# Patient Record
Sex: Female | Born: 1945 | Race: Black or African American | Hispanic: No | Smoking: Never smoker
Health system: Southern US, Community
[De-identification: ages and names within clinical notes are randomized; demographics above are authoritative.]

## PROBLEM LIST (undated history)

## (undated) DIAGNOSIS — B0089 Other herpesviral infection: Secondary | ICD-10-CM

## (undated) DIAGNOSIS — M2550 Pain in unspecified joint: Secondary | ICD-10-CM

## (undated) DIAGNOSIS — A044 Other intestinal Escherichia coli infections: Secondary | ICD-10-CM

## (undated) DIAGNOSIS — C50919 Malignant neoplasm of unspecified site of unspecified female breast: Secondary | ICD-10-CM

## (undated) DIAGNOSIS — E785 Hyperlipidemia, unspecified: Secondary | ICD-10-CM

## (undated) DIAGNOSIS — Z9289 Personal history of other medical treatment: Secondary | ICD-10-CM

## (undated) DIAGNOSIS — K219 Gastro-esophageal reflux disease without esophagitis: Secondary | ICD-10-CM

## (undated) DIAGNOSIS — I639 Cerebral infarction, unspecified: Secondary | ICD-10-CM

## (undated) DIAGNOSIS — IMO0002 Reserved for concepts with insufficient information to code with codable children: Secondary | ICD-10-CM

## (undated) DIAGNOSIS — G8929 Other chronic pain: Secondary | ICD-10-CM

## (undated) DIAGNOSIS — M889 Osteitis deformans of unspecified bone: Secondary | ICD-10-CM

## (undated) DIAGNOSIS — I10 Essential (primary) hypertension: Secondary | ICD-10-CM

## (undated) DIAGNOSIS — Z9889 Other specified postprocedural states: Secondary | ICD-10-CM

## (undated) DIAGNOSIS — M159 Polyosteoarthritis, unspecified: Secondary | ICD-10-CM

## (undated) DIAGNOSIS — IMO0001 Reserved for inherently not codable concepts without codable children: Secondary | ICD-10-CM

## (undated) DIAGNOSIS — Z923 Personal history of irradiation: Secondary | ICD-10-CM

## (undated) DIAGNOSIS — B009 Herpesviral infection, unspecified: Secondary | ICD-10-CM

## (undated) DIAGNOSIS — Z86010 Personal history of colonic polyps: Secondary | ICD-10-CM

## (undated) DIAGNOSIS — R112 Nausea with vomiting, unspecified: Secondary | ICD-10-CM

## (undated) DIAGNOSIS — N289 Disorder of kidney and ureter, unspecified: Secondary | ICD-10-CM

## (undated) DIAGNOSIS — E119 Type 2 diabetes mellitus without complications: Secondary | ICD-10-CM

## (undated) DIAGNOSIS — C9000 Multiple myeloma not having achieved remission: Secondary | ICD-10-CM

## (undated) DIAGNOSIS — E559 Vitamin D deficiency, unspecified: Secondary | ICD-10-CM

## (undated) DIAGNOSIS — M1990 Unspecified osteoarthritis, unspecified site: Secondary | ICD-10-CM

## (undated) DIAGNOSIS — D649 Anemia, unspecified: Secondary | ICD-10-CM

## (undated) DIAGNOSIS — Z8579 Personal history of other malignant neoplasms of lymphoid, hematopoietic and related tissues: Secondary | ICD-10-CM

## (undated) DIAGNOSIS — M25562 Pain in left knee: Secondary | ICD-10-CM

## (undated) HISTORY — DX: Herpesviral infection, unspecified: B00.9

## (undated) HISTORY — DX: Malignant neoplasm of unspecified site of unspecified female breast: C50.919

## (undated) HISTORY — DX: Cerebral infarction, unspecified: I63.9

## (undated) HISTORY — PX: COLONOSCOPY: SHX174

## (undated) HISTORY — PX: JOINT REPLACEMENT: SHX530

## (undated) HISTORY — DX: Multiple myeloma not having achieved remission: C90.00

## (undated) HISTORY — DX: Pain in left knee: M25.562

## (undated) HISTORY — DX: Disorder of kidney and ureter, unspecified: N28.9

## (undated) HISTORY — DX: Personal history of irradiation: Z92.3

## (undated) HISTORY — DX: Essential (primary) hypertension: I10

## (undated) HISTORY — DX: Polyosteoarthritis, unspecified: M15.9

## (undated) HISTORY — DX: Osteitis deformans of unspecified bone: M88.9

## (undated) HISTORY — DX: Gastro-esophageal reflux disease without esophagitis: K21.9

## (undated) HISTORY — DX: Other herpesviral infection: B00.89

## (undated) HISTORY — DX: Vitamin D deficiency, unspecified: E55.9

## (undated) HISTORY — DX: Personal history of other medical treatment: Z92.89

## (undated) HISTORY — DX: Pain in unspecified joint: M25.50

## (undated) HISTORY — DX: Personal history of other malignant neoplasms of lymphoid, hematopoietic and related tissues: Z85.79

## (undated) HISTORY — DX: Unspecified osteoarthritis, unspecified site: M19.90

## (undated) HISTORY — DX: Type 2 diabetes mellitus without complications: E11.9

## (undated) HISTORY — DX: Anemia, unspecified: D64.9

## (undated) HISTORY — DX: Other chronic pain: G89.29

## (undated) HISTORY — DX: Personal history of colonic polyps: Z86.010

## (undated) HISTORY — DX: Other intestinal Escherichia coli infections: A04.4

## (undated) HISTORY — DX: Hyperlipidemia, unspecified: E78.5

---

## 1997-11-02 ENCOUNTER — Emergency Department (HOSPITAL_COMMUNITY): Admission: EM | Admit: 1997-11-02 | Discharge: 1997-11-02 | Payer: Self-pay | Admitting: Emergency Medicine

## 1998-10-13 ENCOUNTER — Emergency Department (HOSPITAL_COMMUNITY): Admission: EM | Admit: 1998-10-13 | Discharge: 1998-10-13 | Payer: Self-pay | Admitting: Emergency Medicine

## 2000-02-13 ENCOUNTER — Other Ambulatory Visit: Admission: RE | Admit: 2000-02-13 | Discharge: 2000-02-13 | Payer: Self-pay | Admitting: Obstetrics and Gynecology

## 2003-05-23 DIAGNOSIS — I10 Essential (primary) hypertension: Secondary | ICD-10-CM

## 2003-05-23 HISTORY — DX: Essential (primary) hypertension: I10

## 2003-11-28 ENCOUNTER — Emergency Department (HOSPITAL_COMMUNITY): Admission: EM | Admit: 2003-11-28 | Discharge: 2003-11-28 | Payer: Self-pay

## 2004-01-23 ENCOUNTER — Emergency Department (HOSPITAL_COMMUNITY): Admission: EM | Admit: 2004-01-23 | Discharge: 2004-01-23 | Payer: Self-pay | Admitting: Emergency Medicine

## 2004-05-22 DIAGNOSIS — K208 Other esophagitis without bleeding: Secondary | ICD-10-CM

## 2004-05-22 DIAGNOSIS — B0089 Other herpesviral infection: Secondary | ICD-10-CM

## 2004-05-22 HISTORY — DX: Other herpesviral infection: B00.89

## 2004-05-22 HISTORY — DX: Other esophagitis without bleeding: K20.80

## 2004-09-01 ENCOUNTER — Inpatient Hospital Stay (HOSPITAL_COMMUNITY): Admission: EM | Admit: 2004-09-01 | Discharge: 2004-09-13 | Payer: Self-pay | Admitting: Emergency Medicine

## 2004-09-02 ENCOUNTER — Encounter (INDEPENDENT_AMBULATORY_CARE_PROVIDER_SITE_OTHER): Payer: Self-pay | Admitting: Specialist

## 2004-09-02 ENCOUNTER — Ambulatory Visit: Payer: Self-pay | Admitting: Internal Medicine

## 2004-09-09 ENCOUNTER — Ambulatory Visit: Payer: Self-pay | Admitting: Hematology & Oncology

## 2004-09-09 ENCOUNTER — Encounter (INDEPENDENT_AMBULATORY_CARE_PROVIDER_SITE_OTHER): Payer: Self-pay | Admitting: *Deleted

## 2004-09-14 ENCOUNTER — Ambulatory Visit: Payer: Self-pay | Admitting: Hematology & Oncology

## 2004-09-24 ENCOUNTER — Encounter (INDEPENDENT_AMBULATORY_CARE_PROVIDER_SITE_OTHER): Payer: Self-pay | Admitting: Specialist

## 2004-09-24 ENCOUNTER — Inpatient Hospital Stay (HOSPITAL_COMMUNITY): Admission: EM | Admit: 2004-09-24 | Discharge: 2004-09-30 | Payer: Self-pay | Admitting: Emergency Medicine

## 2004-11-01 ENCOUNTER — Ambulatory Visit: Payer: Self-pay | Admitting: Hematology & Oncology

## 2004-12-05 ENCOUNTER — Emergency Department (HOSPITAL_COMMUNITY): Admission: EM | Admit: 2004-12-05 | Discharge: 2004-12-05 | Payer: Self-pay | Admitting: Emergency Medicine

## 2004-12-12 ENCOUNTER — Ambulatory Visit (HOSPITAL_COMMUNITY): Admission: RE | Admit: 2004-12-12 | Discharge: 2004-12-12 | Payer: Self-pay | Admitting: Hematology & Oncology

## 2004-12-19 ENCOUNTER — Ambulatory Visit: Payer: Self-pay | Admitting: Hematology & Oncology

## 2004-12-23 ENCOUNTER — Ambulatory Visit: Admission: RE | Admit: 2004-12-23 | Discharge: 2004-12-23 | Payer: Self-pay | Admitting: Hematology & Oncology

## 2004-12-24 ENCOUNTER — Emergency Department (HOSPITAL_COMMUNITY): Admission: EM | Admit: 2004-12-24 | Discharge: 2004-12-24 | Payer: Self-pay | Admitting: Emergency Medicine

## 2004-12-27 ENCOUNTER — Encounter (INDEPENDENT_AMBULATORY_CARE_PROVIDER_SITE_OTHER): Payer: Self-pay | Admitting: *Deleted

## 2004-12-27 ENCOUNTER — Ambulatory Visit (HOSPITAL_COMMUNITY): Admission: RE | Admit: 2004-12-27 | Discharge: 2004-12-27 | Payer: Self-pay | Admitting: Hematology & Oncology

## 2005-01-01 ENCOUNTER — Ambulatory Visit: Payer: Self-pay | Admitting: Hematology & Oncology

## 2005-01-20 HISTORY — PX: BONE MARROW TRANSPLANT: SHX200

## 2005-03-13 ENCOUNTER — Ambulatory Visit: Payer: Self-pay | Admitting: Hematology & Oncology

## 2005-03-14 ENCOUNTER — Inpatient Hospital Stay (HOSPITAL_COMMUNITY): Admission: EM | Admit: 2005-03-14 | Discharge: 2005-03-22 | Payer: Self-pay | Admitting: Oncology

## 2005-03-18 ENCOUNTER — Ambulatory Visit: Payer: Self-pay | Admitting: Oncology

## 2005-04-28 ENCOUNTER — Ambulatory Visit: Payer: Self-pay | Admitting: Hematology & Oncology

## 2005-05-02 ENCOUNTER — Ambulatory Visit: Payer: Self-pay | Admitting: Hematology & Oncology

## 2005-05-02 ENCOUNTER — Encounter (INDEPENDENT_AMBULATORY_CARE_PROVIDER_SITE_OTHER): Payer: Self-pay | Admitting: *Deleted

## 2005-05-02 ENCOUNTER — Ambulatory Visit (HOSPITAL_COMMUNITY): Admission: RE | Admit: 2005-05-02 | Discharge: 2005-05-02 | Payer: Self-pay | Admitting: Hematology & Oncology

## 2005-07-05 ENCOUNTER — Ambulatory Visit: Payer: Self-pay | Admitting: Hematology & Oncology

## 2005-08-30 ENCOUNTER — Ambulatory Visit: Payer: Self-pay | Admitting: Hematology & Oncology

## 2005-08-31 ENCOUNTER — Ambulatory Visit (HOSPITAL_COMMUNITY): Admission: RE | Admit: 2005-08-31 | Discharge: 2005-08-31 | Payer: Self-pay | Admitting: Hematology & Oncology

## 2005-08-31 LAB — CBC WITH DIFFERENTIAL/PLATELET
BASO%: 0.1 % (ref 0.0–2.0)
EOS%: 2.9 % (ref 0.0–7.0)
LYMPH%: 21.1 % (ref 14.0–48.0)
MCHC: 34.2 g/dL (ref 32.0–36.0)
MCV: 93.5 fL (ref 81.0–101.0)
MONO%: 9.2 % (ref 0.0–13.0)
Platelets: 205 10*3/uL (ref 145–400)
RBC: 3.86 10*6/uL (ref 3.70–5.32)
RDW: 13.5 % (ref 11.3–14.5)
WBC: 3.9 10*3/uL (ref 3.9–10.0)

## 2005-09-01 LAB — COMPREHENSIVE METABOLIC PANEL
ALT: 46 U/L — ABNORMAL HIGH (ref 0–40)
AST: 37 U/L (ref 0–37)
Alkaline Phosphatase: 98 U/L (ref 39–117)
Sodium: 141 mEq/L (ref 135–145)
Total Bilirubin: 0.3 mg/dL (ref 0.3–1.2)
Total Protein: 7.8 g/dL (ref 6.0–8.3)

## 2005-09-01 LAB — PROTEIN ELECTROPHORESIS, SERUM
Albumin ELP: 57.9 % (ref 55.8–66.1)
Alpha-1-Globulin: 4.9 % (ref 2.9–4.9)
Alpha-2-Globulin: 13.9 % — ABNORMAL HIGH (ref 7.1–11.8)
Beta 2: 4.4 % (ref 3.2–6.5)
Beta Globulin: 4.4 % — ABNORMAL LOW (ref 4.7–7.2)

## 2005-09-01 LAB — IGG, IGA, IGM: IgA: 124 mg/dL (ref 68–378)

## 2005-09-05 ENCOUNTER — Encounter (INDEPENDENT_AMBULATORY_CARE_PROVIDER_SITE_OTHER): Payer: Self-pay | Admitting: Cardiology

## 2005-09-05 ENCOUNTER — Ambulatory Visit: Admission: RE | Admit: 2005-09-05 | Discharge: 2005-09-05 | Payer: Self-pay | Admitting: Hematology & Oncology

## 2005-09-14 LAB — CBC WITH DIFFERENTIAL/PLATELET
Eosinophils Absolute: 0.2 10*3/uL (ref 0.0–0.5)
MONO#: 0.5 10*3/uL (ref 0.1–0.9)
NEUT#: 1.9 10*3/uL (ref 1.5–6.5)
RBC: 3.49 10*6/uL — ABNORMAL LOW (ref 3.70–5.32)
RDW: 14.1 % (ref 11.3–14.5)
WBC: 3.6 10*3/uL — ABNORMAL LOW (ref 3.9–10.0)

## 2005-09-22 LAB — CBC WITH DIFFERENTIAL/PLATELET
Basophils Absolute: 0.1 10*3/uL (ref 0.0–0.1)
EOS%: 4.5 % (ref 0.0–7.0)
Eosinophils Absolute: 0.2 10*3/uL (ref 0.0–0.5)
LYMPH%: 32.1 % (ref 14.0–48.0)
MCH: 32.4 pg (ref 26.0–34.0)
MCV: 95.6 fL (ref 81.0–101.0)
MONO%: 15 % — ABNORMAL HIGH (ref 0.0–13.0)
NEUT#: 2 10*3/uL (ref 1.5–6.5)
Platelets: 255 10*3/uL (ref 145–400)
RBC: 3.95 10*6/uL (ref 3.70–5.32)

## 2005-10-12 LAB — CBC WITH DIFFERENTIAL/PLATELET
BASO%: 0.8 % (ref 0.0–2.0)
EOS%: 5.4 % (ref 0.0–7.0)
LYMPH%: 29.2 % (ref 14.0–48.0)
MCHC: 33.6 g/dL (ref 32.0–36.0)
MCV: 94.7 fL (ref 81.0–101.0)
MONO%: 11.9 % (ref 0.0–13.0)
Platelets: 277 10*3/uL (ref 145–400)
RBC: 4.2 10*6/uL (ref 3.70–5.32)
WBC: 4.3 10*3/uL (ref 3.9–10.0)

## 2005-10-14 ENCOUNTER — Ambulatory Visit: Payer: Self-pay | Admitting: Hematology & Oncology

## 2005-10-17 LAB — COMPREHENSIVE METABOLIC PANEL
ALT: 20 U/L (ref 0–40)
AST: 23 U/L (ref 0–37)
Alkaline Phosphatase: 109 U/L (ref 39–117)
Sodium: 138 mEq/L (ref 135–145)
Total Bilirubin: 0.3 mg/dL (ref 0.3–1.2)
Total Protein: 8 g/dL (ref 6.0–8.3)

## 2005-10-17 LAB — PROTEIN ELECTROPHORESIS, SERUM
Albumin ELP: 58.9 % (ref 55.8–66.1)
M-Spike, %: 0.33 g/dL
Total Protein, Serum Electrophoresis: 8 g/dL (ref 6.0–8.3)

## 2005-10-17 LAB — KAPPA/LAMBDA LIGHT CHAINS
Kappa free light chain: 1.87 mg/dL (ref 0.33–1.94)
Kappa:Lambda Ratio: 1.89 — ABNORMAL HIGH (ref 0.26–1.65)
Lambda Free Lght Chn: 0.99 mg/dL (ref 0.57–2.63)

## 2005-10-17 LAB — FERRITIN: Ferritin: 430 ng/mL — ABNORMAL HIGH (ref 10–291)

## 2005-10-17 LAB — BETA 2 MICROGLOBULIN, SERUM: Beta-2 Microglobulin: 3.55 mg/L — ABNORMAL HIGH (ref 1.01–1.73)

## 2005-10-19 LAB — BASIC METABOLIC PANEL
BUN: 26 mg/dL — ABNORMAL HIGH (ref 6–23)
Calcium: 9.1 mg/dL (ref 8.4–10.5)
Creatinine, Ser: 1.5 mg/dL — ABNORMAL HIGH (ref 0.4–1.2)
Glucose, Bld: 106 mg/dL — ABNORMAL HIGH (ref 70–99)
Potassium: 3.6 mEq/L (ref 3.5–5.3)

## 2005-11-17 LAB — CBC WITH DIFFERENTIAL/PLATELET
Basophils Absolute: 0 10*3/uL (ref 0.0–0.1)
Eosinophils Absolute: 0.3 10*3/uL (ref 0.0–0.5)
HCT: 35.9 % (ref 34.8–46.6)
HGB: 12.3 g/dL (ref 11.6–15.9)
LYMPH%: 36.1 % (ref 14.0–48.0)
MCHC: 34.1 g/dL (ref 32.0–36.0)
MONO#: 0.8 10*3/uL (ref 0.1–0.9)
NEUT%: 39 % — ABNORMAL LOW (ref 39.6–76.8)
Platelets: 286 10*3/uL (ref 145–400)
WBC: 4.4 10*3/uL (ref 3.9–10.0)
lymph#: 1.6 10*3/uL (ref 0.9–3.3)

## 2005-11-20 LAB — PROTEIN ELECTROPHORESIS, SERUM
Albumin ELP: 57.8 % (ref 55.8–66.1)
Beta Globulin: 5.3 % (ref 4.7–7.2)
M-Spike, %: 0.26 g/dL
Total Protein, Serum Electrophoresis: 7.5 g/dL (ref 6.0–8.3)

## 2005-11-20 LAB — COMPREHENSIVE METABOLIC PANEL
AST: 17 U/L (ref 0–37)
Albumin: 4.5 g/dL (ref 3.5–5.2)
BUN: 23 mg/dL (ref 6–23)
CO2: 26 mEq/L (ref 19–32)
Calcium: 9.3 mg/dL (ref 8.4–10.5)
Chloride: 105 mEq/L (ref 96–112)
Creatinine, Ser: 1.45 mg/dL — ABNORMAL HIGH (ref 0.40–1.20)
Glucose, Bld: 106 mg/dL — ABNORMAL HIGH (ref 70–99)
Potassium: 4.4 mEq/L (ref 3.5–5.3)

## 2005-11-20 LAB — BETA 2 MICROGLOBULIN, SERUM: Beta-2 Microglobulin: 3.02 mg/L — ABNORMAL HIGH (ref 1.01–1.73)

## 2005-11-20 LAB — FERRITIN: Ferritin: 401 ng/mL — ABNORMAL HIGH (ref 10–291)

## 2005-11-20 LAB — KAPPA/LAMBDA LIGHT CHAINS: Lambda Free Lght Chn: 1.05 mg/dL (ref 0.57–2.63)

## 2005-12-11 ENCOUNTER — Ambulatory Visit: Payer: Self-pay | Admitting: Hematology & Oncology

## 2005-12-14 LAB — CBC WITH DIFFERENTIAL/PLATELET
BASO%: 1.2 % (ref 0.0–2.0)
EOS%: 4.5 % (ref 0.0–7.0)
HCT: 34.4 % — ABNORMAL LOW (ref 34.8–46.6)
LYMPH%: 31.9 % (ref 14.0–48.0)
MCH: 31.6 pg (ref 26.0–34.0)
MCHC: 34.2 g/dL (ref 32.0–36.0)
MCV: 92.4 fL (ref 81.0–101.0)
MONO#: 0.8 10*3/uL (ref 0.1–0.9)
MONO%: 16.7 % — ABNORMAL HIGH (ref 0.0–13.0)
NEUT%: 45.7 % (ref 39.6–76.8)
Platelets: 268 10*3/uL (ref 145–400)

## 2006-01-11 ENCOUNTER — Ambulatory Visit: Payer: Self-pay | Admitting: Hematology & Oncology

## 2006-01-11 LAB — BASIC METABOLIC PANEL
BUN: 26 mg/dL — ABNORMAL HIGH (ref 6–23)
Calcium: 9.7 mg/dL (ref 8.4–10.5)
Potassium: 3.5 mEq/L (ref 3.5–5.3)
Sodium: 137 mEq/L (ref 135–145)

## 2006-02-08 LAB — BASIC METABOLIC PANEL
CO2: 27 mEq/L (ref 19–32)
Glucose, Bld: 111 mg/dL — ABNORMAL HIGH (ref 70–99)
Potassium: 3.9 mEq/L (ref 3.5–5.3)
Sodium: 140 mEq/L (ref 135–145)

## 2006-02-08 LAB — CBC WITH DIFFERENTIAL/PLATELET
BASO%: 1.3 % (ref 0.0–2.0)
Basophils Absolute: 0.1 10*3/uL (ref 0.0–0.1)
HCT: 33 % — ABNORMAL LOW (ref 34.8–46.6)
HGB: 11.2 g/dL — ABNORMAL LOW (ref 11.6–15.9)
LYMPH%: 32.6 % (ref 14.0–48.0)
MCHC: 34.1 g/dL (ref 32.0–36.0)
MONO#: 0.6 10*3/uL (ref 0.1–0.9)
NEUT%: 47.2 % (ref 39.6–76.8)
Platelets: 288 10*3/uL (ref 145–400)
WBC: 4.7 10*3/uL (ref 3.9–10.0)

## 2006-02-16 LAB — CBC WITH DIFFERENTIAL/PLATELET
BASO%: 0.1 % (ref 0.0–2.0)
EOS%: 4.4 % (ref 0.0–7.0)
HCT: 31.9 % — ABNORMAL LOW (ref 34.8–46.6)
LYMPH%: 28.3 % (ref 14.0–48.0)
MCH: 31.9 pg (ref 26.0–34.0)
MCHC: 34.3 g/dL (ref 32.0–36.0)
NEUT%: 57.1 % (ref 39.6–76.8)
Platelets: ADEQUATE 10*3/uL (ref 145–400)
RBC: 3.42 10*6/uL — ABNORMAL LOW (ref 3.70–5.32)
WBC: 4.3 10*3/uL (ref 3.9–10.0)
lymph#: 1.2 10*3/uL (ref 0.9–3.3)

## 2006-02-19 LAB — BETA 2 MICROGLOBULIN, SERUM: Beta-2 Microglobulin: 2.93 mg/L — ABNORMAL HIGH (ref 1.01–1.73)

## 2006-02-19 LAB — COMPREHENSIVE METABOLIC PANEL
ALT: 15 U/L (ref 0–40)
AST: 16 U/L (ref 0–37)
Alkaline Phosphatase: 64 U/L (ref 39–117)
BUN: 31 mg/dL — ABNORMAL HIGH (ref 6–23)
Creatinine, Ser: 1.86 mg/dL — ABNORMAL HIGH (ref 0.40–1.20)

## 2006-02-19 LAB — PROTEIN ELECTROPHORESIS, SERUM
Alpha-1-Globulin: 4.1 % (ref 2.9–4.9)
Alpha-2-Globulin: 11.7 % (ref 7.1–11.8)
Beta 2: 4.7 % (ref 3.2–6.5)
Beta Globulin: 5.3 % (ref 4.7–7.2)
Gamma Globulin: 14.7 % (ref 11.1–18.8)

## 2006-04-17 ENCOUNTER — Ambulatory Visit: Payer: Self-pay | Admitting: Hematology & Oncology

## 2006-04-20 LAB — CBC WITH DIFFERENTIAL/PLATELET
BASO%: 0.9 % (ref 0.0–2.0)
Eosinophils Absolute: 0.3 10*3/uL (ref 0.0–0.5)
LYMPH%: 28.5 % (ref 14.0–48.0)
MCHC: 34.4 g/dL (ref 32.0–36.0)
MCV: 92.6 fL (ref 81.0–101.0)
MONO%: 9.6 % (ref 0.0–13.0)
NEUT#: 2.7 10*3/uL (ref 1.5–6.5)
RBC: 3.62 10*6/uL — ABNORMAL LOW (ref 3.70–5.32)
RDW: 13.6 % (ref 11.3–14.5)
WBC: 4.9 10*3/uL (ref 3.9–10.0)

## 2006-04-25 LAB — COMPREHENSIVE METABOLIC PANEL
ALT: 27 U/L (ref 0–35)
AST: 22 U/L (ref 0–37)
Albumin: 4.6 g/dL (ref 3.5–5.2)
Alkaline Phosphatase: 93 U/L (ref 39–117)
Glucose, Bld: 109 mg/dL — ABNORMAL HIGH (ref 70–99)
Potassium: 3.8 mEq/L (ref 3.5–5.3)
Sodium: 141 mEq/L (ref 135–145)
Total Bilirubin: 0.3 mg/dL (ref 0.3–1.2)
Total Protein: 8.1 g/dL (ref 6.0–8.3)

## 2006-04-25 LAB — IGG, IGA, IGM: IgM, Serum: 64 mg/dL (ref 60–263)

## 2006-04-25 LAB — PROTEIN ELECTROPHORESIS, SERUM
Alpha-1-Globulin: 6 % — ABNORMAL HIGH (ref 2.9–4.9)
Alpha-2-Globulin: 14.7 % — ABNORMAL HIGH (ref 7.1–11.8)
Total Protein, Serum Electrophoresis: 8.1 g/dL (ref 6.0–8.3)

## 2006-04-25 LAB — KAPPA/LAMBDA LIGHT CHAINS
Kappa free light chain: 1.8 mg/dL (ref 0.33–1.94)
Lambda Free Lght Chn: 0.93 mg/dL (ref 0.57–2.63)

## 2006-07-17 ENCOUNTER — Ambulatory Visit: Payer: Self-pay | Admitting: Hematology & Oncology

## 2006-07-20 ENCOUNTER — Ambulatory Visit (HOSPITAL_COMMUNITY): Admission: RE | Admit: 2006-07-20 | Discharge: 2006-07-20 | Payer: Self-pay | Admitting: Hematology & Oncology

## 2006-07-20 LAB — CBC WITH DIFFERENTIAL/PLATELET
Eosinophils Absolute: 0.1 10*3/uL (ref 0.0–0.5)
HCT: 33.3 % — ABNORMAL LOW (ref 34.8–46.6)
HGB: 11.4 g/dL — ABNORMAL LOW (ref 11.6–15.9)
LYMPH%: 26.4 % (ref 14.0–48.0)
MONO#: 0.5 10*3/uL (ref 0.1–0.9)
NEUT#: 2.5 10*3/uL (ref 1.5–6.5)
NEUT%: 58.1 % (ref 39.6–76.8)
Platelets: 360 10*3/uL (ref 145–400)
WBC: 4.3 10*3/uL (ref 3.9–10.0)

## 2006-07-24 LAB — PROTEIN ELECTROPHORESIS, SERUM
Albumin ELP: 56.8 % (ref 55.8–66.1)
Beta 2: 5.1 % (ref 3.2–6.5)
M-Spike, %: 0.34 g/dL

## 2006-07-24 LAB — IGG, IGA, IGM
IgA: 174 mg/dL (ref 68–378)
IgG (Immunoglobin G), Serum: 1410 mg/dL (ref 694–1618)

## 2006-07-24 LAB — COMPREHENSIVE METABOLIC PANEL
CO2: 24 mEq/L (ref 19–32)
Creatinine, Ser: 1.36 mg/dL — ABNORMAL HIGH (ref 0.40–1.20)
Glucose, Bld: 98 mg/dL (ref 70–99)
Sodium: 141 mEq/L (ref 135–145)
Total Bilirubin: 0.3 mg/dL (ref 0.3–1.2)
Total Protein: 7.7 g/dL (ref 6.0–8.3)

## 2006-08-30 LAB — CBC WITH DIFFERENTIAL/PLATELET
Basophils Absolute: 0.1 10*3/uL (ref 0.0–0.1)
EOS%: 3.3 % (ref 0.0–7.0)
Eosinophils Absolute: 0.2 10*3/uL (ref 0.0–0.5)
LYMPH%: 25.1 % (ref 14.0–48.0)
MCH: 31.8 pg (ref 26.0–34.0)
MCV: 92.3 fL (ref 81.0–101.0)
MONO%: 10.1 % (ref 0.0–13.0)
NEUT#: 3.1 10*3/uL (ref 1.5–6.5)
Platelets: 311 10*3/uL (ref 145–400)
RBC: 3.73 10*6/uL (ref 3.70–5.32)

## 2006-08-30 LAB — BASIC METABOLIC PANEL
BUN: 24 mg/dL — ABNORMAL HIGH (ref 6–23)
Calcium: 9.1 mg/dL (ref 8.4–10.5)
Glucose, Bld: 92 mg/dL (ref 70–99)
Sodium: 139 mEq/L (ref 135–145)

## 2006-09-20 ENCOUNTER — Emergency Department (HOSPITAL_COMMUNITY): Admission: EM | Admit: 2006-09-20 | Discharge: 2006-09-20 | Payer: Self-pay | Admitting: Emergency Medicine

## 2006-09-21 ENCOUNTER — Ambulatory Visit: Payer: Self-pay | Admitting: Hematology & Oncology

## 2006-09-21 ENCOUNTER — Ambulatory Visit (HOSPITAL_COMMUNITY): Admission: RE | Admit: 2006-09-21 | Discharge: 2006-09-21 | Payer: Self-pay | Admitting: Hematology & Oncology

## 2006-09-21 LAB — CBC WITH DIFFERENTIAL/PLATELET
BASO%: 0.3 % (ref 0.0–2.0)
MCHC: 34.8 g/dL (ref 32.0–36.0)
MONO#: 0.5 10*3/uL (ref 0.1–0.9)
RBC: 4.06 10*6/uL (ref 3.70–5.32)
WBC: 6.2 10*3/uL (ref 3.9–10.0)
lymph#: 0.6 10*3/uL — ABNORMAL LOW (ref 0.9–3.3)

## 2006-09-24 ENCOUNTER — Ambulatory Visit: Admission: RE | Admit: 2006-09-24 | Discharge: 2006-10-19 | Payer: Self-pay | Admitting: *Deleted

## 2006-09-25 LAB — PROTEIN ELECTROPHORESIS, SERUM
Alpha-1-Globulin: 5.4 % — ABNORMAL HIGH (ref 2.9–4.9)
Alpha-2-Globulin: 13.5 % — ABNORMAL HIGH (ref 7.1–11.8)
Beta 2: 5.5 % (ref 3.2–6.5)
Gamma Globulin: 13.7 % (ref 11.1–18.8)

## 2006-09-25 LAB — KAPPA/LAMBDA LIGHT CHAINS
Kappa:Lambda Ratio: 1.65 (ref 0.26–1.65)
Lambda Free Lght Chn: 1.01 mg/dL (ref 0.57–2.63)

## 2006-09-25 LAB — COMPREHENSIVE METABOLIC PANEL
ALT: 18 U/L (ref 0–35)
CO2: 26 mEq/L (ref 19–32)
Calcium: 9.8 mg/dL (ref 8.4–10.5)
Chloride: 105 mEq/L (ref 96–112)
Potassium: 4.2 mEq/L (ref 3.5–5.3)
Sodium: 142 mEq/L (ref 135–145)
Total Protein: 8 g/dL (ref 6.0–8.3)

## 2006-09-25 LAB — BETA 2 MICROGLOBULIN, SERUM: Beta-2 Microglobulin: 2.41 mg/L — ABNORMAL HIGH (ref 1.01–1.73)

## 2006-09-25 LAB — LACTATE DEHYDROGENASE: LDH: 186 U/L (ref 94–250)

## 2006-09-26 LAB — BASIC METABOLIC PANEL
CO2: 23 mEq/L (ref 19–32)
Calcium: 9.4 mg/dL (ref 8.4–10.5)
Sodium: 142 mEq/L (ref 135–145)

## 2006-10-02 ENCOUNTER — Ambulatory Visit: Payer: Self-pay | Admitting: Hematology & Oncology

## 2006-10-02 ENCOUNTER — Encounter: Payer: Self-pay | Admitting: Hematology & Oncology

## 2006-10-02 ENCOUNTER — Ambulatory Visit (HOSPITAL_COMMUNITY): Admission: RE | Admit: 2006-10-02 | Discharge: 2006-10-02 | Payer: Self-pay | Admitting: Hematology & Oncology

## 2006-10-12 LAB — CBC WITH DIFFERENTIAL/PLATELET
Basophils Absolute: 0 10*3/uL (ref 0.0–0.1)
EOS%: 4.4 % (ref 0.0–7.0)
Eosinophils Absolute: 0.2 10*3/uL (ref 0.0–0.5)
HCT: 35.1 % (ref 34.8–46.6)
HGB: 12.2 g/dL (ref 11.6–15.9)
MCH: 31.7 pg (ref 26.0–34.0)
MONO#: 0.2 10*3/uL (ref 0.1–0.9)
NEUT#: 3.3 10*3/uL (ref 1.5–6.5)
NEUT%: 73.8 % (ref 39.6–76.8)
lymph#: 0.8 10*3/uL — ABNORMAL LOW (ref 0.9–3.3)

## 2006-10-17 LAB — COMPREHENSIVE METABOLIC PANEL
ALT: 13 U/L (ref 0–35)
CO2: 23 mEq/L (ref 19–32)
Calcium: 9.7 mg/dL (ref 8.4–10.5)
Chloride: 106 mEq/L (ref 96–112)
Creatinine, Ser: 1.55 mg/dL — ABNORMAL HIGH (ref 0.40–1.20)
Total Protein: 7.8 g/dL (ref 6.0–8.3)

## 2006-10-17 LAB — PROTEIN ELECTROPHORESIS, SERUM
Albumin ELP: 57.8 % (ref 55.8–66.1)
Alpha-1-Globulin: 4.9 % (ref 2.9–4.9)
Alpha-2-Globulin: 12.9 % — ABNORMAL HIGH (ref 7.1–11.8)
Total Protein, Serum Electrophoresis: 7.8 g/dL (ref 6.0–8.3)

## 2006-10-17 LAB — KAPPA/LAMBDA LIGHT CHAINS: Lambda Free Lght Chn: 1.67 mg/dL (ref 0.57–2.63)

## 2006-10-17 LAB — LACTATE DEHYDROGENASE: LDH: 148 U/L (ref 94–250)

## 2006-10-17 LAB — IGG, IGA, IGM: IgM, Serum: 98 mg/dL (ref 60–263)

## 2006-10-24 LAB — BASIC METABOLIC PANEL
CO2: 23 mEq/L (ref 19–32)
Chloride: 105 mEq/L (ref 96–112)
Glucose, Bld: 92 mg/dL (ref 70–99)
Potassium: 4.3 mEq/L (ref 3.5–5.3)
Sodium: 140 mEq/L (ref 135–145)

## 2006-11-15 ENCOUNTER — Ambulatory Visit: Payer: Self-pay | Admitting: Hematology & Oncology

## 2006-11-21 LAB — BASIC METABOLIC PANEL
CO2: 27 mEq/L (ref 19–32)
Calcium: 9.4 mg/dL (ref 8.4–10.5)
Creatinine, Ser: 1.2 mg/dL (ref 0.40–1.20)
Glucose, Bld: 101 mg/dL — ABNORMAL HIGH (ref 70–99)

## 2006-12-19 LAB — BASIC METABOLIC PANEL
BUN: 23 mg/dL (ref 6–23)
Calcium: 8.7 mg/dL (ref 8.4–10.5)
Creatinine, Ser: 1.37 mg/dL — ABNORMAL HIGH (ref 0.40–1.20)

## 2007-01-13 ENCOUNTER — Ambulatory Visit: Payer: Self-pay | Admitting: Hematology & Oncology

## 2007-02-13 LAB — BASIC METABOLIC PANEL
Chloride: 107 mEq/L (ref 96–112)
Creatinine, Ser: 1.31 mg/dL — ABNORMAL HIGH (ref 0.40–1.20)
Potassium: 3.8 mEq/L (ref 3.5–5.3)

## 2007-03-11 ENCOUNTER — Ambulatory Visit: Payer: Self-pay | Admitting: Hematology & Oncology

## 2007-03-13 LAB — BASIC METABOLIC PANEL
BUN: 22 mg/dL (ref 6–23)
Chloride: 105 mEq/L (ref 96–112)
Glucose, Bld: 80 mg/dL (ref 70–99)
Potassium: 4.3 mEq/L (ref 3.5–5.3)
Sodium: 140 mEq/L (ref 135–145)

## 2007-04-10 LAB — COMPREHENSIVE METABOLIC PANEL
BUN: 16 mg/dL (ref 6–23)
CO2: 26 mEq/L (ref 19–32)
Creatinine, Ser: 1.88 mg/dL — ABNORMAL HIGH (ref 0.40–1.20)
Glucose, Bld: 109 mg/dL — ABNORMAL HIGH (ref 70–99)
Total Bilirubin: 0.5 mg/dL (ref 0.3–1.2)

## 2007-04-10 LAB — CBC WITH DIFFERENTIAL/PLATELET
BASO%: 0.1 % (ref 0.0–2.0)
Basophils Absolute: 0 10*3/uL (ref 0.0–0.1)
EOS%: 3.4 % (ref 0.0–7.0)
HGB: 12.1 g/dL (ref 11.6–15.9)
MCH: 31.8 pg (ref 26.0–34.0)
RDW: 14.5 % (ref 11.3–14.5)
WBC: 4.6 10*3/uL (ref 3.9–10.0)
lymph#: 0.8 10*3/uL — ABNORMAL LOW (ref 0.9–3.3)

## 2007-04-12 LAB — PROTEIN ELECTROPHORESIS, SERUM
Alpha-1-Globulin: 4.4 % (ref 2.9–4.9)
Gamma Globulin: 14.5 % (ref 11.1–18.8)

## 2007-04-12 LAB — IGG, IGA, IGM
IgA: 201 mg/dL (ref 68–378)
IgM, Serum: 70 mg/dL (ref 60–263)

## 2007-04-12 LAB — BETA 2 MICROGLOBULIN, SERUM: Beta-2 Microglobulin: 2.31 mg/L — ABNORMAL HIGH (ref 1.01–1.73)

## 2007-04-15 ENCOUNTER — Emergency Department (HOSPITAL_COMMUNITY): Admission: EM | Admit: 2007-04-15 | Discharge: 2007-04-16 | Payer: Self-pay | Admitting: Emergency Medicine

## 2007-05-06 ENCOUNTER — Ambulatory Visit: Payer: Self-pay | Admitting: Hematology & Oncology

## 2007-05-08 ENCOUNTER — Encounter: Admission: RE | Admit: 2007-05-08 | Discharge: 2007-05-08 | Payer: Self-pay | Admitting: Internal Medicine

## 2007-06-07 ENCOUNTER — Other Ambulatory Visit: Admission: RE | Admit: 2007-06-07 | Discharge: 2007-06-07 | Payer: Self-pay | Admitting: Obstetrics and Gynecology

## 2007-07-08 ENCOUNTER — Ambulatory Visit: Payer: Self-pay | Admitting: Hematology & Oncology

## 2007-07-10 LAB — COMPREHENSIVE METABOLIC PANEL
ALT: 15 U/L (ref 0–35)
Albumin: 3.8 g/dL (ref 3.5–5.2)
Alkaline Phosphatase: 78 U/L (ref 39–117)
CO2: 26 mEq/L (ref 19–32)
Glucose, Bld: 105 mg/dL — ABNORMAL HIGH (ref 70–99)
Potassium: 3.9 mEq/L (ref 3.5–5.3)
Sodium: 140 mEq/L (ref 135–145)
Total Protein: 7 g/dL (ref 6.0–8.3)

## 2007-07-10 LAB — CBC WITH DIFFERENTIAL/PLATELET
BASO%: 0.4 % (ref 0.0–2.0)
Eosinophils Absolute: 0.2 10*3/uL (ref 0.0–0.5)
HCT: 36.2 % (ref 34.8–46.6)
HGB: 11.3 g/dL — ABNORMAL LOW (ref 11.6–15.9)
MCV: 90.7 fL (ref 81.0–101.0)
MONO#: 0.5 10*3/uL (ref 0.1–0.9)
RBC: 3.98 10*6/uL (ref 3.70–5.32)
RDW: 14.7 % — ABNORMAL HIGH (ref 11.3–14.5)
lymph#: 1.1 10*3/uL (ref 0.9–3.3)

## 2007-07-12 LAB — PROTEIN ELECTROPHORESIS, SERUM
Albumin ELP: 57.1 % (ref 55.8–66.1)
Total Protein, Serum Electrophoresis: 7.7 g/dL (ref 6.0–8.3)

## 2007-07-12 LAB — IGG, IGA, IGM
IgA: 217 mg/dL (ref 68–378)
IgG (Immunoglobin G), Serum: 1100 mg/dL (ref 694–1618)
IgM, Serum: 66 mg/dL (ref 60–263)

## 2007-07-12 LAB — BETA 2 MICROGLOBULIN, SERUM: Beta-2 Microglobulin: 2.46 mg/L — ABNORMAL HIGH (ref 1.01–1.73)

## 2007-10-03 ENCOUNTER — Ambulatory Visit: Payer: Self-pay | Admitting: Hematology & Oncology

## 2007-10-07 LAB — CBC WITH DIFFERENTIAL/PLATELET
BASO%: 0.7 % (ref 0.0–2.0)
EOS%: 3.3 % (ref 0.0–7.0)
Eosinophils Absolute: 0.2 10*3/uL (ref 0.0–0.5)
LYMPH%: 22.4 % (ref 14.0–48.0)
MCH: 30.9 pg (ref 26.0–34.0)
MCHC: 33.3 g/dL (ref 32.0–36.0)
MCV: 92.7 fL (ref 81.0–101.0)
MONO%: 10.5 % (ref 0.0–13.0)
NEUT#: 3.6 10*3/uL (ref 1.5–6.5)
RBC: 3.88 10*6/uL (ref 3.70–5.32)
RDW: 13.5 % (ref 11.3–14.5)

## 2007-10-09 LAB — IGG, IGA, IGM
IgA: 238 mg/dL (ref 68–378)
IgG (Immunoglobin G), Serum: 1260 mg/dL (ref 694–1618)

## 2007-10-09 LAB — COMPREHENSIVE METABOLIC PANEL
ALT: 16 U/L (ref 0–35)
AST: 14 U/L (ref 0–37)
Albumin: 4.5 g/dL (ref 3.5–5.2)
Alkaline Phosphatase: 87 U/L (ref 39–117)
BUN: 18 mg/dL (ref 6–23)
Creatinine, Ser: 1.38 mg/dL — ABNORMAL HIGH (ref 0.40–1.20)
Potassium: 3.9 mEq/L (ref 3.5–5.3)

## 2007-10-09 LAB — PROTEIN ELECTROPHORESIS, SERUM
Albumin ELP: 56.7 % (ref 55.8–66.1)
Alpha-1-Globulin: 4.6 % (ref 2.9–4.9)
Beta 2: 5.9 % (ref 3.2–6.5)

## 2007-10-09 LAB — BETA 2 MICROGLOBULIN, SERUM: Beta-2 Microglobulin: 2.35 mg/L — ABNORMAL HIGH (ref 1.01–1.73)

## 2008-01-02 ENCOUNTER — Ambulatory Visit: Payer: Self-pay | Admitting: Hematology & Oncology

## 2008-01-06 LAB — CBC WITH DIFFERENTIAL (CANCER CENTER ONLY)
BASO%: 0.5 % (ref 0.0–2.0)
LYMPH#: 1.2 10*3/uL (ref 0.9–3.3)
MONO#: 0.6 10*3/uL (ref 0.1–0.9)
Platelets: 381 10*3/uL (ref 145–400)
RDW: 12.4 % (ref 10.5–14.6)
WBC: 4.7 10*3/uL (ref 3.9–10.0)

## 2008-01-08 LAB — COMPREHENSIVE METABOLIC PANEL
ALT: 17 U/L (ref 0–35)
Albumin: 4.4 g/dL (ref 3.5–5.2)
Alkaline Phosphatase: 91 U/L (ref 39–117)
Glucose, Bld: 108 mg/dL — ABNORMAL HIGH (ref 70–99)
Potassium: 4.7 mEq/L (ref 3.5–5.3)
Sodium: 143 mEq/L (ref 135–145)
Total Protein: 7.7 g/dL (ref 6.0–8.3)

## 2008-01-08 LAB — KAPPA/LAMBDA LIGHT CHAINS
Kappa free light chain: 0.87 mg/dL (ref 0.33–1.94)
Kappa:Lambda Ratio: 0.66 (ref 0.26–1.65)

## 2008-01-08 LAB — IGG, IGA, IGM
IgA: 208 mg/dL (ref 68–378)
IgG (Immunoglobin G), Serum: 1280 mg/dL (ref 694–1618)

## 2008-01-08 LAB — PROTEIN ELECTROPHORESIS, SERUM
Alpha-1-Globulin: 4.7 % (ref 2.9–4.9)
Beta 2: 6.1 % (ref 3.2–6.5)
Gamma Globulin: 14.5 % (ref 11.1–18.8)

## 2008-03-27 ENCOUNTER — Ambulatory Visit: Payer: Self-pay | Admitting: Hematology & Oncology

## 2008-03-30 LAB — CBC WITH DIFFERENTIAL (CANCER CENTER ONLY)
BASO#: 0 10*3/uL (ref 0.0–0.2)
EOS%: 3.5 % (ref 0.0–7.0)
Eosinophils Absolute: 0.2 10*3/uL (ref 0.0–0.5)
HGB: 11.9 g/dL (ref 11.6–15.9)
LYMPH#: 1.1 10*3/uL (ref 0.9–3.3)
MCH: 30.3 pg (ref 26.0–34.0)
MONO%: 8.2 % (ref 0.0–13.0)
NEUT#: 3 10*3/uL (ref 1.5–6.5)
Platelets: 403 10*3/uL — ABNORMAL HIGH (ref 145–400)
RBC: 3.94 10*6/uL (ref 3.70–5.32)

## 2008-04-01 LAB — COMPREHENSIVE METABOLIC PANEL
ALT: 21 U/L (ref 0–35)
BUN: 14 mg/dL (ref 6–23)
CO2: 25 mEq/L (ref 19–32)
Calcium: 9.3 mg/dL (ref 8.4–10.5)
Chloride: 103 mEq/L (ref 96–112)
Creatinine, Ser: 1.33 mg/dL — ABNORMAL HIGH (ref 0.40–1.20)

## 2008-04-01 LAB — PROTEIN ELECTROPHORESIS, SERUM
Alpha-1-Globulin: 4.7 % (ref 2.9–4.9)
Alpha-2-Globulin: 12.2 % — ABNORMAL HIGH (ref 7.1–11.8)
Beta 2: 6.3 % (ref 3.2–6.5)
Beta Globulin: 6.6 % (ref 4.7–7.2)
Gamma Globulin: 15.9 % (ref 11.1–18.8)

## 2008-04-01 LAB — IGG, IGA, IGM: IgG (Immunoglobin G), Serum: 1420 mg/dL (ref 694–1618)

## 2008-04-01 LAB — KAPPA/LAMBDA LIGHT CHAINS
Kappa free light chain: 2.09 mg/dL — ABNORMAL HIGH (ref 0.33–1.94)
Lambda Free Lght Chn: 1.89 mg/dL (ref 0.57–2.63)

## 2008-04-01 LAB — LACTATE DEHYDROGENASE: LDH: 153 U/L (ref 94–250)

## 2008-05-22 HISTORY — PX: KNEE ARTHROSCOPY: SHX127

## 2008-06-02 ENCOUNTER — Ambulatory Visit (HOSPITAL_BASED_OUTPATIENT_CLINIC_OR_DEPARTMENT_OTHER): Admission: RE | Admit: 2008-06-02 | Discharge: 2008-06-02 | Payer: Self-pay | Admitting: Orthopaedic Surgery

## 2008-06-26 ENCOUNTER — Ambulatory Visit: Payer: Self-pay | Admitting: Hematology & Oncology

## 2008-06-29 LAB — CBC WITH DIFFERENTIAL (CANCER CENTER ONLY)
BASO%: 0.5 % (ref 0.0–2.0)
EOS%: 3.5 % (ref 0.0–7.0)
LYMPH#: 1.3 10*3/uL (ref 0.9–3.3)
MCH: 30.6 pg (ref 26.0–34.0)
MCHC: 33.4 g/dL (ref 32.0–36.0)
MONO%: 9.7 % (ref 0.0–13.0)
NEUT#: 2.7 10*3/uL (ref 1.5–6.5)
Platelets: 335 10*3/uL (ref 145–400)
RDW: 13 % (ref 10.5–14.6)

## 2008-10-09 ENCOUNTER — Ambulatory Visit: Payer: Self-pay | Admitting: Hematology & Oncology

## 2008-10-12 LAB — CBC WITH DIFFERENTIAL (CANCER CENTER ONLY)
BASO#: 0 10*3/uL (ref 0.0–0.2)
BASO%: 0.9 % (ref 0.0–2.0)
EOS%: 4.1 % (ref 0.0–7.0)
HCT: 36.2 % (ref 34.8–46.6)
LYMPH#: 1.3 10*3/uL (ref 0.9–3.3)
LYMPH%: 27.9 % (ref 14.0–48.0)
MCH: 30.7 pg (ref 26.0–34.0)
MCHC: 33.7 g/dL (ref 32.0–36.0)
MCV: 91 fL (ref 81–101)
MONO%: 11.2 % (ref 0.0–13.0)
NEUT%: 55.9 % (ref 39.6–80.0)
RDW: 12.4 % (ref 10.5–14.6)

## 2008-10-16 LAB — PROTEIN ELECTROPHORESIS, SERUM
Beta 2: 4.3 % (ref 3.2–6.5)
Gamma Globulin: 16.2 % (ref 11.1–18.8)

## 2008-10-16 LAB — COMPREHENSIVE METABOLIC PANEL
AST: 17 U/L (ref 0–37)
Albumin: 4.3 g/dL (ref 3.5–5.2)
BUN: 20 mg/dL (ref 6–23)
CO2: 24 mEq/L (ref 19–32)
Calcium: 9.8 mg/dL (ref 8.4–10.5)
Chloride: 106 mEq/L (ref 96–112)
Creatinine, Ser: 1.39 mg/dL — ABNORMAL HIGH (ref 0.40–1.20)
Glucose, Bld: 96 mg/dL (ref 70–99)
Potassium: 4.5 mEq/L (ref 3.5–5.3)

## 2008-10-16 LAB — IGG, IGA, IGM
IgA: 224 mg/dL (ref 68–378)
IgG (Immunoglobin G), Serum: 1310 mg/dL (ref 694–1618)
IgM, Serum: 76 mg/dL (ref 60–263)

## 2009-01-08 ENCOUNTER — Ambulatory Visit: Payer: Self-pay | Admitting: Hematology & Oncology

## 2009-01-11 LAB — CBC WITH DIFFERENTIAL (CANCER CENTER ONLY)
BASO#: 0.1 10e3/uL (ref 0.0–0.2)
BASO%: 1 % (ref 0.0–2.0)
EOS%: 3.8 % (ref 0.0–7.0)
Eosinophils Absolute: 0.2 10e3/uL (ref 0.0–0.5)
HCT: 35.3 % (ref 34.8–46.6)
HGB: 12.1 g/dL (ref 11.6–15.9)
LYMPH#: 1.6 10e3/uL (ref 0.9–3.3)
LYMPH%: 32.9 % (ref 14.0–48.0)
MCH: 30.8 pg (ref 26.0–34.0)
MCHC: 34.1 g/dL (ref 32.0–36.0)
MCV: 90 fL (ref 81–101)
MONO#: 0.5 10e3/uL (ref 0.1–0.9)
MONO%: 10.4 % (ref 0.0–13.0)
NEUT#: 2.5 10e3/uL (ref 1.5–6.5)
NEUT%: 51.9 % (ref 39.6–80.0)
Platelets: 351 10e3/uL (ref 145–400)
RBC: 3.91 10e6/uL (ref 3.70–5.32)
RDW: 11.8 % (ref 10.5–14.6)
WBC: 4.8 10e3/uL (ref 3.9–10.0)

## 2009-01-13 LAB — SPEP & IFE WITH QIG
Albumin ELP: 52.7 % — ABNORMAL LOW (ref 55.8–66.1)
Alpha-1-Globulin: 8.6 % — ABNORMAL HIGH (ref 2.9–4.9)
Alpha-2-Globulin: 11.5 % (ref 7.1–11.8)
Beta 2: 5.3 % (ref 3.2–6.5)
Beta Globulin: 6.1 % (ref 4.7–7.2)
Total Protein, Serum Electrophoresis: 7.1 g/dL (ref 6.0–8.3)

## 2009-01-13 LAB — COMPREHENSIVE METABOLIC PANEL
Albumin: 4.2 g/dL (ref 3.5–5.2)
Alkaline Phosphatase: 92 U/L (ref 39–117)
BUN: 18 mg/dL (ref 6–23)
CO2: 23 mEq/L (ref 19–32)
Glucose, Bld: 100 mg/dL — ABNORMAL HIGH (ref 70–99)
Potassium: 4.2 mEq/L (ref 3.5–5.3)
Sodium: 142 mEq/L (ref 135–145)
Total Bilirubin: 0.3 mg/dL (ref 0.3–1.2)
Total Protein: 7.1 g/dL (ref 6.0–8.3)

## 2009-01-13 LAB — KAPPA/LAMBDA LIGHT CHAINS
Kappa free light chain: 0.94 mg/dL (ref 0.33–1.94)
Kappa:Lambda Ratio: 0.66 (ref 0.26–1.65)
Lambda Free Lght Chn: 1.42 mg/dL (ref 0.57–2.63)

## 2009-04-23 ENCOUNTER — Ambulatory Visit: Payer: Self-pay | Admitting: Hematology & Oncology

## 2009-04-30 LAB — CBC WITH DIFFERENTIAL (CANCER CENTER ONLY)
Eosinophils Absolute: 0.1 10*3/uL (ref 0.0–0.5)
HCT: 38.4 % (ref 34.8–46.6)
LYMPH%: 27.6 % (ref 14.0–48.0)
MCV: 90 fL (ref 81–101)
MONO#: 0.5 10*3/uL (ref 0.1–0.9)
NEUT%: 60.6 % (ref 39.6–80.0)
RBC: 4.29 10*6/uL (ref 3.70–5.32)
WBC: 5.4 10*3/uL (ref 3.9–10.0)

## 2009-05-04 LAB — COMPREHENSIVE METABOLIC PANEL
AST: 13 U/L (ref 0–37)
BUN: 16 mg/dL (ref 6–23)
CO2: 23 mEq/L (ref 19–32)
Calcium: 9.1 mg/dL (ref 8.4–10.5)
Chloride: 104 mEq/L (ref 96–112)
Creatinine, Ser: 1.38 mg/dL — ABNORMAL HIGH (ref 0.40–1.20)
Glucose, Bld: 126 mg/dL — ABNORMAL HIGH (ref 70–99)

## 2009-05-04 LAB — KAPPA/LAMBDA LIGHT CHAINS
Kappa free light chain: 0.84 mg/dL (ref 0.33–1.94)
Lambda Free Lght Chn: 1.17 mg/dL (ref 0.57–2.63)

## 2009-05-04 LAB — PROTEIN ELECTROPHORESIS, SERUM
Gamma Globulin: 14.4 % (ref 11.1–18.8)
Total Protein, Serum Electrophoresis: 7.6 g/dL (ref 6.0–8.3)

## 2009-05-04 LAB — IGG, IGA, IGM
IgA: 230 mg/dL (ref 68–378)
IgG (Immunoglobin G), Serum: 1260 mg/dL (ref 694–1618)
IgM, Serum: 82 mg/dL (ref 60–263)

## 2009-08-25 ENCOUNTER — Ambulatory Visit: Payer: Self-pay | Admitting: Hematology & Oncology

## 2009-08-27 LAB — CBC WITH DIFFERENTIAL (CANCER CENTER ONLY)
BASO#: 0 10*3/uL (ref 0.0–0.2)
Eosinophils Absolute: 0.2 10*3/uL (ref 0.0–0.5)
HCT: 38.6 % (ref 34.8–46.6)
LYMPH%: 28.4 % (ref 14.0–48.0)
MCH: 30.1 pg (ref 26.0–34.0)
MCV: 92 fL (ref 81–101)
MONO%: 9.1 % (ref 0.0–13.0)
NEUT%: 57.9 % (ref 39.6–80.0)
Platelets: 350 10*3/uL (ref 145–400)
RBC: 4.2 10*6/uL (ref 3.70–5.32)

## 2009-08-31 LAB — COMPREHENSIVE METABOLIC PANEL
ALT: 28 U/L (ref 0–35)
AST: 21 U/L (ref 0–37)
Albumin: 4 g/dL (ref 3.5–5.2)
CO2: 21 mEq/L (ref 19–32)
Calcium: 8.9 mg/dL (ref 8.4–10.5)
Chloride: 107 mEq/L (ref 96–112)
Potassium: 4.2 mEq/L (ref 3.5–5.3)
Total Protein: 6.6 g/dL (ref 6.0–8.3)

## 2009-08-31 LAB — PROTEIN ELECTROPHORESIS, SERUM
Alpha-1-Globulin: 11.8 % — ABNORMAL HIGH (ref 2.9–4.9)
Alpha-2-Globulin: 12.7 % — ABNORMAL HIGH (ref 7.1–11.8)
Beta 2: 4.3 % (ref 3.2–6.5)
Beta Globulin: 7.5 % — ABNORMAL HIGH (ref 4.7–7.2)
Gamma Globulin: 13.4 % (ref 11.1–18.8)

## 2009-08-31 LAB — BETA 2 MICROGLOBULIN, SERUM: Beta-2 Microglobulin: 2.05 mg/L — ABNORMAL HIGH (ref 1.01–1.73)

## 2009-08-31 LAB — IGG, IGA, IGM: IgG (Immunoglobin G), Serum: 1000 mg/dL (ref 694–1618)

## 2009-08-31 LAB — VITAMIN D 25 HYDROXY (VIT D DEFICIENCY, FRACTURES): Vit D, 25-Hydroxy: 37 ng/mL (ref 30–89)

## 2009-10-20 ENCOUNTER — Emergency Department (HOSPITAL_COMMUNITY): Admission: EM | Admit: 2009-10-20 | Discharge: 2009-10-20 | Payer: Self-pay | Admitting: Emergency Medicine

## 2009-12-30 ENCOUNTER — Ambulatory Visit: Payer: Self-pay | Admitting: Hematology & Oncology

## 2009-12-31 ENCOUNTER — Ambulatory Visit: Payer: Self-pay | Admitting: Radiology

## 2009-12-31 ENCOUNTER — Ambulatory Visit (HOSPITAL_BASED_OUTPATIENT_CLINIC_OR_DEPARTMENT_OTHER): Admission: RE | Admit: 2009-12-31 | Discharge: 2009-12-31 | Payer: Self-pay | Admitting: Hematology & Oncology

## 2009-12-31 LAB — CMP (CANCER CENTER ONLY)
Alkaline Phosphatase: 90 U/L — ABNORMAL HIGH (ref 26–84)
Glucose, Bld: 121 mg/dL — ABNORMAL HIGH (ref 73–118)
Sodium: 137 mEq/L (ref 128–145)
Total Bilirubin: 0.6 mg/dl (ref 0.20–1.60)
Total Protein: 8 g/dL (ref 6.4–8.1)

## 2009-12-31 LAB — CBC WITH DIFFERENTIAL (CANCER CENTER ONLY)
BASO#: 0 10*3/uL (ref 0.0–0.2)
EOS%: 1.9 % (ref 0.0–7.0)
HCT: 40.7 % (ref 34.8–46.6)
HGB: 13.6 g/dL (ref 11.6–15.9)
LYMPH%: 17.8 % (ref 14.0–48.0)
MCH: 30.9 pg (ref 26.0–34.0)
MCHC: 33.3 g/dL (ref 32.0–36.0)
MONO%: 8 % (ref 0.0–13.0)
NEUT%: 71.7 % (ref 39.6–80.0)

## 2010-01-04 LAB — SPEP & IFE WITH QIG
Alpha-2-Globulin: 14 % — ABNORMAL HIGH (ref 7.1–11.8)
Gamma Globulin: 12.9 % (ref 11.1–18.8)
IgG (Immunoglobin G), Serum: 1080 mg/dL (ref 694–1618)
IgM, Serum: 88 mg/dL (ref 60–263)

## 2010-01-04 LAB — VITAMIN D 25 HYDROXY (VIT D DEFICIENCY, FRACTURES): Vit D, 25-Hydroxy: 43 ng/mL (ref 30–89)

## 2010-04-28 ENCOUNTER — Ambulatory Visit: Payer: Self-pay | Admitting: Hematology & Oncology

## 2010-04-29 LAB — CBC WITH DIFFERENTIAL (CANCER CENTER ONLY)
BASO%: 0.9 % (ref 0.0–2.0)
HCT: 39.7 % (ref 34.8–46.6)
LYMPH#: 1.1 10*3/uL (ref 0.9–3.3)
MONO#: 0.6 10*3/uL (ref 0.1–0.9)
NEUT%: 69.9 % (ref 39.6–80.0)
RBC: 4.31 10*6/uL (ref 3.70–5.32)
RDW: 11.9 % (ref 10.5–14.6)
WBC: 6.8 10*3/uL (ref 3.9–10.0)

## 2010-06-12 ENCOUNTER — Encounter: Payer: Self-pay | Admitting: Hematology & Oncology

## 2010-08-08 LAB — POCT I-STAT, CHEM 8
Creatinine, Ser: 1.8 mg/dL — ABNORMAL HIGH (ref 0.4–1.2)
Glucose, Bld: 109 mg/dL — ABNORMAL HIGH (ref 70–99)
Hemoglobin: 12.9 g/dL (ref 12.0–15.0)
TCO2: 24 mmol/L (ref 0–100)

## 2010-08-08 LAB — URINALYSIS, ROUTINE W REFLEX MICROSCOPIC
Bilirubin Urine: NEGATIVE
Nitrite: NEGATIVE
Specific Gravity, Urine: 1.025 (ref 1.005–1.030)
pH: 5.5 (ref 5.0–8.0)

## 2010-08-08 LAB — URINE MICROSCOPIC-ADD ON

## 2010-09-02 ENCOUNTER — Other Ambulatory Visit: Payer: Self-pay | Admitting: Hematology & Oncology

## 2010-09-02 ENCOUNTER — Encounter (HOSPITAL_BASED_OUTPATIENT_CLINIC_OR_DEPARTMENT_OTHER): Payer: 59 | Admitting: Hematology & Oncology

## 2010-09-02 DIAGNOSIS — C9000 Multiple myeloma not having achieved remission: Secondary | ICD-10-CM

## 2010-09-02 DIAGNOSIS — Z9484 Stem cells transplant status: Secondary | ICD-10-CM

## 2010-09-02 LAB — CMP (CANCER CENTER ONLY)
AST: 24 U/L (ref 11–38)
BUN, Bld: 11 mg/dL (ref 7–22)
Calcium: 8.9 mg/dL (ref 8.0–10.3)
Chloride: 103 mEq/L (ref 98–108)
Creat: 1.1 mg/dl (ref 0.6–1.2)
Glucose, Bld: 121 mg/dL — ABNORMAL HIGH (ref 73–118)

## 2010-09-02 LAB — CBC WITH DIFFERENTIAL (CANCER CENTER ONLY)
BASO#: 0 10*3/uL (ref 0.0–0.2)
EOS%: 2.7 % (ref 0.0–7.0)
HCT: 39.9 % (ref 34.8–46.6)
HGB: 13.3 g/dL (ref 11.6–15.9)
LYMPH#: 1.6 10*3/uL (ref 0.9–3.3)
MCH: 30 pg (ref 26.0–34.0)
MCHC: 33.3 g/dL (ref 32.0–36.0)
NEUT%: 52.8 % (ref 39.6–80.0)

## 2010-09-05 LAB — BASIC METABOLIC PANEL
BUN: 14 mg/dL (ref 6–23)
Calcium: 9.7 mg/dL (ref 8.4–10.5)
GFR calc non Af Amer: 37 mL/min — ABNORMAL LOW (ref >60)
Glucose, Bld: 94 mg/dL (ref 70–99)

## 2010-09-06 LAB — SPEP & IFE WITH QIG
Alpha-1-Globulin: 4.6 % (ref 2.9–4.9)
Gamma Globulin: 15.1 % (ref 11.1–18.8)
IgA: 286 mg/dL (ref 68–378)
IgG (Immunoglobin G), Serum: 1190 mg/dL (ref 694–1618)
IgM, Serum: 92 mg/dL (ref 60–263)

## 2010-09-06 LAB — KAPPA/LAMBDA LIGHT CHAINS: Kappa:Lambda Ratio: 1.08 (ref 0.26–1.65)

## 2010-10-04 ENCOUNTER — Encounter (HOSPITAL_COMMUNITY)
Admission: RE | Admit: 2010-10-04 | Discharge: 2010-10-04 | Disposition: A | Discharge status: Home / Self Care | Payer: 59 | Source: Ambulatory Visit | Attending: Orthopaedic Surgery | Admitting: Orthopaedic Surgery

## 2010-10-04 ENCOUNTER — Ambulatory Visit (HOSPITAL_COMMUNITY)
Admission: RE | Admit: 2010-10-04 | Discharge: 2010-10-04 | Disposition: A | Discharge status: Home / Self Care | Payer: 59 | Source: Ambulatory Visit | Attending: Orthopaedic Surgery | Admitting: Orthopaedic Surgery

## 2010-10-04 ENCOUNTER — Other Ambulatory Visit (HOSPITAL_COMMUNITY): Payer: Self-pay | Admitting: Orthopaedic Surgery

## 2010-10-04 DIAGNOSIS — Z01811 Encounter for preprocedural respiratory examination: Secondary | ICD-10-CM | POA: Insufficient documentation

## 2010-10-04 DIAGNOSIS — M1712 Unilateral primary osteoarthritis, left knee: Secondary | ICD-10-CM

## 2010-10-04 DIAGNOSIS — I10 Essential (primary) hypertension: Secondary | ICD-10-CM | POA: Insufficient documentation

## 2010-10-04 DIAGNOSIS — Z01812 Encounter for preprocedural laboratory examination: Secondary | ICD-10-CM | POA: Insufficient documentation

## 2010-10-04 DIAGNOSIS — Z01818 Encounter for other preprocedural examination: Secondary | ICD-10-CM | POA: Insufficient documentation

## 2010-10-04 DIAGNOSIS — Z01810 Encounter for preprocedural cardiovascular examination: Secondary | ICD-10-CM | POA: Insufficient documentation

## 2010-10-04 DIAGNOSIS — C9000 Multiple myeloma not having achieved remission: Secondary | ICD-10-CM | POA: Insufficient documentation

## 2010-10-04 LAB — BASIC METABOLIC PANEL
CO2: 28 mEq/L (ref 19–32)
Calcium: 10.1 mg/dL (ref 8.4–10.5)
Creatinine, Ser: 1.27 mg/dL — ABNORMAL HIGH (ref 0.4–1.2)
GFR calc Af Amer: 52 mL/min — ABNORMAL LOW (ref >60)
GFR calc non Af Amer: 43 mL/min — ABNORMAL LOW (ref >60)
Sodium: 139 mEq/L (ref 135–145)

## 2010-10-04 LAB — CBC
MCHC: 33.6 g/dL (ref 30.0–36.0)
Platelets: 320 10*3/uL (ref 150–400)
RDW: 13.9 % (ref 11.5–15.5)
WBC: 6.8 10*3/uL (ref 4.0–10.5)

## 2010-10-04 LAB — APTT: aPTT: 35 seconds (ref 24–37)

## 2010-10-04 LAB — SURGICAL PCR SCREEN
MRSA, PCR: NEGATIVE
Staphylococcus aureus: POSITIVE — AB

## 2010-10-04 LAB — PROTIME-INR
INR: 0.91 (ref 0.00–1.49)
Prothrombin Time: 12.5 seconds (ref 11.6–15.2)

## 2010-10-04 LAB — ABO/RH: ABO/RH(D): AB NEG

## 2010-10-04 NOTE — Op Note (Signed)
NAMEAJANAY, [name]              ACCOUNT NO.:  [id number]   MEDICAL RECORD NO.:  [id number]          PATIENT TYPE:  OUT   LOCATION:  OMED                         FACILITY:  [facility]   PHYSICIAN:  Rose Phi. Myna Hidalgo, M.D. DATE OF BIRTH:  02-15-1946   DATE OF PROCEDURE:  10/02/2006  DATE OF DISCHARGE:                               OPERATIVE REPORT   PROCEDURE:  Left posterior iliac crest bone marrow biopsy and aspirate.   PHYSICIAN:  Rose Phi. Myna Hidalgo, M.D.   INDICATIONS FOR PROCEDURE:  [name] is brought to the short-stay  unit.  She had an IV placed into her right hand.  The patient was then  placed onto her right side.  She received a total of 5 mg of Versed for  IV sedation.  The left posterior iliac crest region was prepped and  draped in a sterile fashion and 10 mL of 2% lidocaine was infiltrated  under the skin, down to the periosteum with a #22 gauge spinal needle.  A #11 scalpel was used to make an incision through the skin.  Two bone  marrow aspirates were obtained without difficulty.   A second incision was made into the skin with a #11 scalpel.  A good  bone marrow biopsy core was obtained without difficulty.   Apparently the patient tolerated the procedure well.  There were no  complications.      Rose Phi. Myna Hidalgo, M.D.  Electronically Signed     PRE/MEDQ  D:  10/02/2006  T:  10/02/2006  Job:  [id number]

## 2010-10-04 NOTE — Op Note (Signed)
[name], [name]              ACCOUNT NO.:  [id number]   MEDICAL RECORD NO.:  [id number]          PATIENT TYPE:  AMB   LOCATION:  DSC                          FACILITY:  [facility]   PHYSICIAN:  Lubertha Basque. Dalldorf, M.D.DATE OF BIRTH:  11/27/1945   DATE OF PROCEDURE:  06/02/2008  DATE OF DISCHARGE:                               OPERATIVE REPORT   PREOPERATIVE DIAGNOSES:  1. Left knee degenerative joint disease.  2. Left knee torn medial meniscus.   POSTOPERATIVE DIAGNOSES:  1. Left knee degenerative joint disease.  2. Left knee torn medial meniscus.   PROCEDURES:  1. Left knee abrasion chondroplasty, patellofemoral.  2. Left knee partial medial meniscectomy.   ANESTHESIA:  Knee block and MAC.   ATTENDING SURGEON:  Lubertha Basque. Jerl Santos, MD   ASSISTANT:  Lindwood Qua, PA   INDICATIONS FOR PROCEDURE:  The patient is a 65 year old woman with a  long history of a painful left knee.  This has persisted despite oral  anti-inflammatories and injections.  She actually had an arthroscopy of  this knee about 10 or 12 years ago.  By MRI scan, she has a medial  meniscus tear.  There are also some degenerative changes, but on plain  film she seems to have fairly good maintenance of the joint space.  It  seemed reasonable to try an arthroscopy at this point as she has  continued pain at rest and with activities though I do not think she has  advanced degenerative change.  Informed operative consent was obtained  after discussion of possible complications of reaction to anesthesia and  infection.   SUMMARY/FINDINGS/PROCEDURE:  Under knee block and MAC, a left knee  arthroscopy was performed.  Suprapatellar pouch was benign while the  patellofemoral joint exhibited grade 4 change across the broad area of  the intertrochlear groove.  A thorough chondroplasty was done along with  abrasion to bleeding bone in some small areas.  The medial compartment  exhibited broad areas of grade 3 change  across the femur with small  areas of grade 4 change.  She did have a degenerative tear of the medial  meniscus addressed with about 10 or 15% partial medial meniscectomy.  The ACL was intact and the lateral compartment was relatively benign.   DESCRIPTION OF PROCEDURE:  The patient was taken to the operating suite  where knee block and MAC were applied.  She was positioned supine and  prepped and draped in normal sterile fashion.  After administration of  IV Kefzol, an arthroscopy of the left knee was performed through a total  of 2 portals.  Findings were as noted above and procedure consisted of  the partial medial meniscectomy done with basket and shaver followed by  abrasion chondroplasty of patellofemoral and standard chondroplasty  medial.  The knee was thoroughly irrigated followed by placement of  Marcaine with epinephrine and morphine plus some Depo-Medrol.  Adaptic  was applied followed by dry gauze and loose Ace wrap.  Estimated blood  loss and intraoperative fluids can be obtained from anesthesia records.   DISPOSITION:  The patient was taken to the  recovery room in stable  addition.  She was to go home same-day and follow up in the office  closely.  I will contact her by phone tonight.      [name], M.D.  Electronically Signed     PGD/MEDQ  D:  06/02/2008  T:  06/02/2008  Job:  [id number]

## 2010-10-07 NOTE — Consult Note (Signed)
[name], [name]              ACCOUNT NO.:  [id number]   MEDICAL RECORD NO.:  [id number]          PATIENT TYPE:  INP   LOCATION:  1316                         FACILITY:  [facility]   PHYSICIAN:  John C. Madilyn Fireman, M.D.    DATE OF BIRTH:  01/03/1946   DATE OF CONSULTATION:  03/16/2005  DATE OF DISCHARGE:                                   CONSULTATION   HISTORY OF PRESENT ILLNESS:  The patient is a 65 year old white female with  a history of multiple myeloma, who presents with shaking chills,  hypotension, weakness, and fever with temperature 100.3.  She has also had  dysphagia.  She was seen in May at the time of her original diagnosis with  odynophagia and had an EGD which showed severe exudative esophagitis with  cultures positive for both herpes simplex and candidiasis, and she was  treated with Diflucan and Acyclovir, respectively.  On admission here, she  was started on Diflucan and Acyclovir again 2 days ago, but her dysphagia  has not improved, and she has still been febrile despite treatment with the  2 medications above as well as cefepime.  Consultation is requested to  consider EGD to better assess the etiology of her odynophagia and status of  presumed esophagitis.  The patient states she does not have any true  odynophagia but feels that there is a presence or flap in her upper throat  that makes eating difficult.  She has able to take clear liquids and eat  some peaches today with some difficulty.  She has not had any nausea or  vomiting.   PAST MEDICAL HISTORY:  1.  Hypertension.  2.  Postherpetic neuralgia.  3.  Multiple myeloma.  4.  Osteoarthritis.  5.  Gastroesophageal reflux.   SURGERIES:  None.   FAMILY HISTORY:  Father died of lung cancer.  Sister had pancreatic cancer.  Brother had widespread cancer involving his liver.   SOCIAL HISTORY:  Negative for alcohol or tobacco use.   PHYSICAL EXAMINATION:  GENERAL:  Well-developed, well-nourished black female  in  mild distress when trying to swallowed.  A detailed exam was not done.   IMPRESSION:  Dysphagia/odynophagia in an immunosuppressed patient with  history of herpetic and candidal esophagitis in the past.   PLAN:  We will proceed with esophagoscopy tomorrow morning.  Further  recommendations to follow.           ______________________________  Everardo All Madilyn Fireman, M.D.     JCH/MEDQ  D:  03/16/2005  T:  03/16/2005  Job:  [id number]   cc:   Rose Phi. Myna Hidalgo, M.D.  Fax: [phone]   Bernette Redbird, M.D.  Fax: [phone]

## 2010-10-07 NOTE — Discharge Summary (Signed)
[name], [name]              ACCOUNT NO.:  [id number]   MEDICAL RECORD NO.:  [id number]          PATIENT TYPE:  INP   LOCATION:  1316                         FACILITY:  [facility]   PHYSICIAN:  [name], M.D. DATE OF BIRTH:  [birth year]   DATE OF ADMISSION:  03/14/2005  DATE OF DISCHARGE:  03/22/2005                                 DISCHARGE SUMMARY   DISCHARGE DIAGNOSES:  1.  Fever, status post autologous transplant for myeloma.  2.  Odynophagia.  3.  Weakness.  4.  Hypertension.  5.  Anemia.   CONDITION ON DISCHARGE:  Stable.   ACTIVITY:  As tolerated.   DIET:  No restrictions.   FOLLOW UP:  The patient will followup with Dr. Myna [name] in 2 weeks.   DISCHARGE MEDICATIONS:  1.  Prevacid 30 mg p.o. b.i.d.  2.  Neurontin 600 mg p.o. t.i.d.  3.  Clonidine 0.1 mg p.o. t.i.d.  4.  Lortab elixir 5/10 mL q.6 h p.r.n.  5.  Rozerem 8 mg p.o. q.h.s.   HOSPITAL COURSE:  [name] was admitted on the 24th. She had been  complaining of weakness. She was dehydrated. She underwent a stem cell  transplant at [facility] on February 10, 2005. She seemed to do fairly well.   She was at home. She has a lot of stress at home. Her husband and her kids  do not get along and this causes her a lot of stress.   She got to the point where she is not eating. She is having problems  swallowing.   She was admitted. GI saw her. She underwent endoscopy. The endoscopy showed  no evidence of esophagitis. She did undergo a modified barium swallow. She  had some mild dysmotility but this was not felt to be a problem. She did  have some gagging. It was felt that this was a conditioned response. It  only happed with food, not liquids.   She had a chest x-ray on admission which had a questionable infiltrate. She  was not neutropenic on admission. She was a little anemic. She did receive 2  units of packed red blood cells on the 28th. This seemed to perk her up  quite a bit. She seemed to improve  daily. Once we found that she did not  have any infection, we were able to get her off antibiotics. She apparently  did have enterococcus in her urine. This was treated adequately with IV  Unasyn.   She had C difficile, this was negative.   She really wanted to go home on the first. She felt she was able to go home.   She had blood work done on the day of discharge. This showed a white cell  count 5.1, hemoglobin 11.7, hematocrit 33.7, platelet count 201. Sodium 139,  potassium 3.8, BUN 4, creatinine 1.0 and glucose was 104. Albumin was 2.2.   She was placed on clonidine during the hospital stay because of some  problems with hypertension.      [name], M.D.  Electronically Signed     PRE/MEDQ  D:  03/22/2005  T:  03/23/2005  Job:  [id number]

## 2010-10-07 NOTE — H&P (Signed)
NAMELAWREN, [name]              ACCOUNT NO.:  [id number]   MEDICAL RECORD NO.:  [id number]          PATIENT TYPE:  INP   LOCATION:  1330                         FACILITY:  [facility]   PHYSICIAN:  Blenda Nicely. Shadad        DATE OF BIRTH:  [birth year]   DATE OF ADMISSION:  03/14/2005  DATE OF DISCHARGE:                                HISTORY & PHYSICAL   PRIMARY ONCOLOGIST:  Dr. Arlan Organ   REASON FOR ADMISSION:  Weakness, dehydration, and fever.   HISTORY OF PRESENT ILLNESS:  This is a 65 year old female native of  [place] who was recently diagnosed with multiple myeloma. Her illness  dates back to April 2004 where she was admitted with abdominal pain, chest  pain, and epistaxis. The patient found to be anemic, hemoglobin of 8.8, and  a creatinine of 2.9, a calcium of 12.8. Her total protein was 14, albumin  2.1. Her IgG was 8760, IgA was 44, and IgM 34. Her serum protein  electrophoresis revealed a monoclonal spike of 3.56, 3, and 0.7 IgG kappa  light. A 24-hour urine collection of urine showed a total of 256 mg protein,  IgG kappa light chain. Bone marrow performed on April 21 showed a 33% plasma  cell with normal cytogenetics. Bone survey showed multiple lytic lesions in  the skull suggestive of myeloma. The patient was treated initially with IV  fluids, IV Zometa, and packed red cell transfusion, and subsequently  received four cycles of Decadron, Velcade, and Doxil. Course complicated by  candida esophagitis, Herpes zoster infection, and peripheral neuropathy. The  patient had a repeat marrow on August 8 which showed a 2-3% plasma cell  concentration. The patient was referred to [facility] for autologous stem cell  transplantation. She underwent stem cell collection, a total of 4.6 x  1,000,000 CD34 count per kilogram. She tolerated that procedure well and was  admitted for stem cell transplantation. Her day #0 was September 22 and her  conditioning regimen included melphalan. The  patient tolerated transplant  fairly well and had normal recovery of her counts. However, the patient  called in today to our office reporting weakness, fever, and inability to  take p.o. Upon evaluation in the clinic, the patient had some shaking  chills, had hypotension and overall weakness.   REVIEW OF SYSTEMS:  She did report chills but she did not have any  subjective fever. She did not have any cough, hemoptysis, or hematemesis.  Did not report any diarrhea. Some occasional constipation. Did have a  problem with dysphagia as well as ulcer oral pain. She did not have any  bleeding disorder. She did not have any difficulty breathing, did not have  any abdominal pain. The rest of review of systems is unremarkable.   PAST MEDICAL HISTORY:  1.  Hypertension.  2.  Postherpetic neuralgia with gabapentin being used.  3.  History of osteoarthritis.  4.  GERD.   FAMILY HISTORY:  Unremarkable.   SOCIAL HISTORY:  Negative for tobacco, alcohol, or any exposure to any  toxins.   FAMILY HISTORY:  Father died of lung  cancer, sister had pancreatic cancer,  and brother some form of other cancer that metastasized to his liver.   PHYSICAL EXAMINATION:  GENERAL:  Alert, awake female, appeared acutely ill.  Appeared in mild distress.  VITAL SIGNS:  Her blood pressure is 125/85, pulse was 119, respirations 24,  temperature 100.3.  HEENT:  Head is normocephalic, atraumatic. Pupils equal and round, reactive  to light. Mucous membranes moist and pink. Neck supple, no lymphadenopathy.  Examination of oral mucosa showed a lesion on her hard palate that looks  herpetic.  ABDOMEN:  Soft, nontender. No hepatosplenomegaly.  EXTREMITIES:  No clubbing, cyanosis, or edema.  NEUROLOGIC:  Intact.   Her blood work showed white cells of 7.6, hemoglobin 11.3, and absolute  neutrophil count of 5200.   ASSESSMENT AND PLAN:  This is a 65 year old female with IgA kappa myeloma  status post recent autologous  stem cell transplant utilizing melphalan as a  conditioning regimen, with recent recovery of her counts, and was recently  discharged. Now presenting with fevers, chills, dysphagia, and overall  weakness. The differential diagnosis is she certainly could have an acute  bacterial infection, but other infectious process to be considered such as  herpes esophagitis or CMV, although it is less likely given the fact she has  autologous transplant, but that is certainly a possibility. A fungal  infection is also a possibility.   So, my plan will be at this point, I will initiate IV fluids, do panculture,  do a chest x-ray. I will initiate antibacterial/antifungal/antiviral  treatments with cefepime, acyclovir, as well as Diflucan. Initiate IV fluids  and pain control and nausea control, and follow her very closely. I will  hold off on her blood pressure medication, given the fact that she is on the  tachycardic and the dehydrated side. However, will keep as close eye on  that. If we need to initiate it, we will do that.           ______________________________  Blenda Nicely. [facility]  Electronically Signed     FNS/MEDQ  D:  03/14/2005  T:  03/14/2005  Job:  [id number]

## 2010-10-11 ENCOUNTER — Inpatient Hospital Stay (HOSPITAL_COMMUNITY)
Admission: RE | Admit: 2010-10-11 | Discharge: 2010-10-14 | DRG: 470 | Disposition: A | Discharge status: Home Health | Payer: 59 | Source: Ambulatory Visit | Attending: Orthopaedic Surgery | Admitting: Orthopaedic Surgery

## 2010-10-11 DIAGNOSIS — M1710 Unilateral primary osteoarthritis, unspecified knee: Principal | ICD-10-CM | POA: Diagnosis present

## 2010-10-11 DIAGNOSIS — G4700 Insomnia, unspecified: Secondary | ICD-10-CM | POA: Diagnosis present

## 2010-10-11 DIAGNOSIS — I10 Essential (primary) hypertension: Secondary | ICD-10-CM | POA: Diagnosis present

## 2010-10-11 DIAGNOSIS — E119 Type 2 diabetes mellitus without complications: Secondary | ICD-10-CM | POA: Diagnosis present

## 2010-10-11 DIAGNOSIS — K219 Gastro-esophageal reflux disease without esophagitis: Secondary | ICD-10-CM | POA: Diagnosis present

## 2010-10-11 DIAGNOSIS — E785 Hyperlipidemia, unspecified: Secondary | ICD-10-CM | POA: Diagnosis present

## 2010-10-11 HISTORY — PX: TOTAL KNEE ARTHROPLASTY: SHX125

## 2010-10-12 LAB — BASIC METABOLIC PANEL
BUN: 11 mg/dL (ref 6–23)
CO2: 28 mEq/L (ref 19–32)
Chloride: 103 mEq/L (ref 96–112)
Potassium: 4 mEq/L (ref 3.5–5.1)

## 2010-10-12 LAB — CBC
HCT: 29.2 % — ABNORMAL LOW (ref 36.0–46.0)
Hemoglobin: 9.7 g/dL — ABNORMAL LOW (ref 12.0–15.0)
MCHC: 33.2 g/dL (ref 30.0–36.0)
MCV: 91.8 fL (ref 78.0–100.0)

## 2010-10-12 LAB — PROTIME-INR: INR: 1.11 (ref 0.00–1.49)

## 2010-10-13 LAB — CBC
HCT: 33.3 % — ABNORMAL LOW (ref 36.0–46.0)
MCV: 90.7 fL (ref 78.0–100.0)
RBC: 3.67 MIL/uL — ABNORMAL LOW (ref 3.87–5.11)
RDW: 13.6 % (ref 11.5–15.5)
WBC: 9 10*3/uL (ref 4.0–10.5)

## 2010-10-13 LAB — BASIC METABOLIC PANEL
CO2: 25 mEq/L (ref 19–32)
Chloride: 99 mEq/L (ref 96–112)
GFR calc Af Amer: 49 mL/min — ABNORMAL LOW (ref >60)
Sodium: 137 mEq/L (ref 135–145)

## 2010-10-14 LAB — BASIC METABOLIC PANEL
CO2: 27 mEq/L (ref 19–32)
Chloride: 102 mEq/L (ref 96–112)
GFR calc non Af Amer: 51 mL/min — ABNORMAL LOW (ref >60)
Glucose, Bld: 148 mg/dL — ABNORMAL HIGH (ref 70–99)
Potassium: 3.5 mEq/L (ref 3.5–5.1)
Sodium: 138 mEq/L (ref 135–145)

## 2010-10-14 LAB — CBC
HCT: 27.3 % — ABNORMAL LOW (ref 36.0–46.0)
Hemoglobin: 9.2 g/dL — ABNORMAL LOW (ref 12.0–15.0)
MCH: 30.4 pg (ref 26.0–34.0)
MCV: 90.1 fL (ref 78.0–100.0)
RBC: 3.03 MIL/uL — ABNORMAL LOW (ref 3.87–5.11)

## 2010-10-24 NOTE — Op Note (Signed)
NAMECORIANN, BROUHARD              ACCOUNT NO.:  [id number]  MEDICAL RECORD NO.:  [id number]           PATIENT TYPE:  I  LOCATION:  5009                         FACILITY:  [facility]  PHYSICIAN:  Lubertha Basque. Normagene Harvie, M.D.DATE OF BIRTH:  04-18-1946  DATE OF PROCEDURE:  10/11/2010 DATE OF DISCHARGE:                              OPERATIVE REPORT   PREOPERATIVE DIAGNOSIS:  Left knee degenerative joint disease.  POSTOPERATIVE DIAGNOSIS:  Left knee degenerative joint disease.  PROCEDURE:  Left total knee replacement.  ANESTHESIA:  General and block.  ATTENDING SURGEON:  Lubertha Basque. Jerl Santos, MD  ASSISTANT:  Lindwood Qua, PA   INDICATIONS FOR PROCEDURE:  The patient is a 65 year old woman with long history of a painful left knee.  This has persisted despite oral anti- inflammatories and injections as well as an arthroscopy several years back.  She has advanced degeneration on x-ray and has pain, which limits her ability to rest and walk.  She is offered a knee replacement. Informed operative consent was obtained after discussion of possible complications including reaction to anesthesia, infection, DVT, PE, and death.  The importance of the postoperative rehabilitation protocol to optimize result was stressed extensively with the patient.  SUMMARY, FINDINGS, AND PROCEDURE:  Under general anesthesia and a block through a standard anterior incision, a left knee replacement was performed.  She had advanced degenerative change, but excellent bone quality.  We addressed her problem with a cemented DePuy system using standard plus femur, 10 deep dish spacer, four MBT tibial tray, and 38 all polyethylene patella.  I did include Zinacef antibiotic in the cement.  Lindwood Qua and Patrick Jupiter assisted throughout.  DESCRIPTION OF PROCEDURE:  The patient was taken to the operating suite where general anesthetic was applied without difficulty.  She was also given a block in the  preanesthesia area.  She was positioned supine and prepped and draped in normal sterile fashion.  After the administration of preop IV Kefzol, the left leg was elevated, exsanguinated, tourniquet inflated about the thigh.  A longitudinal anterior incision was made with dissection down the extensor mechanism.  All appropriate anti- infective measures were used including closed-hooded exhaust systems for the surgical team, Betadine-impregnated drape, the preoperative IV antibiotic.  A medial parapatellar incision was made.  The kneecap was flipped and the knee flexed.  Some residual meniscal tissues were removed along with the ACL and PCL and some large osteophytes.  We placed an intramedullary guide in the tibia to make a roughly flat cut. We then placed another intramedullary guide in the femur to make anterior-posterior cuts creating a flexion gap of 10 mm.  Another intramedullary guide was placed in the femur to make a distal cut creating an equal extension gap of 10 mm balancing the knee.  The femur sized to a standard plus and the tibia to a 4 with appropriate guides placed and utilized including a reaming for the stem on the tibia.  The patella was cut down to thickness by about 12 mm to size 14 thickness. This sized to a 38.  An appropriate guide was placed and utilized.  A trial reduction was  done with all these components.  Her knee came out to slight hyperextension, and the patella tracked in a slightly lateral position on flexion.  We elected to perform a moderate lateral release, and this did correct the tracking of the patella completely.  The trial components were removed followed by pulsatile lavage, irrigation of all three cut bony surfaces.  Cement was mixed including Zinacef and was pressurized onto the bones followed by placement of the aforementioned DePuy components.  Excess cement was trimmed and pressure was held on the components until the cement hardened.  The  tourniquet was deflated and a small amount of bleeding was easily controlled with pressure and Bovie cautery.  The extensor mechanism reapproximated with #1 Vicryl in interrupted fashion over a drain, which was placed superolaterally. Subcutaneous tissues reapproximated with 0 and 2-0 undyed Vicryl followed by skin closure with staples.  Adaptic was applied followed by dry gauze and a loose Ace wrap.  Estimated blood loss and fluids obtained from the anesthesia records.  DISPOSITION:  The patient was extubated in the operating room and taken to recovery room in stable condition.  She is to be admitted to the Orthopedic Surgery Service for appropriate postop care to include perioperative antibiotics and Coumadin plus Lovenox for DVT prophylaxis.     [name], M.D.     PGD/MEDQ  D:  10/11/2010  T:  10/12/2010  Job:  [id number]  Electronically Signed by Marcene Corning M.D. on 10/24/2010 01:37:39 PM

## 2010-11-22 NOTE — H&P (Signed)
  [name], [name]              ACCOUNT NO.:  [id number]  MEDICAL RECORD NO.:  [id number]           PATIENT TYPE:  LOCATION:                                 FACILITY:  PHYSICIAN:  [name]. [name], M.D.DATE OF BIRTH:  [birth year]  DATE OF ADMISSION:  10/11/2010 DATE OF DISCHARGE:  10/14/2010                             HISTORY & PHYSICAL   CHIEF COMPLAINT:  Left knee pain.  [name] is a patient well-known to our practice.  She is complaining of increasing left knee pain, trouble when she ambulates, trouble sleeping at nighttime.  She has had a knee arthroscopy back in 2010, which revealed end-stage DJD at that point.  She has had viscosupplementation injections, corticosteroid injections, and failed anti-inflammatory medicines.  We discussed with her the risk of anesthesia, infection, DVT, and possible death associated with knee replacement surgery.  PAST MEDICAL HISTORY:  [name] is her family physician.  She has no drug allergies.  She has no diabetes and no cholesterol issues.  Additional 14-point review of systems positive for loss of appetite. She has had a history of cancer.  She does wear glasses and has hypertension.  FAMILY HISTORY:  Positive for diabetes, positive for hypertension, positive for heart disease.  SOCIAL HISTORY:  She does not smoke, does not drink alcohol.  CURRENT MEDICATIONS:  Meloxicam, Ambien for sleep, WelChol for lipids, Cartia, vitamin D supplementation, and Zometa for Paget disease.  PHYSICAL EXAMINATION:  VITAL SIGNS:  Stable.  Blood pressure within normal limits.  She is afebrile. HEENT:  Eyes PERRLA. GENERAL:  She is well-developed, well-nourished, oriented.  Appropriate speech and behavior. NECK:  Cervical spine full motion.  Negative JVD.  Negative bruits. LUNGS:  Clear to A and P. CARDIAC:  Regular rate and rhythm.  S1 and S2. ABDOMEN:  Positive bowel sounds.  Negative guarding.  No spleen or liver enlargement.  No  masses of tenderness noted. EXTREMITIES:  Upper extremity motion, shoulders, elbows, and wrists are within normal limits.  She has normal neurovascular status in both upper and lower extremities.  Lower extremity examination, left knee is 5-110 degrees.  Calf soft and nontender.  She has some patellofemoral crepitation.  Trace effusion.  Stable ligamentous structures including ACL, MCL, LCL, and PCL.  Hip motion, ankle motion are full and pain free with normal sensation to both lower extremities.  ASSESSMENT: 1. Left knee end-stage degenerative joint disease status post     arthroscopy in 2010. 2. Hyperlipidemia. 3. Insomnia. 4. History of multiple myeloma, treated by Dr. Myna [name]. 5. Gastroesophageal reflux disease. 6. Hypertension.  PLAN:  Do a total knee replacement on her left knee, admit to the hospital postoperatively for pain control and physical therapy and then discharge when appropriate.     [name], P.A.   ______________________________ [name]. [name], M.D.    MC/MEDQ  D:  10/10/2010  T:  10/11/2010  Job:  [id number]  Electronically Signed by [name] P.A. on 11/01/2010 11:44:55 AM Electronically Signed by [name] M.D. on 11/22/2010 05:03:30 PM

## 2010-11-22 NOTE — Discharge Summary (Signed)
[name], [name]              ACCOUNT NO.:  [id number]  MEDICAL RECORD NO.:  [id number]           PATIENT TYPE:  I  LOCATION:  5009                         FACILITY:  [facility]  PHYSICIAN:  Lubertha Basque. Elliett Guarisco, M.D.DATE OF BIRTH:  11-Jun-1945  DATE OF ADMISSION:  10/11/2010 DATE OF DISCHARGE:  10/14/2010                              DISCHARGE SUMMARY   ADMITTING DIAGNOSES: 1. Left knee end-stage degenerative joint disease. 2. Hyperlipidemia. 3. Insomnia. 4. History of multiple myeloma treated by Dr. Myna Hidalgo. 5. Gastroesophageal reflux. 6. Hypertension.  DISCHARGE DIAGNOSES: 1. Left knee end-stage degenerative joint disease. 2. Hyperlipidemia. 3. Insomnia. 4. History of multiple myeloma treated by Dr. Myna Hidalgo. 5. Gastroesophageal reflux. 6. Hypertension.  OPERATIONS:  Left total knee replacement.  [name] is a patient well known to our practice, who has had increasing left knee pain to the point now that she is having trouble sleeping and ambulating without pain.  She has failed an arthroscopy back in 2010.  X- rays reveal end-stage DJD and we have discussed with her total knee replacement treatment with the risk of anesthesia, infection, DVT, and possible death.  PERTINENT LABORATORY AND X-RAY FINDINGS:  Hemoglobin 9.7, hematocrit 29.2, WBCs 6.5.  Chem-7; sodium 137, potassium 4.0, BUN 11, creatinine 1.14, glucose 141 and then serial INRs were drawn as she was on low-dose Coumadin protocol.  COURSE IN THE HOSPITAL:  She was admitted postoperatively, was given a variety of IV and p.o. analgesics for pain.  Pharmacy was contacted for DVT prophylaxis with Lovenox and Coumadin.  She was also placed on various antispasmodics, antiemetics, and then kept on her home medications p.r.n.  Orthopedic orders were implemented.  She was in knee- high TEDs, had an incentive spirometer, and then physical therapy was ordered for weightbearing as tolerated with the use of a CPM machine  0- 50 degrees initially 6-8 hours a day and advanced as tolerated both in time and in motion.  The first day postop, blood pressure was 115/56, heart rate 91, respirations 16, temperature 98.3 and her vital signs remained stable throughout her hospital stay.  Her abdomen was soft. Positive bowel sounds.  Lungs were clear.  Left knee within normal limits.  Foley catheter was discontinued and Physical Therapy got her up and out of bed.  Second day postop, she progressed well.  Dressing was changed and drain was removed.  No sign of infection or irritation. Calf soft and nontender.  She continued to progress well with Physical Therapy, was able to void on her own, was eating and drinking with no problem and working well with Physical Therapy and was discharged home.  CONDITION ON DISCHARGE:  Improved.  FOLLOWUP:  She will remain on Coumadin for a total of 2 weeks with the INR between 2.0 and 3.0.  Other medications can be found on her med rec sheet on discharge.  DIET:  As tolerated and advance to regular diet.  Weightbearing as tolerated.  May change her left knee dressing daily. Any sign of infection which would be redness, drainage, increasing pain, to call our office at [phone] for immediate appointment and then also to be seen  in 2 weeks postoperatively if the things go well for staple removal.  She had arrangements for home health care for home PT and blood draws.     Lindwood Qua, P.A.   ______________________________ Lubertha Basque. Jerl Santos, M.D.    MC/MEDQ  D:  10/16/2010  T:  10/16/2010  Job:  [id number]  Electronically Signed by Lindwood Qua P.A. on 11/01/2010 11:45:14 AM Electronically Signed by Marcene Corning M.D. on 11/22/2010 05:03:33 PM

## 2010-11-30 ENCOUNTER — Other Ambulatory Visit: Payer: Self-pay | Admitting: Hematology & Oncology

## 2010-11-30 ENCOUNTER — Encounter (HOSPITAL_BASED_OUTPATIENT_CLINIC_OR_DEPARTMENT_OTHER): Payer: Medicare Other | Admitting: Hematology & Oncology

## 2010-11-30 DIAGNOSIS — Z9484 Stem cells transplant status: Secondary | ICD-10-CM

## 2010-11-30 DIAGNOSIS — C9000 Multiple myeloma not having achieved remission: Secondary | ICD-10-CM

## 2010-11-30 LAB — CBC WITH DIFFERENTIAL (CANCER CENTER ONLY)
BASO#: 0 10*3/uL (ref 0.0–0.2)
EOS%: 3.1 % (ref 0.0–7.0)
HCT: 34.1 % — ABNORMAL LOW (ref 34.8–46.6)
HGB: 11.5 g/dL — ABNORMAL LOW (ref 11.6–15.9)
LYMPH#: 1.2 10*3/uL (ref 0.9–3.3)
MCH: 31.1 pg (ref 26.0–34.0)
MCHC: 33.7 g/dL (ref 32.0–36.0)
NEUT%: 59.4 % (ref 39.6–80.0)

## 2010-12-02 LAB — COMPREHENSIVE METABOLIC PANEL
Albumin: 4.5 g/dL (ref 3.5–5.2)
BUN: 13 mg/dL (ref 6–23)
Calcium: 9.8 mg/dL (ref 8.4–10.5)
Chloride: 104 mEq/L (ref 96–112)
Creatinine, Ser: 1.22 mg/dL — ABNORMAL HIGH (ref 0.50–1.10)
Glucose, Bld: 106 mg/dL — ABNORMAL HIGH (ref 70–99)
Potassium: 3.9 mEq/L (ref 3.5–5.3)

## 2010-12-02 LAB — IRON AND TIBC: Iron: 49 ug/dL (ref 42–145)

## 2010-12-02 LAB — KAPPA/LAMBDA LIGHT CHAINS: Lambda Free Lght Chn: 1.75 mg/dL (ref 0.57–2.63)

## 2010-12-02 LAB — SPEP & IFE WITH QIG
Alpha-2-Globulin: 12.3 % — ABNORMAL HIGH (ref 7.1–11.8)
IgA: 313 mg/dL (ref 68–380)
IgG (Immunoglobin G), Serum: 1400 mg/dL (ref 690–1700)
Total Protein, Serum Electrophoresis: 8 g/dL (ref 6.0–8.3)

## 2010-12-02 LAB — FERRITIN: Ferritin: 188 ng/mL (ref 10–291)

## 2011-02-28 ENCOUNTER — Encounter: Payer: Self-pay | Admitting: *Deleted

## 2011-02-28 LAB — URINALYSIS, ROUTINE W REFLEX MICROSCOPIC
Ketones, ur: NEGATIVE
Nitrite: NEGATIVE
Specific Gravity, Urine: 1.025
pH: 6

## 2011-02-28 LAB — CBC
MCHC: 34.2
Platelets: 365
RBC: 3.98
RDW: 14.5

## 2011-02-28 LAB — DIFFERENTIAL
Basophils Absolute: 0
Basophils Relative: 0
Lymphocytes Relative: 15
Neutro Abs: 4.5
Neutrophils Relative %: 75

## 2011-02-28 LAB — BASIC METABOLIC PANEL
BUN: 13
CO2: 26
Calcium: 9.8
Creatinine, Ser: 1.67 — ABNORMAL HIGH
GFR calc Af Amer: 38 — ABNORMAL LOW

## 2011-02-28 LAB — URINE MICROSCOPIC-ADD ON

## 2011-02-28 LAB — POCT CARDIAC MARKERS
CKMB, poc: 2
Operator id: 4661
Troponin i, poc: <0.05
Troponin i, poc: <0.05

## 2011-03-13 ENCOUNTER — Encounter: Payer: Self-pay | Admitting: *Deleted

## 2011-03-15 ENCOUNTER — Encounter: Payer: Self-pay | Admitting: *Deleted

## 2011-03-28 DIAGNOSIS — C9000 Multiple myeloma not having achieved remission: Secondary | ICD-10-CM

## 2011-03-28 HISTORY — DX: Multiple myeloma not having achieved remission: C90.00

## 2011-03-29 ENCOUNTER — Other Ambulatory Visit: Payer: Self-pay | Admitting: Hematology & Oncology

## 2011-03-29 ENCOUNTER — Ambulatory Visit (HOSPITAL_BASED_OUTPATIENT_CLINIC_OR_DEPARTMENT_OTHER): Payer: 59 | Admitting: Hematology & Oncology

## 2011-03-29 ENCOUNTER — Ambulatory Visit: Payer: 59

## 2011-03-29 ENCOUNTER — Other Ambulatory Visit (HOSPITAL_BASED_OUTPATIENT_CLINIC_OR_DEPARTMENT_OTHER): Payer: 59 | Admitting: Lab

## 2011-03-29 ENCOUNTER — Encounter: Payer: Self-pay | Admitting: Hematology & Oncology

## 2011-03-29 VITALS — BP 127/82 | HR 95 | Temp 97.3°F | Ht 62.0 in | Wt 172.0 lb

## 2011-03-29 DIAGNOSIS — C9000 Multiple myeloma not having achieved remission: Secondary | ICD-10-CM

## 2011-03-29 DIAGNOSIS — M159 Polyosteoarthritis, unspecified: Secondary | ICD-10-CM

## 2011-03-29 DIAGNOSIS — Z9484 Stem cells transplant status: Secondary | ICD-10-CM

## 2011-03-29 HISTORY — DX: Polyosteoarthritis, unspecified: M15.9

## 2011-03-29 LAB — CBC WITH DIFFERENTIAL (CANCER CENTER ONLY)
BASO#: 0 10*3/uL (ref 0.0–0.2)
EOS%: 1 % (ref 0.0–7.0)
Eosinophils Absolute: 0.1 10*3/uL (ref 0.0–0.5)
HCT: 39.8 % (ref 34.8–46.6)
HGB: 13.5 g/dL (ref 11.6–15.9)
LYMPH#: 1.8 10*3/uL (ref 0.9–3.3)
MCH: 31.3 pg (ref 26.0–34.0)
MCHC: 33.9 g/dL (ref 32.0–36.0)
NEUT#: 3.5 10*3/uL (ref 1.5–6.5)
NEUT%: 59.2 % (ref 39.6–80.0)
RBC: 4.32 10*6/uL (ref 3.70–5.32)

## 2011-03-29 LAB — CMP (CANCER CENTER ONLY)
AST: 17 U/L (ref 11–38)
Albumin: 3.6 g/dL (ref 3.3–5.5)
BUN, Bld: 14 mg/dL (ref 7–22)
Calcium: 9.3 mg/dL (ref 8.0–10.3)
Chloride: 100 mEq/L (ref 98–108)
Creat: 1.1 mg/dl (ref 0.6–1.2)
Glucose, Bld: 99 mg/dL (ref 73–118)
Potassium: 3.8 mEq/L (ref 3.3–4.7)

## 2011-03-29 LAB — IRON AND TIBC
%SAT: 17 % — ABNORMAL LOW (ref 20–55)
%SAT: 17 % — ABNORMAL LOW (ref 20–55)
Iron: 52 ug/dL (ref 42–145)
Iron: 52 ug/dL (ref 42–145)
TIBC: 301 ug/dL (ref 250–470)
UIBC: 249 ug/dL (ref 125–400)

## 2011-03-29 MED ORDER — ZOLEDRONIC ACID 4 MG/5ML IV CONC
4.0000 mg | Freq: Once | INTRAVENOUS | Status: AC
  Administered 2011-03-29: 4 mg via INTRAVENOUS
  Filled 2011-03-29: qty 5

## 2011-03-29 MED ORDER — SODIUM CHLORIDE 0.9 % IV SOLN
Freq: Once | INTRAVENOUS | Status: AC
  Administered 2011-03-29: 13:00:00 via INTRAVENOUS

## 2011-03-29 NOTE — Progress Notes (Signed)
CSN: [id number] This office note has been dictated. Josph Macho 03/29/2011 Dictation # is [id number]

## 2011-03-29 NOTE — Progress Notes (Signed)
CC:   Erin Pounds, MD [name], M.D.  DIAGNOSES: 1. IgG kappa myeloma. 2. Post bone marrow transplantation arthropathy.  CURRENT THERAPY:  Zometa 4 mg IV q.4 months.  INTERIM HISTORY:  [name] is coming for followup.  Unfortunately she is under lot of stress now.  Her husband has been having a lot of affairs.  He has an illicit child up in [place].  He apparently lives with one of [name]'s cousins now.  She has also apparently lost disability.  I find this is very disturbing to me.  She is having a hard time with ambulating.  She has a lot of arthropathy from her bone marrow transplant.  She did have knee surgery on her left knee back in May.  She is still having problems with recovering from this.  Her myeloma, thankfully, has not been a problem.  When I last saw her in July, her monoclonal spike was negative.  Her serum IgG level was 1400. Serum kappa light chain was 2.16 mg/dL.  Of note, she has had no fever.  There have been no infections.  She has had no change in bowel or bladder habits.  PHYSICAL EXAMINATION:  This is a well-developed, well-nourished black female in no obvious distress.  Vital signs:  Temperature 97.3, pulse 95, respiratory rate 18, blood pressure 126/82, weight is 172.  Head and neck:  Normocephalic, atraumatic skull.  There are no ocular or oral lesions.  There are no palpable cervical or supraclavicular lymph nodes. Lungs:  Clear to percussion and auscultation bilaterally.  Cardiac: Regular rate and rhythm with normal S1, S2.  There are no murmurs, rubs, or bruits.  Abdomen:  Soft with good bowel sounds.  There is no palpable abdominal mass.  There is no fluid wave.  There is no palpable hepatosplenomegaly.  Extremities:  No clubbing, cyanosis or edema.  She does have stiffness in her left knee.  There is some slight swelling of the left knee.  There is no warmth around the knee joint.  She has good pulses in her distal  extremities.  Skin:  No rashes.  LABORATORY STUDIES:  White count is 5.8, hemoglobin 13.5, hematocrit 39.8, platelet count 339.  IMPRESSION:  [name] is a 65 year old African female with history of IgG kappa myeloma.  She did undergo stem cell transplantation.  This was done at [facility].  She is now out from this by about 6 years.  She is still having a tough time.  She seems to have a lot of arthralgias and arthropathies from the transplant.  I do not think that this is uncommon of a side effect from high-dose chemotherapy and stem cell transplant.  I just do not see how she really is able to work.  Will go ahead and give her Zometa today.  Will then plan to get her back in another 4 months for another dose.  I just feel bad that she is undergoing a lot of stress with respect to her marriage and her husband who clearly is "clueless."    ______________________________ Josph Macho, M.D. PRE/MEDQ  D:  03/29/2011  T:  03/29/2011  Job:  [id number]  ADDENDUM: (-) M-Spike.  IgG is 1380 mg/dL

## 2011-03-30 ENCOUNTER — Telehealth: Payer: Self-pay | Admitting: Hematology & Oncology

## 2011-03-30 LAB — IRON AND TIBC
%SAT: 17 % — ABNORMAL LOW (ref 20–55)
Iron: 52 ug/dL (ref 42–145)
TIBC: 301 ug/dL (ref 250–470)
TIBC: 301 ug/dL (ref 250–470)
UIBC: 249 ug/dL (ref 125–400)
UIBC: 249 ug/dL (ref 125–400)

## 2011-03-30 LAB — FERRITIN: Ferritin: 138 ng/mL (ref 10–291)

## 2011-03-30 LAB — BETA 2 MICROGLOBULIN, SERUM: Beta-2 Microglobulin: 1.98 mg/L — ABNORMAL HIGH (ref 1.01–1.73)

## 2011-03-30 LAB — LACTATE DEHYDROGENASE: LDH: 139 U/L (ref 94–250)

## 2011-03-30 NOTE — Telephone Encounter (Signed)
Pt aware of 07-26-11 appointment

## 2011-03-31 LAB — SPEP & IFE WITH QIG
Albumin ELP: 56.3 % (ref 55.8–66.1)
Alpha-1-Globulin: 4.5 % (ref 2.9–4.9)
Alpha-1-Globulin: 4.5 % (ref 2.9–4.9)
Alpha-2-Globulin: 11.6 % (ref 7.1–11.8)
Gamma Globulin: 16 % (ref 11.1–18.8)
IgM, Serum: 118 mg/dL (ref 52–322)
Total Protein, Serum Electrophoresis: 7.4 g/dL (ref 6.0–8.3)
Total Protein, Serum Electrophoresis: 7.4 g/dL (ref 6.0–8.3)

## 2011-03-31 LAB — FERRITIN
Ferritin: 138 ng/mL (ref 10–291)
Ferritin: 138 ng/mL (ref 10–291)

## 2011-03-31 LAB — BETA 2 MICROGLOBULIN, SERUM
Beta-2 Microglobulin: 1.98 mg/L — ABNORMAL HIGH (ref 1.01–1.73)
Beta-2 Microglobulin: 1.98 mg/L — ABNORMAL HIGH (ref 1.01–1.73)
Beta-2 Microglobulin: 1.98 mg/L — ABNORMAL HIGH (ref 1.01–1.73)

## 2011-03-31 LAB — IRON AND TIBC
%SAT: 17 % — ABNORMAL LOW (ref 20–55)
%SAT: 17 % — ABNORMAL LOW (ref 20–55)
Iron: 52 ug/dL (ref 42–145)
TIBC: 301 ug/dL (ref 250–470)

## 2011-03-31 LAB — LACTATE DEHYDROGENASE: LDH: 139 U/L (ref 94–250)

## 2011-04-03 ENCOUNTER — Telehealth: Payer: Self-pay | Admitting: *Deleted

## 2011-04-03 LAB — KAPPA/LAMBDA LIGHT CHAINS
Kappa free light chain: 2.57 mg/dL — ABNORMAL HIGH (ref 0.33–1.94)
Kappa:Lambda Ratio: 2.99 — ABNORMAL HIGH (ref 0.26–1.65)
Lambda Free Lght Chn: 0.86 mg/dL (ref 0.57–2.63)

## 2011-04-03 NOTE — Telephone Encounter (Signed)
Faxed office note to the above #. Pt made aware

## 2011-04-04 NOTE — Letter (Signed)
April 03, 2011     NAME:  [name], [name] MRN:  [id number] DOB:  June 23, 1945  Dear Milford Cage or Madam:  [name] is a patient at the [facility] in [facility].  She has a history of multiple myeloma.  She underwent bone marrow transplant.  Unfortunately, I think she is probably suffering from delayed complications from the stem cell transplant.  She has a lot of muscle and bone pain.  This is something that can be seen after transplant.  I suspect that it is probably going to be hard for her to work.  She just really has little energy.  She does hurt quite a bit.  She does get fatigued secondary to the pain issues.  I certainly do not see [name] being able to drive.  She just is not physically capable for doing this.  I would like to request disability be continued for [name].  I think that she is disabled.  I just do not see how she is going to be able to come around from her complications in the near future.  Respectfully yours,    Josph Macho, M.D.  PRE/MEDQ  D:  04/03/2011  T:  04/03/2011  Job:  [id number]

## 2011-05-18 ENCOUNTER — Other Ambulatory Visit: Payer: Self-pay | Admitting: *Deleted

## 2011-05-18 DIAGNOSIS — K219 Gastro-esophageal reflux disease without esophagitis: Secondary | ICD-10-CM

## 2011-05-18 MED ORDER — PANTOPRAZOLE SODIUM 40 MG PO TBEC: 40.0000 mg | DELAYED_RELEASE_TABLET | Freq: Every day | ORAL | Status: DC

## 2011-07-26 ENCOUNTER — Ambulatory Visit (HOSPITAL_BASED_OUTPATIENT_CLINIC_OR_DEPARTMENT_OTHER): Payer: 59 | Admitting: Hematology & Oncology

## 2011-07-26 ENCOUNTER — Other Ambulatory Visit (HOSPITAL_BASED_OUTPATIENT_CLINIC_OR_DEPARTMENT_OTHER): Payer: 59 | Admitting: Lab

## 2011-07-26 ENCOUNTER — Ambulatory Visit (HOSPITAL_BASED_OUTPATIENT_CLINIC_OR_DEPARTMENT_OTHER): Payer: 59

## 2011-07-26 DIAGNOSIS — C9000 Multiple myeloma not having achieved remission: Secondary | ICD-10-CM

## 2011-07-26 DIAGNOSIS — M2550 Pain in unspecified joint: Secondary | ICD-10-CM

## 2011-07-26 DIAGNOSIS — Z9484 Stem cells transplant status: Secondary | ICD-10-CM

## 2011-07-26 LAB — CBC WITH DIFFERENTIAL (CANCER CENTER ONLY)
BASO#: 0 10*3/uL (ref 0.0–0.2)
EOS%: 2.8 % (ref 0.0–7.0)
HCT: 39.7 % (ref 34.8–46.6)
HGB: 13.2 g/dL (ref 11.6–15.9)
LYMPH#: 1.7 10*3/uL (ref 0.9–3.3)
MCHC: 33.2 g/dL (ref 32.0–36.0)
MONO#: 0.7 10*3/uL (ref 0.1–0.9)
NEUT#: 3.2 10*3/uL (ref 1.5–6.5)
RBC: 4.27 10*6/uL (ref 3.70–5.32)
WBC: 5.8 10*3/uL (ref 3.9–10.0)

## 2011-07-26 MED ORDER — ZOLEDRONIC ACID 4 MG/100ML IV SOLN
4.0000 mg | Freq: Once | INTRAVENOUS | Status: AC
  Administered 2011-07-26: 4 mg via INTRAVENOUS
  Filled 2011-07-26: qty 100

## 2011-07-26 NOTE — Progress Notes (Signed)
Diagnosis: 1. IgG kappa myeloma                    2. 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 comes in for her 4 month followup.  she is still trying to deal with her ex-husband. He is a Naval architect but apparently has been doing more on the road and is driving a truck. He has a few illegitimate children that she found out about. This is all about legal issues now.   When we last saw her in November, there was no monoclonal spike in her serum. Her IgG level was 1380 mg/dL.  He is trying to keep active. Her arthralgias to prevent her from walking. He is hard for her to and exercise on a daily basis. She does have some mild fatigue. Her appetite is okay.  Her physical exam this is a well-developed well-nourished black female in no obvious distress. Vital signs were temperature of 97 pulse 79 spot rate 16 blood pressure 118/77. Weight is 172 pounds.  Head and neck exams to the normocephalic atraumatic skull. There are no ocular or oral lesions. There are no palpable cervical or supraclavicular lymph nodes. Lungs are clear bilaterally. Cardiac exam regular rate and rhythm no murmurs or bruits. The bowels are soft good bowel sounds. There is a palpable abdominal mass. There is no fluid wave. There is no palpable hepatospleno megaly back exam no tenderness of the spine or hips. Extremities shows tenderness over the long bones. She has some decreased range of motion of her joints. Skin exam no rashes ecchymosis or petechia. Neurological exam shows no focal neurological deficits.  Laboratory studies his [place] count 5.8 hemoglobin 13.2 hematocrit 39.7 plate count is 161.  Impression: Ms. Heyward is a 66 year old African American female who I. IgG kappa myeloma. She did undergo a stem cell transplant at [facility] back in 2006. She's been in remission since. She has suffered some further complications of transplant.  She is managing okay.  We will give her Zometa today. We will bring her back in 4 months.

## 2011-07-28 LAB — IRON AND TIBC
%SAT: 28 % (ref 20–55)
Iron: 78 ug/dL (ref 42–145)
UIBC: 204 ug/dL (ref 125–400)

## 2011-07-28 LAB — COMPREHENSIVE METABOLIC PANEL
BUN: 11 mg/dL (ref 6–23)
CO2: 24 mEq/L (ref 19–32)
Creatinine, Ser: 1.18 mg/dL — ABNORMAL HIGH (ref 0.50–1.10)
Glucose, Bld: 69 mg/dL — ABNORMAL LOW (ref 70–99)
Total Bilirubin: 0.4 mg/dL (ref 0.3–1.2)

## 2011-07-28 LAB — PROTEIN ELECTROPHORESIS, SERUM, WITH REFLEX
Albumin ELP: 52.9 % — ABNORMAL LOW (ref 55.8–66.1)
Alpha-1-Globulin: 4.8 % (ref 2.9–4.9)
Alpha-2-Globulin: 12.1 % — ABNORMAL HIGH (ref 7.1–11.8)
Beta 2: 7 % — ABNORMAL HIGH (ref 3.2–6.5)
Beta Globulin: 5.6 % (ref 4.7–7.2)
Total Protein, Serum Electrophoresis: 7.6 g/dL (ref 6.0–8.3)

## 2011-07-28 LAB — KAPPA/LAMBDA LIGHT CHAINS: Kappa free light chain: 2.33 mg/dL — ABNORMAL HIGH (ref 0.33–1.94)

## 2011-07-28 LAB — IGG, IGA, IGM: IgG (Immunoglobin G), Serum: 1410 mg/dL (ref 690–1700)

## 2011-08-01 ENCOUNTER — Telehealth: Payer: Self-pay | Admitting: *Deleted

## 2011-08-01 NOTE — Telephone Encounter (Signed)
Called patient to let her know that there was no obvious myeloma noted in bloodwork per dr. Myna Hidalgo

## 2011-08-01 NOTE — Telephone Encounter (Signed)
Message copied by Anselm Jungling on Tue Aug 01, 2011 12:52 PM ------      Message from: Arlan Organ R      Created: Mon Jul 31, 2011  6:56 PM       Call- no obvious myeloma.  pete

## 2011-09-22 ENCOUNTER — Other Ambulatory Visit: Payer: Self-pay | Admitting: *Deleted

## 2011-09-22 DIAGNOSIS — K219 Gastro-esophageal reflux disease without esophagitis: Secondary | ICD-10-CM

## 2011-09-22 MED ORDER — PANTOPRAZOLE SODIUM 40 MG PO TBEC: 40.0000 mg | DELAYED_RELEASE_TABLET | Freq: Every day | ORAL | Status: DC

## 2011-10-20 ENCOUNTER — Ambulatory Visit (INDEPENDENT_AMBULATORY_CARE_PROVIDER_SITE_OTHER): Payer: 59 | Admitting: Internal Medicine

## 2011-10-20 VITALS — BP 120/70 | HR 88 | Ht 62.0 in | Wt 178.0 lb

## 2011-10-20 DIAGNOSIS — R195 Other fecal abnormalities: Secondary | ICD-10-CM

## 2011-10-20 MED ORDER — PEG-KCL-NACL-NASULF-NA ASC-C 100 G PO SOLR: 1.0000 | Freq: Once | ORAL | Status: DC

## 2011-10-20 NOTE — Patient Instructions (Signed)
You have been scheduled for a colonoscopy with propofol. Please follow written instructions given to you at your visit today.  Please pick up your prep kit at the pharmacy within the next 1-3 days.  

## 2011-10-20 NOTE — Progress Notes (Signed)
  Subjective:    Patient ID: [name], female    DOB: 1946-02-11, 66 y.o.   MRN: [id number] Referred by: [name], MD   HPI This is a very pleasant middle-aged African American woman who was found to have a heme positive stool on immune based Hemoccult testing. Last colonoscopy was in 2006 and was unremarkable. She also had an upper endoscopy than that showed esophagitis and gastritis. She has had some constipation problems related to [facility] use though she uses a stool softener it's not significantly symptomatic. She has had some bright red blood on the toilet paper at times with wiping after hard stools. That has not been occurring lately since the use of a stool softener. She has had some intermittent loss of appetite and she thinks her weight has declined somewhat, she has a situational stress her and that she is going through a divorce and she believes that is responsible for these symptoms. Pantoprazole controls heartburn and GERD symptoms. All other GI review of systems is negative at this time.  Medications, allergies, past medical history, past surgical history, family history and social history are reviewed and updated in the EMR.   Review of Systems This is positive for those things mentioned in the history of present illness, she is having some anxiety and stress issues related to the divorce. All other review of systems negative.    Objective:   Physical Exam General:  Well-developed, well-nourished and in no acute distress Eyes:  anicteric. ENT:   Mouth and posterior pharynx free of lesions.  Neck:   supple w/o thyromegaly or mass.  Lungs: Clear to auscultation bilaterally. Heart:  S1S2, no rubs, murmurs, gallops. Abdomen:  soft, non-tender, no hepatosplenomegaly, hernia, or mass and BS+.  Rectal: deferred Lymph:  no cervical or supraclavicular adenopathy. Extremities:   no edema Psych:  appropriate mood and  Affect.   Data Reviewed: Office notes from primary care  provider, 10/13/2011. HEENT no sure positive stool, immune based. Hemoglobin 12.9 with MCV 96 and white count 4.4. Comprehensive metabolic panel is normal except for glucose 118. TSH normal at 3.48. Vitamin D 31.7.        Assessment & Plan:   1. Heme + stool - immune-based    Cause not clear, she certainly could have hemorrhoids but colorectal neoplasia should be excluded. Her constipation issues to see medication related and respond to stool softener.  I recommended a colonoscopy. The risks and benefits as well as alternatives of endoscopic procedure(s) have been discussed and reviewed. All questions answered. The patient agrees to proceed.  I appreciate the opportunity to care for this patient.   CC: [name], MD

## 2011-10-23 ENCOUNTER — Encounter: Payer: 59 | Admitting: Internal Medicine

## 2011-10-26 ENCOUNTER — Other Ambulatory Visit: Payer: Self-pay | Admitting: Internal Medicine

## 2011-10-26 ENCOUNTER — Telehealth: Payer: Self-pay | Admitting: Internal Medicine

## 2011-10-26 ENCOUNTER — Ambulatory Visit (AMBULATORY_SURGERY_CENTER): Payer: 59 | Admitting: Internal Medicine

## 2011-10-26 ENCOUNTER — Encounter: Payer: Self-pay | Admitting: Internal Medicine

## 2011-10-26 VITALS — BP 130/71 | HR 76 | Temp 97.2°F | Resp 18 | Ht 62.0 in | Wt 178.0 lb

## 2011-10-26 DIAGNOSIS — D126 Benign neoplasm of colon, unspecified: Secondary | ICD-10-CM

## 2011-10-26 DIAGNOSIS — R195 Other fecal abnormalities: Secondary | ICD-10-CM

## 2011-10-26 DIAGNOSIS — K635 Polyp of colon: Secondary | ICD-10-CM

## 2011-10-26 MED ORDER — SODIUM CHLORIDE 0.9 % IV SOLN: 500.0000 mL | INTRAVENOUS | Status: DC

## 2011-10-26 NOTE — Patient Instructions (Signed)
Discharge instructions given with verbal understanding. Handout on polyps given. Resume previous medications. YOU HAD AN ENDOSCOPIC PROCEDURE TODAY AT THE [facility]: Refer to the procedure report that was given to you for any specific questions about what was found during the examination.  If the procedure report does not answer your questions, please call your gastroenterologist to clarify.  If you requested that your care partner not be given the details of your procedure findings, then the procedure report has been included in a sealed envelope for you to review at your convenience later.  YOU SHOULD EXPECT: Some feelings of bloating in the abdomen. Passage of more gas than usual.  Walking can help get rid of the air that was put into your GI tract during the procedure and reduce the bloating. If you had a lower endoscopy (such as a colonoscopy or flexible sigmoidoscopy) you may notice spotting of blood in your stool or on the toilet paper. If you underwent a bowel prep for your procedure, then you may not have a normal bowel movement for a few days.  DIET: Your first meal following the procedure should be a light meal and then it is ok to progress to your normal diet.  A half-sandwich or bowl of soup is an example of a good first meal.  Heavy or fried foods are harder to digest and may make you feel nauseous or bloated.  Likewise meals heavy in dairy and vegetables can cause extra gas to form and this can also increase the bloating.  Drink plenty of fluids but you should avoid alcoholic beverages for 24 hours.  ACTIVITY: Your care partner should take you home directly after the procedure.  You should plan to take it easy, moving slowly for the rest of the day.  You can resume normal activity the day after the procedure however you should NOT DRIVE or use heavy machinery for 24 hours (because of the sedation medicines used during the test).    SYMPTOMS TO REPORT IMMEDIATELY: A  gastroenterologist can be reached at any hour.  During normal business hours, 8:30 AM to 5:00 PM Monday through Friday, call [phone].  After hours and on weekends, please call the GI answering service at [phone] who will take a message and have the physician on call contact you.   Following lower endoscopy (colonoscopy or flexible sigmoidoscopy):  Excessive amounts of blood in the stool  Significant tenderness or worsening of abdominal pains  Swelling of the abdomen that is new, acute  Fever of 100F or higher  FOLLOW UP: If any biopsies were taken you will be contacted by phone or by letter within the next 1-3 weeks.  Call your gastroenterologist if you have not heard about the biopsies in 3 weeks.  Our staff will call the home number listed on your records the next business day following your procedure to check on you and address any questions or concerns that you may have at that time regarding the information given to you following your procedure. This is a courtesy call and so if there is no answer at the home number and we have not heard from you through the emergency physician on call, we will assume that you have returned to your regular daily activities without incident.  SIGNATURES/CONFIDENTIALITY: You and/or your care partner have signed paperwork which will be entered into your electronic medical record.  These signatures attest to the fact that that the information above on your After Visit Summary has   been reviewed and is understood.  Full responsibility of the confidentiality of this discharge information lies with you and/or your care-partner. 

## 2011-10-26 NOTE — Op Note (Signed)
[facility] [address] [place], [place]  [address]  COLONOSCOPY PROCEDURE REPORT  PATIENT:  [name], [name]  MR#:  [id number] BIRTHDATE:  August 26, 1945, 66 yrs. old  GENDER:  female ENDOSCOPIST:  Iva Boop, MD, [facility] REF. BY:  Creola Corn, M.D. PROCEDURE DATE:  10/26/2011 PROCEDURE:  Colonoscopy with biopsy and snare polypectomy ASA CLASS:  Class II INDICATIONS:  heme positive stool (immune-based) MEDICATIONS:   These medications were titrated to patient response per physician's verbal order, MAC sedation, administered by CRNA, propofol (Diprivan) 300 mg IV  DESCRIPTION OF PROCEDURE:   After the risks benefits and alternatives of the procedure were thoroughly explained, informed consent was obtained.  Digital rectal exam was performed and revealed no abnormalities.   The LB [id number]-[id number] endoscope was introduced through the anus and advanced to the cecum, which was identified by both the appendix and ileocecal valve, without limitations.  The quality of the prep was good, using MoviPrep. The instrument was then slowly withdrawn as the colon was fully examined. <<PROCEDUREIMAGES>>  FINDINGS:  Four polyps were found. Ascending (2mm), descending (3mm) both removed cold biopsy. Sigmoid (2 - 4 mm) removed cold snare. All sent to pathology.  This was otherwise a normal examination of the colon.   Retroflexed views in the rectum revealed no abnormalities.    The time to cecum = 1:38 minutes. The scope was then withdrawn in 18:00 minutes from the cecum and the procedure completed. COMPLICATIONS:  None ENDOSCOPIC IMPRESSION: 1) Four polyps removed, largest 4 mm 2) Otherwise normal examination  REPEAT EXAM:  In for Colonoscopy, pending biopsy results. Will be on colonoscopy follow-up and routine hemoccults not necessary.  Iva Boop, MD, [facility]  CC:  Creola Corn, MD and The Patient  n. eSIGNED:   Iva Boop at 10/26/2011 10:19 AM  [place], [place],  [phone]

## 2011-10-26 NOTE — Progress Notes (Signed)
Patient did not have preoperative order for IV antibiotic SSI prophylaxis. (G8918)  Patient did not experience any of the following events: a burn prior to discharge; a fall within the facility; wrong site/side/patient/procedure/implant event; or a hospital transfer or hospital admission upon discharge from the facility. (G8907)  

## 2011-10-26 NOTE — Telephone Encounter (Signed)
Received 27 pages from [facility], sent to Dr. Leone Payor. sd 10/26/11

## 2011-10-27 ENCOUNTER — Telehealth: Payer: Self-pay

## 2011-10-27 NOTE — Telephone Encounter (Signed)
  Follow up Call-  Call back number 10/26/2011  Post procedure Call Back phone  # [phone]  Permission to leave phone message Yes     Patient questions:  Do you have a fever, pain , or abdominal swelling? no Pain Score  0 *  Have you tolerated food without any problems? yes  Have you been able to return to your normal activities? yes  Do you have any questions about your discharge instructions: Diet   no Medications  no Follow up visit  no  Do you have questions or concerns about your Care? no  Actions: * If pain score is 4 or above: No action needed, pain <4.

## 2011-11-02 ENCOUNTER — Encounter: Payer: Self-pay | Admitting: Internal Medicine

## 2011-11-02 DIAGNOSIS — Z860100 Personal history of colon polyps, unspecified: Secondary | ICD-10-CM

## 2011-11-02 HISTORY — DX: Personal history of colonic polyps: Z86.010

## 2011-11-02 HISTORY — DX: Personal history of colon polyps, unspecified: Z86.0100

## 2011-11-02 NOTE — Progress Notes (Signed)
Quick Note:  2 diminutive sessile serrated adenomas Repeat colon 3 years about 10/2014 ______

## 2011-11-30 ENCOUNTER — Other Ambulatory Visit (HOSPITAL_BASED_OUTPATIENT_CLINIC_OR_DEPARTMENT_OTHER): Payer: 59 | Admitting: Lab

## 2011-11-30 ENCOUNTER — Ambulatory Visit (HOSPITAL_BASED_OUTPATIENT_CLINIC_OR_DEPARTMENT_OTHER): Payer: 59

## 2011-11-30 ENCOUNTER — Ambulatory Visit (HOSPITAL_BASED_OUTPATIENT_CLINIC_OR_DEPARTMENT_OTHER): Payer: 59 | Admitting: Hematology & Oncology

## 2011-11-30 VITALS — BP 132/77 | HR 69 | Temp 98.4°F | Ht 62.0 in | Wt 181.0 lb

## 2011-11-30 DIAGNOSIS — C9000 Multiple myeloma not having achieved remission: Secondary | ICD-10-CM

## 2011-11-30 LAB — CBC WITH DIFFERENTIAL (CANCER CENTER ONLY)
BASO%: 0.5 % (ref 0.0–2.0)
EOS%: 3.2 % (ref 0.0–7.0)
MCH: 31.2 pg (ref 26.0–34.0)
MCHC: 33.5 g/dL (ref 32.0–36.0)
MONO%: 13.2 % — ABNORMAL HIGH (ref 0.0–13.0)
NEUT#: 2 10*3/uL (ref 1.5–6.5)
Platelets: 299 10*3/uL (ref 145–400)

## 2011-11-30 MED ORDER — SODIUM CHLORIDE 0.9 % IV SOLN
Freq: Once | INTRAVENOUS | Status: AC
  Administered 2011-11-30: 12:00:00 via INTRAVENOUS

## 2011-11-30 MED ORDER — ZOLEDRONIC ACID 4 MG/100ML IV SOLN
4.0000 mg | Freq: Once | INTRAVENOUS | Status: AC
  Administered 2011-11-30: 4 mg via INTRAVENOUS
  Filled 2011-11-30: qty 100

## 2011-11-30 MED ORDER — SODIUM CHLORIDE 0.9 % IJ SOLN
3.0000 mL | Freq: Once | INTRAMUSCULAR | Status: DC | PRN
  Filled 2011-11-30: qty 10

## 2011-11-30 NOTE — Progress Notes (Signed)
This office note has been dictated.

## 2011-11-30 NOTE — Patient Instructions (Signed)
Zoledronic Acid injection (Hypercalcemia, Oncology) What is this medicine? ZOLEDRONIC ACID (ZOE le dron ik AS id) lowers the amount of calcium loss from bone. It is used to treat too much calcium in your blood from cancer. It is also used to prevent complications of cancer that has spread to the bone. This medicine may be used for other purposes; ask your health care provider or pharmacist if you have questions. What should I tell my health care provider before I take this medicine? They need to know if you have any of these conditions: -aspirin-sensitive asthma -dental disease -kidney disease -an unusual or allergic reaction to zoledronic acid, other medicines, foods, dyes, or preservatives -pregnant or trying to get pregnant -breast-feeding How should I use this medicine? This medicine is for infusion into a vein. It is given by a health care professional in a hospital or clinic setting. Talk to your pediatrician regarding the use of this medicine in children. Special care may be needed. Overdosage: If you think you have taken too much of this medicine contact a poison control center or emergency room at once. NOTE: This medicine is only for you. Do not share this medicine with others. What if I miss a dose? It is important not to miss your dose. Call your doctor or health care professional if you are unable to keep an appointment. What may interact with this medicine? -certain antibiotics given by injection -NSAIDs, medicines for pain and inflammation, like ibuprofen or naproxen -some diuretics like bumetanide, furosemide -teriparatide -thalidomide This list may not describe all possible interactions. Give your health care provider a list of all the medicines, herbs, non-prescription drugs, or dietary supplements you use. Also tell them if you smoke, drink alcohol, or use illegal drugs. Some items may interact with your medicine. What should I watch for while using this medicine? Visit  your doctor or health care professional for regular checkups. It may be some time before you see the benefit from this medicine. Do not stop taking your medicine unless your doctor tells you to. Your doctor may order blood tests or other tests to see how you are doing. Women should inform their doctor if they wish to become pregnant or think they might be pregnant. There is a potential for serious side effects to an unborn child. Talk to your health care professional or pharmacist for more information. You should make sure that you get enough calcium and vitamin D while you are taking this medicine. Discuss the foods you eat and the vitamins you take with your health care professional. Some people who take this medicine have severe bone, joint, and/or muscle pain. This medicine may also increase your risk for a broken thigh bone. Tell your doctor right away if you have pain in your upper leg or groin. Tell your doctor if you have any pain that does not go away or that gets worse. What side effects may I notice from receiving this medicine? Side effects that you should report to your doctor or health care professional as soon as possible: -allergic reactions like skin rash, itching or hives, swelling of the face, lips, or tongue -anxiety, confusion, or depression -breathing problems -changes in vision -feeling faint or lightheaded, falls -jaw burning, cramping, pain -muscle cramps, stiffness, or weakness -trouble passing urine or change in the amount of urine Side effects that usually do not require medical attention (report to your doctor or health care professional if they continue or are bothersome): -bone, joint, or muscle pain -  fever -hair loss -irritation at site where injected -loss of appetite -nausea, vomiting -stomach upset -tired This list may not describe all possible side effects. Call your doctor for medical advice about side effects. You may report side effects to FDA at  1-800-FDA-1088. Where should I keep my medicine? This drug is given in a hospital or clinic and will not be stored at home. NOTE: This sheet is a summary. It may not cover all possible information. If you have questions about this medicine, talk to your doctor, pharmacist, or health care provider.  2012, Elsevier/Gold Standard. (11/04/2010 9:06:58 AM) 

## 2011-12-01 NOTE — Progress Notes (Signed)
CC:   Erin Pounds, MD  DIAGNOSIS: 1. IgG kappa myeloma-clinical remission. 2. Post transplant polyarthropathy.  CURRENT THERAPY:  Zometa 4 mg IV every 4 months.  INTERIM HISTORY:  [name] comes in for followup.  She is doing quite well.  We last saw her actually back in November.  She is doing better than when we last saw her.  She now has a new man in her life. She is much happier now. She is still bothered by the polyarthropathy.  This has been persistent ever since her bone marrow transplant.  She is trying to stay active. The activity and exercise does seem to help a little bit.  When we last saw her, there was no problems with respect to myeloma recurrence.  Her monoclonal spike was negative in her serum.  Her IgG level was 1410 mg/dL.  Her serum kappa light chain was 2.33 mg/dL. She has had no problems with bowels or bladder.  There has been no cough.  There has been no leg swelling.  She has had no rashes.  PHYSICAL EXAMINATION:  General:  This is a well-developed, well- nourished African American female in no obvious distress.  Vital signs: Temperature of 98.4, pulse 69, respiratory rate 20, blood pressure 132/77, weight is 181.  Head and neck:  Normocephalic, atraumatic skull. There are no ocular or oral lesions.  There are no palpable cervical or supraclavicular lymph nodes.  Lungs:  Clear to percussion and auscultation bilaterally.  Cardiac:  Regular rate and rhythm with a normal S1 and S2.  There are no murmurs, rubs or bruits.  Abdomen:  Soft with good bowel sounds.  There is no palpable abdominal mass.  There is no fluid wave.  There is no palpable hepatosplenomegaly.  No tenderness over the spine, ribs, or hips.  Neurologic:  Shows no focal neurological deficits.  Skin:  No rashes, ecchymosis or petechia.  LABORATORY STUDIES:  White cell count 4.1, hemoglobin 12.8, hematocrit 38.2, platelet count 299.  IMPRESSION:  [name] is a 66 year old, African  American female with history of IgG kappa myeloma.  She ultimately underwent stem cell transplant at [facility].  She had a stem cell transplant at [facility], I think, back in October of 2006.  Again, I see no evidence of recurrence by her lab work.  She is on maintenance Zometa.  I do believe that this is providing some benefit for [place].  We will continue her Zometa every 4 months.  We will see her back when she has her Zometa.    ______________________________ Josph Macho, M.D. PRE/MEDQ  D:  11/30/2011  T:  12/01/2011  Job:  [id number]

## 2011-12-04 LAB — FERRITIN: Ferritin: 137 ng/mL (ref 10–291)

## 2011-12-04 LAB — PROTEIN ELECTROPHORESIS, SERUM, WITH REFLEX
Alpha-1-Globulin: 4.2 % (ref 2.9–4.9)
Beta 2: 6 % (ref 3.2–6.5)
Gamma Globulin: 16.9 % (ref 11.1–18.8)

## 2011-12-04 LAB — COMPREHENSIVE METABOLIC PANEL
ALT: 15 U/L (ref 0–35)
AST: 16 U/L (ref 0–37)
Albumin: 4.3 g/dL (ref 3.5–5.2)
Alkaline Phosphatase: 91 U/L (ref 39–117)
Potassium: 4.3 mEq/L (ref 3.5–5.3)
Sodium: 140 mEq/L (ref 135–145)
Total Protein: 7.7 g/dL (ref 6.0–8.3)

## 2011-12-04 LAB — IRON AND TIBC
TIBC: 318 ug/dL (ref 250–470)
UIBC: 233 ug/dL (ref 125–400)

## 2011-12-04 LAB — KAPPA/LAMBDA LIGHT CHAINS: Kappa:Lambda Ratio: 1.34 (ref 0.26–1.65)

## 2011-12-04 LAB — IGG, IGA, IGM: IgM, Serum: 135 mg/dL (ref 52–322)

## 2011-12-06 ENCOUNTER — Telehealth: Payer: Self-pay | Admitting: *Deleted

## 2011-12-06 NOTE — Telephone Encounter (Signed)
Called patient to let her know that there is still no active myeloma and her iron studies look good per dr. Myna Hidalgo

## 2011-12-06 NOTE — Telephone Encounter (Signed)
Message copied by Anselm Jungling on Wed Dec 06, 2011  2:30 PM ------      Message from: Arlan Organ R      Created: Wed Dec 06, 2011  1:19 PM       Please call and tell her that there is still no active myeloma. Her iron studies also look good. Thanks. Cindee Lame

## 2012-01-30 ENCOUNTER — Other Ambulatory Visit: Payer: Self-pay | Admitting: *Deleted

## 2012-01-30 DIAGNOSIS — K219 Gastro-esophageal reflux disease without esophagitis: Secondary | ICD-10-CM

## 2012-01-30 MED ORDER — PANTOPRAZOLE SODIUM 40 MG PO TBEC: 40.0000 mg | DELAYED_RELEASE_TABLET | Freq: Every day | ORAL | Status: DC

## 2012-01-30 NOTE — Telephone Encounter (Signed)
Received refill request for pantoprazole 40 mg daily from [facility]. Refilled via e-prescribe as this is a chronic med for the pt.

## 2012-02-14 DIAGNOSIS — C9000 Multiple myeloma not having achieved remission: Secondary | ICD-10-CM | POA: Diagnosis not present

## 2012-02-29 NOTE — Letter (Signed)
February 28, 2012     NAME:  [name], [name] MRN:  [id number] DOB:  10-Feb-1946  Dear Milford Cage or Madam:  This letter is in reference to Ms. Colony Washington.  She is a patient at the [facility].  She has a history of myeloma. She underwent a stem cell transplantation.  Unfortunately, she has suffered from polyarthropathy from the transplant.  This mostly is from high-dose chemotherapy that she received from her transplant.  This arthropathy has caused significant problems for her.  She has a lot of stiffness and pain in her joints.  She has had to deal with this chronically since her transplant.  She has had worsening symptoms.  We have her on Zometa, which is a bone hardener to try to help with her arthropathies, but so far this has not been too successful.  Because of her polyarthropathy, she is really not able to work.  She is only able to do activities at a short period of time.  She may be able to stay active for maybe 10-15 minutes and then she needs to rest.  I feel that [name] is disabled because of this polyarthropathy. She has a tough time dealing with the pain.  She tries to stay active but, again, this has been very difficult for her.  Again, it has recognized as a complication from bone marrow transplants with joint issues.  I sincerely hope that [name] does qualify for a full disability. She is entitled to this from my point of view.  Thankfully, her myeloma, for which she had the stem cell transplant, has not come back and she is in remission from this.  If there is any further medical information that is needed for [name], please feel free to let us know at [phone].  Respectfully yours,    Josph Macho, M.D.  PRE/MEDQ  D:  02/28/2012  T:  02/29/2012  Job:  [id number]

## 2012-04-03 ENCOUNTER — Ambulatory Visit: Payer: Medicare Other

## 2012-04-03 ENCOUNTER — Ambulatory Visit (HOSPITAL_BASED_OUTPATIENT_CLINIC_OR_DEPARTMENT_OTHER): Payer: Self-pay | Admitting: Hematology & Oncology

## 2012-04-03 ENCOUNTER — Other Ambulatory Visit: Payer: Self-pay | Admitting: Lab

## 2012-04-03 VITALS — BP 134/67 | HR 73 | Temp 97.7°F | Resp 18 | Ht 62.0 in | Wt 188.0 lb

## 2012-04-03 DIAGNOSIS — C9000 Multiple myeloma not having achieved remission: Secondary | ICD-10-CM

## 2012-04-03 DIAGNOSIS — M130 Polyarthritis, unspecified: Secondary | ICD-10-CM | POA: Diagnosis not present

## 2012-04-03 DIAGNOSIS — C9001 Multiple myeloma in remission: Secondary | ICD-10-CM

## 2012-04-03 LAB — CBC WITH DIFFERENTIAL (CANCER CENTER ONLY)
BASO#: 0 10*3/uL (ref 0.0–0.2)
BASO%: 0.6 % (ref 0.0–2.0)
HCT: 39.7 % (ref 34.8–46.6)
HGB: 13 g/dL (ref 11.6–15.9)
LYMPH#: 1.5 10*3/uL (ref 0.9–3.3)
LYMPH%: 28.2 % (ref 14.0–48.0)
MCHC: 32.7 g/dL (ref 32.0–36.0)
MCV: 95 fL (ref 81–101)
MONO#: 0.6 10*3/uL (ref 0.1–0.9)
NEUT%: 58.7 % (ref 39.6–80.0)
RDW: 13.3 % (ref 11.1–15.7)

## 2012-04-03 MED ORDER — SODIUM CHLORIDE 0.9 % IJ SOLN
3.0000 mL | Freq: Once | INTRAMUSCULAR | Status: DC | PRN
  Filled 2012-04-03: qty 10

## 2012-04-03 MED ORDER — ZOLEDRONIC ACID 4 MG/5ML IV CONC
4.0000 mg | Freq: Once | INTRAVENOUS | Status: AC
  Administered 2012-04-03: 4 mg via INTRAVENOUS
  Filled 2012-04-03: qty 5

## 2012-04-03 MED ORDER — SODIUM CHLORIDE 0.9 % IV SOLN
Freq: Once | INTRAVENOUS | Status: AC
  Administered 2012-04-03: 13:00:00 via INTRAVENOUS

## 2012-04-03 NOTE — Patient Instructions (Addendum)
Zoledronic Acid injection (Hypercalcemia, Oncology) What is this medicine? ZOLEDRONIC ACID (ZOE le dron ik AS id) lowers the amount of calcium loss from bone. It is used to treat too much calcium in your blood from cancer. It is also used to prevent complications of cancer that has spread to the bone. This medicine may be used for other purposes; ask your health care provider or pharmacist if you have questions. What should I tell my health care provider before I take this medicine? They need to know if you have any of these conditions: -aspirin-sensitive asthma -dental disease -kidney disease -an unusual or allergic reaction to zoledronic acid, other medicines, foods, dyes, or preservatives -pregnant or trying to get pregnant -breast-feeding How should I use this medicine? This medicine is for infusion into a vein. It is given by a health care professional in a hospital or clinic setting. Talk to your pediatrician regarding the use of this medicine in children. Special care may be needed. Overdosage: If you think you have taken too much of this medicine contact a poison control center or emergency room at once. NOTE: This medicine is only for you. Do not share this medicine with others. What if I miss a dose? It is important not to miss your dose. Call your doctor or health care professional if you are unable to keep an appointment. What may interact with this medicine? -certain antibiotics given by injection -NSAIDs, medicines for pain and inflammation, like ibuprofen or naproxen -some diuretics like bumetanide, furosemide -teriparatide -thalidomide This list may not describe all possible interactions. Give your health care provider a list of all the medicines, herbs, non-prescription drugs, or dietary supplements you use. Also tell them if you smoke, drink alcohol, or use illegal drugs. Some items may interact with your medicine. What should I watch for while using this medicine? Visit  your doctor or health care professional for regular checkups. It may be some time before you see the benefit from this medicine. Do not stop taking your medicine unless your doctor tells you to. Your doctor may order blood tests or other tests to see how you are doing. Women should inform their doctor if they wish to become pregnant or think they might be pregnant. There is a potential for serious side effects to an unborn child. Talk to your health care professional or pharmacist for more information. You should make sure that you get enough calcium and vitamin D while you are taking this medicine. Discuss the foods you eat and the vitamins you take with your health care professional. Some people who take this medicine have severe bone, joint, and/or muscle pain. This medicine may also increase your risk for a broken thigh bone. Tell your doctor right away if you have pain in your upper leg or groin. Tell your doctor if you have any pain that does not go away or that gets worse. What side effects may I notice from receiving this medicine? Side effects that you should report to your doctor or health care professional as soon as possible: -allergic reactions like skin rash, itching or hives, swelling of the face, lips, or tongue -anxiety, confusion, or depression -breathing problems -changes in vision -feeling faint or lightheaded, falls -jaw burning, cramping, pain -muscle cramps, stiffness, or weakness -trouble passing urine or change in the amount of urine Side effects that usually do not require medical attention (report to your doctor or health care professional if they continue or are bothersome): -bone, joint, or muscle pain -  fever -hair loss -irritation at site where injected -loss of appetite -nausea, vomiting -stomach upset -tired This list may not describe all possible side effects. Call your doctor for medical advice about side effects. You may report side effects to FDA at  1-800-FDA-1088. Where should I keep my medicine? This drug is given in a hospital or clinic and will not be stored at home. NOTE: This sheet is a summary. It may not cover all possible information. If you have questions about this medicine, talk to your doctor, pharmacist, or health care provider.  2012, Elsevier/Gold Standard. (11/04/2010 9:06:58 AM) 

## 2012-04-03 NOTE — Progress Notes (Signed)
This office note has been dictated.

## 2012-04-04 ENCOUNTER — Telehealth: Payer: Self-pay | Admitting: Hematology & Oncology

## 2012-04-04 ENCOUNTER — Other Ambulatory Visit: Payer: Self-pay | Admitting: *Deleted

## 2012-04-04 DIAGNOSIS — K219 Gastro-esophageal reflux disease without esophagitis: Secondary | ICD-10-CM

## 2012-04-04 MED ORDER — PANTOPRAZOLE SODIUM 40 MG PO TBEC
40.0000 mg | DELAYED_RELEASE_TABLET | Freq: Every day | ORAL | Status: DC
Start: 2012-04-04 — End: 2012-08-22

## 2012-04-04 NOTE — Telephone Encounter (Signed)
Mailed march schedule to pt °

## 2012-04-04 NOTE — Progress Notes (Signed)
CC:   [name], MD  DIAGNOSES: 1. IgG kappa myeloma, clinical remission post bone marrow transplant. 2. Post marrow transplant polyarthropathy.  CURRENT THERAPY:  Zometa 4 mg IV q.4 months.  INTERIM HISTORY:  [name] comes in for her followup.  We see her every 4 months.  Unfortunately every 4 months, I hear about her issues with her ex-husband and the idiotic things that he has done.  He apparently has not shown up for court dates.  He is not paying alimony. He has illegitimate children that he is not supporting.  He did have his passport taken so that he cannot leave the country.  She is not able to work.  She has polyarthropathy which is quite significant.  This occurred because of her high-dose chemotherapy and stem cell transplant.  I did write her a letter for disability a few weeks ago.  Her myeloma has not been a problem.  She has had no evidence of myeloma in her blood.  Her last monoclonal spike done back in July was negative.  She has had no rashes.  She has had no problem with bowels or bladder. She does get fatigued on occasion.  She is limited with her activities because of the polyarthropathy.  PHYSICAL EXAMINATION:  General:  This is a well-developed, well- nourished African American female in no obvious distress.  Vital signs: 97.7, pulse 73, respiratory rate 18, blood pressure 134/67.  Weight is 188.  Head and neck:  Normocephalic, atraumatic skull.  There are no ocular or oral lesions.  There are no palpable cervical or supraclavicular lymph nodes.  Lungs:  Clear bilaterally.  Cardiac: Regular rate and rhythm with a normal S1, S2.  There are no murmurs, rubs or bruits.  Abdomen:  Soft with good bowel sounds.  There is no fluid wave.  There is no palpable hepatosplenomegaly.  Back:  No tenderness over the spine, ribs, or hips.  Extremities:  Shows no clubbing, cyanosis or edema.  She has some decreased range motion of her joints.  Neurological:  Shows  no focal neurological deficits.  LABORATORY STUDIES:  White cell count 5.2, hemoglobin 13, hematocrit 39.7, platelet count 285.  IMPRESSION:  Ms. Burry is a 66 year old African American female with IgG kappa myeloma.  She has been in remission since transplant.  She had her transplant October 2006.  Again, she is on maintenance Zometa.  Again, I do feel that the Zometa is helping her.  I think the Zometa also might be helping with her joints a little bit.  We will plan to get her back in 4 more months.  I do not see that we need to do any interim lab work or x-rays until we see her again.    ______________________________ Josph Macho, M.D. PRE/MEDQ  D:  04/03/2012  T:  04/04/2012  Job:  [id number]

## 2012-04-05 LAB — IGG, IGA, IGM: IgG (Immunoglobin G), Serum: 1650 mg/dL (ref 690–1700)

## 2012-04-05 LAB — COMPREHENSIVE METABOLIC PANEL
BUN: 15 mg/dL (ref 6–23)
CO2: 26 mEq/L (ref 19–32)
Calcium: 9.6 mg/dL (ref 8.4–10.5)
Chloride: 103 mEq/L (ref 96–112)
Creatinine, Ser: 1.23 mg/dL — ABNORMAL HIGH (ref 0.50–1.10)
Glucose, Bld: 85 mg/dL (ref 70–99)

## 2012-04-05 LAB — PROTEIN ELECTROPHORESIS, SERUM, WITH REFLEX
Albumin ELP: 51.3 % — ABNORMAL LOW (ref 55.8–66.1)
Alpha-1-Globulin: 8.2 % — ABNORMAL HIGH (ref 2.9–4.9)
Beta 2: 6.4 % (ref 3.2–6.5)
Beta Globulin: 6.6 % (ref 4.7–7.2)

## 2012-04-11 ENCOUNTER — Encounter: Payer: Self-pay | Admitting: Family Medicine

## 2012-04-11 ENCOUNTER — Ambulatory Visit (INDEPENDENT_AMBULATORY_CARE_PROVIDER_SITE_OTHER): Payer: Self-pay | Admitting: Family Medicine

## 2012-04-11 ENCOUNTER — Telehealth: Payer: Self-pay | Admitting: Family Medicine

## 2012-04-11 VITALS — BP 122/72 | HR 76 | Temp 98.1°F | Ht 62.0 in | Wt 187.0 lb

## 2012-04-11 DIAGNOSIS — F329 Major depressive disorder, single episode, unspecified: Secondary | ICD-10-CM | POA: Diagnosis not present

## 2012-04-11 MED ORDER — BUPROPION HCL ER (SR) 100 MG PO TB12: 100.0000 mg | ORAL_TABLET | Freq: Two times a day (BID) | ORAL | Status: DC

## 2012-04-11 NOTE — Patient Instructions (Addendum)
[name],   Thank you very much for coming in to see me today. It was pleasure meeting you.   I searched but could not find the cost of the medication. It should be reasonable, let me know if it is not. Bupropion once in the AM and another 8 hrs later.   F/u in 2-3 weeks.   Dr. Armen Pickup

## 2012-04-11 NOTE — Telephone Encounter (Signed)
Opened in error

## 2012-04-21 ENCOUNTER — Encounter: Payer: Self-pay | Admitting: Family Medicine

## 2012-04-21 NOTE — Assessment & Plan Note (Signed)
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 Associated with decreased sexual arousal and insomnia. P: wellbutrin q AM and then 8 hrs later (stimulating if taken too later) F/u 2-3 weeks

## 2012-04-21 NOTE — Progress Notes (Signed)
Subjective:     Patient ID: [name], female   DOB: 01-22-1946, 66 y.o.   MRN: [id number]  HPI 66 yo F new patient presents to establish primary care. She previously received care at [facility] but she lost insurance coverage due to separation and divorce.   1. Low sex drive: x 3 months. Associated with insomnia. She is currently with a partner who treats her well. She desires sex but does not seem to enjoy it as much as usual. Admits to stress related to finances.   Past Medical History  Diagnosis Date  . Degenerative joint disease involving multiple joints 03/29/2011  . 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 Anemia   . GERD (gastroesophageal reflux disease)   . Vitamin D deficiency   . Hyperlipidemia   . Paget's bone disease   . History of bone cancer     chemo XRT, Dr. Myna Hidalgo  . DJD (degenerative joint disease)   . OA (osteoarthritis)   . Renal insufficiency     H/O resolved  . Multiple myeloma     IgG Kappa  . Herpes simplex esophagitis 2006  . HTN (hypertension) 2005  . Chronic pain of left knee      Review of Systems Patient Information Form: Screening and ROS  AUDIT-C Score: 0 Do you feel safe in relationships? yes PHQ-2:positive  Review of Symptoms  General:  Negative for nexplained weight loss, fever Skin: Negative for new or changing mole, sore that won't heal HEENT: Negative for trouble hearing, trouble seeing, ringing in ears, mouth sores, hoarseness, change in voice, dysphagia. CV:  Negative for chest pain, dyspnea, edema, palpitations Resp: Negative for cough, dyspnea, hemoptysis GI: Positive for constipation. Negative for nausea, vomiting, diarrhea, abdominal pain, melena, hematochezia. GU: Negative for dysuria, incontinence, urinary hesitance, hematuria, vaginal or penile discharge, polyuria, sexual difficulty, lumps in testicle or breasts MSK: Positive for muscle cramps or aches, joint pain or  swelling Neuro: Negative for headaches, weakness, numbness, dizziness, passing out/fainting Psych: Negative for depression, anxiety, memory problems    Objective:   Physical Exam BP 122/72  Pulse 76  Temp 98.1 F (36.7 C) (Oral)  Ht 5\' 2"  (1.575 m)  Wt 187 lb (84.823 kg)  BMI 34.20 kg/m2 General appearance: alert, cooperative and no distress Neck: no adenopathy, no carotid bruit, no JVD, supple, symmetrical, trachea midline and thyroid not enlarged, symmetric, no tenderness/mass/nodules Lungs: clear to auscultation bilaterally Heart: regular rate and rhythm, S1, S2 normal, no murmur, click, rub or gallop Abdomen: NABs, soft, NT/ND Ext: no edema      Assessment and Plan:

## 2012-05-01 ENCOUNTER — Encounter: Payer: Self-pay | Admitting: Family Medicine

## 2012-05-01 ENCOUNTER — Ambulatory Visit (INDEPENDENT_AMBULATORY_CARE_PROVIDER_SITE_OTHER): Payer: Medicare Other | Admitting: Family Medicine

## 2012-05-01 VITALS — BP 123/83 | HR 74 | Temp 97.9°F | Ht 62.0 in | Wt 191.4 lb

## 2012-05-01 DIAGNOSIS — Z024 Encounter for examination for driving license: Secondary | ICD-10-CM | POA: Insufficient documentation

## 2012-05-01 DIAGNOSIS — Z0289 Encounter for other administrative examinations: Secondary | ICD-10-CM

## 2012-05-01 LAB — POCT URINALYSIS DIPSTICK
Bilirubin, UA: NEGATIVE
Glucose, UA: NEGATIVE
Ketones, UA: NEGATIVE
Spec Grav, UA: 1.02

## 2012-05-01 LAB — POCT UA - MICROSCOPIC ONLY

## 2012-05-01 NOTE — Progress Notes (Signed)
Patient ID: [name], female   DOB: 1946-04-26, 66 y.o.   MRN: [id number] Commercial Driver Medical Examination   [name] is a 66 y.o. female who presents today for a commercial driver fitness determination physical exam. The patient reports no problems.  Review of Systems: General: Negative for nexplained weight loss, fever  Skin: Negative for new or changing mole, sore that won't heal  HEENT: Negative for trouble hearing, trouble seeing, ringing in ears, mouth sores, hoarseness, change in voice, dysphagia.  CV: Negative for chest pain, dyspnea, edema, palpitations  Resp: Negative for cough, dyspnea, hemoptysis  GI: Positive for constipation. Negative for nausea, vomiting, diarrhea, abdominal pain, melena, hematochezia.  GU: Negative for dysuria, incontinence, urinary hesitance, hematuria, vaginal or penile discharge, polyuria, sexual difficulty, lumps in testicle or breasts  MSK: Positive for muscle cramps or aches, joint pain or swelling  Neuro: Negative for headaches, weakness, numbness, dizziness, passing out/fainting  Psych: Negative for depression, anxiety, memory problems  Objective:    Vision:  Uncorrected Corrected Horizontal Field of Vision  Right Eye 20/40 unable to measure 80 degrees  Left Eye  20/25 unable to measure 80 degrees  Both Eyes  20/25 unable to measure    Applicant can recognize and distinguish among traffic control signals and devices showing standard red, green, and amber colors.   Monocular Vision?: No  Hearing:   500 Hz 1000 Hz 2000 Hz 4000 Hz  Right Ear  hears 40 dB hears 40 dB hears 40 dB hears 40 dB  Left Ear  hears 40 dB hears 40 dB hears 40 dB hears 40 dB  ISO to ASI= 26, 30, 41.5 Average = 32.5 in both ears.   BP 123/83  Pulse 74  Temp 97.9 F (36.6 C) (Oral)  Ht 5\' 2"  (1.575 m)  Wt 191 lb 6.4 oz (86.818 kg)  BMI 35.01 kg/m2  General Appearance:    Alert, cooperative, no distress, appears stated age  Head:    Normocephalic,  without obvious abnormality, atraumatic  Eyes:    PERRL, conjunctiva/corneas clear, EOM's intact, fundi    benign, both eyes  Ears:    Normal TM's and external ear canals, both ears  Nose:   Nares normal, septum midline, mucosa normal, no drainage    or sinus tenderness  Throat:   Lips, mucosa, and tongue normal; teeth and gums normal  Neck:   Supple, symmetrical, trachea midline, no adenopathy;    thyroid:  no enlargement/tenderness/nodules; no carotid   bruit or JVD  Back:     Symmetric, no curvature, ROM normal, no CVA tenderness  Lungs:     Clear to auscultation bilaterally, respirations unlabored  Chest Wall:    No tenderness or deformity   Heart:    Regular rate and rhythm, S1 and S2 normal, no murmur, rub   or gallop  Breast Exam:    No tenderness, masses, or nipple abnormality  Abdomen:     Soft, non-tender, bowel sounds active all four quadrants,    no masses, no organomegaly  Extremities:   Extremities normal, atraumatic, no cyanosis or edema  Pulses:   2+ and symmetric all extremities  Skin:   Skin color, texture, turgor normal, no rashes or lesions  Lymph nodes:   Cervical, supraclavicular, and axillary nodes normal  Neurologic:   CNII-XII intact, normal strength, sensation and reflexes    throughout    Labs: Lab Results  Component Value Date   SPECGRAV 1.020 05/01/2012   BILIRUBINUR NEG 05/01/2012  Assessment:    Healthy female exam.  Meets standards in 34 CFR 391.41;  qualifies for 2 year certificate.    Plan:    Medical examiners certificate completed and printed. Return as needed.

## 2012-05-01 NOTE — Patient Instructions (Addendum)
[name],  Thank you for coming in today. 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  Please return in one week to recheck Urinalysis.   Dr. Armen Pickup

## 2012-05-01 NOTE — Assessment & Plan Note (Addendum)
A: normal exam and eval except for trace blood on non-clean catch UA. No signs or symptoms of cystitis or UTI.  P: cleared for 2 yr license Form with copy of card placed in to be scanned box.  Repeat UA in one month (clean catch).

## 2012-05-31 ENCOUNTER — Ambulatory Visit (INDEPENDENT_AMBULATORY_CARE_PROVIDER_SITE_OTHER): Payer: Self-pay | Admitting: Family Medicine

## 2012-05-31 ENCOUNTER — Encounter: Payer: Self-pay | Admitting: Family Medicine

## 2012-05-31 VITALS — BP 123/78 | HR 89 | Temp 98.0°F | Ht 62.0 in | Wt 193.0 lb

## 2012-05-31 DIAGNOSIS — Z0289 Encounter for other administrative examinations: Secondary | ICD-10-CM

## 2012-05-31 DIAGNOSIS — Z024 Encounter for examination for driving license: Secondary | ICD-10-CM

## 2012-05-31 LAB — POCT URINALYSIS DIPSTICK
Protein, UA: NEGATIVE
Urobilinogen, UA: 0.2

## 2012-05-31 LAB — POCT UA - MICROSCOPIC ONLY

## 2012-05-31 NOTE — Assessment & Plan Note (Signed)
rechecked UA. Blood resolved.

## 2012-05-31 NOTE — Patient Instructions (Addendum)
[name],  Thank you for coming in today.  There is no blood in your urine today.   Please continue to drink plenty of water as your urine was a bit concentrated and had some bacteria. As long as your are asymptomatic we do not need to treat the bacteria.  Dr. Armen Pickup

## 2012-05-31 NOTE — Progress Notes (Signed)
Subjective:     Patient ID: Catalina Antigua, female   DOB: Jul 09, 1945, 67 y.o.   MRN: [id number]  HPI 67 yo F presents for f/u at my request to eval microscopic hematuria on last UA. She denies change in urine, dysuria, frequency or urgency.  Review of Systems As per HPI    Objective:   Physical Exam BP 123/78  Pulse 89  Temp 98 F (36.7 C) (Oral)  Ht 5\' 2"  (1.575 m)  Wt 193 lb (87.544 kg)  BMI 35.30 kg/m2 General appearance: alert, cooperative and no distress  Urinalysis Negative blood       Assessment and Plan:

## 2012-07-17 ENCOUNTER — Telehealth: Payer: Self-pay | Admitting: Hematology & Oncology

## 2012-07-17 NOTE — Telephone Encounter (Signed)
Left pt message moved 3-14 to 3-7

## 2012-07-25 ENCOUNTER — Other Ambulatory Visit: Payer: Self-pay | Admitting: Medical

## 2012-07-25 ENCOUNTER — Telehealth: Payer: Self-pay | Admitting: Hematology & Oncology

## 2012-07-25 DIAGNOSIS — C9000 Multiple myeloma not having achieved remission: Secondary | ICD-10-CM

## 2012-07-25 NOTE — Telephone Encounter (Signed)
Patient called and cx 07/26/12 due to bad weather.  She resch for 08/29/12

## 2012-07-26 ENCOUNTER — Ambulatory Visit: Payer: Self-pay | Admitting: Medical

## 2012-07-26 ENCOUNTER — Other Ambulatory Visit: Payer: Self-pay | Admitting: Lab

## 2012-08-02 ENCOUNTER — Other Ambulatory Visit: Payer: Self-pay | Admitting: Lab

## 2012-08-02 ENCOUNTER — Ambulatory Visit: Payer: Self-pay | Admitting: Hematology & Oncology

## 2012-08-02 ENCOUNTER — Ambulatory Visit: Payer: Self-pay | Admitting: Internal Medicine

## 2012-08-22 ENCOUNTER — Other Ambulatory Visit: Payer: Self-pay | Admitting: *Deleted

## 2012-08-22 DIAGNOSIS — K219 Gastro-esophageal reflux disease without esophagitis: Secondary | ICD-10-CM

## 2012-08-22 MED ORDER — PANTOPRAZOLE SODIUM 40 MG PO TBEC: 40.0000 mg | DELAYED_RELEASE_TABLET | Freq: Every day | ORAL | Status: DC

## 2012-08-29 ENCOUNTER — Other Ambulatory Visit (HOSPITAL_BASED_OUTPATIENT_CLINIC_OR_DEPARTMENT_OTHER): Payer: Medicare Other | Admitting: Lab

## 2012-08-29 ENCOUNTER — Ambulatory Visit (HOSPITAL_BASED_OUTPATIENT_CLINIC_OR_DEPARTMENT_OTHER): Payer: Medicare Other | Admitting: Medical

## 2012-08-29 ENCOUNTER — Ambulatory Visit (HOSPITAL_BASED_OUTPATIENT_CLINIC_OR_DEPARTMENT_OTHER): Payer: Medicare Other

## 2012-08-29 VITALS — BP 138/64 | HR 89 | Temp 97.9°F | Resp 16 | Ht 62.0 in | Wt 196.0 lb

## 2012-08-29 DIAGNOSIS — C9000 Multiple myeloma not having achieved remission: Secondary | ICD-10-CM

## 2012-08-29 LAB — CBC WITH DIFFERENTIAL (CANCER CENTER ONLY)
BASO%: 0.4 % (ref 0.0–2.0)
Eosinophils Absolute: 0.1 10*3/uL (ref 0.0–0.5)
LYMPH#: 1.5 10*3/uL (ref 0.9–3.3)
MONO#: 0.6 10*3/uL (ref 0.1–0.9)
NEUT#: 3.2 10*3/uL (ref 1.5–6.5)
Platelets: 295 10*3/uL (ref 145–400)
RBC: 4.06 10*6/uL (ref 3.70–5.32)
RDW: 13.4 % (ref 11.1–15.7)
WBC: 5.4 10*3/uL (ref 3.9–10.0)

## 2012-08-29 MED ORDER — SODIUM CHLORIDE 0.9 % IV SOLN
Freq: Once | INTRAVENOUS | Status: AC
  Administered 2012-08-29: 11:00:00 via INTRAVENOUS

## 2012-08-29 MED ORDER — SODIUM CHLORIDE 0.9 % IJ SOLN
3.0000 mL | Freq: Once | INTRAMUSCULAR | Status: AC | PRN
  Filled 2012-08-29: qty 10

## 2012-08-29 MED ORDER — ZOLEDRONIC ACID 4 MG/100ML IV SOLN
4.0000 mg | Freq: Once | INTRAVENOUS | Status: AC
  Administered 2012-08-29: 4 mg via INTRAVENOUS
  Filled 2012-08-29: qty 100

## 2012-08-29 NOTE — Patient Instructions (Addendum)
Zoledronic Acid injection (Hypercalcemia, Oncology) What is this medicine? ZOLEDRONIC ACID (ZOE le dron ik AS id) lowers the amount of calcium loss from bone. It is used to treat too much calcium in your blood from cancer. It is also used to prevent complications of cancer that has spread to the bone. This medicine may be used for other purposes; ask your health care provider or pharmacist if you have questions. What should I tell my health care provider before I take this medicine? They need to know if you have any of these conditions: -aspirin-sensitive asthma -dental disease -kidney disease -an unusual or allergic reaction to zoledronic acid, other medicines, foods, dyes, or preservatives -pregnant or trying to get pregnant -breast-feeding How should I use this medicine? This medicine is for infusion into a vein. It is given by a health care professional in a hospital or clinic setting. Talk to your pediatrician regarding the use of this medicine in children. Special care may be needed. Overdosage: If you think you have taken too much of this medicine contact a poison control center or emergency room at once. NOTE: This medicine is only for you. Do not share this medicine with others. What if I miss a dose? It is important not to miss your dose. Call your doctor or health care professional if you are unable to keep an appointment. What may interact with this medicine? -certain antibiotics given by injection -NSAIDs, medicines for pain and inflammation, like ibuprofen or naproxen -some diuretics like bumetanide, furosemide -teriparatide -thalidomide This list may not describe all possible interactions. Give your health care provider a list of all the medicines, herbs, non-prescription drugs, or dietary supplements you use. Also tell them if you smoke, drink alcohol, or use illegal drugs. Some items may interact with your medicine. What should I watch for while using this medicine? Visit  your doctor or health care professional for regular checkups. It may be some time before you see the benefit from this medicine. Do not stop taking your medicine unless your doctor tells you to. Your doctor may order blood tests or other tests to see how you are doing. Women should inform their doctor if they wish to become pregnant or think they might be pregnant. There is a potential for serious side effects to an unborn child. Talk to your health care professional or pharmacist for more information. You should make sure that you get enough calcium and vitamin D while you are taking this medicine. Discuss the foods you eat and the vitamins you take with your health care professional. Some people who take this medicine have severe bone, joint, and/or muscle pain. This medicine may also increase your risk for a broken thigh bone. Tell your doctor right away if you have pain in your upper leg or groin. Tell your doctor if you have any pain that does not go away or that gets worse. What side effects may I notice from receiving this medicine? Side effects that you should report to your doctor or health care professional as soon as possible: -allergic reactions like skin rash, itching or hives, swelling of the face, lips, or tongue -anxiety, confusion, or depression -breathing problems -changes in vision -feeling faint or lightheaded, falls -jaw burning, cramping, pain -muscle cramps, stiffness, or weakness -trouble passing urine or change in the amount of urine Side effects that usually do not require medical attention (report to your doctor or health care professional if they continue or are bothersome): -bone, joint, or muscle pain -  fever -hair loss -irritation at site where injected -loss of appetite -nausea, vomiting -stomach upset -tired This list may not describe all possible side effects. Call your doctor for medical advice about side effects. You may report side effects to FDA at  1-800-FDA-1088. Where should I keep my medicine? This drug is given in a hospital or clinic and will not be stored at home. NOTE: This sheet is a summary. It may not cover all possible information. If you have questions about this medicine, talk to your doctor, pharmacist, or health care provider.  2012, Elsevier/Gold Standard. (11/04/2010 9:06:58 AM) 

## 2012-08-29 NOTE — Progress Notes (Signed)
DIAGNOSES: 1. IgG kappa myeloma, clinical remission post bone marrow transplant. 2. Post marrow transplant polyarthropathy.  CURRENT THERAPY:  Zometa 4 mg IV q.4 months.  INTERIM HISTORY:  [name], presents today for an office followup visit.  Overall, she, reports, that she's doing quite well.  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  She really enjoys, which she's doing.  She does have some polyarthropathy, secondary to high-dose chemotherapy and stem cell transplant.  She seems to be doing well with this.  She, reports, no pain.  Her myeloma studies back in November revealed an IgG of 1,6500 mg/dL, kappa free light chain of 1.9, 2 mg/dL, and an M spike that was not detected.  She, reports, Appetite.  She denies any nausea, vomiting, diarrhea, constipation, chest pain, shortness of breath, or cough.  She denies any fevers, chills, or night sweats.  She denies any abdominal pain, or back pain.  She denies any bony-type pain.  She denies any obvious, nerve normal, bleeding.  She denies any lower leg swelling.  She denies any headaches, visual changes, or rashes.  Review of Systems: Constitutional:Negative for malaise/fatigue, fever, chills, weight loss, diaphoresis, activity change, appetite change, and unexpected weight change.  HEENT: Negative for double vision, blurred vision, visual loss, ear pain, tinnitus, congestion, rhinorrhea, epistaxis sore throat or sinus disease, oral pain/lesion, tongue soreness Respiratory: Negative for cough, chest tightness, shortness of breath, wheezing and stridor.  Cardiovascular: Negative for chest pain, palpitations, leg swelling, orthopnea, PND, DOE or claudication Gastrointestinal: Negative for nausea, vomiting, abdominal pain, diarrhea, constipation, blood in stool, melena, hematochezia, abdominal distention, anal bleeding, rectal pain, anorexia and hematemesis.  Genitourinary: Negative for dysuria, frequency, hematuria,  Musculoskeletal: Negative  for myalgias, back pain, joint swelling, arthralgias and gait problem.  Skin: Negative for rash, color change, pallor and wound.  Neurological:. Negative for dizziness/light-headedness, tremors, seizures, syncope, facial asymmetry, speech difficulty, weakness, numbness, headaches and paresthesias.  Hematological: Negative for adenopathy. Does not bruise/bleed easily.  Psychiatric/Behavioral:  Negative for depression, no loss of interest in normal activity or change in sleep pattern.   Physical Exam: This is a 67 year old, well-nourished, African American, female, in no obvious distress Vitals: Temperature 97.9 degrees, pulse 89, respirations 16, blood pressure 138/64.  Weight 196 pounds HEENT reveals a normocephalic, atraumatic skull, no scleral icterus, no oral lesions  Neck is supple without any cervical or supraclavicular adenopathy.  Lungs are clear to auscultation bilaterally. There are no wheezes, rales or rhonci Cardiac is regular rate and rhythm with a normal S1 and S2. There are no murmurs, rubs, or bruits.  Abdomen is soft with good bowel sounds, there is no palpable mass. There is no palpable hepatosplenomegaly. There is no palpable fluid wave.  Musculoskeletal no tenderness of the spine, ribs, or hips.  Extremities there are no clubbing, cyanosis, or edema.  Skin no petechia, purpura or ecchymosis Neurologic is nonfocal.  Laboratory Data: White count 5.4, hemoglobin 12.7, hematocrit 30.1, platelets 295,000  Current Outpatient Prescriptions on File Prior to Visit  Medication Sig Dispense Refill  . buPROPion (WELLBUTRIN SR) 100 MG 12 hr tablet Take 1 tablet (100 mg total) by mouth 2 (two) times daily. Once in the morning and again 8 hours later  60 tablet  1  . colesevelam (WELCHOL) 625 MG tablet Take three tablets twice a day      . diltiazem (CARDIZEM CD) 240 MG 24 hr capsule Take 240 mg by mouth daily.      . Iron-Vitamin C (VITRON-C)  65-125 MG TABS Take 1 tablet by mouth  daily.      . pantoprazole (PROTONIX) 40 MG tablet Take 1 tablet (40 mg total) by mouth daily.  90 tablet  3   No current facility-administered medications on file prior to visit.   .Assessment/Plan: This is a pleasant, 67 year old, African American, female, with the following issues:  #1.  IgG kappa myeloma.  She has been in remission since transplant.  She had her transplant in October 2006.  Overall, she is doing remarkably well.  Today, she has had no evidence of myeloma and her blood.  #2.  Supportive therapy.  She remains on Zometa every 4 months.  #3.  Followup.  She will follow back up with [place] in 4 months, but before then should there be questions or concerns.

## 2012-08-29 NOTE — Addendum Note (Signed)
Addended by: Anselm Jungling on: 08/29/2012 11:56 AM   Modules accepted: Orders

## 2012-09-02 ENCOUNTER — Telehealth: Payer: Self-pay

## 2012-09-02 LAB — PROTEIN ELECTROPHORESIS, SERUM, WITH REFLEX
Albumin ELP: 55.1 % — ABNORMAL LOW (ref 55.8–66.1)
Beta 2: 6.7 % — ABNORMAL HIGH (ref 3.2–6.5)
Beta Globulin: 6.5 % (ref 4.7–7.2)
Gamma Globulin: 17.4 % (ref 11.1–18.8)

## 2012-09-02 LAB — KAPPA/LAMBDA LIGHT CHAINS
Kappa free light chain: 2.38 mg/dL — ABNORMAL HIGH (ref 0.33–1.94)
Kappa:Lambda Ratio: 1.87 — ABNORMAL HIGH (ref 0.26–1.65)
Lambda Free Lght Chn: 1.27 mg/dL (ref 0.57–2.63)

## 2012-09-02 LAB — IGG, IGA, IGM: IgG (Immunoglobin G), Serum: 1470 mg/dL (ref 690–1700)

## 2012-09-02 NOTE — Telephone Encounter (Addendum)
Message copied by Leana Gamer on Mon Sep 02, 2012 10:44 AM ------      Message from: Josph Macho      Created: Sun Sep 01, 2012  7:56 PM       Call - myeloma still not a problem!!  Cindee Lame ------patient given result of her myeloma still not a problem. Pt happy with result.

## 2012-09-17 ENCOUNTER — Other Ambulatory Visit: Payer: Self-pay | Admitting: *Deleted

## 2012-09-17 NOTE — Telephone Encounter (Signed)
error 

## 2012-10-23 ENCOUNTER — Other Ambulatory Visit: Payer: Self-pay | Admitting: *Deleted

## 2012-10-23 DIAGNOSIS — K219 Gastro-esophageal reflux disease without esophagitis: Secondary | ICD-10-CM

## 2012-10-23 MED ORDER — PANTOPRAZOLE SODIUM 40 MG PO TBEC: 40.0000 mg | DELAYED_RELEASE_TABLET | Freq: Every day | ORAL | Status: DC

## 2012-10-23 NOTE — Telephone Encounter (Signed)
Received a call from [facility] a division of [facility]. They are requesting Protonix refill. This was refilled on 08/22/12 #90 x 3 RF and faxed but they said they never received it. Will reissue the rx and fax it to [phone].

## 2012-10-27 ENCOUNTER — Encounter (HOSPITAL_COMMUNITY): Payer: Self-pay | Admitting: *Deleted

## 2012-10-27 ENCOUNTER — Emergency Department (HOSPITAL_COMMUNITY): Payer: Medicare Other

## 2012-10-27 ENCOUNTER — Emergency Department (HOSPITAL_COMMUNITY)
Admission: EM | Admit: 2012-10-27 | Discharge: 2012-10-27 | Disposition: A | Discharge status: Home / Self Care | Payer: Medicare Other | Attending: Emergency Medicine | Admitting: Emergency Medicine

## 2012-10-27 DIAGNOSIS — Y929 Unspecified place or not applicable: Secondary | ICD-10-CM | POA: Insufficient documentation

## 2012-10-27 DIAGNOSIS — S43499A Other sprain of unspecified shoulder joint, initial encounter: Secondary | ICD-10-CM | POA: Diagnosis not present

## 2012-10-27 DIAGNOSIS — Y9389 Activity, other specified: Secondary | ICD-10-CM | POA: Insufficient documentation

## 2012-10-27 DIAGNOSIS — Z8619 Personal history of other infectious and parasitic diseases: Secondary | ICD-10-CM | POA: Insufficient documentation

## 2012-10-27 DIAGNOSIS — N289 Disorder of kidney and ureter, unspecified: Secondary | ICD-10-CM | POA: Insufficient documentation

## 2012-10-27 DIAGNOSIS — S46819A Strain of other muscles, fascia and tendons at shoulder and upper arm level, unspecified arm, initial encounter: Secondary | ICD-10-CM | POA: Diagnosis not present

## 2012-10-27 DIAGNOSIS — K219 Gastro-esophageal reflux disease without esophagitis: Secondary | ICD-10-CM | POA: Insufficient documentation

## 2012-10-27 DIAGNOSIS — E559 Vitamin D deficiency, unspecified: Secondary | ICD-10-CM | POA: Diagnosis not present

## 2012-10-27 DIAGNOSIS — Z8589 Personal history of malignant neoplasm of other organs and systems: Secondary | ICD-10-CM | POA: Insufficient documentation

## 2012-10-27 DIAGNOSIS — IMO0002 Reserved for concepts with insufficient information to code with codable children: Secondary | ICD-10-CM | POA: Insufficient documentation

## 2012-10-27 DIAGNOSIS — Z85830 Personal history of malignant neoplasm of bone: Secondary | ICD-10-CM | POA: Diagnosis not present

## 2012-10-27 DIAGNOSIS — M25569 Pain in unspecified knee: Secondary | ICD-10-CM | POA: Insufficient documentation

## 2012-10-27 DIAGNOSIS — Z79899 Other long term (current) drug therapy: Secondary | ICD-10-CM | POA: Diagnosis not present

## 2012-10-27 DIAGNOSIS — Z862 Personal history of diseases of the blood and blood-forming organs and certain disorders involving the immune mechanism: Secondary | ICD-10-CM | POA: Diagnosis not present

## 2012-10-27 DIAGNOSIS — R072 Precordial pain: Secondary | ICD-10-CM | POA: Diagnosis not present

## 2012-10-27 DIAGNOSIS — G8929 Other chronic pain: Secondary | ICD-10-CM | POA: Insufficient documentation

## 2012-10-27 DIAGNOSIS — R079 Chest pain, unspecified: Secondary | ICD-10-CM | POA: Insufficient documentation

## 2012-10-27 DIAGNOSIS — Z8639 Personal history of other endocrine, nutritional and metabolic disease: Secondary | ICD-10-CM | POA: Insufficient documentation

## 2012-10-27 DIAGNOSIS — I10 Essential (primary) hypertension: Secondary | ICD-10-CM | POA: Diagnosis not present

## 2012-10-27 DIAGNOSIS — D649 Anemia, unspecified: Secondary | ICD-10-CM | POA: Diagnosis not present

## 2012-10-27 DIAGNOSIS — Z8739 Personal history of other diseases of the musculoskeletal system and connective tissue: Secondary | ICD-10-CM | POA: Insufficient documentation

## 2012-10-27 DIAGNOSIS — Y990 Civilian activity done for income or pay: Secondary | ICD-10-CM | POA: Insufficient documentation

## 2012-10-27 DIAGNOSIS — M2550 Pain in unspecified joint: Secondary | ICD-10-CM | POA: Insufficient documentation

## 2012-10-27 DIAGNOSIS — S46912A Strain of unspecified muscle, fascia and tendon at shoulder and upper arm level, left arm, initial encounter: Secondary | ICD-10-CM

## 2012-10-27 DIAGNOSIS — X58XXXA Exposure to other specified factors, initial encounter: Secondary | ICD-10-CM | POA: Insufficient documentation

## 2012-10-27 LAB — TROPONIN I: Troponin I: <0.3 ng/mL (ref <0.30)

## 2012-10-27 LAB — CBC WITH DIFFERENTIAL/PLATELET
Basophils Absolute: 0 10*3/uL (ref 0.0–0.1)
Basophils Relative: 0 % (ref 0–1)
Eosinophils Relative: 1 % (ref 0–5)
HCT: 36.4 % (ref 36.0–46.0)
Lymphocytes Relative: 14 % (ref 12–46)
MCHC: 34.1 g/dL (ref 30.0–36.0)
Monocytes Absolute: 0.5 10*3/uL (ref 0.1–1.0)
Neutro Abs: 5.1 10*3/uL (ref 1.7–7.7)
Platelets: 310 10*3/uL (ref 150–400)
RDW: 13.5 % (ref 11.5–15.5)
WBC: 6.7 10*3/uL (ref 4.0–10.5)

## 2012-10-27 LAB — POCT I-STAT, CHEM 8
Calcium, Ion: 1.23 mmol/L (ref 1.13–1.30)
Chloride: 112 mEq/L (ref 96–112)
Glucose, Bld: 166 mg/dL — ABNORMAL HIGH (ref 70–99)
HCT: 39 % (ref 36.0–46.0)
Hemoglobin: 13.3 g/dL (ref 12.0–15.0)
Potassium: 4 mEq/L (ref 3.5–5.1)

## 2012-10-27 MED ORDER — DILTIAZEM HCL ER COATED BEADS 240 MG PO CP24: 240.0000 mg | ORAL_CAPSULE | Freq: Every day | ORAL | Status: DC

## 2012-10-27 MED ORDER — CYCLOBENZAPRINE HCL 10 MG PO TABS
5.0000 mg | ORAL_TABLET | Freq: Once | ORAL | Status: AC
  Administered 2012-10-27: 5 mg via ORAL
  Filled 2012-10-27: qty 1

## 2012-10-27 MED ORDER — IBUPROFEN 200 MG PO TABS
600.0000 mg | ORAL_TABLET | Freq: Once | ORAL | Status: AC
  Administered 2012-10-27: 600 mg via ORAL
  Filled 2012-10-27: qty 3

## 2012-10-27 MED ORDER — DILTIAZEM HCL ER COATED BEADS 240 MG PO CP24
240.0000 mg | ORAL_CAPSULE | Freq: Every day | ORAL | Status: DC
  Administered 2012-10-27: 240 mg via ORAL
  Filled 2012-10-27 (×2): qty 1

## 2012-10-27 MED ORDER — CYCLOBENZAPRINE HCL 5 MG PO TABS: 5.0000 mg | ORAL_TABLET | Freq: Two times a day (BID) | ORAL | Status: DC | PRN

## 2012-10-27 NOTE — ED Provider Notes (Signed)
Medical screening examination/treatment/procedure(s) were performed by non-physician practitioner and as supervising physician I was immediately available for consultation/collaboration.  Tamaria Dunleavy M Saketh Daubert, MD 10/27/12 0501 

## 2012-10-27 NOTE — ED Provider Notes (Signed)
History     CSN: [id number]  Arrival date & time 10/27/12  2130   First MD Initiated Contact with Patient 10/27/12 [phone]      Chief Complaint  Patient presents with  . Chest Pain    (Consider location/radiation/quality/duration/timing/severity/associated sxs/prior treatment) HPI Comments: Patient states, that for the last 2, weeks.  She's had left anterior chest pain.  That is progressively gotten worse.  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  The pain was excruciating, worse when she tries to move her left arm.  She denies any shortness of breath, chest pain, diaphoresis, nausea.  She does have a history of multiple myeloma.  That has been in remission for several years.  She has taken over-the-counter nonsteroidals with little relief of her pain.  She has not seen her primary care physician for this discomfort  Chest Pain Pain location:  L chest Pain quality: aching   Pain radiates to:  Upper back Pain radiates to the back: yes   Pain severity:  Mild Onset quality:  Gradual Duration:  2 weeks Timing:  Constant Progression:  Worsening Chronicity:  New Context: movement and raising an arm   Context: not breathing, no drug use, not eating, no intercourse, not lifting, not at rest, no stress and no trauma   Relieved by:  Rest Worsened by:  Exertion and certain positions Associated symptoms: no back pain, no cough, no dizziness, no dysphagia, no fatigue, no fever, no headache, no nausea, no shortness of breath, not vomiting and no weakness   Risk factors: obesity   Patient is a 67 y.o. female presenting with chest pain. The history is provided by the patient.    Past Medical History  Diagnosis Date  . Degenerative joint disease involving multiple joints 03/29/2011  . 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 Anemia   . GERD (gastroesophageal reflux disease)   . Vitamin D deficiency   . Hyperlipidemia   .  Paget's bone disease   . History of bone cancer     chemo XRT, Dr. Myna Hidalgo  . DJD (degenerative joint disease)   . OA (osteoarthritis)   . Renal insufficiency     H/O resolved  . Multiple myeloma     IgG Kappa  . Herpes simplex esophagitis 2006  . HTN (hypertension) 2005  . Chronic pain of left knee     Past Surgical History  Procedure Laterality Date  . Bone marrow transplant  01/2005    [facility]   . Knee arthroscopy  05/2008    left  . Total knee arthroplasty  10/11/2010    left  . Colonoscopy    . Upper gastrointestinal endoscopy      Family History  Problem Relation Age of Onset  . Colon cancer Neg Hx   . Lung cancer Father     smoker  . Throat cancer Brother   . Heart attack Mother   . Diabetes Mother     History  Substance Use Topics  . Smoking status: Never Smoker   . Smokeless tobacco: Never Used  . Alcohol Use: No    OB History   Grav Para Term Preterm Abortions TAB SAB Ect Mult Living                  Review of Systems  Constitutional: Negative for fever, chills and fatigue.  HENT: Negative for sore throat and trouble swallowing.  Respiratory: Negative for cough and shortness of breath.   Cardiovascular: Positive for chest pain.  Gastrointestinal: Negative for nausea and vomiting.  Musculoskeletal: Negative for back pain and joint swelling.  Skin: Negative for rash.  Neurological: Negative for dizziness, weakness and headaches.  All other systems reviewed and are negative.    Allergies  Review of patient's allergies indicates no known allergies.  Home Medications   Current Outpatient Rx  Name  Route  Sig  Dispense  Refill  . colesevelam (WELCHOL) 625 MG tablet   Oral   Take 1,875 mg by mouth 2 (two) times daily with a meal. Take three tablets twice a day         . diltiazem (CARDIZEM CD) 240 MG 24 hr capsule   Oral   Take 240 mg by mouth daily.         Marland Kitchen ibuprofen (ADVIL,MOTRIN) 200 MG tablet   Oral   Take 400 mg by mouth every 6  (six) hours as needed for pain.         . Iron-Vitamin C (VITRON-C) 65-125 MG TABS   Oral   Take 1 tablet by mouth every morning.          . pantoprazole (PROTONIX) 40 MG tablet   Oral   Take 40 mg by mouth every morning.         . cyclobenzaprine (FLEXERIL) 5 MG tablet   Oral   Take 1 tablet (5 mg total) by mouth 2 (two) times daily as needed for muscle spasms.   30 tablet   0   . diltiazem (CARDIZEM CD) 240 MG 24 hr capsule   Oral   Take 1 capsule (240 mg total) by mouth daily.   30 capsule   0     BP 174/90  Pulse 97  Temp(Src) 98.3 F (36.8 C) (Oral)  Resp 18  Ht 5\' 2"  (1.575 m)  Wt 180 lb (81.647 kg)  BMI 32.91 kg/m2  SpO2 97%  Physical Exam  Nursing note and vitals reviewed. Constitutional: She appears well-developed and well-nourished.  HENT:  Head: Normocephalic.  Neck: Normal range of motion.  Cardiovascular: Normal rate and regular rhythm.   Pulmonary/Chest: Effort normal and breath sounds normal. No respiratory distress. She exhibits no tenderness.  Abdominal: Soft. Bowel sounds are normal.  Musculoskeletal: She exhibits tenderness. She exhibits no edema.       Left shoulder: She exhibits decreased range of motion, tenderness and pain. She exhibits no bony tenderness, no swelling, no effusion, no crepitus, no deformity, no laceration, no spasm, normal pulse and normal strength.       Arms:   ED Course  Procedures (including critical care time)  Labs Reviewed  POCT I-STAT, CHEM 8 - Abnormal; Notable for the following:    Creatinine, Ser 1.60 (*)    Glucose, Bld 166 (*)    All other components within normal limits  CBC WITH DIFFERENTIAL  TROPONIN I   Dg Chest 2 View  10/27/2012   *RADIOLOGY REPORT*  Clinical Data: Mid to left-sided chest pain and left arm pain.  CHEST - 2 VIEW  Comparison: Chest radiograph performed 10/04/2010  Findings: The lungs are well-aerated and clear.  There is no evidence of focal opacification, pleural effusion or  pneumothorax.  The heart is normal in size; the mediastinal contour is within normal limits.  No acute osseous abnormalities are seen.  IMPRESSION: No acute cardiopulmonary process seen.   Original Report Authenticated By: Tonia Ghent, M.D.  1. Shoulder strain, left, initial encounter   2. Hypertension      Date: 10/27/2012  Rate: 100  Rhythm: sinus tachycardia  QRS Axis: left atrail abnormality  Intervals: normal  ST/T Wave abnormalities: normal  Conduction Disutrbances:none  Narrative Interpretation:   Old EKG Reviewed: unchanged    MDM  This patient has no known cardiac disease, but is at significant risk to to her history of renal insufficiency, multiple myeloma, hypertension, hyperlipidemia, history of Paget's disease Labs EKG, has been reviewed.  Still feel this is muscular in nature.  She'll be given, prescription for Flexeril, sure to take ibuprofen.  I suggest, 600 mg 3 times a day.  I will give her prescription for her Cardizem since she is out and she will follow up with her primary care physician on Monday or Tuesday I feel that this is a shoulder strain         Arman Filter, NP 10/27/12 0865  Arman Filter, NP 10/27/12 7846  Arman Filter, NP 10/27/12 9629

## 2012-10-27 NOTE — ED Notes (Signed)
Pt states she has left sided chest pain with nausea and unable to lift left arm,  10/10 chest pain started today and has continuously gotten worse,

## 2012-10-27 NOTE — ED Provider Notes (Deleted)
History     CSN: [id number]  Arrival date & time 10/27/12  0865   First MD Initiated Contact with Patient 10/27/12 [phone]      Chief Complaint  Patient presents with  . Chest Pain    (Consider location/radiation/quality/duration/timing/severity/associated sxs/prior treatment) HPI Comments: Patient states, that for the last 2, weeks.  She's had left anterior chest pain.  That is progressively gotten worse.  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  The pain was excruciating, worse when she tries to move her left arm.  She denies any shortness of breath, chest pain, diaphoresis, nausea.  She does have a history of multiple myeloma.  That has been in remission for several years.  She has taken over-the-counter nonsteroidals with little relief of her pain.  She has not seen her primary care physician for this discomfort  Patient is a 67 y.o. female presenting with chest pain. The history is provided by the patient.  Chest Pain Pain location:  L chest Pain quality: aching   Pain radiates to:  Upper back Pain radiates to the back: yes   Pain severity:  Mild Onset quality:  Gradual Duration:  2 weeks Timing:  Constant Progression:  Worsening Chronicity:  New Context: movement and raising an arm   Context: not breathing, no drug use, not eating, no intercourse, not lifting, not at rest, no stress and no trauma   Relieved by:  Rest Worsened by:  Exertion and certain positions Associated symptoms: no back pain, no cough, no dizziness, no dysphagia, no fatigue, no fever, no headache, no nausea, no shortness of breath, not vomiting and no weakness   Risk factors: obesity     Past Medical History  Diagnosis Date  . Degenerative joint disease involving multiple joints 03/29/2011  . 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 Anemia   . GERD (gastroesophageal reflux disease)   . Vitamin D deficiency   . Hyperlipidemia   .  Paget's bone disease   . History of bone cancer     chemo XRT, Dr. Myna Hidalgo  . DJD (degenerative joint disease)   . OA (osteoarthritis)   . Renal insufficiency     H/O resolved  . Multiple myeloma     IgG Kappa  . Herpes simplex esophagitis 2006  . HTN (hypertension) 2005  . Chronic pain of left knee     Past Surgical History  Procedure Laterality Date  . Bone marrow transplant  01/2005    [facility]   . Knee arthroscopy  05/2008    left  . Total knee arthroplasty  10/11/2010    left  . Colonoscopy    . Upper gastrointestinal endoscopy      Family History  Problem Relation Age of Onset  . Colon cancer Neg Hx   . Lung cancer Father     smoker  . Throat cancer Brother   . Heart attack Mother   . Diabetes Mother     History  Substance Use Topics  . Smoking status: Never Smoker   . Smokeless tobacco: Never Used  . Alcohol Use: No    OB History   Grav Para Term Preterm Abortions TAB SAB Ect Mult Living                  Review of Systems  Constitutional: Negative for fever, chills and fatigue.  HENT: Negative for sore throat and trouble swallowing.  Respiratory: Negative for cough and shortness of breath.   Cardiovascular: Positive for chest pain.  Gastrointestinal: Negative for nausea and vomiting.  Musculoskeletal: Negative for back pain and joint swelling.  Skin: Negative for rash.  Neurological: Negative for dizziness, weakness and headaches.  All other systems reviewed and are negative.    Allergies  Review of patient's allergies indicates no known allergies.  Home Medications   Current Outpatient Rx  Name  Route  Sig  Dispense  Refill  . colesevelam (WELCHOL) 625 MG tablet   Oral   Take 1,875 mg by mouth 2 (two) times daily with a meal. Take three tablets twice a day         . diltiazem (CARDIZEM CD) 240 MG 24 hr capsule   Oral   Take 240 mg by mouth daily.         Marland Kitchen ibuprofen (ADVIL,MOTRIN) 200 MG tablet   Oral   Take 400 mg by mouth every 6  (six) hours as needed for pain.         . Iron-Vitamin C (VITRON-C) 65-125 MG TABS   Oral   Take 1 tablet by mouth every morning.          . pantoprazole (PROTONIX) 40 MG tablet   Oral   Take 40 mg by mouth every morning.         . cyclobenzaprine (FLEXERIL) 5 MG tablet   Oral   Take 1 tablet (5 mg total) by mouth 2 (two) times daily as needed for muscle spasms.   30 tablet   0   . diltiazem (CARDIZEM CD) 240 MG 24 hr capsule   Oral   Take 1 capsule (240 mg total) by mouth daily.   30 capsule   0     BP 174/90  Pulse 97  Temp(Src) 98.3 F (36.8 C) (Oral)  Resp 18  Ht 5\' 2"  (1.575 m)  Wt 180 lb (81.647 kg)  BMI 32.91 kg/m2  SpO2 97%  Physical Exam  Nursing note and vitals reviewed. Constitutional: She appears well-developed and well-nourished.  HENT:  Head: Normocephalic.  Neck: Normal range of motion.  Cardiovascular: Normal rate and regular rhythm.   Pulmonary/Chest: Effort normal and breath sounds normal. No respiratory distress. She exhibits no tenderness.  Abdominal: Soft. Bowel sounds are normal.  Musculoskeletal: She exhibits tenderness. She exhibits no edema.       Left shoulder: She exhibits decreased range of motion, tenderness and pain. She exhibits no bony tenderness, no swelling, no effusion, no crepitus, no deformity, no laceration, no spasm, normal pulse and normal strength.       Arms:   ED Course  Procedures (including critical care time)  Labs Reviewed  POCT I-STAT, CHEM 8 - Abnormal; Notable for the following:    Creatinine, Ser 1.60 (*)    Glucose, Bld 166 (*)    All other components within normal limits  CBC WITH DIFFERENTIAL  TROPONIN I   Dg Chest 2 View  10/27/2012   *RADIOLOGY REPORT*  Clinical Data: Mid to left-sided chest pain and left arm pain.  CHEST - 2 VIEW  Comparison: Chest radiograph performed 10/04/2010  Findings: The lungs are well-aerated and clear.  There is no evidence of focal opacification, pleural effusion or  pneumothorax.  The heart is normal in size; the mediastinal contour is within normal limits.  No acute osseous abnormalities are seen.  IMPRESSION: No acute cardiopulmonary process seen.   Original Report Authenticated By: Tonia Ghent, M.D.  1. Shoulder strain, left, initial encounter   2. Hypertension      Date: 10/27/2012  Rate: 100  Rhythm: sinus tachycardia  QRS Axis: left atrail abnormality  Intervals: normal  ST/T Wave abnormalities: normal  Conduction Disutrbances:none  Narrative Interpretation:   Old EKG Reviewed: unchanged    MDM  This patient has no known cardiac disease, but is at significant risk to to her history of renal insufficiency, multiple myeloma, hypertension, hyperlipidemia, history of Paget's disease Labs EKG, has been reviewed.  Still feel this is muscular in nature.  She'll be given, prescription for Flexeril, sure to take ibuprofen.  I suggest, 600 mg 3 times a day.  I will give her prescription for her Cardizem since she is out and she will follow up with her primary care physician on Monday or Tuesday I feel that this is a shoulder strain         Arman Filter, NP 10/27/12 1610  Arman Filter, NP 10/27/12 [phone]

## 2012-11-19 DIAGNOSIS — I10 Essential (primary) hypertension: Secondary | ICD-10-CM | POA: Diagnosis not present

## 2012-11-19 DIAGNOSIS — E669 Obesity, unspecified: Secondary | ICD-10-CM | POA: Diagnosis not present

## 2012-11-19 DIAGNOSIS — K219 Gastro-esophageal reflux disease without esophagitis: Secondary | ICD-10-CM | POA: Diagnosis not present

## 2012-11-19 DIAGNOSIS — F4321 Adjustment disorder with depressed mood: Secondary | ICD-10-CM | POA: Diagnosis not present

## 2012-11-19 DIAGNOSIS — C9000 Multiple myeloma not having achieved remission: Secondary | ICD-10-CM | POA: Diagnosis not present

## 2012-11-19 DIAGNOSIS — E785 Hyperlipidemia, unspecified: Secondary | ICD-10-CM | POA: Diagnosis not present

## 2012-11-19 DIAGNOSIS — Z1331 Encounter for screening for depression: Secondary | ICD-10-CM | POA: Diagnosis not present

## 2012-11-19 DIAGNOSIS — G4700 Insomnia, unspecified: Secondary | ICD-10-CM | POA: Diagnosis not present

## 2012-11-19 DIAGNOSIS — R7309 Other abnormal glucose: Secondary | ICD-10-CM | POA: Diagnosis not present

## 2012-12-24 DIAGNOSIS — Z6834 Body mass index (BMI) 34.0-34.9, adult: Secondary | ICD-10-CM | POA: Diagnosis not present

## 2012-12-24 DIAGNOSIS — E669 Obesity, unspecified: Secondary | ICD-10-CM | POA: Diagnosis not present

## 2012-12-24 DIAGNOSIS — I10 Essential (primary) hypertension: Secondary | ICD-10-CM | POA: Diagnosis not present

## 2012-12-26 ENCOUNTER — Other Ambulatory Visit: Payer: Self-pay | Admitting: Pharmacist

## 2012-12-26 DIAGNOSIS — C9000 Multiple myeloma not having achieved remission: Secondary | ICD-10-CM

## 2013-01-02 ENCOUNTER — Ambulatory Visit (HOSPITAL_BASED_OUTPATIENT_CLINIC_OR_DEPARTMENT_OTHER): Payer: Medicare Other | Admitting: Lab

## 2013-01-02 ENCOUNTER — Ambulatory Visit (HOSPITAL_BASED_OUTPATIENT_CLINIC_OR_DEPARTMENT_OTHER): Payer: Medicare Other

## 2013-01-02 ENCOUNTER — Ambulatory Visit (HOSPITAL_BASED_OUTPATIENT_CLINIC_OR_DEPARTMENT_OTHER): Payer: Medicare Other | Admitting: Hematology & Oncology

## 2013-01-02 ENCOUNTER — Encounter: Payer: Self-pay | Admitting: Hematology & Oncology

## 2013-01-02 VITALS — BP 126/71 | HR 87 | Temp 98.1°F | Resp 16 | Wt 188.0 lb

## 2013-01-02 DIAGNOSIS — C9000 Multiple myeloma not having achieved remission: Secondary | ICD-10-CM

## 2013-01-02 DIAGNOSIS — M159 Polyosteoarthritis, unspecified: Secondary | ICD-10-CM

## 2013-01-02 DIAGNOSIS — C9001 Multiple myeloma in remission: Secondary | ICD-10-CM

## 2013-01-02 LAB — CBC WITH DIFFERENTIAL (CANCER CENTER ONLY)
BASO%: 0.4 % (ref 0.0–2.0)
EOS%: 1.5 % (ref 0.0–7.0)
LYMPH#: 1.5 10*3/uL (ref 0.9–3.3)
MCHC: 33.3 g/dL (ref 32.0–36.0)
MONO#: 0.5 10*3/uL (ref 0.1–0.9)
NEUT#: 2.5 10*3/uL (ref 1.5–6.5)
Platelets: 291 10*3/uL (ref 145–400)
RDW: 13.2 % (ref 11.1–15.7)
WBC: 4.6 10*3/uL (ref 3.9–10.0)

## 2013-01-02 LAB — CMP (CANCER CENTER ONLY)
Albumin: 4.1 g/dL (ref 3.3–5.5)
Alkaline Phosphatase: 92 U/L — ABNORMAL HIGH (ref 26–84)
BUN, Bld: 17 mg/dL (ref 7–22)
Creat: 1.3 mg/dl — ABNORMAL HIGH (ref 0.6–1.2)
Glucose, Bld: 97 mg/dL (ref 73–118)
Potassium: 4.3 mEq/L (ref 3.3–4.7)

## 2013-01-02 MED ORDER — ZOLEDRONIC ACID 4 MG/100ML IV SOLN
4.0000 mg | Freq: Once | INTRAVENOUS | Status: AC
  Administered 2013-01-02: 4 mg via INTRAVENOUS
  Filled 2013-01-02: qty 100

## 2013-01-02 MED ORDER — SODIUM CHLORIDE 0.9 % IV SOLN
Freq: Once | INTRAVENOUS | Status: AC
  Administered 2013-01-02: 12:00:00 via INTRAVENOUS

## 2013-01-02 NOTE — Progress Notes (Signed)
This office note has been dictated.

## 2013-01-03 NOTE — Progress Notes (Signed)
CC:   Erin Lis, MD  DIAGNOSES: 1. IgG kappa myeloma, clinical remission. 2. 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  CURRENT THERAPY:  Zometa 4 mg IV q.4 months.  INTERIM HISTORY:  [name] comes in for followup.  She is really doing well.  She is feeling well.  The arthropathy does not seem to be as bad right now.  Her myeloma continues to be in remission.  She had her stem cell transplant back in October 2006.  She goes back in October to [place] for a party and celebration.  Her last monoclonal studies that we did on her did not show a monoclonal spike.  She has had no fever.  She has had no palpable lymph glands.  There has been no nausea or vomiting.  There has been no change in bowel or bladder habits.  She is due for a mammogram, I think next month.  PHYSICAL EXAMINATION:  General:  This is a well-developed, well- nourished black female in no obvious distress.  Vital signs: Temperature of 98.1, pulse 87, respiratory rate 16, blood pressure 126/71.  Weight is 188.  Head and neck:  Normocephalic, atraumatic skull.  There are no ocular or oral lesions.  There are no palpable cervical or supraclavicular lymph nodes.  Lungs:  Clear bilaterally. Cardiac:  Regular rate and rhythm with a normal S1 and S2.  There are no murmurs, rubs or bruits.  Abdomen:  Soft.  She has good bowel sounds. There is no fluid wave.  There is no palpable hepatosplenomegaly. Extremities:  No clubbing, cyanosis or edema.  Neurological:  No focal neurological deficits.  LABORATORY STUDIES:  White cell count is 4.6, hemoglobin 13.3, hematocrit 40, platelet count is 290.  IMPRESSION:  [name] is a 67 year old African American female with IgG kappa myeloma.  She underwent a stem cell transplantation, ultimately, in October of 2006.  She has been in remission.  She remains in remission.  When I looked at her blood under the microscope, I did not see anything that looked  troublesome.  There is no rouleaux formation.  There are no immature lymphoid cells.  I did not see any plasma cells.  We will go ahead and plan to get her back in another 4 months.  I do not see any need for lab work or x-rays in between visits.    ______________________________ Josph Macho, M.D. PRE/MEDQ  D:  01/02/2013  T:  01/03/2013  Job:  [id number]

## 2013-01-06 LAB — PROTEIN ELECTROPHORESIS, SERUM, WITH REFLEX
Albumin ELP: 49.9 % — ABNORMAL LOW (ref 55.8–66.1)
Alpha-1-Globulin: 9.2 % — ABNORMAL HIGH (ref 2.9–4.9)
Alpha-2-Globulin: 10.6 % (ref 7.1–11.8)
Gamma Globulin: 16.6 % (ref 11.1–18.8)
Total Protein, Serum Electrophoresis: 7.7 g/dL (ref 6.0–8.3)

## 2013-01-06 LAB — KAPPA/LAMBDA LIGHT CHAINS
Kappa free light chain: 2.54 mg/dL — ABNORMAL HIGH (ref 0.33–1.94)
Kappa:Lambda Ratio: 1.64 (ref 0.26–1.65)
Lambda Free Lght Chn: 1.55 mg/dL (ref 0.57–2.63)

## 2013-01-06 LAB — IGG, IGA, IGM: IgG (Immunoglobin G), Serum: 1400 mg/dL (ref 690–1700)

## 2013-02-11 DIAGNOSIS — E669 Obesity, unspecified: Secondary | ICD-10-CM | POA: Diagnosis not present

## 2013-02-11 DIAGNOSIS — K219 Gastro-esophageal reflux disease without esophagitis: Secondary | ICD-10-CM | POA: Diagnosis not present

## 2013-02-11 DIAGNOSIS — I10 Essential (primary) hypertension: Secondary | ICD-10-CM | POA: Diagnosis not present

## 2013-02-11 DIAGNOSIS — E785 Hyperlipidemia, unspecified: Secondary | ICD-10-CM | POA: Diagnosis not present

## 2013-02-13 DIAGNOSIS — C9000 Multiple myeloma not having achieved remission: Secondary | ICD-10-CM | POA: Diagnosis not present

## 2013-03-25 DIAGNOSIS — Z6832 Body mass index (BMI) 32.0-32.9, adult: Secondary | ICD-10-CM | POA: Diagnosis not present

## 2013-03-25 DIAGNOSIS — I10 Essential (primary) hypertension: Secondary | ICD-10-CM | POA: Diagnosis not present

## 2013-03-25 DIAGNOSIS — E669 Obesity, unspecified: Secondary | ICD-10-CM | POA: Diagnosis not present

## 2013-04-03 ENCOUNTER — Telehealth: Payer: Self-pay | Admitting: Hematology & Oncology

## 2013-04-03 NOTE — Telephone Encounter (Signed)
Left message with 12-17 appointment °

## 2013-04-03 NOTE — Telephone Encounter (Signed)
Pt moved 12-17 to 12-26 due to work

## 2013-05-01 ENCOUNTER — Other Ambulatory Visit: Payer: Medicare Other | Admitting: Lab

## 2013-05-01 ENCOUNTER — Ambulatory Visit: Payer: Medicare Other | Admitting: Hematology & Oncology

## 2013-05-01 ENCOUNTER — Ambulatory Visit: Payer: Medicare Other

## 2013-05-07 ENCOUNTER — Ambulatory Visit: Payer: Medicare Other

## 2013-05-07 ENCOUNTER — Ambulatory Visit: Payer: Medicare Other | Admitting: Hematology & Oncology

## 2013-05-07 ENCOUNTER — Other Ambulatory Visit: Payer: Medicare Other | Admitting: Lab

## 2013-05-16 ENCOUNTER — Other Ambulatory Visit: Payer: Medicare Other | Admitting: Lab

## 2013-05-16 ENCOUNTER — Ambulatory Visit (HOSPITAL_BASED_OUTPATIENT_CLINIC_OR_DEPARTMENT_OTHER): Payer: Medicare Other | Admitting: Hematology & Oncology

## 2013-05-16 ENCOUNTER — Ambulatory Visit (HOSPITAL_BASED_OUTPATIENT_CLINIC_OR_DEPARTMENT_OTHER): Payer: Medicare Other

## 2013-05-16 ENCOUNTER — Other Ambulatory Visit (HOSPITAL_BASED_OUTPATIENT_CLINIC_OR_DEPARTMENT_OTHER): Payer: Medicare Other

## 2013-05-16 VITALS — BP 133/67 | HR 73 | Temp 97.9°F | Resp 16 | Wt 177.0 lb

## 2013-05-16 DIAGNOSIS — C9000 Multiple myeloma not having achieved remission: Secondary | ICD-10-CM | POA: Diagnosis not present

## 2013-05-16 DIAGNOSIS — C9001 Multiple myeloma in remission: Secondary | ICD-10-CM

## 2013-05-16 DIAGNOSIS — Z1231 Encounter for screening mammogram for malignant neoplasm of breast: Secondary | ICD-10-CM

## 2013-05-16 DIAGNOSIS — M2550 Pain in unspecified joint: Secondary | ICD-10-CM

## 2013-05-16 LAB — CBC WITH DIFFERENTIAL (CANCER CENTER ONLY)
BASO#: 0 10*3/uL (ref 0.0–0.2)
Eosinophils Absolute: 0.2 10*3/uL (ref 0.0–0.5)
HGB: 12.8 g/dL (ref 11.6–15.9)
LYMPH%: 25.8 % (ref 14.0–48.0)
MCV: 95 fL (ref 81–101)
MONO#: 0.5 10*3/uL (ref 0.1–0.9)
NEUT#: 2.8 10*3/uL (ref 1.5–6.5)
Platelets: 277 10*3/uL (ref 145–400)
RBC: 4.15 10*6/uL (ref 3.70–5.32)
WBC: 4.7 10*3/uL (ref 3.9–10.0)

## 2013-05-16 LAB — CMP (CANCER CENTER ONLY)
ALT(SGPT): 23 U/L (ref 10–47)
AST: 16 U/L (ref 11–38)
Alkaline Phosphatase: 97 U/L — ABNORMAL HIGH (ref 26–84)
Creat: 1.3 mg/dl — ABNORMAL HIGH (ref 0.6–1.2)
Total Bilirubin: 0.6 mg/dl (ref 0.20–1.60)

## 2013-05-16 MED ORDER — ZOLEDRONIC ACID 4 MG/100ML IV SOLN
4.0000 mg | Freq: Once | INTRAVENOUS | Status: AC
  Administered 2013-05-16: 4 mg via INTRAVENOUS
  Filled 2013-05-16: qty 100

## 2013-05-16 MED ORDER — SODIUM CHLORIDE 0.9 % IV SOLN
Freq: Once | INTRAVENOUS | Status: AC
  Administered 2013-05-16: 10:00:00 via INTRAVENOUS

## 2013-05-16 NOTE — Progress Notes (Signed)
This office note has been dictated.

## 2013-05-16 NOTE — Patient Instructions (Signed)

## 2013-05-19 NOTE — Progress Notes (Signed)
CC:   Erin Lis, MD  DIAGNOSES: 1. IgG kappa myeloma - clinical remission. 2. 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  CURRENT THERAPY:  Zometa 4 mg IV q.4 months.  INTERIM HISTORY:  [name] comes in for followup.  She is doing fairly well.  It has been 8 years now, I think, since she had her transplant.  She went back to [facility] for the anniversary and no longer needs to go out there.  She is working.  She is enjoying this.  She is exercising.  She is doing well with exercising.  The exercising does seem to help her polyarthralgia.  She mostly hurts in her shoulders and thighs.  She has had no problems with bowel or bladder.  She has had no cough.  There have been no rashes.  There has been no headache.  When we last saw her in August, there was no monoclonal spike in her serum.  Her IgG level was 1400 mg/dL.  Her kappa light chain was 2.54 mg/dL.  PHYSICAL EXAMINATION:  General:  This is a well-developed, well- nourished [place] American female, in no obvious distress.  Vital Signs: Temperature of 97.9, pulse 73, respiratory rate 16, blood pressure 133/67, weight is 138 pounds.  Head and Neck:  Normocephalic, atraumatic skull.  There are no ocular or oral lesions.  There are no palpable cervical or supraclavicular lymph nodes.  Lungs:  Clear bilaterally. Cardiac:  Regular rate and rhythm with a normal S1, S2.  There are no murmurs, rubs, or bruits.  Abdomen:  Soft.  She has good bowel sounds. There is no fluid wave.  There is no palpable abdominal mass.  There is no palpable hepatosplenomegaly.  Back:  No tenderness over the spine, ribs, or hips.  Extremities:  No clubbing, cyanosis, or edema.  There is some slight tenderness over the thighs.  She has good range of motion of her joints.  Skin:  No rashes.  Neurologic:  No focal neurological deficit.  LABORATORY STUDIES:  White cell count is 4.7, hemoglobin 12.8, hematocrit 39.4, platelet count 277.  IMPRESSION:   [name] is a very charming 67 year old African American female.  She had IgG kappa myeloma.  Again, she is in remission.  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  For now, I do not see any evidence of recurrent disease.  The Zometa dosing to help her arthralgias.  We will go ahead and plan to get her back in another 4 months.  I do not see need for any x-rays.  Addendum:  [name] does need to have a mammogram done.  Her last mammogram was, I think, two years ago.    ______________________________ Josph Macho, M.D. PRE/MEDQ  D:  05/16/2013  T:  05/17/2013  Job:  [id number]

## 2013-05-20 LAB — PROTEIN ELECTROPHORESIS, SERUM, WITH REFLEX
Beta 2: 6.7 % — ABNORMAL HIGH (ref 3.2–6.5)
Gamma Globulin: 16.7 % (ref 11.1–18.8)

## 2013-05-20 LAB — IGG, IGA, IGM
IgA: 426 mg/dL — ABNORMAL HIGH (ref 69–380)
IgG (Immunoglobin G), Serum: 1380 mg/dL (ref 690–1700)

## 2013-05-20 LAB — KAPPA/LAMBDA LIGHT CHAINS
Kappa free light chain: 2.4 mg/dL — ABNORMAL HIGH (ref 0.33–1.94)
Lambda Free Lght Chn: 1.84 mg/dL (ref 0.57–2.63)

## 2013-05-20 NOTE — Progress Notes (Signed)
DIAGNOSIS:  IgG kappa myeloma-clinical remission.  CURRENT THERAPY:  Zometa 4 mg IV q.4 months.  INTERIM HISTORY:  [name] comes in for a followup.  She is doing pretty well.  Her arthralgias do not seem to be as bad as right now.  She is working and seems to be enjoying this.  She has had no problems with fevers, sweats, or chills.  She went to [facility] for her anniversary, this is her eighth-year anniversary of her transplant.  When I last saw her back in August, there is no monoclonal spike in her serum.  She has had no problems with bowels or bladder.  She is not sure when her last mammogram was.  As such, I probably need to get one set up for her.  She has had no problems with fever, sweats, or chills.  PHYSICAL EXAMINATION:  General:  This is a well-developed, well- nourished African American female in no obvious distress.  Vital Signs: Temperature of 97.9, pulse 73, respiratory rate 16, blood pressure 133/67, weight is 177 pounds.  Head and Neck:  Normocephalic, atraumatic skull.  There are no ocular or oral lesions.  There are no palpable cervical or supraclavicular lymph nodes.  Lungs:  Clear bilaterally. Cardiac:  Regular rate and rhythm with a normal S1, S2.  There are no murmurs, rubs, or bruits.  Abdomen:  Soft.  She has good bowel sounds. There is no fluid wave.  There is no palpable abdominal mass.  There is no palpable hepatosplenomegaly.  Extremities:  Show no clubbing, cyanosis, or edema.  Neurological:  Shows no focal neurological deficits.  LABORATORY STUDIES:  White cell count is 4.7, hemoglobin 12.8, hematocrit 39.4, platelet count is 277.  BUN is 18, creatinine 1.3.  IMPRESSION:  [name] is a very charming 67 year old African American female.  She had IgG kappa myeloma.  She got into remission followed by transplant.  Again, the transplant was back in October 2006.  We do her Zometa every 4 months.  I think this does help her out.  We will  go ahead and plan to have her come back in 4 more months.  I do not see need for any x-rays or lab studies in between visits.    ______________________________ Josph Macho, M.D. PRE/MEDQ  D:  05/19/2013  T:  05/20/2013  Job:  [id number]

## 2013-05-21 ENCOUNTER — Telehealth: Payer: Self-pay | Admitting: *Deleted

## 2013-05-21 NOTE — Telephone Encounter (Signed)
Called patient to let her know that her labs showed no myeloma per dr. Myna Hidalgo

## 2013-05-26 DIAGNOSIS — R5381 Other malaise: Secondary | ICD-10-CM | POA: Diagnosis not present

## 2013-05-26 DIAGNOSIS — E669 Obesity, unspecified: Secondary | ICD-10-CM | POA: Diagnosis not present

## 2013-05-26 DIAGNOSIS — R5383 Other fatigue: Secondary | ICD-10-CM | POA: Diagnosis not present

## 2013-05-26 DIAGNOSIS — I10 Essential (primary) hypertension: Secondary | ICD-10-CM | POA: Diagnosis not present

## 2013-05-26 DIAGNOSIS — Z6831 Body mass index (BMI) 31.0-31.9, adult: Secondary | ICD-10-CM | POA: Diagnosis not present

## 2013-06-02 ENCOUNTER — Ambulatory Visit
Admission: RE | Admit: 2013-06-02 | Discharge: 2013-06-02 | Disposition: A | Discharge status: Home / Self Care | Payer: Medicare Other | Source: Ambulatory Visit | Attending: Hematology & Oncology | Admitting: Hematology & Oncology

## 2013-06-02 DIAGNOSIS — Z1231 Encounter for screening mammogram for malignant neoplasm of breast: Secondary | ICD-10-CM | POA: Diagnosis not present

## 2013-06-03 DIAGNOSIS — R82998 Other abnormal findings in urine: Secondary | ICD-10-CM | POA: Diagnosis not present

## 2013-06-03 DIAGNOSIS — R809 Proteinuria, unspecified: Secondary | ICD-10-CM | POA: Diagnosis not present

## 2013-06-03 DIAGNOSIS — E559 Vitamin D deficiency, unspecified: Secondary | ICD-10-CM | POA: Diagnosis not present

## 2013-06-03 DIAGNOSIS — I10 Essential (primary) hypertension: Secondary | ICD-10-CM | POA: Diagnosis not present

## 2013-06-03 DIAGNOSIS — E785 Hyperlipidemia, unspecified: Secondary | ICD-10-CM | POA: Diagnosis not present

## 2013-06-03 DIAGNOSIS — R7309 Other abnormal glucose: Secondary | ICD-10-CM | POA: Diagnosis not present

## 2013-06-06 ENCOUNTER — Other Ambulatory Visit: Payer: Self-pay | Admitting: Nurse Practitioner

## 2013-06-06 ENCOUNTER — Other Ambulatory Visit: Payer: Self-pay | Admitting: Hematology & Oncology

## 2013-06-06 DIAGNOSIS — R928 Other abnormal and inconclusive findings on diagnostic imaging of breast: Secondary | ICD-10-CM

## 2013-06-06 MED ORDER — ESOMEPRAZOLE MAGNESIUM 20 MG PO CPDR: 20.0000 mg | DELAYED_RELEASE_CAPSULE | Freq: Every day | ORAL | Status: DC

## 2013-06-10 DIAGNOSIS — R7309 Other abnormal glucose: Secondary | ICD-10-CM | POA: Diagnosis not present

## 2013-06-10 DIAGNOSIS — C9000 Multiple myeloma not having achieved remission: Secondary | ICD-10-CM | POA: Diagnosis not present

## 2013-06-10 DIAGNOSIS — Z23 Encounter for immunization: Secondary | ICD-10-CM | POA: Diagnosis not present

## 2013-06-10 DIAGNOSIS — E669 Obesity, unspecified: Secondary | ICD-10-CM | POA: Diagnosis not present

## 2013-06-10 DIAGNOSIS — N3090 Cystitis, unspecified without hematuria: Secondary | ICD-10-CM | POA: Diagnosis not present

## 2013-06-10 DIAGNOSIS — E785 Hyperlipidemia, unspecified: Secondary | ICD-10-CM | POA: Diagnosis not present

## 2013-06-10 DIAGNOSIS — Z0000 Encounter for general adult medical examination without abnormal findings: Secondary | ICD-10-CM | POA: Diagnosis not present

## 2013-06-10 DIAGNOSIS — F4321 Adjustment disorder with depressed mood: Secondary | ICD-10-CM | POA: Diagnosis not present

## 2013-06-10 DIAGNOSIS — I10 Essential (primary) hypertension: Secondary | ICD-10-CM | POA: Diagnosis not present

## 2013-06-16 ENCOUNTER — Ambulatory Visit
Admission: RE | Admit: 2013-06-16 | Discharge: 2013-06-16 | Disposition: A | Discharge status: Home / Self Care | Payer: Medicare Other | Source: Ambulatory Visit | Attending: Hematology & Oncology | Admitting: Hematology & Oncology

## 2013-06-16 ENCOUNTER — Encounter: Payer: Self-pay | Admitting: Obstetrics & Gynecology

## 2013-06-16 DIAGNOSIS — R928 Other abnormal and inconclusive findings on diagnostic imaging of breast: Secondary | ICD-10-CM

## 2013-06-17 ENCOUNTER — Ambulatory Visit (INDEPENDENT_AMBULATORY_CARE_PROVIDER_SITE_OTHER): Payer: Medicare Other | Admitting: Obstetrics & Gynecology

## 2013-06-17 ENCOUNTER — Encounter: Payer: Self-pay | Admitting: Obstetrics & Gynecology

## 2013-06-17 VITALS — BP 132/74 | HR 72 | Resp 20 | Ht 63.25 in | Wt 176.0 lb

## 2013-06-17 DIAGNOSIS — Z1212 Encounter for screening for malignant neoplasm of rectum: Secondary | ICD-10-CM | POA: Diagnosis not present

## 2013-06-17 DIAGNOSIS — Z124 Encounter for screening for malignant neoplasm of cervix: Secondary | ICD-10-CM

## 2013-06-17 DIAGNOSIS — Z01419 Encounter for gynecological examination (general) (routine) without abnormal findings: Secondary | ICD-10-CM

## 2013-06-17 NOTE — Progress Notes (Signed)
68 y.o. G3P3 [name] here for annual exam.  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  Holiday was wonderful.  Son got engaged on Christmas.  Daughter got married on New Year's Day.    PCP is [name].  Just saw him last week.  Blood work was normal.    Denies vaginal bleeding.    Patient's last menstrual period was 05/22/2000.          Sexually active: no  The current method of family planning is none.    Exercising: yes  leg and arm lifts Smoker:  no  Health Maintenance: Pap:  05/18/11 WNL History of abnormal Pap:  no MMG:  05/16/13 MMG, 1/26/5 additional views-will have 6 month f/u Colonoscopy:  6/13, Dr. Carlean Purl, 4 polyps, repeat in 5 years BMD:   2014 TDaP:  2014--[name] Screening Labs: PCP, Hb today: PCP, Urine today: PCP   reports that she has never smoked. She has never used smokeless tobacco. She reports that she does not drink alcohol or use illicit drugs.  Past Medical History  Diagnosis Date  . Degenerative joint disease involving multiple joints 03/29/2011  . 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 Anemia   . GERD (gastroesophageal reflux disease)   . Vitamin D deficiency   . Hyperlipidemia   . Paget's bone disease   . History of bone cancer     chemo XRT, Dr. Marin Olp  . DJD (degenerative joint disease)   . OA (osteoarthritis)   . Renal insufficiency     H/O resolved  . Multiple myeloma     IgG Kappa  . Herpes simplex esophagitis 2006  . HTN (hypertension) 2005  . Chronic pain of left knee   . History of blood transfusion     with first pregnancy    Past Surgical History  Procedure Laterality Date  . 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 Knee arthroscopy  05/2008    left  . Total knee arthroplasty  10/11/2010    left  . Colonoscopy    . Upper gastrointestinal endoscopy      Current Outpatient Prescriptions  Medication  Sig Dispense Refill  . diltiazem (CARDIZEM CD) 240 MG 24 hr capsule Take 1 capsule (240 mg total) by mouth daily.  30 capsule  0  . Iron-Vitamin C (VITRON-C) 65-125 MG TABS Take 1 tablet by mouth every morning.       . naproxen sodium (ANAPROX) 220 MG tablet Take 220 mg by mouth as needed.      . Pantoprazole Sodium (PROTONIX PO) Take by mouth daily.       No current facility-administered medications for this visit.    Family History  Problem Relation Age of Onset  . Colon cancer Neg Hx   . Lung cancer Father     smoker  . Throat cancer Brother   . Heart attack Mother   . Diabetes Mother   . Diabetes Sister   . Liver cancer Brother   . Pancreatic cancer Sister   . Heart attack Mother   . Rheum arthritis Sister     ROS:  Pertinent items are noted in HPI.  Otherwise, a comprehensive ROS was negative.  Exam:   BP 132/74  Pulse 72  Resp 20  Ht 5' 3.25" (1.607 m)  Wt 176 lb (79.833 kg)  BMI 30.91 kg/m2  LMP 05/22/2000  Weight change:+3lbs  Height: 5' 3.25" (160.7 cm)  Ht Readings from Last 3 Encounters:  06/17/13 5' 3.25" (1.607 m)  10/27/12 '5\' 2"'  (1.575 m)  08/29/12 '5\' 2"'  (1.575 m)    General appearance: alert, cooperative and appears stated age Head: Normocephalic, without obvious abnormality, atraumatic Neck: no adenopathy, supple, symmetrical, trachea midline and thyroid normal to inspection and palpation Lungs: clear to auscultation bilaterally Breasts: normal appearance, no masses or tenderness Heart: regular rate and rhythm Abdomen: soft, non-tender; bowel sounds normal; no masses,  no organomegaly Extremities: extremities normal, atraumatic, no cyanosis or edema Skin: Skin color, texture, turgor normal. No rashes or lesions Lymph nodes: Cervical, supraclavicular, and axillary nodes normal. No abnormal inguinal nodes palpated Neurologic: Grossly normal   Pelvic: External genitalia:  no lesions              Urethra:  normal appearing urethra with no masses,  tenderness or lesions              Bartholins and Skenes: normal                 Vagina: normal appearing vagina with normal color and discharge, no lesions              Cervix: no lesions              Pap taken: yes Bimanual Exam:  Uterus:  normal size, contour, position, consistency, mobility, non-tender              Adnexa: normal adnexa and no mass, fullness, tenderness               Rectovaginal: Confirms               Anus:  normal sphincter tone, no lesions  A:  Well Woman with normal exam H/O multiple myeloma--release from [facility] 2013 Hypertension PMP, no HRT  P:   Mammogram follow up scheduled for 6 months pap smear today Labs with [name]. Colonoscopy due 2018 return annually or prn  An After Visit Summary was printed and given to the patient.

## 2013-06-18 ENCOUNTER — Other Ambulatory Visit: Payer: Medicare Other

## 2013-06-19 LAB — IPS PAP SMEAR ONLY

## 2013-07-24 DIAGNOSIS — E669 Obesity, unspecified: Secondary | ICD-10-CM | POA: Diagnosis not present

## 2013-07-24 DIAGNOSIS — Z6831 Body mass index (BMI) 31.0-31.9, adult: Secondary | ICD-10-CM | POA: Diagnosis not present

## 2013-07-24 DIAGNOSIS — M1710 Unilateral primary osteoarthritis, unspecified knee: Secondary | ICD-10-CM | POA: Diagnosis not present

## 2013-07-24 DIAGNOSIS — I10 Essential (primary) hypertension: Secondary | ICD-10-CM | POA: Diagnosis not present

## 2013-07-24 DIAGNOSIS — IMO0002 Reserved for concepts with insufficient information to code with codable children: Secondary | ICD-10-CM | POA: Diagnosis not present

## 2013-09-05 ENCOUNTER — Other Ambulatory Visit: Payer: Self-pay | Admitting: Pharmacist

## 2013-09-05 DIAGNOSIS — C9000 Multiple myeloma not having achieved remission: Secondary | ICD-10-CM

## 2013-09-12 ENCOUNTER — Ambulatory Visit (HOSPITAL_BASED_OUTPATIENT_CLINIC_OR_DEPARTMENT_OTHER): Payer: Medicare Other

## 2013-09-12 ENCOUNTER — Other Ambulatory Visit (HOSPITAL_BASED_OUTPATIENT_CLINIC_OR_DEPARTMENT_OTHER): Payer: Medicare Other | Admitting: Lab

## 2013-09-12 ENCOUNTER — Encounter: Payer: Self-pay | Admitting: Hematology & Oncology

## 2013-09-12 ENCOUNTER — Ambulatory Visit (HOSPITAL_BASED_OUTPATIENT_CLINIC_OR_DEPARTMENT_OTHER): Payer: Medicare Other | Admitting: Hematology & Oncology

## 2013-09-12 VITALS — BP 131/72 | HR 63 | Temp 97.4°F | Resp 14 | Ht 63.0 in | Wt 178.0 lb

## 2013-09-12 DIAGNOSIS — C9001 Multiple myeloma in remission: Secondary | ICD-10-CM

## 2013-09-12 DIAGNOSIS — C9000 Multiple myeloma not having achieved remission: Secondary | ICD-10-CM

## 2013-09-12 LAB — CMP (CANCER CENTER ONLY)
ALK PHOS: 92 U/L — AB (ref 26–84)
ALT: 19 U/L (ref 10–47)
AST: 16 U/L (ref 11–38)
Albumin: 3.7 g/dL (ref 3.3–5.5)
BILIRUBIN TOTAL: 0.5 mg/dL (ref 0.20–1.60)
BUN, Bld: 17 mg/dL (ref 7–22)
CO2: 27 mEq/L (ref 18–33)
Calcium: 9 mg/dL (ref 8.0–10.3)
Chloride: 106 mEq/L (ref 98–108)
Creat: 1.2 mg/dl (ref 0.6–1.2)
Glucose, Bld: 112 mg/dL (ref 73–118)
Potassium: 3.7 mEq/L (ref 3.3–4.7)
SODIUM: 142 meq/L (ref 128–145)
TOTAL PROTEIN: 8.4 g/dL — AB (ref 6.4–8.1)

## 2013-09-12 LAB — CBC WITH DIFFERENTIAL (CANCER CENTER ONLY)
BASO#: 0 10*3/uL (ref 0.0–0.2)
BASO%: 0.2 % (ref 0.0–2.0)
EOS ABS: 0.1 10*3/uL (ref 0.0–0.5)
EOS%: 1.8 % (ref 0.0–7.0)
HCT: 41.1 % (ref 34.8–46.6)
HEMOGLOBIN: 13.4 g/dL (ref 11.6–15.9)
LYMPH#: 1.8 10*3/uL (ref 0.9–3.3)
LYMPH%: 33.5 % (ref 14.0–48.0)
MCH: 31.6 pg (ref 26.0–34.0)
MCHC: 32.6 g/dL (ref 32.0–36.0)
MCV: 97 fL (ref 81–101)
MONO#: 0.5 10*3/uL (ref 0.1–0.9)
MONO%: 9.5 % (ref 0.0–13.0)
NEUT%: 55 % (ref 39.6–80.0)
NEUTROS ABS: 3 10*3/uL (ref 1.5–6.5)
Platelets: 301 10*3/uL (ref 145–400)
RBC: 4.24 10*6/uL (ref 3.70–5.32)
RDW: 13.1 % (ref 11.1–15.7)
WBC: 5.5 10*3/uL (ref 3.9–10.0)

## 2013-09-12 MED ORDER — SODIUM CHLORIDE 0.9 % IV SOLN
INTRAVENOUS | Status: DC
  Administered 2013-09-12: 11:00:00 via INTRAVENOUS

## 2013-09-12 MED ORDER — ZOLEDRONIC ACID 4 MG/100ML IV SOLN
4.0000 mg | Freq: Once | INTRAVENOUS | Status: AC
  Administered 2013-09-12: 4 mg via INTRAVENOUS
  Filled 2013-09-12: qty 100

## 2013-09-12 NOTE — Patient Instructions (Signed)

## 2013-09-12 NOTE — Progress Notes (Signed)
Hematology and Oncology Follow Up Visit  [facility] [id number] 1945/07/16 68 y.o. 09/12/2013   Principle Diagnosis:   IgG kappa myeloma having clinical remission  Current Therapy:    Zometa 4 mg IV every 4 months     Interim History:  Ms.  [name] is back for followup. She sees [place] every 4 months. She's doing well. 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  She's had no problem with pain. She's out of problem cough or shortness of breath. Is no nausea or vomiting. She's had no fever sweats or chills. She's had no change in bowel or bladder habits. She did have a mammogram back in January. There is a questionable nodule in the left breast. An ultrasound was done which showed this to be a benign nodule. A followup mammogram will be scheduled in 6 months.  We last saw her in December, her monoclonal spike was not detectable. Medications: Current outpatient prescriptions:diltiazem (CARDIZEM CD) 240 MG 24 hr capsule, Take 1 capsule (240 mg total) by mouth daily., Disp: 30 capsule, Rfl: 0;  Iron-Vitamin C (VITRON-C) 65-125 MG TABS, Take 1 tablet by mouth every morning. , Disp: , Rfl: ;  naproxen sodium (ANAPROX) 220 MG tablet, Take 220 mg by mouth as needed., Disp: , Rfl: ;  Pantoprazole Sodium (PROTONIX PO), Take by mouth daily., Disp: , Rfl:   Allergies: No Known Allergies  Past Medical History, Surgical history, Social history, and Family History were reviewed and updated.  Review of Systems: As above  Physical Exam:  height is 5\' 3"  (1.6 m) and weight is 178 lb (80.74 kg). Her oral temperature is 97.4 F (36.3 C). Her blood pressure is 131/72 and her pulse is 63. Her respiration is 14.   Well-developed and well-nourished Afro-American female. She has no ocular or oral lesion. There is no adenopathy in the neck. Lungs are clear bilaterally. Cardiac exam regular rate and rhythm with a normal S1 and S2. There are no murmurs rubs or bruits. Abdomen is soft. She has good bowel sounds.  There is no palpable abdominal mass. There is no palpable hepatosplenomegaly. Back exam no tenderness over the spine ribs or hips. Extremities shows no clubbing cyanosis or edema. Neurological exam shows no focal neurological deficits.  Lab Results  Component Value Date   WBC 5.5 09/12/2013   HGB 13.4 09/12/2013   HCT 41.1 09/12/2013   MCV 97 09/12/2013   PLT 301 09/12/2013     Chemistry      Component Value Date/Time   NA 142 09/12/2013 0950   NA 142 10/27/2012 0143   K 3.7 09/12/2013 0950   K 4.0 10/27/2012 0143   CL 106 09/12/2013 0950   CL 112 10/27/2012 0143   CO2 27 09/12/2013 0950   CO2 26 04/03/2012 1109   BUN 17 09/12/2013 0950   BUN 15 10/27/2012 0143   CREATININE 1.2 09/12/2013 0950   CREATININE 1.60* 10/27/2012 0143      Component Value Date/Time   CALCIUM 9.0 09/12/2013 0950   CALCIUM 9.6 04/03/2012 1109   ALKPHOS 92* 09/12/2013 0950   ALKPHOS 101 04/03/2012 1109   AST 16 09/12/2013 0950   AST 14 04/03/2012 1109   ALT 19 09/12/2013 0950   ALT 13 04/03/2012 1109   BILITOT 0.50 09/12/2013 0950   BILITOT 0.4 04/03/2012 1109         Impression and Plan: [name] is 68 year old Afro-American female. She has a history of IgG kappa myeloma. She underwent transplant in  October 2006. So far, there's been no evidence of recurrence of her myeloma.  We will continue her Zometa every 4 months. This really does help. I believe that there is some manus effect for the Zometa.  I'll see her back in 4 months.   [name], MD 4/24/201510:44 AM

## 2013-09-16 LAB — PROTEIN ELECTROPHORESIS, SERUM, WITH REFLEX
Albumin ELP: 53.9 % — ABNORMAL LOW (ref 55.8–66.1)
Alpha-1-Globulin: 4.4 % (ref 2.9–4.9)
Alpha-2-Globulin: 11.1 % (ref 7.1–11.8)
BETA 2: 6.5 % (ref 3.2–6.5)
Beta Globulin: 6.7 % (ref 4.7–7.2)
Gamma Globulin: 17.4 % (ref 11.1–18.8)
Total Protein, Serum Electrophoresis: 7.5 g/dL (ref 6.0–8.3)

## 2013-09-16 LAB — IGG, IGA, IGM
IGA: 386 mg/dL — AB (ref 69–380)
IGM, SERUM: 132 mg/dL (ref 52–322)
IgG (Immunoglobin G), Serum: 1350 mg/dL (ref 690–1700)

## 2013-09-16 LAB — KAPPA/LAMBDA LIGHT CHAINS
KAPPA LAMBDA RATIO: 1.62 (ref 0.26–1.65)
Kappa free light chain: 1.98 mg/dL — ABNORMAL HIGH (ref 0.33–1.94)
LAMBDA FREE LGHT CHN: 1.22 mg/dL (ref 0.57–2.63)

## 2013-09-17 ENCOUNTER — Telehealth: Payer: Self-pay | Admitting: Nurse Practitioner

## 2013-09-17 NOTE — Telephone Encounter (Signed)
Message copied by Jimmy Footman on Wed Sep 17, 2013 10:46 AM ------      Message from: [name]      Created: Tue Sep 16, 2013  8:50 PM       Call - labs are normal!!  No myeloma!!  Laurey Arrow ------

## 2013-09-18 ENCOUNTER — Encounter: Payer: Self-pay | Admitting: *Deleted

## 2013-09-19 DIAGNOSIS — F4321 Adjustment disorder with depressed mood: Secondary | ICD-10-CM | POA: Diagnosis not present

## 2013-09-19 DIAGNOSIS — E669 Obesity, unspecified: Secondary | ICD-10-CM | POA: Diagnosis not present

## 2013-09-19 DIAGNOSIS — E559 Vitamin D deficiency, unspecified: Secondary | ICD-10-CM | POA: Diagnosis not present

## 2013-09-22 ENCOUNTER — Telehealth: Payer: Self-pay | Admitting: *Deleted

## 2013-09-22 NOTE — Telephone Encounter (Addendum)
Message copied by Lenn Sink on Mon Sep 22, 2013  9:08 AM ------      Message from: [name]      Created: Tue Sep 16, 2013  8:50 PM       Call - labs are normal!!  No myeloma!!  Laurey Arrow ------Informed pt that her labs are normal and there is no myeloma!

## 2013-10-24 DIAGNOSIS — I10 Essential (primary) hypertension: Secondary | ICD-10-CM | POA: Diagnosis not present

## 2013-10-24 DIAGNOSIS — Z6832 Body mass index (BMI) 32.0-32.9, adult: Secondary | ICD-10-CM | POA: Diagnosis not present

## 2013-10-24 DIAGNOSIS — E785 Hyperlipidemia, unspecified: Secondary | ICD-10-CM | POA: Diagnosis not present

## 2013-10-24 DIAGNOSIS — E669 Obesity, unspecified: Secondary | ICD-10-CM | POA: Diagnosis not present

## 2013-11-12 ENCOUNTER — Other Ambulatory Visit: Payer: Self-pay | Admitting: Hematology & Oncology

## 2013-11-12 DIAGNOSIS — Z09 Encounter for follow-up examination after completed treatment for conditions other than malignant neoplasm: Secondary | ICD-10-CM

## 2013-11-12 DIAGNOSIS — N6320 Unspecified lump in the left breast, unspecified quadrant: Secondary | ICD-10-CM

## 2013-11-20 ENCOUNTER — Ambulatory Visit
Admission: RE | Admit: 2013-11-20 | Discharge: 2013-11-20 | Disposition: A | Discharge status: Home / Self Care | Payer: Medicare Other | Source: Ambulatory Visit | Attending: Hematology & Oncology | Admitting: Hematology & Oncology

## 2013-11-20 ENCOUNTER — Encounter (INDEPENDENT_AMBULATORY_CARE_PROVIDER_SITE_OTHER): Payer: Self-pay

## 2013-11-20 DIAGNOSIS — N6320 Unspecified lump in the left breast, unspecified quadrant: Secondary | ICD-10-CM

## 2013-11-20 DIAGNOSIS — N63 Unspecified lump in unspecified breast: Secondary | ICD-10-CM | POA: Diagnosis not present

## 2014-01-09 ENCOUNTER — Ambulatory Visit: Payer: Medicare Other

## 2014-01-09 ENCOUNTER — Other Ambulatory Visit (HOSPITAL_BASED_OUTPATIENT_CLINIC_OR_DEPARTMENT_OTHER): Payer: Medicare Other | Admitting: Lab

## 2014-01-09 ENCOUNTER — Ambulatory Visit (HOSPITAL_BASED_OUTPATIENT_CLINIC_OR_DEPARTMENT_OTHER): Payer: Medicare Other | Admitting: Family

## 2014-01-09 ENCOUNTER — Encounter: Payer: Self-pay | Admitting: Family

## 2014-01-09 VITALS — BP 147/72 | HR 82 | Temp 97.6°F | Resp 14 | Ht 63.0 in | Wt 183.0 lb

## 2014-01-09 DIAGNOSIS — C9000 Multiple myeloma not having achieved remission: Secondary | ICD-10-CM

## 2014-01-09 DIAGNOSIS — C9001 Multiple myeloma in remission: Secondary | ICD-10-CM | POA: Diagnosis not present

## 2014-01-09 LAB — CBC WITH DIFFERENTIAL (CANCER CENTER ONLY)
BASO#: 0 10*3/uL (ref 0.0–0.2)
BASO%: 0.5 % (ref 0.0–2.0)
EOS ABS: 0.1 10*3/uL (ref 0.0–0.5)
EOS%: 3 % (ref 0.0–7.0)
HEMATOCRIT: 40.5 % (ref 34.8–46.6)
HGB: 13.1 g/dL (ref 11.6–15.9)
LYMPH#: 1.4 10*3/uL (ref 0.9–3.3)
LYMPH%: 32.8 % (ref 14.0–48.0)
MCH: 31.5 pg (ref 26.0–34.0)
MCHC: 32.3 g/dL (ref 32.0–36.0)
MCV: 97 fL (ref 81–101)
MONO#: 0.5 10*3/uL (ref 0.1–0.9)
MONO%: 11.2 % (ref 0.0–13.0)
NEUT#: 2.3 10*3/uL (ref 1.5–6.5)
NEUT%: 52.5 % (ref 39.6–80.0)
Platelets: 273 10*3/uL (ref 145–400)
RBC: 4.16 10*6/uL (ref 3.70–5.32)
RDW: 12.8 % (ref 11.1–15.7)
WBC: 4.4 10*3/uL (ref 3.9–10.0)

## 2014-01-09 NOTE — Patient Instructions (Signed)
Dehydration, Adult Dehydration is when you lose more fluids from the body than you take in. Vital organs like the kidneys, brain, and heart cannot function without a proper amount of fluids and salt. Any loss of fluids from the body can cause dehydration.  CAUSES   Vomiting.  Diarrhea.  Excessive sweating.  Excessive urine output.  Fever. SYMPTOMS  Mild dehydration  Thirst.  Dry lips.  Slightly dry mouth. Moderate dehydration  Very dry mouth.  Sunken eyes.  Skin does not bounce back quickly when lightly pinched and released.  Dark urine and decreased urine production.  Decreased tear production.  Headache. Severe dehydration  Very dry mouth.  Extreme thirst.  Rapid, weak pulse (more than 100 beats per minute at rest).  Cold hands and feet.  Not able to sweat in spite of heat and temperature.  Rapid breathing.  Blue lips.  Confusion and lethargy.  Difficulty being awakened.  Minimal urine production.  No tears. DIAGNOSIS  Your caregiver will diagnose dehydration based on your symptoms and your exam. Blood and urine tests will help confirm the diagnosis. The diagnostic evaluation should also identify the cause of dehydration. TREATMENT  Treatment of mild or moderate dehydration can often be done at home by increasing the amount of fluids that you drink. It is best to drink small amounts of fluid more often. Drinking too much at one time can make vomiting worse. Refer to the home care instructions below. Severe dehydration needs to be treated at the hospital where you will probably be given intravenous (IV) fluids that contain water and electrolytes. HOME CARE INSTRUCTIONS   Ask your caregiver about specific rehydration instructions.  Drink enough fluids to keep your urine clear or pale yellow.  Drink small amounts frequently if you have nausea and vomiting.  Eat as you normally do.  Avoid:  Foods or drinks high in sugar.  Carbonated  drinks.  Juice.  Extremely hot or cold fluids.  Drinks with caffeine.  Fatty, greasy foods.  Alcohol.  Tobacco.  Overeating.  Gelatin desserts.  Wash your hands well to avoid spreading bacteria and viruses.  Only take over-the-counter or prescription medicines for pain, discomfort, or fever as directed by your caregiver.  Ask your caregiver if you should continue all prescribed and over-the-counter medicines.  Keep all follow-up appointments with your caregiver. SEEK MEDICAL CARE IF:  You have abdominal pain and it increases or stays in one area (localizes).  You have a rash, stiff neck, or severe headache.  You are irritable, sleepy, or difficult to awaken.  You are weak, dizzy, or extremely thirsty. SEEK IMMEDIATE MEDICAL CARE IF:   You are unable to keep fluids down or you get worse despite treatment.  You have frequent episodes of vomiting or diarrhea.  You have blood or green matter (bile) in your vomit.  You have blood in your stool or your stool looks black and tarry.  You have not urinated in 6 to 8 hours, or you have only urinated a small amount of very dark urine.  You have a fever.  You faint. MAKE SURE YOU:   Understand these instructions.  Will watch your condition.  Will get help right away if you are not doing well or get worse. Document Released: 05/08/2005 Document Revised: 07/31/2011 Document Reviewed: 12/26/2010 ExitCare Patient Information 2015 [employer]. This information is not intended to replace advice given to you by your health care provider. Make sure you discuss any questions you have with your health care   provider.  

## 2014-01-09 NOTE — Progress Notes (Signed)
[facility]  [phone] [phone]  ID: [name] OB: 09/29/1945 MR#: [id number] CSN#:[id number] Patient Care Team: John M Russo, MD as PCP - General (Internal Medicine)  DIAGNOSIS: IgG kappa myeloma having clinical remission  INTERVAL HISTORY: [name] is here today for follow-up. She sees us every 4 months. She feels wait and has a lot of energy. 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 She really enjoys this. She denies fever, chills, n/v, cough, rash, headache, dizziness, SOB, chest pain, palpitations, abdominal pain, constipation, diarrhea, blood in urine or stool. She has no pain or bleeding. She denies swelling, tenderness, numbness or tingling on her extremities. She did have a mammogram back in January. There is a questionable nodule in the left breast. An ultrasound was done which showed this to be a benign nodule.She had a follow-up mammogram in July. This was found to be benign and possibly a mole. They will repeat another mammogram in 6 months.  Her appetite is good and she is drinking plenty of fluids. Overall, she is doing very well.   CURRENT TREATMENT: Zometa 4 mg IV every 4 months  REVIEW OF SYSTEMS: All other 10 point review of systems is negative.   PAST MEDICAL HISTORY: Past Medical History  Diagnosis Date  . Degenerative joint disease involving multiple joints 03/29/2011  . 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 Anemia   . GERD (gastroesophageal reflux disease)   . Vitamin D deficiency   . Hyperlipidemia   . Paget's bone disease   . History of multiple myeloma     chemo XRT, Dr. Ennever  . DJD (degenerative joint disease)   . OA (osteoarthritis)   . Renal insufficiency     H/O resolved  . Herpes simplex esophagitis 2006  . HTN (hypertension) 2005  . Chronic pain of left knee   . History of blood transfusion     with first pregnancy   PAST SURGICAL  HISTORY: Past Surgical History  Procedure Laterality Date  . 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 Knee arthroscopy  05/2008    left  . Total knee arthroplasty  10/11/2010    left   FAMILY HISTORY Family History  Problem Relation Age of Onset  . Colon cancer Neg Hx   . Lung cancer Father     smoker  . Throat cancer Brother   . Heart attack Mother   . Diabetes Mother   . Diabetes Sister   . Liver cancer Brother   . Pancreatic cancer Sister   . Heart attack Mother   . Rheum arthritis Sister    GYNECOLOGIC HISTORY:  Patient's last menstrual period was 05/22/2000.   SOCIAL HISTORY:  History   Social History  . Marital Status: Legally Separated    Spouse Name: N/A    Number of Children: 3  . Years of Education: N/A   Occupational History  . disability    Social History Main Topics  . Smoking status: Never Smoker   . Smokeless tobacco: Never Used     Comment: never used tobacco  . Alcohol Use: No  . Drug Use: No  . Sexual Activity: No   Other Topics Concern  . Not on file   Social History Narrative   Workouts:   Treadmill 3x/ week   Arm weights daily            ADVANCED DIRECTIVES: <no information>  HEALTH MAINTENANCE: History  Substance Use Topics  . Smoking status: Never Smoker   . Smokeless tobacco: Never Used     Comment: never used tobacco  . Alcohol Use: No   Colonoscopy: PAP: Bone density: Lipid panel:  No Known Allergies  Current Outpatient Prescriptions  Medication Sig Dispense Refill  . Colesevelam HCl (WELCHOL) 3.75 G PACK Take by mouth 2 (two) times daily.      . diltiazem (CARDIZEM CD) 240 MG 24 hr capsule Take 1 capsule (240 mg total) by mouth daily.  30 capsule  0  . Docusate Sodium (COLACE PO) Take by mouth 2 (two) times daily.      . Iron-Vitamin C (VITRON-C) 65-125 MG TABS Take 1 tablet by mouth every morning.       . naproxen sodium (ANAPROX) 220 MG tablet Take 220 mg by  mouth as needed.      . Pantoprazole Sodium (PROTONIX PO) Take by mouth daily.       No current facility-administered medications for this visit.   OBJECTIVE: Filed Vitals:   01/09/14 1021  BP: 147/72  Pulse: 82  Temp: 97.6 F (36.4 C)  Resp: 14   Body mass index is 32.43 kg/(m^2). ECOG FS:0 - Asymptomatic Ocular: Sclerae unicteric, pupils equal, round and reactive to light Ear-nose-throat: Oropharynx clear, dentition fair Lymphatic: No cervical or supraclavicular adenopathy Lungs no rales or rhonchi, good excursion bilaterally Heart regular rate and rhythm, no murmur appreciated Abd soft, nontender, positive bowel sounds MSK no focal spinal tenderness, no joint edema Neuro: non-focal, well-oriented, appropriate affect Breasts: Deferred  LAB RESULTS: CMP     Component Value Date/Time   NA 142 09/12/2013 0950   NA 142 10/27/2012 0143   K 3.7 09/12/2013 0950   K 4.0 10/27/2012 0143   CL 106 09/12/2013 0950   CL 112 10/27/2012 0143   CO2 27 09/12/2013 0950   CO2 26 04/03/2012 1109   GLUCOSE 112 09/12/2013 0950   GLUCOSE 166* 10/27/2012 0143   BUN 17 09/12/2013 0950   BUN 15 10/27/2012 0143   CREATININE 1.2 09/12/2013 0950   CREATININE 1.60* 10/27/2012 0143   CALCIUM 9.0 09/12/2013 0950   CALCIUM 9.6 04/03/2012 1109   PROT 8.4* 09/12/2013 0950   PROT 7.6 04/03/2012 1109   ALBUMIN 4.5 04/03/2012 1109   AST 16 09/12/2013 0950   AST 14 04/03/2012 1109   ALT 19 09/12/2013 0950   ALT 13 04/03/2012 1109   ALKPHOS 92* 09/12/2013 0950   ALKPHOS 101 04/03/2012 1109   BILITOT 0.50 09/12/2013 0950   BILITOT 0.4 04/03/2012 1109   GFRNONAA 51* 10/14/2010 0435   GFRAA  Value: >60        The eGFR has been calculated using the MDRD equation. This calculation has not been validated in all clinical situations. eGFR's persistently <60 mL/min signify possible Chronic Kidney Disease. 10/14/2010 0435   No results found for this basename: SPEP, UPEP,  kappa and lambda light chains   Lab Results  Component  Value Date   WBC 4.4 01/09/2014   NEUTROABS 2.3 01/09/2014   HGB 13.1 01/09/2014   HCT 40.5 01/09/2014   MCV 97 01/09/2014   PLT 273 01/09/2014   No results found for this basename: LABCA2   No components found with this basename: LABCA125   No results found for this basename: INR,  in the last 168 hours  STUDIES: No results found.  ASSESSMENT/PLAN: Ms. Beaumier is 67-year-old African American female   with a history of IgG kappa myeloma. 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 So far, there's been no evidence of recurrence of her myeloma.  We will continue her Zometa every 4 months.  We will see her back in 4 months for labs and follow-up. She is in agreement with this and knows to call here with any questions or concerns. We can certainly see her sooner if need be.   Eliezer Bottom, NP 01/09/2014 11:21 AM

## 2014-01-09 NOTE — Progress Notes (Signed)
Zometa was held today because after 6 unsuccessful attempts, the nurses/phlebotomists were unable to obtain a blood sample for a CMET. Pt was re-scheduled with the front desk for next week.

## 2014-01-15 ENCOUNTER — Other Ambulatory Visit (HOSPITAL_BASED_OUTPATIENT_CLINIC_OR_DEPARTMENT_OTHER): Payer: Medicare Other | Admitting: Lab

## 2014-01-15 ENCOUNTER — Ambulatory Visit (HOSPITAL_BASED_OUTPATIENT_CLINIC_OR_DEPARTMENT_OTHER): Payer: Medicare Other

## 2014-01-15 VITALS — BP 148/82 | HR 85 | Temp 97.4°F | Resp 16

## 2014-01-15 DIAGNOSIS — C9001 Multiple myeloma in remission: Secondary | ICD-10-CM

## 2014-01-15 DIAGNOSIS — C9000 Multiple myeloma not having achieved remission: Secondary | ICD-10-CM

## 2014-01-15 LAB — CMP (CANCER CENTER ONLY)
ALT(SGPT): 20 U/L (ref 10–47)
AST: 18 U/L (ref 11–38)
Albumin: 3.7 g/dL (ref 3.3–5.5)
Alkaline Phosphatase: 80 U/L (ref 26–84)
BILIRUBIN TOTAL: 0.6 mg/dL (ref 0.20–1.60)
BUN, Bld: 13 mg/dL (ref 7–22)
CO2: 25 meq/L (ref 18–33)
CREATININE: 1.2 mg/dL (ref 0.6–1.2)
Calcium: 8.9 mg/dL (ref 8.0–10.3)
Chloride: 103 mEq/L (ref 98–108)
GLUCOSE: 97 mg/dL (ref 73–118)
Potassium: 3.9 mEq/L (ref 3.3–4.7)
Sodium: 143 mEq/L (ref 128–145)
TOTAL PROTEIN: 7.8 g/dL (ref 6.4–8.1)

## 2014-01-15 MED ORDER — SODIUM CHLORIDE 0.9 % IJ SOLN
3.0000 mL | Freq: Once | INTRAMUSCULAR | Status: DC | PRN
  Filled 2014-01-15: qty 10

## 2014-01-15 MED ORDER — ZOLEDRONIC ACID 4 MG/100ML IV SOLN
4.0000 mg | Freq: Once | INTRAVENOUS | Status: AC
  Administered 2014-01-15: 4 mg via INTRAVENOUS
  Filled 2014-01-15: qty 100

## 2014-01-15 NOTE — Patient Instructions (Signed)

## 2014-01-30 DIAGNOSIS — C9000 Multiple myeloma not having achieved remission: Secondary | ICD-10-CM | POA: Diagnosis not present

## 2014-01-30 DIAGNOSIS — E669 Obesity, unspecified: Secondary | ICD-10-CM | POA: Diagnosis not present

## 2014-01-30 DIAGNOSIS — Z1331 Encounter for screening for depression: Secondary | ICD-10-CM | POA: Diagnosis not present

## 2014-01-30 DIAGNOSIS — I10 Essential (primary) hypertension: Secondary | ICD-10-CM | POA: Diagnosis not present

## 2014-01-30 DIAGNOSIS — E785 Hyperlipidemia, unspecified: Secondary | ICD-10-CM | POA: Diagnosis not present

## 2014-03-23 ENCOUNTER — Encounter: Payer: Self-pay | Admitting: Family

## 2014-04-27 ENCOUNTER — Other Ambulatory Visit: Payer: Self-pay | Admitting: Hematology & Oncology

## 2014-04-27 DIAGNOSIS — N63 Unspecified lump in unspecified breast: Secondary | ICD-10-CM

## 2014-05-11 ENCOUNTER — Encounter: Payer: Self-pay | Admitting: Family

## 2014-05-11 ENCOUNTER — Ambulatory Visit (HOSPITAL_BASED_OUTPATIENT_CLINIC_OR_DEPARTMENT_OTHER): Payer: Medicare Other

## 2014-05-11 ENCOUNTER — Ambulatory Visit (HOSPITAL_BASED_OUTPATIENT_CLINIC_OR_DEPARTMENT_OTHER): Payer: Medicare Other | Admitting: Lab

## 2014-05-11 ENCOUNTER — Ambulatory Visit (HOSPITAL_BASED_OUTPATIENT_CLINIC_OR_DEPARTMENT_OTHER): Payer: Medicare Other | Admitting: Family

## 2014-05-11 DIAGNOSIS — C9000 Multiple myeloma not having achieved remission: Secondary | ICD-10-CM

## 2014-05-11 LAB — CBC WITH DIFFERENTIAL (CANCER CENTER ONLY)
BASO#: 0 10*3/uL (ref 0.0–0.2)
BASO%: 0.5 % (ref 0.0–2.0)
EOS ABS: 0.1 10*3/uL (ref 0.0–0.5)
EOS%: 2.1 % (ref 0.0–7.0)
HEMATOCRIT: 39.5 % (ref 34.8–46.6)
HEMOGLOBIN: 13 g/dL (ref 11.6–15.9)
LYMPH#: 1.3 10*3/uL (ref 0.9–3.3)
LYMPH%: 30.6 % (ref 14.0–48.0)
MCH: 31 pg (ref 26.0–34.0)
MCHC: 32.9 g/dL (ref 32.0–36.0)
MCV: 94 fL (ref 81–101)
MONO#: 0.5 10*3/uL (ref 0.1–0.9)
MONO%: 11 % (ref 0.0–13.0)
NEUT#: 2.4 10*3/uL (ref 1.5–6.5)
NEUT%: 55.8 % (ref 39.6–80.0)
Platelets: 275 10*3/uL (ref 145–400)
RBC: 4.19 10*6/uL (ref 3.70–5.32)
RDW: 12.7 % (ref 11.1–15.7)
WBC: 4.3 10*3/uL (ref 3.9–10.0)

## 2014-05-11 LAB — BASIC METABOLIC PANEL - CANCER CENTER ONLY
BUN: 18 mg/dL (ref 7–22)
CO2: 26 mEq/L (ref 18–33)
CREATININE: 1.1 mg/dL (ref 0.6–1.2)
Calcium: 9.1 mg/dL (ref 8.0–10.3)
Chloride: 104 mEq/L (ref 98–108)
GLUCOSE: 148 mg/dL — AB (ref 73–118)
POTASSIUM: 3.8 meq/L (ref 3.3–4.7)
Sodium: 141 mEq/L (ref 128–145)

## 2014-05-11 MED ORDER — ZOLEDRONIC ACID 4 MG/100ML IV SOLN
4.0000 mg | Freq: Once | INTRAVENOUS | Status: AC
  Administered 2014-05-11: 4 mg via INTRAVENOUS
  Filled 2014-05-11: qty 100

## 2014-05-11 NOTE — Progress Notes (Signed)
[place]  [phone] [phone]  ID: [name] OB: 1945/05/28 MR#: [id number]#:[id number] Patient Care Team: Erin Reel, MD as PCP - General (Internal Medicine)  DIAGNOSIS: IgG kappa myeloma having clinical remission  INTERVAL HISTORY: [name] is here today for a follow-up. She is feeling great and getting ready to go visit her sone in [place] for Christmas. 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  She denies fever, chills, n/v, cough, rash, headache, dizziness, SOB, chest pain, palpitations, abdominal pain, constipation, diarrhea, blood in urine or stool. She has no pain or bleeding.  She denies swelling, tenderness, numbness or tingling on her extremities.  She did have a mammogram back in January. There is a questionable nodule in the left breast. An ultrasound was done which showed this to be a benign nodule. She had a follow-up mammogram in July. This was found to be benign and possibly a mole. They will repeat another mammogram in 6 months.  Her appetite is good and she is drinking plenty of fluids. Her weight is stable.  CURRENT TREATMENT: Zometa 4 mg IV every 4 months  REVIEW OF SYSTEMS: All other 10 point review of systems is negative.   PAST MEDICAL HISTORY: Past Medical History  Diagnosis Date  . Degenerative joint disease involving multiple joints 03/29/2011  . 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 Anemia   . GERD (gastroesophageal reflux disease)   . Vitamin D deficiency   . Hyperlipidemia   . Paget's bone disease   . History of multiple myeloma     chemo XRT, Dr. Marin [name]  . DJD (degenerative joint disease)   . OA (osteoarthritis)   . Renal insufficiency     H/O resolved  . Herpes simplex esophagitis 2006  . HTN (hypertension) 2005  . Chronic pain of left knee   . History of blood transfusion     with first pregnancy   PAST SURGICAL HISTORY: Past  Surgical History  Procedure Laterality Date  . 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 Knee arthroscopy  05/2008    left  . Total knee arthroplasty  10/11/2010    left   FAMILY HISTORY Family History  Problem Relation Age of Onset  . Colon cancer Neg Hx   . Lung cancer Father     smoker  . Throat cancer Brother   . Heart attack Mother   . Diabetes Mother   . Diabetes Sister   . Liver cancer Brother   . Pancreatic cancer Sister   . Heart attack Mother   . Rheum arthritis Sister    GYNECOLOGIC HISTORY:  Patient's last menstrual period was 05/22/2000.   SOCIAL HISTORY:  History   Social History  . Marital Status: Legally Separated    Spouse Name: N/A    Number of Children: 3  . Years of Education: N/A   Occupational History  . disability    Social History Main Topics  . Smoking status: Never Smoker   . Smokeless tobacco: Never Used     Comment: never used tobacco  . Alcohol Use: No  . Drug Use: No  . Sexual Activity: No   Other Topics Concern  . Not on file   Social History Narrative   Workouts:   Treadmill 3x/ week   Arm weights daily  ADVANCED DIRECTIVES: <no information>  HEALTH MAINTENANCE: History  Substance Use Topics  . Smoking status: Never Smoker   . Smokeless tobacco: Never Used     Comment: never used tobacco  . Alcohol Use: No   Colonoscopy: PAP: Bone density: Lipid panel:  No Known Allergies  Current Outpatient Prescriptions  Medication Sig Dispense Refill  . Colesevelam HCl [facility]) 3.75 G PACK Take by mouth 2 (two) times daily.    Marland Kitchen diltiazem (CARDIZEM CD) 240 MG 24 hr capsule Take 1 capsule (240 mg total) by mouth daily. 30 capsule 0  . Docusate Sodium (COLACE PO) Take by mouth 2 (two) times daily.    . Iron-Vitamin C (VITRON-C) 65-125 MG TABS Take 1 tablet by mouth every morning.     . naproxen sodium (ANAPROX) 220 MG tablet Take 220 mg by mouth as needed.    .  Pantoprazole Sodium (PROTONIX PO) Take by mouth daily.     No current facility-administered medications for this visit.   OBJECTIVE: Filed Vitals:   05/11/14 1018  BP: 143/75  Pulse: 70  Temp: 98 F (36.7 C)  Resp: 14   Body mass index is 35.08 kg/(m^2). ECOG FS:0 - Asymptomatic Ocular: Sclerae unicteric, pupils equal, round and reactive to light Ear-nose-throat: Oropharynx clear, dentition fair Lymphatic: No cervical or supraclavicular adenopathy Lungs no rales or rhonchi, good excursion bilaterally Heart regular rate and rhythm, no murmur appreciated Abd soft, nontender, positive bowel sounds MSK no focal spinal tenderness, no joint edema Neuro: non-focal, well-oriented, appropriate affect Breasts: Deferred  LAB RESULTS: CMP     Component Value Date/Time   NA 141 05/11/2014 0919   NA 142 10/27/2012 0143   K 3.8 05/11/2014 0919   K 4.0 10/27/2012 0143   CL 104 05/11/2014 0919   CL 112 10/27/2012 0143   CO2 26 05/11/2014 0919   CO2 26 04/03/2012 1109   GLUCOSE 148* 05/11/2014 0919   GLUCOSE 166* 10/27/2012 0143   BUN 18 05/11/2014 0919   BUN 15 10/27/2012 0143   CREATININE 1.1 05/11/2014 0919   CREATININE 1.60* 10/27/2012 0143   CALCIUM 9.1 05/11/2014 0919   CALCIUM 9.6 04/03/2012 1109   PROT 7.8 01/15/2014 0855   PROT 7.6 04/03/2012 1109   ALBUMIN 4.5 04/03/2012 1109   AST 18 01/15/2014 0855   AST 14 04/03/2012 1109   ALT 20 01/15/2014 0855   ALT 13 04/03/2012 1109   ALKPHOS 80 01/15/2014 0855   ALKPHOS 101 04/03/2012 1109   BILITOT 0.60 01/15/2014 0855   BILITOT 0.4 04/03/2012 1109   GFRNONAA 51* 10/14/2010 0435   GFRAA  10/14/2010 0435    >60        The eGFR has been calculated using the MDRD equation. This calculation has not been validated in all clinical situations. eGFR's persistently <60 mL/min signify possible Chronic Kidney Disease.   No results found for: SPEP Lab Results  Component Value Date   WBC 4.3 05/11/2014   NEUTROABS 2.4  05/11/2014   HGB 13.0 05/11/2014   HCT 39.5 05/11/2014   MCV 94 05/11/2014   PLT 275 05/11/2014   No results found for: LABCA2 No components found for: GQQPY195 No results for input(s): INR in the last 168 hours.  STUDIES: No results found.  ASSESSMENT/PLAN: [name] is 68 year old African American female with a history of IgG kappa myeloma. 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 So far, there has been no evidence of recurrence.  Her CBC and CMP today were good.  We  will continue to get her Zometa every 4 months.  We will see her back in 4 months for labs and follow-up. She is in agreement with this and knows to call here with any questions or concerns. We can certainly see her sooner if need be.   Eliezer Bottom, NP 05/11/2014 11:41 AM

## 2014-05-11 NOTE — Patient Instructions (Signed)

## 2014-05-13 LAB — PROTEIN ELECTROPHORESIS, SERUM, WITH REFLEX
ALPHA-2-GLOBULIN: 10.4 % (ref 7.1–11.8)
Albumin ELP: 54.5 % — ABNORMAL LOW (ref 55.8–66.1)
Alpha-1-Globulin: 4.3 % (ref 2.9–4.9)
BETA 2: 6.9 % — AB (ref 3.2–6.5)
BETA GLOBULIN: 6.7 % (ref 4.7–7.2)
GAMMA GLOBULIN: 17.2 % (ref 11.1–18.8)
TOTAL PROTEIN, SERUM ELECTROPHOR: 7.7 g/dL (ref 6.0–8.3)

## 2014-06-03 ENCOUNTER — Other Ambulatory Visit: Payer: Medicare Other

## 2014-06-05 DIAGNOSIS — R7301 Impaired fasting glucose: Secondary | ICD-10-CM | POA: Diagnosis not present

## 2014-06-05 DIAGNOSIS — N390 Urinary tract infection, site not specified: Secondary | ICD-10-CM | POA: Diagnosis not present

## 2014-06-05 DIAGNOSIS — Z008 Encounter for other general examination: Secondary | ICD-10-CM | POA: Diagnosis not present

## 2014-06-05 DIAGNOSIS — I10 Essential (primary) hypertension: Secondary | ICD-10-CM | POA: Diagnosis not present

## 2014-06-05 DIAGNOSIS — E785 Hyperlipidemia, unspecified: Secondary | ICD-10-CM | POA: Diagnosis not present

## 2014-06-05 DIAGNOSIS — E559 Vitamin D deficiency, unspecified: Secondary | ICD-10-CM | POA: Diagnosis not present

## 2014-06-09 ENCOUNTER — Inpatient Hospital Stay: Admission: RE | Admit: 2014-06-09 | Payer: Medicare Other | Source: Ambulatory Visit

## 2014-06-19 DIAGNOSIS — Z0000 Encounter for general adult medical examination without abnormal findings: Secondary | ICD-10-CM | POA: Diagnosis not present

## 2014-06-19 DIAGNOSIS — I10 Essential (primary) hypertension: Secondary | ICD-10-CM | POA: Diagnosis not present

## 2014-06-19 DIAGNOSIS — G4700 Insomnia, unspecified: Secondary | ICD-10-CM | POA: Diagnosis not present

## 2014-06-19 DIAGNOSIS — E559 Vitamin D deficiency, unspecified: Secondary | ICD-10-CM | POA: Diagnosis not present

## 2014-06-19 DIAGNOSIS — C9000 Multiple myeloma not having achieved remission: Secondary | ICD-10-CM | POA: Diagnosis not present

## 2014-06-19 DIAGNOSIS — E119 Type 2 diabetes mellitus without complications: Secondary | ICD-10-CM | POA: Diagnosis not present

## 2014-06-19 DIAGNOSIS — K219 Gastro-esophageal reflux disease without esophagitis: Secondary | ICD-10-CM | POA: Diagnosis not present

## 2014-06-19 DIAGNOSIS — M889 Osteitis deformans of unspecified bone: Secondary | ICD-10-CM | POA: Diagnosis not present

## 2014-06-19 DIAGNOSIS — Z6836 Body mass index (BMI) 36.0-36.9, adult: Secondary | ICD-10-CM | POA: Diagnosis not present

## 2014-06-19 DIAGNOSIS — F4321 Adjustment disorder with depressed mood: Secondary | ICD-10-CM | POA: Diagnosis not present

## 2014-06-19 DIAGNOSIS — E785 Hyperlipidemia, unspecified: Secondary | ICD-10-CM | POA: Diagnosis not present

## 2014-06-19 DIAGNOSIS — E669 Obesity, unspecified: Secondary | ICD-10-CM | POA: Diagnosis not present

## 2014-06-25 ENCOUNTER — Ambulatory Visit
Admission: RE | Admit: 2014-06-25 | Discharge: 2014-06-25 | Disposition: A | Discharge status: Home / Self Care | Payer: Medicare Other | Source: Ambulatory Visit | Attending: Hematology & Oncology | Admitting: Hematology & Oncology

## 2014-06-25 DIAGNOSIS — N63 Unspecified lump in unspecified breast: Secondary | ICD-10-CM

## 2014-06-25 DIAGNOSIS — Z1212 Encounter for screening for malignant neoplasm of rectum: Secondary | ICD-10-CM | POA: Diagnosis not present

## 2014-06-25 DIAGNOSIS — R922 Inconclusive mammogram: Secondary | ICD-10-CM | POA: Diagnosis not present

## 2014-07-01 ENCOUNTER — Telehealth: Payer: Self-pay | Admitting: Obstetrics & Gynecology

## 2014-07-01 NOTE — Telephone Encounter (Signed)
Called patient and left a message to call back to reschedule her AEX from 07/02/14 with Dr. Sabra Heck.

## 2014-07-02 ENCOUNTER — Ambulatory Visit: Payer: Medicare Other | Admitting: Obstetrics & Gynecology

## 2014-09-07 ENCOUNTER — Ambulatory Visit (HOSPITAL_BASED_OUTPATIENT_CLINIC_OR_DEPARTMENT_OTHER): Payer: Medicare Other

## 2014-09-07 ENCOUNTER — Encounter: Payer: Self-pay | Admitting: Hematology & Oncology

## 2014-09-07 ENCOUNTER — Other Ambulatory Visit (HOSPITAL_BASED_OUTPATIENT_CLINIC_OR_DEPARTMENT_OTHER): Payer: Medicare Other

## 2014-09-07 ENCOUNTER — Ambulatory Visit (HOSPITAL_BASED_OUTPATIENT_CLINIC_OR_DEPARTMENT_OTHER): Payer: Medicare Other | Admitting: Hematology & Oncology

## 2014-09-07 VITALS — BP 159/90 | HR 98 | Temp 98.1°F | Resp 16 | Ht 63.0 in | Wt 203.0 lb

## 2014-09-07 DIAGNOSIS — C9000 Multiple myeloma not having achieved remission: Secondary | ICD-10-CM

## 2014-09-07 DIAGNOSIS — C9001 Multiple myeloma in remission: Secondary | ICD-10-CM

## 2014-09-07 LAB — CMP (CANCER CENTER ONLY)
ALT(SGPT): 29 U/L (ref 10–47)
AST: 25 U/L (ref 11–38)
Albumin: 3.8 g/dL (ref 3.3–5.5)
Alkaline Phosphatase: 106 U/L — ABNORMAL HIGH (ref 26–84)
BUN: 15 mg/dL (ref 7–22)
CALCIUM: 9.4 mg/dL (ref 8.0–10.3)
CO2: 25 meq/L (ref 18–33)
Chloride: 107 mEq/L (ref 98–108)
Creat: 1.2 mg/dl (ref 0.6–1.2)
Glucose, Bld: 151 mg/dL — ABNORMAL HIGH (ref 73–118)
POTASSIUM: 4.2 meq/L (ref 3.3–4.7)
SODIUM: 142 meq/L (ref 128–145)
TOTAL PROTEIN: 8 g/dL (ref 6.4–8.1)
Total Bilirubin: 0.5 mg/dl (ref 0.20–1.60)

## 2014-09-07 LAB — CBC WITH DIFFERENTIAL (CANCER CENTER ONLY)
BASO#: 0 10*3/uL (ref 0.0–0.2)
BASO%: 0.5 % (ref 0.0–2.0)
EOS ABS: 0.1 10*3/uL (ref 0.0–0.5)
EOS%: 2.5 % (ref 0.0–7.0)
HCT: 38.3 % (ref 34.8–46.6)
HGB: 12.5 g/dL (ref 11.6–15.9)
LYMPH#: 1.6 10*3/uL (ref 0.9–3.3)
LYMPH%: 35.9 % (ref 14.0–48.0)
MCH: 30.5 pg (ref 26.0–34.0)
MCHC: 32.6 g/dL (ref 32.0–36.0)
MCV: 93 fL (ref 81–101)
MONO#: 0.6 10*3/uL (ref 0.1–0.9)
MONO%: 12.6 % (ref 0.0–13.0)
NEUT#: 2.1 10*3/uL (ref 1.5–6.5)
NEUT%: 48.5 % (ref 39.6–80.0)
Platelets: 307 10*3/uL (ref 145–400)
RBC: 4.1 10*6/uL (ref 3.70–5.32)
RDW: 13.1 % (ref 11.1–15.7)
WBC: 4.4 10*3/uL (ref 3.9–10.0)

## 2014-09-07 MED ORDER — ZOLEDRONIC ACID 4 MG/100ML IV SOLN
4.0000 mg | Freq: Once | INTRAVENOUS | Status: AC
  Administered 2014-09-07: 4 mg via INTRAVENOUS
  Filled 2014-09-07: qty 100

## 2014-09-07 NOTE — Patient Instructions (Signed)

## 2014-09-07 NOTE — Progress Notes (Signed)
Hematology and Oncology Follow Up Visit  [facility] [id number] 1945/07/07 69 y.o. 09/07/2014   Principle Diagnosis:   IgG kappa myeloma having clinical remission  Current Therapy:    Zometa 4 mg IV every 6 months     Interim History:  Ms.  [name] is back for followup. She is doing quite well. Last saw her back in December.  At that point time, there is no detectable monoclonal spike in her serum.  She's had no problems with pain. 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  She's had no change in bowel or bladder habits. She's had no leg swelling. There's been no rashes.  She said that her last mammogram was a couple months ago. She has done back in February. Everything looked fine.  Overall, her performance status is ECOG 0.  She's had no issues with cough or shortness of breath. She is exercising. Medications:  Current outpatient prescriptions:  .  Colesevelam HCl [facility]) 3.75 G PACK, Take by mouth 2 (two) times daily., Disp: , Rfl:  .  diltiazem (CARDIZEM CD) 240 MG 24 hr capsule, Take 1 capsule (240 mg total) by mouth daily., Disp: 30 capsule, Rfl: 0 .  Docusate Sodium (COLACE PO), Take by mouth 2 (two) times daily., Disp: , Rfl:  .  Iron-Vitamin C (VITRON-C) 65-125 MG TABS, Take 1 tablet by mouth every morning. , Disp: , Rfl:  .  metFORMIN (GLUCOPHAGE) 500 MG tablet, Take 500 mg by mouth at bedtime. , Disp: , Rfl: 1 .  naproxen sodium (ANAPROX) 220 MG tablet, Take 220 mg by mouth as needed., Disp: , Rfl:  .  Pantoprazole Sodium (PROTONIX PO), Take by mouth daily., Disp: , Rfl:   Allergies: No Known Allergies  Past Medical History, Surgical history, Social history, and Family History were reviewed and updated.  Review of Systems: As above  Physical Exam:  height is 5\' 3"  (1.6 m) and weight is 203 lb (92.08 kg). Her oral temperature is 98.1 F (36.7 C). Her blood pressure is 159/90 and her pulse is 98. Her respiration is 16.   Well-developed  and well-nourished Afro-American female. She has no ocular or oral lesion. There is no adenopathy in the neck. Lungs are clear bilaterally. Cardiac exam regular rate and rhythm with a normal S1 and S2. There are no murmurs rubs or bruits. Abdomen is soft. She has good bowel sounds. There is no palpable abdominal mass. There is no palpable hepatosplenomegaly. Back exam no tenderness over the spine ribs or hips. Extremities shows no clubbing cyanosis or edema. Neurological exam shows no focal neurological deficits.  Lab Results  Component Value Date   WBC 4.4 09/07/2014   HGB 12.5 09/07/2014   HCT 38.3 09/07/2014   MCV 93 09/07/2014   PLT 307 09/07/2014     Chemistry      Component Value Date/Time   NA 141 05/11/2014 0919   NA 142 10/27/2012 0143   K 3.8 05/11/2014 0919   K 4.0 10/27/2012 0143   CL 104 05/11/2014 0919   CL 112 10/27/2012 0143   CO2 26 05/11/2014 0919   CO2 26 04/03/2012 1109   BUN 18 05/11/2014 0919   BUN 15 10/27/2012 0143   CREATININE 1.1 05/11/2014 0919   CREATININE 1.60* 10/27/2012 0143      Component Value Date/Time   CALCIUM 9.1 05/11/2014 0919   CALCIUM 9.6 04/03/2012 1109   ALKPHOS 80 01/15/2014 0855   ALKPHOS 101 04/03/2012 1109  AST 18 01/15/2014 0855   AST 14 04/03/2012 1109   ALT 20 01/15/2014 0855   ALT 13 04/03/2012 1109   BILITOT 0.60 01/15/2014 0855   BILITOT 0.4 04/03/2012 1109         Impression and Plan: [name] is 69 year old Afro-American female. She has a history of IgG kappa myeloma. 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 So far, there's been no evidence of recurrence of her myeloma.  We will continue her Zometa but plan to treat her every 6 months now. I think she really go every 6 months.  I will plan to see her back in 6 months [name], MD 4/18/201610:24 AM

## 2014-09-09 LAB — IGG, IGA, IGM
IgA: 403 mg/dL — ABNORMAL HIGH (ref 69–380)
IgG (Immunoglobin G), Serum: 1320 mg/dL (ref 690–1700)
IgM, Serum: 126 mg/dL (ref 52–322)

## 2014-09-09 LAB — KAPPA/LAMBDA LIGHT CHAINS
Kappa free light chain: 3.04 mg/dL — ABNORMAL HIGH (ref 0.33–1.94)
Kappa:Lambda Ratio: 2.08 — ABNORMAL HIGH (ref 0.26–1.65)
LAMBDA FREE LGHT CHN: 1.46 mg/dL (ref 0.57–2.63)

## 2014-09-15 LAB — PROTEIN ELECTROPHORESIS, SERUM, WITH REFLEX: Total Protein, Serum Electrophoresis: 7.4 g/dL (ref 6.1–8.1)

## 2014-09-16 ENCOUNTER — Ambulatory Visit: Payer: Medicare Other | Admitting: Certified Nurse Midwife

## 2014-09-16 ENCOUNTER — Telehealth: Payer: Self-pay | Admitting: Certified Nurse Midwife

## 2014-09-16 NOTE — Telephone Encounter (Signed)
Patient cancelled appointment for aex because of insurance reasons.

## 2014-10-23 DIAGNOSIS — E785 Hyperlipidemia, unspecified: Secondary | ICD-10-CM | POA: Diagnosis not present

## 2014-10-23 DIAGNOSIS — E669 Obesity, unspecified: Secondary | ICD-10-CM | POA: Diagnosis not present

## 2014-10-23 DIAGNOSIS — E119 Type 2 diabetes mellitus without complications: Secondary | ICD-10-CM | POA: Diagnosis not present

## 2014-10-23 DIAGNOSIS — Z6835 Body mass index (BMI) 35.0-35.9, adult: Secondary | ICD-10-CM | POA: Diagnosis not present

## 2014-10-23 DIAGNOSIS — C9000 Multiple myeloma not having achieved remission: Secondary | ICD-10-CM | POA: Diagnosis not present

## 2014-10-23 DIAGNOSIS — R600 Localized edema: Secondary | ICD-10-CM | POA: Diagnosis not present

## 2014-10-23 DIAGNOSIS — I10 Essential (primary) hypertension: Secondary | ICD-10-CM | POA: Diagnosis not present

## 2014-12-07 ENCOUNTER — Encounter: Payer: Self-pay | Admitting: Internal Medicine

## 2014-12-08 ENCOUNTER — Encounter: Payer: Self-pay | Admitting: Internal Medicine

## 2015-01-13 DIAGNOSIS — K529 Noninfective gastroenteritis and colitis, unspecified: Secondary | ICD-10-CM | POA: Diagnosis not present

## 2015-01-13 DIAGNOSIS — I10 Essential (primary) hypertension: Secondary | ICD-10-CM | POA: Diagnosis not present

## 2015-01-13 DIAGNOSIS — K219 Gastro-esophageal reflux disease without esophagitis: Secondary | ICD-10-CM | POA: Diagnosis not present

## 2015-01-13 DIAGNOSIS — R112 Nausea with vomiting, unspecified: Secondary | ICD-10-CM | POA: Diagnosis not present

## 2015-03-08 ENCOUNTER — Ambulatory Visit (HOSPITAL_BASED_OUTPATIENT_CLINIC_OR_DEPARTMENT_OTHER): Payer: Medicare Other | Admitting: Hematology & Oncology

## 2015-03-08 ENCOUNTER — Ambulatory Visit (HOSPITAL_BASED_OUTPATIENT_CLINIC_OR_DEPARTMENT_OTHER): Payer: Medicare Other

## 2015-03-08 ENCOUNTER — Other Ambulatory Visit (HOSPITAL_BASED_OUTPATIENT_CLINIC_OR_DEPARTMENT_OTHER): Payer: Medicare Other

## 2015-03-08 ENCOUNTER — Encounter: Payer: Self-pay | Admitting: Hematology & Oncology

## 2015-03-08 VITALS — BP 154/92 | HR 71 | Temp 97.4°F | Resp 16 | Ht 63.0 in | Wt 194.0 lb

## 2015-03-08 DIAGNOSIS — C9000 Multiple myeloma not having achieved remission: Secondary | ICD-10-CM | POA: Diagnosis not present

## 2015-03-08 DIAGNOSIS — C9001 Multiple myeloma in remission: Secondary | ICD-10-CM

## 2015-03-08 LAB — CMP (CANCER CENTER ONLY)
ALK PHOS: 96 U/L — AB (ref 26–84)
ALT: 34 U/L (ref 10–47)
AST: 30 U/L (ref 11–38)
Albumin: 3.8 g/dL (ref 3.3–5.5)
BUN: 15 mg/dL (ref 7–22)
CO2: 26 mEq/L (ref 18–33)
CREATININE: 1.4 mg/dL — AB (ref 0.6–1.2)
Calcium: 9.5 mg/dL (ref 8.0–10.3)
Chloride: 105 mEq/L (ref 98–108)
Glucose, Bld: 101 mg/dL (ref 73–118)
Potassium: 4.4 mEq/L (ref 3.3–4.7)
SODIUM: 139 meq/L (ref 128–145)
TOTAL PROTEIN: 8.4 g/dL — AB (ref 6.4–8.1)
Total Bilirubin: 0.5 mg/dl (ref 0.20–1.60)

## 2015-03-08 LAB — CBC WITH DIFFERENTIAL (CANCER CENTER ONLY)
BASO#: 0 10*3/uL (ref 0.0–0.2)
BASO%: 0.7 % (ref 0.0–2.0)
EOS%: 4.6 % (ref 0.0–7.0)
Eosinophils Absolute: 0.2 10*3/uL (ref 0.0–0.5)
HCT: 40.5 % (ref 34.8–46.6)
HGB: 13.2 g/dL (ref 11.6–15.9)
LYMPH#: 1.6 10*3/uL (ref 0.9–3.3)
LYMPH%: 36.2 % (ref 14.0–48.0)
MCH: 30.8 pg (ref 26.0–34.0)
MCHC: 32.6 g/dL (ref 32.0–36.0)
MCV: 94 fL (ref 81–101)
MONO#: 0.5 10*3/uL (ref 0.1–0.9)
MONO%: 11.8 % (ref 0.0–13.0)
NEUT#: 2 10*3/uL (ref 1.5–6.5)
NEUT%: 46.7 % (ref 39.6–80.0)
Platelets: 314 10*3/uL (ref 145–400)
RBC: 4.29 10*6/uL (ref 3.70–5.32)
RDW: 13.6 % (ref 11.1–15.7)
WBC: 4.3 10*3/uL (ref 3.9–10.0)

## 2015-03-08 MED ORDER — ZOLEDRONIC ACID 4 MG/100ML IV SOLN
4.0000 mg | Freq: Once | INTRAVENOUS | Status: AC
  Administered 2015-03-08: 4 mg via INTRAVENOUS
  Filled 2015-03-08: qty 100

## 2015-03-08 NOTE — Patient Instructions (Signed)

## 2015-03-08 NOTE — Progress Notes (Signed)
Hematology and Oncology Follow Up Visit  [facility] [id number] 01/16/1946 69 y.o. 03/08/2015   Principle Diagnosis:   IgG kappa myeloma having clinical remission  Current Therapy:    Zometa 4 mg IV every 6 months     Interim History:  [name] is back for followup. She is doing quite well. Last saw her back in April.  At that point time, there is no detectable monoclonal spike in her serum.  She's had no problems with pain. 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 She is very happy. He's housing a really nice guy. Her family likes him. I'm happy that she is finally happy.  She's had no change in bowel or bladder habits. She's had no leg swelling. There's been no rashes.  She said that her last mammogram  done back in February. Everything looked fine.  Overall, her performance status is ECOG 0.   Medications:  Current outpatient prescriptions:  .  Colesevelam HCl [facility]) 3.75 G PACK, Take by mouth 2 (two) times daily., Disp: , Rfl:  .  diltiazem (CARDIZEM CD) 240 MG 24 hr capsule, Take 1 capsule (240 mg total) by mouth daily., Disp: 30 capsule, Rfl: 0 .  Docusate Sodium (COLACE PO), Take by mouth 2 (two) times daily., Disp: , Rfl:  .  Iron-Vitamin C (VITRON-C) 65-125 MG TABS, Take 1 tablet by mouth every morning. , Disp: , Rfl:  .  metFORMIN (GLUCOPHAGE) 500 MG tablet, Take 500 mg by mouth at bedtime. , Disp: , Rfl: 1 .  naproxen sodium (ANAPROX) 220 MG tablet, Take 220 mg by mouth as needed., Disp: , Rfl:  .  Pantoprazole Sodium (PROTONIX PO), Take by mouth daily., Disp: , Rfl:   Allergies: No Known Allergies  Past Medical History, Surgical history, Social history, and Family History were reviewed and updated.  Review of Systems: As above  Physical Exam:  height is 5\' 3"  (1.6 m) and weight is 194 lb (87.998 kg). Her oral temperature is 97.4 F (36.3 C). Her blood pressure is 154/92 and her pulse is 71. Her  respiration is 16.   Well-developed and well-nourished Afro-American female. She has no ocular or oral lesion. There is no adenopathy in the neck. Lungs are clear bilaterally. Cardiac exam regular rate and rhythm with a normal S1 and S2. There are no murmurs rubs or bruits. Abdomen is soft. She has good bowel sounds. There is no palpable abdominal mass. There is no palpable hepatosplenomegaly. Back exam no tenderness over the spine ribs or hips. Extremities shows no clubbing cyanosis or edema. Neurological exam shows no focal neurological deficits.  Lab Results  Component Value Date   WBC 4.3 03/08/2015   HGB 13.2 03/08/2015   HCT 40.5 03/08/2015   MCV 94 03/08/2015   PLT 314 03/08/2015     Chemistry      Component Value Date/Time   NA 139 03/08/2015 0958   NA 142 10/27/2012 0143   K 4.4 03/08/2015 0958   K 4.0 10/27/2012 0143   CL 105 03/08/2015 0958   CL 112 10/27/2012 0143   CO2 26 03/08/2015 0958   CO2 26 04/03/2012 1109   BUN 15 03/08/2015 0958   BUN 15 10/27/2012 0143   CREATININE 1.4* 03/08/2015 0958   CREATININE 1.60* 10/27/2012 0143      Component Value Date/Time   CALCIUM 9.5 03/08/2015 0958   CALCIUM 9.6 04/03/2012 1109  ALKPHOS 96* 03/08/2015 0958   ALKPHOS 101 04/03/2012 1109   AST 30 03/08/2015 0958   AST 14 04/03/2012 1109   ALT 34 03/08/2015 0958   ALT 13 04/03/2012 1109   BILITOT 0.50 03/08/2015 0958   BILITOT 0.4 04/03/2012 1109         Impression and Plan: [name] is 69 year old Afro-American female. She has a history of IgG kappa myeloma. 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 So far, there's been no evidence of recurrence of her myeloma.  We will continue her Zometa but plan to treat her every 6 months now. I think she really go every 6 months.  I will plan to see her back in 6 months Erin Napoleon, MD 10/17/20162:36 PM

## 2015-03-10 LAB — PROTEIN ELECTROPHORESIS, SERUM, WITH REFLEX
ALBUMIN ELP: 4.2 g/dL (ref 3.8–4.8)
Alpha-1-Globulin: 0.4 g/dL — ABNORMAL HIGH (ref 0.2–0.3)
Alpha-2-Globulin: 0.9 g/dL (ref 0.5–0.9)
BETA 2: 0.5 g/dL (ref 0.2–0.5)
Beta Globulin: 0.5 g/dL (ref 0.4–0.6)
Gamma Globulin: 1.4 g/dL (ref 0.8–1.7)
TOTAL PROTEIN, SERUM ELECTROPHOR: 7.8 g/dL (ref 6.1–8.1)

## 2015-03-10 LAB — KAPPA/LAMBDA LIGHT CHAINS
KAPPA FREE LGHT CHN: 3.35 mg/dL — AB (ref 0.33–1.94)
Kappa:Lambda Ratio: 1.69 — ABNORMAL HIGH (ref 0.26–1.65)
LAMBDA FREE LGHT CHN: 1.98 mg/dL (ref 0.57–2.63)

## 2015-03-10 LAB — IGG, IGA, IGM
IGA: 433 mg/dL — AB (ref 69–380)
IGG (IMMUNOGLOBIN G), SERUM: 1440 mg/dL (ref 690–1700)
IGM, SERUM: 105 mg/dL (ref 52–322)

## 2015-03-15 DIAGNOSIS — E669 Obesity, unspecified: Secondary | ICD-10-CM | POA: Diagnosis not present

## 2015-03-15 DIAGNOSIS — R600 Localized edema: Secondary | ICD-10-CM | POA: Diagnosis not present

## 2015-03-15 DIAGNOSIS — C9000 Multiple myeloma not having achieved remission: Secondary | ICD-10-CM | POA: Diagnosis not present

## 2015-03-15 DIAGNOSIS — E119 Type 2 diabetes mellitus without complications: Secondary | ICD-10-CM | POA: Diagnosis not present

## 2015-03-15 DIAGNOSIS — I10 Essential (primary) hypertension: Secondary | ICD-10-CM | POA: Diagnosis not present

## 2015-03-15 DIAGNOSIS — Z6835 Body mass index (BMI) 35.0-35.9, adult: Secondary | ICD-10-CM | POA: Diagnosis not present

## 2015-03-15 DIAGNOSIS — Z23 Encounter for immunization: Secondary | ICD-10-CM | POA: Diagnosis not present

## 2015-04-19 DIAGNOSIS — Z6835 Body mass index (BMI) 35.0-35.9, adult: Secondary | ICD-10-CM | POA: Diagnosis not present

## 2015-04-19 DIAGNOSIS — I10 Essential (primary) hypertension: Secondary | ICD-10-CM | POA: Diagnosis not present

## 2015-04-19 DIAGNOSIS — R05 Cough: Secondary | ICD-10-CM | POA: Diagnosis not present

## 2015-04-19 DIAGNOSIS — K219 Gastro-esophageal reflux disease without esophagitis: Secondary | ICD-10-CM | POA: Diagnosis not present

## 2015-05-21 DIAGNOSIS — I10 Essential (primary) hypertension: Secondary | ICD-10-CM | POA: Diagnosis not present

## 2015-05-21 DIAGNOSIS — Z6834 Body mass index (BMI) 34.0-34.9, adult: Secondary | ICD-10-CM | POA: Diagnosis not present

## 2015-05-21 DIAGNOSIS — R05 Cough: Secondary | ICD-10-CM | POA: Diagnosis not present

## 2015-05-21 DIAGNOSIS — K219 Gastro-esophageal reflux disease without esophagitis: Secondary | ICD-10-CM | POA: Diagnosis not present

## 2015-05-27 ENCOUNTER — Other Ambulatory Visit: Payer: Self-pay | Admitting: Hematology & Oncology

## 2015-05-27 DIAGNOSIS — R922 Inconclusive mammogram: Secondary | ICD-10-CM

## 2015-06-01 DIAGNOSIS — H40033 Anatomical narrow angle, bilateral: Secondary | ICD-10-CM | POA: Diagnosis not present

## 2015-06-01 DIAGNOSIS — H2513 Age-related nuclear cataract, bilateral: Secondary | ICD-10-CM | POA: Diagnosis not present

## 2015-06-18 DIAGNOSIS — R8299 Other abnormal findings in urine: Secondary | ICD-10-CM | POA: Diagnosis not present

## 2015-06-18 DIAGNOSIS — I10 Essential (primary) hypertension: Secondary | ICD-10-CM | POA: Diagnosis not present

## 2015-06-18 DIAGNOSIS — E559 Vitamin D deficiency, unspecified: Secondary | ICD-10-CM | POA: Diagnosis not present

## 2015-06-18 DIAGNOSIS — E784 Other hyperlipidemia: Secondary | ICD-10-CM | POA: Diagnosis not present

## 2015-06-18 DIAGNOSIS — E119 Type 2 diabetes mellitus without complications: Secondary | ICD-10-CM | POA: Diagnosis not present

## 2015-06-18 DIAGNOSIS — N390 Urinary tract infection, site not specified: Secondary | ICD-10-CM | POA: Diagnosis not present

## 2015-06-24 DIAGNOSIS — Z0000 Encounter for general adult medical examination without abnormal findings: Secondary | ICD-10-CM | POA: Diagnosis not present

## 2015-06-24 DIAGNOSIS — E668 Other obesity: Secondary | ICD-10-CM | POA: Diagnosis not present

## 2015-06-24 DIAGNOSIS — Z1389 Encounter for screening for other disorder: Secondary | ICD-10-CM | POA: Diagnosis not present

## 2015-06-24 DIAGNOSIS — I129 Hypertensive chronic kidney disease with stage 1 through stage 4 chronic kidney disease, or unspecified chronic kidney disease: Secondary | ICD-10-CM | POA: Diagnosis not present

## 2015-06-24 DIAGNOSIS — E784 Other hyperlipidemia: Secondary | ICD-10-CM | POA: Diagnosis not present

## 2015-06-24 DIAGNOSIS — R600 Localized edema: Secondary | ICD-10-CM | POA: Diagnosis not present

## 2015-06-24 DIAGNOSIS — I10 Essential (primary) hypertension: Secondary | ICD-10-CM | POA: Diagnosis not present

## 2015-06-24 DIAGNOSIS — E1122 Type 2 diabetes mellitus with diabetic chronic kidney disease: Secondary | ICD-10-CM | POA: Diagnosis not present

## 2015-06-24 DIAGNOSIS — N183 Chronic kidney disease, stage 3 (moderate): Secondary | ICD-10-CM | POA: Diagnosis not present

## 2015-06-24 DIAGNOSIS — C9000 Multiple myeloma not having achieved remission: Secondary | ICD-10-CM | POA: Diagnosis not present

## 2015-06-24 DIAGNOSIS — Z6835 Body mass index (BMI) 35.0-35.9, adult: Secondary | ICD-10-CM | POA: Diagnosis not present

## 2015-06-25 ENCOUNTER — Other Ambulatory Visit: Payer: Self-pay | Admitting: Internal Medicine

## 2015-06-25 DIAGNOSIS — N183 Chronic kidney disease, stage 3 unspecified: Secondary | ICD-10-CM

## 2015-06-28 DIAGNOSIS — Z1212 Encounter for screening for malignant neoplasm of rectum: Secondary | ICD-10-CM | POA: Diagnosis not present

## 2015-07-02 ENCOUNTER — Ambulatory Visit
Admission: RE | Admit: 2015-07-02 | Discharge: 2015-07-02 | Disposition: A | Discharge status: Home / Self Care | Payer: Medicare Other | Source: Ambulatory Visit | Attending: Internal Medicine | Admitting: Internal Medicine

## 2015-07-02 ENCOUNTER — Other Ambulatory Visit: Payer: PRIVATE HEALTH INSURANCE

## 2015-07-02 DIAGNOSIS — N183 Chronic kidney disease, stage 3 unspecified: Secondary | ICD-10-CM

## 2015-07-08 ENCOUNTER — Other Ambulatory Visit: Payer: PRIVATE HEALTH INSURANCE

## 2015-08-05 ENCOUNTER — Other Ambulatory Visit: Payer: Self-pay | Admitting: Hematology & Oncology

## 2015-08-05 DIAGNOSIS — R922 Inconclusive mammogram: Secondary | ICD-10-CM

## 2015-08-08 ENCOUNTER — Encounter (HOSPITAL_COMMUNITY): Payer: Self-pay | Admitting: Emergency Medicine

## 2015-08-08 ENCOUNTER — Emergency Department (HOSPITAL_COMMUNITY)
Admission: EM | Admit: 2015-08-08 | Discharge: 2015-08-08 | Disposition: A | Discharge status: Home / Self Care | Payer: Medicare Other | Attending: Emergency Medicine | Admitting: Emergency Medicine

## 2015-08-08 DIAGNOSIS — H109 Unspecified conjunctivitis: Secondary | ICD-10-CM

## 2015-08-08 DIAGNOSIS — Z86018 Personal history of other benign neoplasm: Secondary | ICD-10-CM | POA: Diagnosis not present

## 2015-08-08 DIAGNOSIS — Y92009 Unspecified place in unspecified non-institutional (private) residence as the place of occurrence of the external cause: Secondary | ICD-10-CM | POA: Diagnosis not present

## 2015-08-08 DIAGNOSIS — Z862 Personal history of diseases of the blood and blood-forming organs and certain disorders involving the immune mechanism: Secondary | ICD-10-CM | POA: Diagnosis not present

## 2015-08-08 DIAGNOSIS — S0501XA Injury of conjunctiva and corneal abrasion without foreign body, right eye, initial encounter: Secondary | ICD-10-CM | POA: Diagnosis not present

## 2015-08-08 DIAGNOSIS — Z8739 Personal history of other diseases of the musculoskeletal system and connective tissue: Secondary | ICD-10-CM | POA: Diagnosis not present

## 2015-08-08 DIAGNOSIS — S0591XA Unspecified injury of right eye and orbit, initial encounter: Secondary | ICD-10-CM | POA: Diagnosis present

## 2015-08-08 DIAGNOSIS — X58XXXA Exposure to other specified factors, initial encounter: Secondary | ICD-10-CM | POA: Insufficient documentation

## 2015-08-08 DIAGNOSIS — I10 Essential (primary) hypertension: Secondary | ICD-10-CM | POA: Insufficient documentation

## 2015-08-08 DIAGNOSIS — E559 Vitamin D deficiency, unspecified: Secondary | ICD-10-CM | POA: Diagnosis not present

## 2015-08-08 DIAGNOSIS — K219 Gastro-esophageal reflux disease without esophagitis: Secondary | ICD-10-CM | POA: Insufficient documentation

## 2015-08-08 DIAGNOSIS — G8929 Other chronic pain: Secondary | ICD-10-CM | POA: Insufficient documentation

## 2015-08-08 DIAGNOSIS — Y998 Other external cause status: Secondary | ICD-10-CM | POA: Diagnosis not present

## 2015-08-08 DIAGNOSIS — Y9389 Activity, other specified: Secondary | ICD-10-CM | POA: Insufficient documentation

## 2015-08-08 DIAGNOSIS — Z86010 Personal history of colonic polyps: Secondary | ICD-10-CM | POA: Insufficient documentation

## 2015-08-08 DIAGNOSIS — Z79899 Other long term (current) drug therapy: Secondary | ICD-10-CM | POA: Insufficient documentation

## 2015-08-08 MED ORDER — GENTAMICIN SULFATE 0.3 % OP SOLN: 2.0000 [drp] | Freq: Four times a day (QID) | OPHTHALMIC | Status: DC

## 2015-08-08 MED ORDER — FLUORESCEIN SODIUM 1 MG OP STRP
1.0000 | ORAL_STRIP | Freq: Once | OPHTHALMIC | Status: DC
  Filled 2015-08-08: qty 1

## 2015-08-08 MED ORDER — TETRACAINE HCL 0.5 % OP SOLN
2.0000 [drp] | Freq: Once | OPHTHALMIC | Status: DC
  Filled 2015-08-08: qty 4

## 2015-08-08 MED ORDER — FLUORESCEIN SODIUM 1 MG OP STRP: 1.0000 | ORAL_STRIP | Freq: Once | OPHTHALMIC | Status: DC

## 2015-08-08 MED ORDER — TETRACAINE HCL 0.5 % OP SOLN: 2.0000 [drp] | Freq: Once | OPHTHALMIC | Status: DC

## 2015-08-08 NOTE — ED Provider Notes (Signed)
CSN: [id number]     Arrival date & time 08/08/15  0048 History  By signing my name below, I, Altamease Oiler, attest that this documentation has been prepared under the direction and in the presence of Veryl Speak, MD. Electronically Signed: Altamease Oiler, ED Scribe. 08/08/2015. 3:55 AM   Chief Complaint  Patient presents with  . Eye Injury    right    The history is provided by the patient. No language interpreter was used.   [name] is a 70 y.o. female who presents to the Emergency Department complaining of constant, 9/10 in severity, right eye pain and swelling with onset 2 days ago after having eyelash extensions applied. Pt describes the discomfort as "scratchy" and throbbing. Associated symptoms include blurred vision in the right eye. She removed the extensions and does not recall scratching the eye.  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    Past Medical History  Diagnosis Date  . Degenerative joint disease involving multiple joints 03/29/2011  . 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 Anemia   . GERD (gastroesophageal reflux disease)   . Vitamin D deficiency   . Hyperlipidemia   . Paget's bone disease   . History of multiple myeloma     chemo XRT, Dr. Marin Olp  . DJD (degenerative joint disease)   . OA (osteoarthritis)   . Renal insufficiency     H/O resolved  . Herpes simplex esophagitis 2006  . HTN (hypertension) 2005  . Chronic pain of left knee   . History of blood transfusion     with first pregnancy  . Myeloma ([place]) 03/28/2011  . Personal history of adenomatous colonic polyps 11/02/2011    2 diminutive serrated adenomas 10/2011 Repeat colonoscopy 2018 (change from original recall based upon current guidelines 12/07/2014)     Past Surgical History  Procedure Laterality Date  . 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 Knee arthroscopy  05/2008    left  . Total knee arthroplasty  10/11/2010    left   Family History  Problem Relation Age of Onset  . Colon cancer Neg Hx   . Lung cancer Father     smoker  . Throat cancer Brother   . Heart attack Mother   . Diabetes Mother   . Diabetes Sister   . Liver cancer Brother   . Pancreatic cancer Sister   . Heart attack Mother   . Rheum arthritis Sister    Social History  Substance Use Topics  . Smoking status: Never Smoker   . Smokeless tobacco: Never Used     Comment: never used tobacco  . Alcohol Use: No   OB History    Gravida Para Term Preterm AB TAB SAB Ectopic Multiple Living   _0 Review of Systems  10 Systems reviewed and all are negative for acute change except as noted in the HPI.  Allergies  Review of patient's allergies indicates no known allergies.  Home Medications   Prior to Admission medications   Medication Sig Start Date End Date Taking? Authorizing Provider  dexlansoprazole (DEXILANT) 60 MG capsule Take 60 mg by mouth daily.   Yes Historical Provider, MD  diltiazem (CARDIZEM CD) 240 MG  24 hr capsule Take 1 capsule (240 mg total) by mouth daily. 10/27/12  Yes Gail Schulz, NP  hydrochlorothiazide (HYDRODIURIL) 25 MG tablet Take 25 mg by mouth daily.   Yes Historical Provider, MD  metFORMIN (GLUCOPHAGE) 500 MG tablet Take 500 mg by mouth 2 (two) times daily with a meal.  08/29/14  Yes Historical Provider, MD  Vitamin D, Ergocalciferol, (DRISDOL) 50000 units CAPS capsule Take 50,000 Units by mouth every 7 (seven) days.   Yes Historical Provider, MD  Zoledronic Acid (ZOMETA IV) Inject into the vein. Due now   Yes Historical Provider, MD   BP 141/89 mmHg  Pulse 102  Temp(Src) 98.2 F (36.8 C) (Oral)  Resp 16  Ht 5' 2" (1.575 m)  Wt 194 lb (87.998 kg)  BMI 35.47 kg/m2  SpO2 97%  LMP 05/22/2000 Physical Exam  Constitutional: She is oriented to person, place, and  time. She appears well-developed and well-nourished.  HENT:  Head: Normocephalic.  Eyes: EOM are normal.  The right conjunctiva is injected. There are no obvious scratches or foreign bodies. With fluorescein staining, there is a small area of uptake noted centrally.   Neck: Normal range of motion.  Pulmonary/Chest: Effort normal.  Abdominal: She exhibits no distension.  Musculoskeletal: Normal range of motion.  Neurological: She is alert and oriented to person, place, and time.  Psychiatric: She has a normal mood and affect.  Nursing note and vitals reviewed.   ED Course  Procedures (including critical care time) DIAGNOSTIC STUDIES: Oxygen Saturation is 97% on RA,  normal by my interpretation.    COORDINATION OF CARE: 3:50 AM Discussed treatment plan which includes abx eye drops with pt at bedside and pt agreed to plan.  Labs Review Labs Reviewed - No data to display  Imaging Review No results found.   MDM   Final diagnoses:  Conjunctivitis of right eye  Corneal abrasion, right, initial encounter    Fluorescein staining reveals a small centrally located corneal abrasion. She also has injected conjunctiva, however no foreign body. She will be treated with gentamicin drops and when necessary return if not improving.  I personally performed the services described in this documentation, which was scribed in my presence. The recorded information has been reviewed and is accurate.       , MD 08/09/15 0640 

## 2015-08-08 NOTE — ED Notes (Signed)
Pt from home with c/o of eye swelling and pain in her right eye. This RN does not note much swelling to the area, but pt's eye is watering. Pt states it feels "scratchy" when she blinks, but there is no obvious injury.

## 2015-08-08 NOTE — Discharge Instructions (Signed)
Gentamicin drops as prescribed.  Ibuprofen 600 mg every 6 hours as needed for pain.  Return to the emergency department if your symptoms are not improving in the next 2 days, or if they significantly worsen in the meantime.   Corneal Abrasion The cornea is the clear covering at the front and center of the eye. When looking at the colored portion of the eye (iris), you are looking through the cornea. This very thin tissue is made up of many layers. The surface layer is a single layer of cells (corneal epithelium) and is one of the most sensitive tissues in the body. If a scratch or injury causes the corneal epithelium to come off, it is called a corneal abrasion. If the injury extends to the tissues below the epithelium, the condition is called a corneal ulcer. CAUSES   Scratches.  Trauma.  Foreign body in the eye. Some people have recurrences of abrasions in the area of the original injury even after it has healed (recurrent erosion syndrome). Recurrent erosion syndrome generally improves and goes away with time. SYMPTOMS   Eye pain.  Difficulty or inability to keep the injured eye open.  The eye becomes very sensitive to light.  Recurrent erosions tend to happen suddenly, first thing in the morning, usually after waking up and opening the eye. DIAGNOSIS  Your health care provider can diagnose a corneal abrasion during an eye exam. Dye is usually placed in the eye using a drop or a small paper strip moistened by your tears. When the eye is examined with a special light, the abrasion shows up clearly because of the dye. TREATMENT   Small abrasions may be treated with antibiotic drops or ointment alone.  A pressure patch may be put over the eye. If this is done, follow your doctor's instructions for when to remove the patch. Do not drive or use machines while the eye patch is on. Judging distances is hard to do with a patch on. If the abrasion becomes infected and spreads to the deeper  tissues of the cornea, a corneal ulcer can result. This is serious because it can cause corneal scarring. Corneal scars interfere with light passing through the cornea and cause a loss of vision in the involved eye. HOME CARE INSTRUCTIONS  Use medicine or ointment as directed. Only take over-the-counter or prescription medicines for pain, discomfort, or fever as directed by your health care provider.  Do not drive or operate machinery if your eye is patched. Your ability to judge distances is impaired.  If your health care provider has given you a follow-up appointment, it is very important to keep that appointment. Not keeping the appointment could result in a severe eye infection or permanent loss of vision. If there is any problem keeping the appointment, let your health care provider know. SEEK MEDICAL CARE IF:   You have pain, light sensitivity, and a scratchy feeling in one eye or both eyes.  Your pressure patch keeps loosening up, and you can blink your eye under the patch after treatment.  Any kind of discharge develops from the eye after treatment or if the lids stick together in the morning.  You have the same symptoms in the morning as you did with the original abrasion days, weeks, or months after the abrasion healed.   This information is not intended to replace advice given to you by your health care provider. Make sure you discuss any questions you have with your health care provider.  Document Released: 05/05/2000 Document Revised: 01/27/2015 Document Reviewed: 01/13/2013 °Elsevier Interactive Patient Education ©2016 [employer] ° °

## 2015-08-12 ENCOUNTER — Ambulatory Visit
Admission: RE | Admit: 2015-08-12 | Discharge: 2015-08-12 | Disposition: A | Discharge status: Home / Self Care | Payer: Medicare Other | Source: Ambulatory Visit | Attending: Hematology & Oncology | Admitting: Hematology & Oncology

## 2015-08-12 ENCOUNTER — Other Ambulatory Visit: Payer: Self-pay | Admitting: Hematology & Oncology

## 2015-08-12 DIAGNOSIS — R922 Inconclusive mammogram: Secondary | ICD-10-CM

## 2015-08-12 DIAGNOSIS — N63 Unspecified lump in breast: Secondary | ICD-10-CM | POA: Diagnosis not present

## 2015-08-12 DIAGNOSIS — N6310 Unspecified lump in the right breast, unspecified quadrant: Secondary | ICD-10-CM

## 2015-08-12 DIAGNOSIS — N6489 Other specified disorders of breast: Secondary | ICD-10-CM | POA: Diagnosis not present

## 2015-08-20 ENCOUNTER — Ambulatory Visit
Admission: RE | Admit: 2015-08-20 | Discharge: 2015-08-20 | Disposition: A | Discharge status: Home / Self Care | Payer: Medicare Other | Source: Ambulatory Visit | Attending: Hematology & Oncology | Admitting: Hematology & Oncology

## 2015-08-20 ENCOUNTER — Other Ambulatory Visit: Payer: Self-pay | Admitting: Hematology & Oncology

## 2015-08-20 DIAGNOSIS — N6310 Unspecified lump in the right breast, unspecified quadrant: Secondary | ICD-10-CM

## 2015-08-20 DIAGNOSIS — C50211 Malignant neoplasm of upper-inner quadrant of right female breast: Secondary | ICD-10-CM | POA: Diagnosis not present

## 2015-08-20 DIAGNOSIS — D0511 Intraductal carcinoma in situ of right breast: Secondary | ICD-10-CM | POA: Diagnosis not present

## 2015-08-20 DIAGNOSIS — N63 Unspecified lump in breast: Secondary | ICD-10-CM | POA: Diagnosis not present

## 2015-08-25 ENCOUNTER — Other Ambulatory Visit: Payer: Self-pay | Admitting: Family

## 2015-08-25 DIAGNOSIS — C50211 Malignant neoplasm of upper-inner quadrant of right female breast: Secondary | ICD-10-CM

## 2015-08-30 ENCOUNTER — Ambulatory Visit
Admission: RE | Admit: 2015-08-30 | Discharge: 2015-08-30 | Disposition: A | Discharge status: Home / Self Care | Payer: Medicare Other | Source: Ambulatory Visit | Attending: Family | Admitting: Family

## 2015-08-30 DIAGNOSIS — C50211 Malignant neoplasm of upper-inner quadrant of right female breast: Secondary | ICD-10-CM

## 2015-08-30 DIAGNOSIS — N6489 Other specified disorders of breast: Secondary | ICD-10-CM | POA: Diagnosis not present

## 2015-08-30 MED ORDER — GADOBENATE DIMEGLUMINE 529 MG/ML IV SOLN
8.0000 mL | Freq: Once | INTRAVENOUS | Status: AC | PRN
  Administered 2015-08-30: 8 mL via INTRAVENOUS

## 2015-08-31 ENCOUNTER — Other Ambulatory Visit: Payer: Self-pay | Admitting: General Surgery

## 2015-08-31 DIAGNOSIS — C50211 Malignant neoplasm of upper-inner quadrant of right female breast: Secondary | ICD-10-CM

## 2015-09-01 ENCOUNTER — Telehealth: Payer: Self-pay | Admitting: Hematology & Oncology

## 2015-09-01 NOTE — Telephone Encounter (Signed)
New patient referral re-routed to [facility] - patient already established with PE. See message below sent to PE and copied to Isaac Bliss, [place] and also Shavon at [employer].   Hi Dr. Marin Olp,   We received a new patient referral for [name] from [facility] today. She was seen by Dr. Excell Seltzer and has a new breast cancer dx. She is already established with you and is currently on schedule at [place] for lab/fu/tx next week.   The path report is in EPIC and we re-routed the referral to your [facility]. Will you continue to see [name]?   Please let me know if I need to do anything else concerning this referral.   Thanks,  Lenna Sciara

## 2015-09-02 ENCOUNTER — Encounter: Payer: Self-pay | Admitting: Radiation Oncology

## 2015-09-02 ENCOUNTER — Other Ambulatory Visit: Payer: Self-pay | Admitting: General Surgery

## 2015-09-02 DIAGNOSIS — C50211 Malignant neoplasm of upper-inner quadrant of right female breast: Secondary | ICD-10-CM

## 2015-09-03 NOTE — Telephone Encounter (Signed)
Per response from  PE and Nikki P patient will continue care at our [facility] office.

## 2015-09-07 ENCOUNTER — Encounter: Payer: Self-pay | Admitting: Radiation Oncology

## 2015-09-07 ENCOUNTER — Ambulatory Visit: Payer: Medicare Other

## 2015-09-07 ENCOUNTER — Ambulatory Visit: Payer: Medicare Other | Admitting: Hematology & Oncology

## 2015-09-07 ENCOUNTER — Other Ambulatory Visit: Payer: Medicare Other

## 2015-09-09 ENCOUNTER — Other Ambulatory Visit (HOSPITAL_BASED_OUTPATIENT_CLINIC_OR_DEPARTMENT_OTHER): Payer: Medicare Other

## 2015-09-09 ENCOUNTER — Ambulatory Visit (HOSPITAL_BASED_OUTPATIENT_CLINIC_OR_DEPARTMENT_OTHER): Payer: Medicare Other

## 2015-09-09 ENCOUNTER — Ambulatory Visit (HOSPITAL_BASED_OUTPATIENT_CLINIC_OR_DEPARTMENT_OTHER): Payer: Medicare Other | Admitting: Hematology & Oncology

## 2015-09-09 ENCOUNTER — Encounter: Payer: Self-pay | Admitting: Hematology & Oncology

## 2015-09-09 VITALS — BP 151/70 | HR 77 | Temp 97.6°F | Resp 18 | Wt 190.0 lb

## 2015-09-09 DIAGNOSIS — C50911 Malignant neoplasm of unspecified site of right female breast: Secondary | ICD-10-CM

## 2015-09-09 DIAGNOSIS — C9001 Multiple myeloma in remission: Secondary | ICD-10-CM

## 2015-09-09 DIAGNOSIS — T386X5A Adverse effect of antigonadotrophins, antiestrogens, antiandrogens, not elsewhere classified, initial encounter: Secondary | ICD-10-CM

## 2015-09-09 DIAGNOSIS — C50919 Malignant neoplasm of unspecified site of unspecified female breast: Secondary | ICD-10-CM

## 2015-09-09 DIAGNOSIS — Z170 Estrogen receptor positive status [ER+]: Principal | ICD-10-CM

## 2015-09-09 DIAGNOSIS — M818 Other osteoporosis without current pathological fracture: Secondary | ICD-10-CM

## 2015-09-09 HISTORY — DX: Malignant neoplasm of unspecified site of unspecified female breast: C50.919

## 2015-09-09 LAB — CMP (CANCER CENTER ONLY)
ALK PHOS: 86 U/L — AB (ref 26–84)
ALT: 38 U/L (ref 10–47)
AST: 33 U/L (ref 11–38)
Albumin: 3.7 g/dL (ref 3.3–5.5)
BUN: 18 mg/dL (ref 7–22)
CALCIUM: 9.7 mg/dL (ref 8.0–10.3)
CO2: 29 meq/L (ref 18–33)
Chloride: 102 mEq/L (ref 98–108)
Creat: 1.4 mg/dl — ABNORMAL HIGH (ref 0.6–1.2)
GLUCOSE: 92 mg/dL (ref 73–118)
POTASSIUM: 3.7 meq/L (ref 3.3–4.7)
Sodium: 142 mEq/L (ref 128–145)
Total Bilirubin: 0.6 mg/dl (ref 0.20–1.60)
Total Protein: 8.2 g/dL — ABNORMAL HIGH (ref 6.4–8.1)

## 2015-09-09 LAB — CBC WITH DIFFERENTIAL (CANCER CENTER ONLY)
BASO#: 0 10*3/uL (ref 0.0–0.2)
BASO%: 0.8 % (ref 0.0–2.0)
EOS ABS: 0.1 10*3/uL (ref 0.0–0.5)
EOS%: 2.9 % (ref 0.0–7.0)
HEMATOCRIT: 38.3 % (ref 34.8–46.6)
HEMOGLOBIN: 12.7 g/dL (ref 11.6–15.9)
LYMPH#: 1.8 10*3/uL (ref 0.9–3.3)
LYMPH%: 37.3 % (ref 14.0–48.0)
MCH: 30.8 pg (ref 26.0–34.0)
MCHC: 33.2 g/dL (ref 32.0–36.0)
MCV: 93 fL (ref 81–101)
MONO#: 0.5 10*3/uL (ref 0.1–0.9)
MONO%: 9.8 % (ref 0.0–13.0)
NEUT%: 49.2 % (ref 39.6–80.0)
NEUTROS ABS: 2.4 10*3/uL (ref 1.5–6.5)
Platelets: 318 10*3/uL (ref 145–400)
RBC: 4.12 10*6/uL (ref 3.70–5.32)
RDW: 12.7 % (ref 11.1–15.7)
WBC: 4.9 10*3/uL (ref 3.9–10.0)

## 2015-09-09 MED ORDER — ZOLEDRONIC ACID 4 MG/100ML IV SOLN
4.0000 mg | Freq: Once | INTRAVENOUS | Status: AC
  Administered 2015-09-09: 4 mg via INTRAVENOUS
  Filled 2015-09-09: qty 100

## 2015-09-09 NOTE — Patient Instructions (Signed)

## 2015-09-09 NOTE — Progress Notes (Signed)
Hematology and Oncology Follow Up Visit  [facility] [id number] 26-Jul-1945 70 y.o. 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 Shockingly enough, so she now has an invasive ductal carcinoma of the right breast. She was not have some abnormal constipation on a mammogram. She ultimately underwent breast MRI. This showed an area in the right breast that has some slight changes.  She did undergo a biopsy. This was on March 31. The pathology report ([id number]) showed an invasive ductal carcinoma. There is ductal carcinoma in situ also. Unfortunately, they cannot get enough material for estrogen and HER-2 status.  She has or is seeing the surgeon. He will operate on her on May 3.  I think she also has seen radiation oncology.  She did not note any change in the right breast. She just had a routine mammogram done.  She is not on Revlimid. I know that with Revlimid, myeloma patients can have a higher risk of second malignancies. She does not take any estrogens. She had her first child when she was in her early 46s.  She still has a very strong faith. She is not worried at all regarding the breast cancer.  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 She's done very well with the myeloma and my mind is cured. Her last myeloma studies did not show a monoclonal spike.  Her IgG level was 1440 mg/dL. Her Kappa Light chain was 3.35 mg/dL.  She had a very good Easter. She and her boyfriend were [place].  He did very well. They sold a lot of product.  She's had no fever. She's had no shortness of breath. She's had no change in bowel or bladder habits.  She is still working. 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   Overall, her performance status is ECOG  0.   Medications:  Current outpatient prescriptions:  .  dexlansoprazole (DEXILANT) 60 MG capsule, Take 60 mg by mouth daily., Disp: , Rfl:  .  diltiazem (CARDIZEM CD) 240 MG 24 hr capsule, Take 1 capsule (240 mg total) by mouth daily., Disp: 30 capsule, Rfl: 0 .  gentamicin (GARAMYCIN) 0.3 % ophthalmic solution, Place 2 drops into the right eye 4 (four) times daily., Disp: 5 mL, Rfl: 0 .  hydrochlorothiazide (HYDRODIURIL) 25 MG tablet, Take 25 mg by mouth daily., Disp: , Rfl:  .  metFORMIN (GLUCOPHAGE) 500 MG tablet, Take 500 mg by mouth 2 (two) times daily with a meal. , Disp: , Rfl: 1 .  Vitamin D, Ergocalciferol, (DRISDOL) 50000 units CAPS capsule, Take 50,000 Units by mouth every 7 (seven) days., Disp: , Rfl:  .  Zoledronic Acid (ZOMETA IV), Inject into the vein. Due now, Disp: , Rfl:   Allergies: No Known Allergies  Past Medical History, Surgical history, Social history, and Family History were reviewed and updated.  Review of Systems: As above  Physical Exam:  weight is 190 lb (86.183 kg). Her oral temperature is 97.6 F (36.4 C). Her blood pressure is 151/70 and her pulse is 77. Her respiration is 18.   Well-developed and well-nourished Afro-American female. She has no ocular or oral lesion. There is no adenopathy in the neck. Lungs are clear bilaterally.  Cardiac exam regular rate and rhythm with a normal S1 and S2. There are no murmurs rubs or bruits.  breast exam shows left breast no mass masses, edema or erythema. There is no left axillary adenopathy. Right breast shows the biopsy site at about the 1:00 position. There is some slight tenderness at the biopsy site. No distinct masses noted. She has no right axillary adenopathy.  Abdomen is soft. She has good bowel sounds. There is no palpable abdominal mass. There is no palpable hepatosplenomegaly. Back exam no tenderness over the spine ribs or hips. Extremities shows no clubbing cyanosis or edema. Neurological exam shows no focal  neurological deficits.  Lab Results  Component Value Date   WBC 4.9 09/09/2015   HGB 12.7 09/09/2015   HCT 38.3 09/09/2015   MCV 93 09/09/2015   PLT 318 09/09/2015     Chemistry      Component Value Date/Time   NA 142 09/09/2015 0933   NA 142 10/27/2012 0143   K 3.7 09/09/2015 0933   K 4.0 10/27/2012 0143   CL 102 09/09/2015 0933   CL 112 10/27/2012 0143   CO2 29 09/09/2015 0933   CO2 26 04/03/2012 1109   BUN 18 09/09/2015 0933   BUN 15 10/27/2012 0143   CREATININE 1.4* 09/09/2015 0933   CREATININE 1.60* 10/27/2012 0143      Component Value Date/Time   CALCIUM 9.7 09/09/2015 0933   CALCIUM 9.6 04/03/2012 1109   ALKPHOS 86* 09/09/2015 0933   ALKPHOS 101 04/03/2012 1109   AST 33 09/09/2015 0933   AST 14 04/03/2012 1109   ALT 38 09/09/2015 0933   ALT 13 04/03/2012 1109   BILITOT 0.60 09/09/2015 0933   BILITOT 0.4 04/03/2012 1109         Impression and Plan: [name] is 70 year old Afro-American female. She has a history of IgG kappa myeloma. 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 So far, there's been no evidence of recurrence of her myeloma.  Her problem now is breast cancer. Again, I have to believe that this is going to be an early-stage breast cancer. I would think that this will be stage I.  I would be incredibly surprised if we need any chemotherapy. We will have to see what the prognostic markers are. I guess we will only get this when she has her surgery.  Since she is going have lumpectomy, she will need radiation therapy. I think she is alert is seeing them.  If she is ER positive, we will consider sending off an Oncotype assay on her.  I spent about 45 minutes with her. This is a brand-new problem for her. I want to make sure that we have everybody in place and have all the information that we need to make the right decision for treatment after surgery.  We will continue her Zometa but plan to treat her every 6 months now. I think she really go  every 6 months.  I will plan to see her back about a month after her surgery.   [name], MD 4/20/201711:20 AM

## 2015-09-10 LAB — KAPPA/LAMBDA LIGHT CHAINS
IG KAPPA FREE LIGHT CHAIN: 45.66 mg/L — AB (ref 3.30–19.40)
IG LAMBDA FREE LIGHT CHAIN: 24.7 mg/L (ref 5.71–26.30)
KAPPA/LAMBDA FLC RATIO: 1.85 — AB (ref 0.26–1.65)

## 2015-09-10 LAB — IGG, IGA, IGM
IGA/IMMUNOGLOBULIN A, SERUM: 367 mg/dL — AB (ref 87–352)
IgG, Qn, Serum: 1375 mg/dL (ref 700–1600)
IgM, Qn, Serum: 119 mg/dL (ref 26–217)

## 2015-09-13 LAB — PROTEIN ELECTROPHORESIS, SERUM, WITH REFLEX
A/G Ratio: 0.9 (ref 0.7–1.7)
ALBUMIN: 3.6 g/dL (ref 2.9–4.4)
ALPHA 1: 0.2 g/dL (ref 0.0–0.4)
ALPHA 2: 0.9 g/dL (ref 0.4–1.0)
Beta: 1.3 g/dL (ref 0.7–1.3)
Gamma Globulin: 1.3 g/dL (ref 0.4–1.8)
Globulin, Total: 3.8 g/dL (ref 2.2–3.9)
Total Protein: 7.4 g/dL (ref 6.0–8.5)

## 2015-09-14 ENCOUNTER — Ambulatory Visit: Payer: Medicare Other | Admitting: Physical Therapy

## 2015-09-15 ENCOUNTER — Encounter (HOSPITAL_BASED_OUTPATIENT_CLINIC_OR_DEPARTMENT_OTHER): Payer: Self-pay | Admitting: *Deleted

## 2015-09-15 ENCOUNTER — Encounter: Payer: Self-pay | Admitting: Physical Therapy

## 2015-09-15 ENCOUNTER — Ambulatory Visit: Payer: Medicare Other | Attending: General Surgery | Admitting: Physical Therapy

## 2015-09-15 DIAGNOSIS — M25611 Stiffness of right shoulder, not elsewhere classified: Secondary | ICD-10-CM | POA: Diagnosis not present

## 2015-09-15 DIAGNOSIS — M25612 Stiffness of left shoulder, not elsewhere classified: Secondary | ICD-10-CM | POA: Diagnosis not present

## 2015-09-15 NOTE — Therapy (Signed)
[place], [place], [address] Phone: [phone]   Fax:  [phone]  Physical Therapy Evaluation  Patient Details  Name: [name] MRN: [id number] Date of Birth: [birth year] Referring Provider: Excell Seltzer  Encounter Date: 09/15/2015      PT End of Session - 09/15/15 1221    Visit Number 1   Number of Visits 1   PT Start Time 7062   PT Stop Time 1105   PT Time Calculation (min) 47 min   Activity Tolerance Patient tolerated treatment well   Behavior During Therapy [facility] for tasks assessed/performed      Past Medical History  Diagnosis Date  . Degenerative joint disease involving multiple joints 03/29/2011  . 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 Anemia   . GERD (gastroesophageal reflux disease)   . Vitamin D deficiency   . Hyperlipidemia   . Paget's bone disease   . History of multiple myeloma     chemo XRT, Dr. Marin Olp  . DJD (degenerative joint disease)   . OA (osteoarthritis)   . Renal insufficiency     H/O resolved  . Herpes simplex esophagitis 2006  . HTN (hypertension) 2005  . Chronic pain of left knee   . History of blood transfusion     with first pregnancy  . Myeloma ([place]) 03/28/2011  . Personal history of adenomatous colonic polyps 11/02/2011    2 diminutive serrated adenomas 10/2011 Repeat colonoscopy 2018 (change from original recall based upon current guidelines 12/07/2014)    . Breast cancer, stage 1 ([place]) 09/09/2015    Past Surgical History  Procedure Laterality Date  . 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 Knee arthroscopy  05/2008    left  . Total knee arthroplasty  10/11/2010    left  . Joint replacement Left     There were no vitals filed for this visit.       Subjective Assessment - 09/15/15 1025    Subjective I am having a lumpectomy on May 3rd and they are putting a seed in  there on May 2nd because it's so small they can't see it.    Pertinent History Underwent left breast biopsy March 31. The pathology report ([id number]) showed an invasive ductal carcinoma. There is ductal carcinoma in situ also. Unfortunately, they cannot get enough material for estrogen and HER-2 status. Pt to undergo lumpectomy left breast on May 3rd, 2017   Patient Stated Goals none   Currently in Pain? No/denies   Pain Score 0-No pain            [facility] PT Assessment - 09/15/15 0001    Assessment   Medical Diagnosis right breast cancer   Referring Provider [place]   Onset Date/Surgical Date 09/22/15   Hand Dominance Right   Prior Therapy for knee following knee replacement 4 years ago   Precautions   Precautions Other (comment)  at risk for lymphedema   Restrictions   Weight Bearing Restrictions No   Balance Screen   Has the patient fallen in the past 6 months No   Has the patient had a decrease in activity level because of a fear of falling?  No   Is the patient reluctant to leave their home because of a fear of falling?  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   Leisure pt does squats, TKE, hamstring stretch 2x/wk for 15 min    Cognition   Overall Cognitive Status Within Functional Limits for tasks assessed   AROM   Right Shoulder Flexion 153 Degrees   Right Shoulder ABduction 148 Degrees   Right Shoulder Internal Rotation 23 Degrees   Right Shoulder External Rotation 87 Degrees   Left Shoulder Flexion 156 Degrees   Left Shoulder ABduction 160 Degrees   Left Shoulder Internal Rotation 33 Degrees   Left Shoulder External  Rotation 75 Degrees           LYMPHEDEMA/ONCOLOGY QUESTIONNAIRE - 09/15/15 1042    Right Upper Extremity Lymphedema   15 cm Proximal to Olecranon Process 36.7 cm   Olecranon Process 26.7 cm   15 cm Proximal to Ulnar Styloid Process 26.5 cm   Just Proximal to Ulnar Styloid Process 15.5 cm   Across Hand at [employer] 19.6 cm   At [place] of 2nd Digit 6.5 cm   Left Upper Extremity Lymphedema   15 cm Proximal to Olecranon Process 36.7 cm   Olecranon Process 27.5 cm   15 cm Proximal to Ulnar Styloid Process 27.4 cm   Just Proximal to Ulnar Styloid Process 15.6 cm   Across Hand at [employer] 19.5 cm   At [place] of 2nd Digit 6.4 cm                        PT Education - 09/15/15 1220    Education provided Yes   Education Details breast [place] clinic handouts   Person(s) Educated Patient;Other (comment)  boyfriend   Methods Explanation;Demonstration;Handout   Comprehension Verbalized understanding              Breast Clinic Goals - 09/15/15 1218    Patient will be able to verbalize understanding of pertinent lymphedema risk reduction practices relevant to her diagnosis specifically related to skin care.   Status Achieved   Patient will be able to return demonstrate and/or verbalize understanding of the post-op home exercise program related to regaining shoulder range of motion.   Status Achieved   Patient will be able to verbalize understanding of the importance of attending the postoperative After Breast Cancer Class for further lymphedema risk reduction education and therapeutic exercise.   Status Achieved              Plan - 09/15/15 1217    Clinical Impression Statement Pt presents to clinic prior to left lumpectomy on May 3rd. She is unsure if she will need chemotherapy or radiation at this time. Pt was educated about lymphedema risk precautions, ABC class, and a home exercise program for range of motion once pt is released by MD to perform. Pt  also given information regarding her diagnosis and what to expect. She was educated to come back to therapy if she notices any edema in her arm or breast or has difficulty with ROM/strength.    Rehab Potential Good   PT Frequency One time visit   PT Treatment/Interventions ADLs/Self Care Home Management;Patient/family education   PT Home Exercise Plan [facility] exercises   Consulted and  Agree with Plan of Care Patient      Patient will benefit from skilled therapeutic intervention in order to improve the following deficits and impairments:  Decreased knowledge of precautions  Visit Diagnosis: Stiffness of right shoulder, not elsewhere classified - Plan: PT plan of care cert/re-cert  Stiffness of left shoulder, not elsewhere classified - Plan: PT plan of care cert/re-cert     Problem List Patient Active Problem List   Diagnosis Date Noted  . Breast cancer, stage 1 ([place]) 09/09/2015  . Encounter for CDL (commercial driving license) exam 38/36/5427  . Depression 04/11/2012  . Personal history of adenomatous colonic polyps 11/02/2011  . Degenerative joint disease involving multiple joints 03/29/2011  . Myeloma ([place]) 03/28/2011    Alexia Freestone 09/15/2015, 12:29 PM  [place], [place], [address] Phone: [phone]   Fax:  [phone]  Name: [name] MRN: [id number] Date of Birth: January 07, 1946   Allyson Sabal, PT 09/15/2015 12:29 PM

## 2015-09-17 ENCOUNTER — Other Ambulatory Visit: Payer: Self-pay

## 2015-09-17 ENCOUNTER — Encounter (HOSPITAL_BASED_OUTPATIENT_CLINIC_OR_DEPARTMENT_OTHER)
Admission: RE | Admit: 2015-09-17 | Discharge: 2015-09-17 | Disposition: A | Discharge status: Home / Self Care | Payer: Medicare Other | Source: Ambulatory Visit | Attending: General Surgery | Admitting: General Surgery

## 2015-09-17 DIAGNOSIS — E119 Type 2 diabetes mellitus without complications: Secondary | ICD-10-CM | POA: Insufficient documentation

## 2015-09-17 DIAGNOSIS — Z01810 Encounter for preprocedural cardiovascular examination: Secondary | ICD-10-CM | POA: Insufficient documentation

## 2015-09-17 DIAGNOSIS — M25612 Stiffness of left shoulder, not elsewhere classified: Secondary | ICD-10-CM | POA: Diagnosis not present

## 2015-09-17 DIAGNOSIS — I10 Essential (primary) hypertension: Secondary | ICD-10-CM | POA: Insufficient documentation

## 2015-09-17 DIAGNOSIS — C50911 Malignant neoplasm of unspecified site of right female breast: Secondary | ICD-10-CM | POA: Insufficient documentation

## 2015-09-17 DIAGNOSIS — M25611 Stiffness of right shoulder, not elsewhere classified: Secondary | ICD-10-CM | POA: Diagnosis not present

## 2015-09-21 ENCOUNTER — Ambulatory Visit
Admission: RE | Admit: 2015-09-21 | Discharge: 2015-09-21 | Disposition: A | Discharge status: Home / Self Care | Payer: Medicare Other | Source: Ambulatory Visit | Attending: General Surgery | Admitting: General Surgery

## 2015-09-21 DIAGNOSIS — C50211 Malignant neoplasm of upper-inner quadrant of right female breast: Secondary | ICD-10-CM

## 2015-09-21 DIAGNOSIS — C50911 Malignant neoplasm of unspecified site of right female breast: Secondary | ICD-10-CM | POA: Diagnosis not present

## 2015-09-21 NOTE — H&P (Signed)
History of Present Illness Erin Kitchen T. [name] Termini MD; 08/31/2015 3:13 PM) Patient words: New breast cancer consultation.  The patient is a 70 year old female who presents with breast cancer. Patint is a post menopausal female referred by [name] for evaluation of recently diagnosed carcinoma of the right breast. She recently presented for a screening mamogram revealing a new small mass in the upper inner posterior portion of the right breast.. Subsequent imaging included diagnostic mamogram showing a 3-4 mm spiculated mass without calcifications. And ultrasound showing only normal fibroglandular tissue. A benign-appearing mass in the left breast is also seen that has been stable for over 2 years. A stereotactic guided breast biopsy was performed on August 20, 2015 with pathology revealing invasive ductal carcinoma of the breast. She is seen now in the office for initial treatment planning. She has experienced no breast symptoms, specifically lump or pain or nipple discharge or skin changes.. She does not have a personal history of any previous breast problems. She has a history of multiple myeloma diagnosed 10 years ago in remission. Denies family history of breast cancer  Findings at that time were the following: Tumor size: 0.4 cm Tumor grade: 2 Estrogen Receptor: Insufficient tissue Progesterone Receptor: Insufficient tissue Her-2 neu: Insufficient tissue Lymph node status: Neg    Other Problems [name], [name]; 08/31/2015 2:38 PM) Back Pain Cancer Gastroesophageal Reflux Disease High blood pressure Hypercholesterolemia  Past Surgical History [name], [name]; 08/31/2015 2:38 PM) Knee Surgery Left.  Diagnostic Studies History [name], Black [name]; 08/31/2015 2:38 PM) Colonoscopy 1-5 years ago Mammogram within last year Pap Smear 1-5 years ago  Allergies [name], Bald [name]; 08/31/2015 2:39 PM) No Known Drug Allergies04/03/2016  Medication History  [name], [name]; 08/31/2015 2:40 PM) Dexilant (60MG Capsule DR, Oral daily) Active. DiltiaZEM CD (240MG Capsule ER 24HR, Oral daily) Active. HydroCHLOROthiazide (25MG Tablet, Oral daily) Active. MetFORMIN HCl (500MG Tablet, Oral two times daily) Active. Vitamin D (Ergocalciferol) (50000UNIT Capsule, Oral once a week) Active. Medications Reconciled  Pregnancy / Birth History [name], [name]; 08/31/2015 2:38 PM) Age at menarche 60 years. Age of menopause <47 Gravida 3 Irregular periods Maternal age 94-20 Para 3    Review of Systems [name]; 08/31/2015 2:38 PM) General Not Present- Appetite Loss, Chills, Fatigue, Fever, Night Sweats, Weight Gain and Weight Loss. Skin Not Present- Change in Wart/Mole, Dryness, Hives, Jaundice, New Lesions, Non-Healing Wounds, Rash and Ulcer. HEENT Not Present- Earache, Hearing Loss, Hoarseness, Nose Bleed, Oral Ulcers, Ringing in the Ears, Seasonal Allergies, Sinus Pain, Sore Throat, Visual Disturbances, Wears glasses/contact lenses and Yellow Eyes. Respiratory Not Present- Bloody sputum, Chronic Cough, Difficulty Breathing, Snoring and Wheezing. Breast Not Present- Breast Mass, Breast Pain, Nipple Discharge and Skin Changes. Cardiovascular Not Present- Chest Pain, Difficulty Breathing Lying Down, Leg Cramps, Palpitations, Rapid Heart Rate, Shortness of Breath and Swelling of Extremities. Gastrointestinal Present- Indigestion. Not Present- Abdominal Pain, Bloating, Bloody Stool, Change in Bowel Habits, Chronic diarrhea, Constipation, Difficulty Swallowing, Excessive gas, Gets full quickly at meals, Hemorrhoids, Nausea, Rectal Pain and Vomiting. Female Genitourinary Not Present- Frequency, Nocturia, Painful Urination, Pelvic Pain and Urgency. Musculoskeletal Present- Joint Pain and Joint Stiffness. Not Present- Back Pain, Muscle Pain, Muscle Weakness and Swelling of Extremities. Neurological Present- Tingling. Not Present- Decreased  Memory, Fainting, Headaches, Numbness, Seizures, Tremor, Trouble walking and Weakness. Psychiatric Not Present- Anxiety, Bipolar, Change in Sleep Pattern, Depression, Fearful and Frequent crying. Endocrine Not Present- Cold Intolerance, Excessive Hunger, Hair Changes, Heat Intolerance, Hot flashes and New Diabetes.  Hematology Not Present- Easy Bruising, Excessive bleeding, Gland problems, HIV and Persistent Infections.  Vitals [name]; 08/31/2015 2:41 PM) 08/31/2015 2:40 PM Weight: 185.6 lb Height: 62in Body Surface Area: 1.85 m Body Mass Index: 33.95 kg/m  Temp.: 97.70F  Pulse: 67 (Regular)  BP: 132/80 (Sitting, Right Arm, Standard)       Physical Exam Erin Kitchen T. Candies Palm MD; 08/31/2015 3:15 PM) The physical exam findings are as follows: Note:General: Alert, well-developed and well nourished African-American female, in no distress Skin: Warm and dry without rash or infection. HEENT: No palpable masses or thyromegaly. Sclera nonicteric. Pupils equal round and reactive. Lymph nodes: No cervical, supraclavicular, or inguinal nodes palpable. Breasts: Slight thickening upper inner right breast post biopsy. No other palpable abnormalities in either breast. No palpable axillary adenopathy. Lungs: Breath sounds clear and equal. No wheezing or increased work of breathing. Cardiovascular: Regular rate and rhythm without murmer. No JVD or edema. Abdomen: Nondistended. Soft and nontender. No masses palpable. No organomegaly. Extremities: No edema or joint swelling or deformity. No chronic venous stasis changes. Neurologic: Alert and fully oriented. Gait normal. No focal weakness. Psychiatric: Normal mood and affect. Thought content appropriate with normal judgement and insight    Assessment & Plan Erin Kitchen T. Alexandra Lipps MD; 08/31/2015 3:34 PM) BREAST CANCER OF UPPER-INNER QUADRANT OF RIGHT FEMALE BREAST (C50.211) Impression: 70 year old female with a new diagnosis of  cancer of the right breast, upper inner quadrant. Clinical stage IA, prognostic panel deferred to the final excision due to lack of tissue.. I discussed with the patient and family members present today initial surgical treatment options. We discussed options of breast conservation with lumpectomy or total mastectomy and sentinal lymph node biopsy/dissection. Options for reconstruction were discussed. After discussion they have elected to proceed with breast conservation with lumpectomy and sentinel lymph node biopsy. We discussed the indications and nature of the procedure, and expected recovery, in detail. Surgical risks including anesthetic complications, cardiorespiratory complications, bleeding, infection, wound healing complications, blood clots, lymphedema, local and distant recurrence and possible need for further surgery based on the final pathology was discussed and understood. Chemotherapy, hormonal therapy and radiation therapy have been discussed. They have been provided with literature regarding the treatment of breast cancer. All questions were answered. They understand and agree to proceed and we will go ahead with scheduling. Current Plans Referred to Radiation Oncology, for evaluation and follow up (Radiation Oncology). Routine. Referred to Physical Therapy, for evaluation and follow up (Physical Therapy). Routine. Referred to Oncology, for evaluation and follow up (Oncology). Routine. Radioactive seed localized right breast lumpectomy and right axillary sentinel lymph node biopsy

## 2015-09-22 ENCOUNTER — Ambulatory Visit (HOSPITAL_COMMUNITY)
Admission: RE | Admit: 2015-09-22 | Discharge: 2015-09-22 | Disposition: A | Discharge status: Home / Self Care | Payer: Medicare Other | Source: Ambulatory Visit | Attending: General Surgery | Admitting: General Surgery

## 2015-09-22 ENCOUNTER — Ambulatory Visit (HOSPITAL_BASED_OUTPATIENT_CLINIC_OR_DEPARTMENT_OTHER)
Admission: RE | Admit: 2015-09-22 | Discharge: 2015-09-22 | Disposition: A | Discharge status: Home / Self Care | Payer: Medicare Other | Source: Ambulatory Visit | Attending: General Surgery | Admitting: General Surgery

## 2015-09-22 ENCOUNTER — Encounter (HOSPITAL_BASED_OUTPATIENT_CLINIC_OR_DEPARTMENT_OTHER)
Admission: RE | Disposition: A | Discharge status: Home / Self Care | Payer: Self-pay | Source: Ambulatory Visit | Attending: General Surgery

## 2015-09-22 ENCOUNTER — Ambulatory Visit (HOSPITAL_BASED_OUTPATIENT_CLINIC_OR_DEPARTMENT_OTHER): Payer: Medicare Other | Admitting: Anesthesiology

## 2015-09-22 ENCOUNTER — Ambulatory Visit
Admission: RE | Admit: 2015-09-22 | Discharge: 2015-09-22 | Disposition: A | Discharge status: Home / Self Care | Payer: Medicare Other | Source: Ambulatory Visit | Attending: General Surgery | Admitting: General Surgery

## 2015-09-22 ENCOUNTER — Encounter (HOSPITAL_BASED_OUTPATIENT_CLINIC_OR_DEPARTMENT_OTHER): Payer: Self-pay | Admitting: *Deleted

## 2015-09-22 DIAGNOSIS — N189 Chronic kidney disease, unspecified: Secondary | ICD-10-CM | POA: Diagnosis not present

## 2015-09-22 DIAGNOSIS — Z79899 Other long term (current) drug therapy: Secondary | ICD-10-CM | POA: Diagnosis not present

## 2015-09-22 DIAGNOSIS — C50911 Malignant neoplasm of unspecified site of right female breast: Secondary | ICD-10-CM

## 2015-09-22 DIAGNOSIS — I10 Essential (primary) hypertension: Secondary | ICD-10-CM | POA: Insufficient documentation

## 2015-09-22 DIAGNOSIS — K219 Gastro-esophageal reflux disease without esophagitis: Secondary | ICD-10-CM | POA: Insufficient documentation

## 2015-09-22 DIAGNOSIS — C50211 Malignant neoplasm of upper-inner quadrant of right female breast: Secondary | ICD-10-CM

## 2015-09-22 DIAGNOSIS — I129 Hypertensive chronic kidney disease with stage 1 through stage 4 chronic kidney disease, or unspecified chronic kidney disease: Secondary | ICD-10-CM | POA: Diagnosis not present

## 2015-09-22 DIAGNOSIS — Z7984 Long term (current) use of oral hypoglycemic drugs: Secondary | ICD-10-CM | POA: Insufficient documentation

## 2015-09-22 DIAGNOSIS — R928 Other abnormal and inconclusive findings on diagnostic imaging of breast: Secondary | ICD-10-CM | POA: Diagnosis not present

## 2015-09-22 HISTORY — DX: Other specified postprocedural states: Z98.890

## 2015-09-22 HISTORY — PX: BREAST LUMPECTOMY WITH RADIOACTIVE SEED AND SENTINEL LYMPH NODE BIOPSY: SHX6550

## 2015-09-22 HISTORY — DX: Other specified postprocedural states: R11.2

## 2015-09-22 LAB — GLUCOSE, CAPILLARY: Glucose-Capillary: 117 mg/dL — ABNORMAL HIGH (ref 65–99)

## 2015-09-22 SURGERY — BREAST LUMPECTOMY WITH RADIOACTIVE SEED AND SENTINEL LYMPH NODE BIOPSY
Anesthesia: General | Site: Breast | Laterality: Right

## 2015-09-22 MED ORDER — HYDROCODONE-ACETAMINOPHEN 5-325 MG PO TABS: 1.0000 | ORAL_TABLET | ORAL | Status: DC | PRN

## 2015-09-22 MED ORDER — LIDOCAINE 2% (20 MG/ML) 5 ML SYRINGE
INTRAMUSCULAR | Status: AC
  Filled 2015-09-22: qty 5

## 2015-09-22 MED ORDER — DEXAMETHASONE SODIUM PHOSPHATE 4 MG/ML IJ SOLN
INTRAMUSCULAR | Status: DC | PRN
  Administered 2015-09-22: 10 mg via INTRAVENOUS

## 2015-09-22 MED ORDER — ONDANSETRON HCL 4 MG/2ML IJ SOLN
INTRAMUSCULAR | Status: AC
  Filled 2015-09-22: qty 2

## 2015-09-22 MED ORDER — SODIUM CHLORIDE 0.9 % IJ SOLN
INTRAVENOUS | Status: DC | PRN
  Administered 2015-09-22: 5 mL via INTRAMUSCULAR

## 2015-09-22 MED ORDER — TECHNETIUM TC 99M SULFUR COLLOID FILTERED
1.0000 | Freq: Once | INTRAVENOUS | Status: AC | PRN
  Administered 2015-09-22: 1 via INTRADERMAL

## 2015-09-22 MED ORDER — FENTANYL CITRATE (PF) 100 MCG/2ML IJ SOLN
INTRAMUSCULAR | Status: AC
  Filled 2015-09-22: qty 2

## 2015-09-22 MED ORDER — DEXAMETHASONE SODIUM PHOSPHATE 10 MG/ML IJ SOLN
INTRAMUSCULAR | Status: AC
  Filled 2015-09-22: qty 1

## 2015-09-22 MED ORDER — MIDAZOLAM HCL 2 MG/2ML IJ SOLN
INTRAMUSCULAR | Status: AC
  Filled 2015-09-22: qty 2

## 2015-09-22 MED ORDER — CEFAZOLIN SODIUM-DEXTROSE 2-4 GM/100ML-% IV SOLN
2.0000 g | INTRAVENOUS | Status: AC
  Administered 2015-09-22: 2 g via INTRAVENOUS

## 2015-09-22 MED ORDER — CEFAZOLIN SODIUM-DEXTROSE 2-4 GM/100ML-% IV SOLN
INTRAVENOUS | Status: AC
  Filled 2015-09-22: qty 100

## 2015-09-22 MED ORDER — PROMETHAZINE HCL 25 MG/ML IJ SOLN: 6.2500 mg | INTRAMUSCULAR | Status: DC | PRN

## 2015-09-22 MED ORDER — BUPIVACAINE-EPINEPHRINE (PF) 0.5% -1:200000 IJ SOLN
INTRAMUSCULAR | Status: DC | PRN
  Administered 2015-09-22: 10 mL

## 2015-09-22 MED ORDER — HYDROCODONE-ACETAMINOPHEN 5-325 MG PO TABS
1.0000 | ORAL_TABLET | Freq: Once | ORAL | Status: DC
Start: 2015-09-22 — End: 2015-09-22

## 2015-09-22 MED ORDER — HYDROMORPHONE HCL 1 MG/ML IJ SOLN
0.2500 mg | INTRAMUSCULAR | Status: DC | PRN
  Administered 2015-09-22 (×2): 0.5 mg via INTRAVENOUS

## 2015-09-22 MED ORDER — PROPOFOL 10 MG/ML IV BOLUS
INTRAVENOUS | Status: DC | PRN
  Administered 2015-09-22: 200 mg via INTRAVENOUS

## 2015-09-22 MED ORDER — HYDROMORPHONE HCL 1 MG/ML IJ SOLN
INTRAMUSCULAR | Status: AC
  Filled 2015-09-22: qty 1

## 2015-09-22 MED ORDER — FENTANYL CITRATE (PF) 100 MCG/2ML IJ SOLN
50.0000 ug | INTRAMUSCULAR | Status: AC | PRN
  Administered 2015-09-22: 25 ug via INTRAVENOUS
  Administered 2015-09-22: 50 ug via INTRAVENOUS
  Administered 2015-09-22: 25 ug via INTRAVENOUS
  Administered 2015-09-22 (×3): 50 ug via INTRAVENOUS

## 2015-09-22 MED ORDER — HYDROCODONE-ACETAMINOPHEN 5-325 MG PO TABS
1.0000 | ORAL_TABLET | Freq: Once | ORAL | Status: AC
  Administered 2015-09-22: 1 via ORAL

## 2015-09-22 MED ORDER — CHLORHEXIDINE GLUCONATE 4 % EX LIQD
1.0000 | Freq: Once | CUTANEOUS | Status: DC
Start: 2015-09-22 — End: 2015-09-22

## 2015-09-22 MED ORDER — ONDANSETRON HCL 4 MG/2ML IJ SOLN
INTRAMUSCULAR | Status: DC | PRN
  Administered 2015-09-22: 4 mg via INTRAVENOUS

## 2015-09-22 MED ORDER — CHLORHEXIDINE GLUCONATE 4 % EX LIQD: 1.0000 "application " | Freq: Once | CUTANEOUS | Status: DC

## 2015-09-22 MED ORDER — GLYCOPYRROLATE 0.2 MG/ML IJ SOLN: 0.2000 mg | Freq: Once | INTRAMUSCULAR | Status: DC | PRN

## 2015-09-22 MED ORDER — LACTATED RINGERS IV SOLN
INTRAVENOUS | Status: DC
  Administered 2015-09-22 (×2): via INTRAVENOUS

## 2015-09-22 MED ORDER — PROPOFOL 10 MG/ML IV BOLUS
INTRAVENOUS | Status: AC
  Filled 2015-09-22: qty 20

## 2015-09-22 MED ORDER — LIDOCAINE HCL (CARDIAC) 20 MG/ML IV SOLN
INTRAVENOUS | Status: DC | PRN
  Administered 2015-09-22: 50 mg via INTRAVENOUS

## 2015-09-22 MED ORDER — MIDAZOLAM HCL 2 MG/2ML IJ SOLN
1.0000 mg | INTRAMUSCULAR | Status: DC | PRN
  Administered 2015-09-22: 1.5 mg via INTRAVENOUS

## 2015-09-22 MED ORDER — HYDROCODONE-ACETAMINOPHEN 5-325 MG PO TABS
ORAL_TABLET | ORAL | Status: AC
  Filled 2015-09-22: qty 1

## 2015-09-22 MED ORDER — SCOPOLAMINE 1 MG/3DAYS TD PT72
1.0000 | MEDICATED_PATCH | Freq: Once | TRANSDERMAL | Status: DC | PRN
  Administered 2015-09-22: 1.5 mg via TRANSDERMAL

## 2015-09-22 SURGICAL SUPPLY — 48 items
APPLIER CLIP 9.375 MED OPEN (MISCELLANEOUS) ×3
APR CLP MED 9.3 20 MLT OPN (MISCELLANEOUS) ×1
BINDER BREAST LRG (GAUZE/BANDAGES/DRESSINGS) ×2 IMPLANT
BINDER BREAST XLRG (GAUZE/BANDAGES/DRESSINGS) IMPLANT
BLADE SURG 15 STRL LF DISP TIS (BLADE) ×1 IMPLANT
BLADE SURG 15 STRL SS (BLADE) ×3
CANISTER SUCT 1200ML W/VALVE (MISCELLANEOUS) ×2 IMPLANT
CHLORAPREP W/TINT 26ML (MISCELLANEOUS) ×3 IMPLANT
CLIP APPLIE 9.375 MED OPEN (MISCELLANEOUS) ×1 IMPLANT
COVER BACK TABLE 60X90IN (DRAPES) ×3 IMPLANT
COVER MAYO STAND STRL (DRAPES) ×3 IMPLANT
COVER PROBE W GEL 5X96 (DRAPES) ×3 IMPLANT
DEVICE DUBIN W/COMP PLATE 8390 (MISCELLANEOUS) ×3 IMPLANT
DRAPE LAPAROSCOPIC ABDOMINAL (DRAPES) ×3 IMPLANT
DRAPE UTILITY XL STRL (DRAPES) ×3 IMPLANT
ELECT COATED BLADE 2.86 ST (ELECTRODE) ×3 IMPLANT
ELECT REM PT RETURN 9FT ADLT (ELECTROSURGICAL) ×3
ELECTRODE REM PT RTRN 9FT ADLT (ELECTROSURGICAL) ×1 IMPLANT
GLOVE BIOGEL PI IND STRL 7.0 (GLOVE) IMPLANT
GLOVE BIOGEL PI IND STRL 8 (GLOVE) ×1 IMPLANT
GLOVE BIOGEL PI INDICATOR 7.0 (GLOVE) ×4
GLOVE BIOGEL PI INDICATOR 8 (GLOVE) ×2
GLOVE ECLIPSE 6.5 STRL STRAW (GLOVE) ×2 IMPLANT
GLOVE ECLIPSE 7.5 STRL STRAW (GLOVE) ×3 IMPLANT
GOWN STRL REUS W/ TWL LRG LVL3 (GOWN DISPOSABLE) ×1 IMPLANT
GOWN STRL REUS W/ TWL XL LVL3 (GOWN DISPOSABLE) ×1 IMPLANT
GOWN STRL REUS W/TWL LRG LVL3 (GOWN DISPOSABLE) ×3
GOWN STRL REUS W/TWL XL LVL3 (GOWN DISPOSABLE) ×3
ILLUMINATOR WAVEGUIDE N/F (MISCELLANEOUS) ×2 IMPLANT
KIT MARKER MARGIN INK (KITS) ×3 IMPLANT
LIQUID BAND (GAUZE/BANDAGES/DRESSINGS) ×3 IMPLANT
NDL HYPO 25X1 1.5 SAFETY (NEEDLE) ×2 IMPLANT
NDL SAFETY ECLIPSE 18X1.5 (NEEDLE) ×1 IMPLANT
NEEDLE HYPO 18GX1.5 SHARP (NEEDLE) ×3
NEEDLE HYPO 25X1 1.5 SAFETY (NEEDLE) ×6 IMPLANT
NS IRRIG 1000ML POUR BTL (IV SOLUTION) ×2 IMPLANT
PACK BASIN DAY SURGERY FS (CUSTOM PROCEDURE TRAY) ×3 IMPLANT
PENCIL BUTTON HOLSTER BLD 10FT (ELECTRODE) ×3 IMPLANT
SLEEVE SCD COMPRESS KNEE MED (MISCELLANEOUS) ×3 IMPLANT
SPONGE LAP 4X18 X RAY DECT (DISPOSABLE) ×3 IMPLANT
SUT MON AB 5-0 PS2 18 (SUTURE) ×3 IMPLANT
SUT VICRYL 3-0 CR8 SH (SUTURE) ×3 IMPLANT
SYR CONTROL 10ML LL (SYRINGE) ×6 IMPLANT
TOWEL OR 17X24 6PK STRL BLUE (TOWEL DISPOSABLE) ×3 IMPLANT
TOWEL OR NON WOVEN STRL DISP B (DISPOSABLE) ×3 IMPLANT
TUBE CONNECTING 20'X1/4 (TUBING) ×1
TUBE CONNECTING 20X1/4 (TUBING) ×1 IMPLANT
YANKAUER SUCT BULB TIP NO VENT (SUCTIONS) ×2 IMPLANT

## 2015-09-22 NOTE — Anesthesia Preprocedure Evaluation (Signed)
Anesthesia Evaluation  Patient identified by MRN, date of birth, ID band Patient awake    Reviewed: Allergy & Precautions, NPO status , Patient's Chart, lab work & pertinent test results  History of Anesthesia Complications (+) PONV  Airway Mallampati: II  TM Distance: >3 FB Neck ROM: Full    Dental   Pulmonary neg pulmonary ROS,    breath sounds clear to auscultation       Cardiovascular hypertension,  Rhythm:Regular Rate:Normal     Neuro/Psych    GI/Hepatic Neg liver ROS, GERD  ,  Endo/Other  negative endocrine ROS  Renal/GU Renal disease     Musculoskeletal   Abdominal   Peds  Hematology   Anesthesia Other Findings   Reproductive/Obstetrics                             Anesthesia Physical Anesthesia Plan  ASA: III  Anesthesia Plan: General   Post-op Pain Management: GA combined w/ Regional for post-op pain   Induction:   Airway Management Planned: LMA  Additional Equipment:   Intra-op Plan:   Post-operative Plan: Extubation in OR  Informed Consent: I have reviewed the patients History and Physical, chart, labs and discussed the procedure including the risks, benefits and alternatives for the proposed anesthesia with the patient or authorized representative who has indicated his/her understanding and acceptance.   Dental advisory given  Plan Discussed with: CRNA  Anesthesia Plan Comments:         Anesthesia Quick Evaluation

## 2015-09-22 NOTE — Op Note (Signed)
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 After extensive preoperative consultation and discussion detailed elsewhere we elected to proceed with radioactive seed localized right breast lumpectomy and right axillary sentinel lymph node biopsy as her initial surgical treatment.  Procedure Detail:  Patient had previously undergone accurate placement of a radioactive seed at the tumor and clip site in the upper inner right breast.  In the holding area she underwent injection of 1 mCi of technetium sulfur colloid intradermally around the right nipple as well as a pectoral block by anesthesia. She was brought to the operating room, placed in the supine position on the operating table, and a laryngeal mask general anesthesia induced. Under sterile technique after patient timeout I injected 5 mL of dilute methylene blue subcutaneously beneath the right nipple and massaged this for several minutes. Following this the entire right breast and chest, axilla, and upper arm were widely sterilely prepped and draped. She received preoperative IV antibiotics. Patient timeout was again performed and correct procedure verified. The neoprobe was used to localize the area of the tumor in the upper inner quadrant several centimeters from the areolar edge. I used a circumareolar incision superiorly and medially and using the Invuity lighted retractors raised a skin subcutaneous flap medially and superiorly overlying the seed and tumor. Using cautery I then deepened the dissection down around the  area of high counts toward the chest wall. The specimen was removed and the seed was not present in the first specimen but I could see it superficially just deep to what I had taken. I therefore extended essentially the same lumpectomy further posteriorly down to the chest wall and the second specimen was taken. These were essentially a contiguous single specimen divided with the posterior specimens anterior border the same as the anterior specimens posterior border. Both specimens were inked for margins and specimen x-ray showed the seed and marking clip centrally located within the more posterior specimen in terms of superior-inferior and medial-lateral orientation. The specimens were sent as right breast lumpectomy, anterior and posterior. Complete hemostasis was assured. The lumpectomy cavity was marked with clips. The breast and subcutaneous tissue was reapproximated with interrupted 3-0 Vicryl. Attention was turned to the sentinel lymph node. A hot area in the right axilla was easily localized and a small transverse incision made and dissection carried down through the subcutaneous tissue. The clavipectoral fascia was incised and using the neoprobe for guidance I dissected down onto a small bright blue lymph node with elevated counts. This was completely excised with cautery and had counts ex vivo of about 900. At this point I could not obtain any significant counts in the axilla, did not see any further blue dye or palpate any abnormal lymph nodes. This was sent as hot blue axillary sentinel lymph node. Hemostasis was assured and the deep axillary and subcutaneous tissues tissue closed with interrupted 3-0 Vicryl. Skin and subcutaneous tissue of both incisions was infiltrated with Marcaine and skin closed with running Subcuticular 5-0 Monocryl and Liquiban. Sponge needle and instrument counts were correct.    Findings: As above  Estimated Blood Loss:  Minimal  Drains: none  Blood Given: none           Specimens: #1 right breast lumpectomy, anterior and posterior portions     #2 right axillary sentinel lymph node        Complications:  * No complications entered in OR log *         Disposition: PACU - hemodynamically stable.         Condition: stable

## 2015-09-22 NOTE — Progress Notes (Signed)
Nuc med staff performed inj. Pt required additional fentanyl and tol well. VSS. Will bring family to bedside and update

## 2015-09-22 NOTE — Interval H&P Note (Signed)
History and Physical Interval Note:  09/22/2015 10:26 AM  [facility]  has presented today for surgery, with the diagnosis of RIGHT BREAST CANCER  The various methods of treatment have been discussed with the patient and family. After consideration of risks, benefits and other options for treatment, the patient has consented to  Procedure(s): BREAST LUMPECTOMY WITH RADIOACTIVE SEED AND SENTINEL LYMPH NODE BIOPSY (Right) as a surgical intervention .  The patient's history has been reviewed, patient examined, no change in status, stable for surgery.  I have reviewed the patient's chart and labs.  Questions were answered to the patient's satisfaction.     Jeryl Wilbourn T

## 2015-09-22 NOTE — Anesthesia Procedure Notes (Signed)
Procedure Name: LMA Insertion Date/Time: 09/22/2015 10:39 AM Performed by: Lieutenant Diego Pre-anesthesia Checklist: Patient identified, Emergency Drugs available, Suction available and Patient being monitored Patient Re-evaluated:Patient Re-evaluated prior to inductionOxygen Delivery Method: Circle System Utilized Preoxygenation: Pre-oxygenation with 100% oxygen Intubation Type: IV induction Ventilation: Mask ventilation without difficulty LMA: LMA inserted LMA Size: 4.0 Number of attempts: 1 Airway Equipment and Method: Bite block Placement Confirmation: positive ETCO2 Tube secured with: Tape Dental Injury: Teeth and Oropharynx as per pre-operative assessment

## 2015-09-22 NOTE — Anesthesia Postprocedure Evaluation (Signed)
Anesthesia Post Note  Patient: [name]  Procedure(s) Performed: Procedure(s) (LRB): BREAST LUMPECTOMY WITH RADIOACTIVE SEED AND SENTINEL LYMPH NODE BIOPSY (Right)  Patient location during evaluation: PACU Anesthesia Type: General Level of consciousness: awake Pain management: pain level controlled Vital Signs Assessment: post-procedure vital signs reviewed and stable Respiratory status: spontaneous breathing Cardiovascular status: stable Anesthetic complications: no    Last Vitals:  Filed Vitals:   09/22/15 1215 09/22/15 1230  BP: 135/69 136/65  Pulse: 88 86  Temp:    Resp: 21 14    Last Pain:  Filed Vitals:   09/22/15 1241  PainSc: 4                  EDWARDS,Aleiya Rye

## 2015-09-22 NOTE — Discharge Instructions (Signed)
[facility] °Office Phone Number [phone] ° °BREAST BIOPSY/ PARTIAL MASTECTOMY: POST OP INSTRUCTIONS ° °Always review your discharge instruction sheet given to you by the facility where your surgery was performed. ° °IF YOU HAVE DISABILITY OR FAMILY LEAVE FORMS, YOU MUST BRING THEM TO THE OFFICE FOR PROCESSING.  DO NOT GIVE THEM TO YOUR DOCTOR. ° °1. A prescription for pain medication may be given to you upon discharge.  Take your pain medication as prescribed, if needed.  If narcotic pain medicine is not needed, then you may take acetaminophen (Tylenol) or ibuprofen (Advil) as needed. °2. Take your usually prescribed medications unless otherwise directed °3. If you need a refill on your pain medication, please contact your pharmacy.  They will contact our office to request authorization.  Prescriptions will not be filled after 5pm or on week-ends. °4. You should eat very light the first 24 hours after surgery, such as soup, crackers, pudding, etc.  Resume your normal diet the day after surgery. °5. Most patients will experience some swelling and bruising in the breast.  Ice packs and a good support bra will help.  Swelling and bruising can take several days to resolve.  °6. It is common to experience some constipation if taking pain medication after surgery.  Increasing fluid intake and taking a stool softener will usually help or prevent this problem from occurring.  A mild laxative (Milk of Magnesia or Miralax) should be taken according to package directions if there are no bowel movements after 48 hours. °7. Unless discharge instructions indicate otherwise, you may remove your bandages 24-48 hours after surgery, and you may shower at that time.  You may have steri-strips (small skin tapes) in place directly over the incision.  These strips should be left on the skin for 7-10 days.  If your surgeon used skin glue on the incision, you may shower in 24 hours.  The glue will flake off over the  next 2-3 weeks.  Any sutures or staples will be removed at the office during your follow-up visit. °8. ACTIVITIES:  You may resume regular daily activities (gradually increasing) beginning the next day.  Wearing a good support bra or sports bra minimizes pain and swelling.  You may have sexual intercourse when it is comfortable. °a. You may drive when you no longer are taking prescription pain medication, you can comfortably wear a seatbelt, and you can safely maneuver your car and apply brakes. °b. RETURN TO WORK:  ______________________________________________________________________________________ °9. You should see your doctor in the office for a follow-up appointment approximately two weeks after your surgery.  Your doctor’s nurse will typically make your follow-up appointment when she calls you with your pathology report.  Expect your pathology report 2-3 business days after your surgery.  You may call to check if you do not hear from us after three days. °10. OTHER INSTRUCTIONS: _______________________________________________________________________________________________ _____________________________________________________________________________________________________________________________________ °_____________________________________________________________________________________________________________________________________ °_____________________________________________________________________________________________________________________________________ ° °WHEN TO CALL YOUR DOCTOR: °1. Fever over 101.0 °2. Nausea and/or vomiting. °3. Extreme swelling or bruising. °4. Continued bleeding from incision. °5. Increased pain, redness, or drainage from the incision. ° °The clinic staff is available to answer your questions during regular business hours.  Please don’t hesitate to call and ask to speak to one of the nurses for clinical concerns.  If you have a medical emergency, go to the nearest  emergency room or call 911.  A surgeon from [facility] is always on call at the hospital. ° °For further questions, please visit [email or web address]  ° ° ° °  Post Anesthesia Home Care Instructions ° °Activity: °Get plenty of rest for the remainder of the day. A responsible adult should stay with you for 24 hours following the procedure.  °For the next 24 hours, DO NOT: °-Drive a car °-Operate machinery °-Drink alcoholic beverages °-Take any medication unless instructed by your physician °-Make any legal decisions or sign important papers. ° °Meals: °Start with liquid foods such as gelatin or soup. Progress to regular foods as tolerated. Avoid greasy, spicy, heavy foods. If nausea and/or vomiting occur, drink only clear liquids until the nausea and/or vomiting subsides. Call your physician if vomiting continues. ° °Special Instructions/Symptoms: °Your throat may feel dry or sore from the anesthesia or the breathing tube placed in your throat during surgery. If this causes discomfort, gargle with warm salt water. The discomfort should disappear within 24 hours. ° °If you had a scopolamine patch placed behind your ear for the management of post- operative nausea and/or vomiting: ° °1. The medication in the patch is effective for 72 hours, after which it should be removed.  Wrap patch in a tissue and discard in the trash. Wash hands thoroughly with soap and water. °2. You may remove the patch earlier than 72 hours if you experience unpleasant side effects which may include dry mouth, dizziness or visual disturbances. °3. Avoid touching the patch. Wash your hands with soap and water after contact with the patch. °  ° °

## 2015-09-22 NOTE — Progress Notes (Signed)
Assisted Dr. Edwards with right, ultrasound guided, pectoralis block. Side rails up, monitors on throughout procedure. See vital signs in flow sheet. Tolerated Procedure well. 

## 2015-09-22 NOTE — Transfer of Care (Signed)
Immediate Anesthesia Transfer of Care Note  Patient: [facility]  Procedure(s) Performed: Procedure(s): BREAST LUMPECTOMY WITH RADIOACTIVE SEED AND SENTINEL LYMPH NODE BIOPSY (Right)  Patient Location: PACU  Anesthesia Type:General  Level of Consciousness: sedated  Airway & Oxygen Therapy: Patient Spontanous Breathing and Patient connected to face mask oxygen  Post-op Assessment: Report given to RN and Post -op Vital signs reviewed and stable  Post vital signs: Reviewed and stable  Last Vitals:  Filed Vitals:   09/22/15 1000 09/22/15 1005  BP: 143/73 146/71  Pulse: 85 84  Temp:    Resp: 14 17    Last Pain: There were no vitals filed for this visit.       Complications: No apparent anesthesia complications

## 2015-09-23 ENCOUNTER — Ambulatory Visit: Payer: Medicare Other | Admitting: Radiation Oncology

## 2015-09-23 ENCOUNTER — Encounter (HOSPITAL_BASED_OUTPATIENT_CLINIC_OR_DEPARTMENT_OTHER): Payer: Self-pay | Admitting: General Surgery

## 2015-09-23 ENCOUNTER — Ambulatory Visit: Payer: Medicare Other

## 2015-10-01 DIAGNOSIS — R87610 Atypical squamous cells of undetermined significance on cytologic smear of cervix (ASC-US): Secondary | ICD-10-CM | POA: Diagnosis not present

## 2015-10-01 DIAGNOSIS — Z124 Encounter for screening for malignant neoplasm of cervix: Secondary | ICD-10-CM | POA: Diagnosis not present

## 2015-10-01 DIAGNOSIS — Z01419 Encounter for gynecological examination (general) (routine) without abnormal findings: Secondary | ICD-10-CM | POA: Diagnosis not present

## 2015-10-13 NOTE — Progress Notes (Signed)
Location of Breast Cancer:Upper-inner quadrant of right female breast  Histology per Pathology Report: Diagnosis 09-22-15 1. Breast, lumpectomy, Right Posterior - INVASIVE DUCTAL CARCINOMA, GRADE II/III, SPANNING 0.3 CM. - THE SURGICAL RESECTION MARGINS ARE NEGATIVE FOR CARCINOMA. - SEE ONCOLOGY TABLE BELOW. 2. Breast, excision, Right Anterior - BENIGN BREAST PARENCHYMA. - THERE IS NO EVIDENCE OF MALIGNANCY. - SEE COMMENT. 3. Lymph node, sentinel, biopsy, Right Axillary - THERE IS NO EVIDENCE OF CARCINOMA IN 1 OF 1 LYMPH NODE (0/1). Receptor Status: ER(0% -), PR (0% -), Her2-neu (-), Ki-()  [name] presented with an abnormal mammogram.  Past/Anticipated interventions by surgeon, if any:  Past/Anticipated interventions by medical oncology, if any: Radiation referral  Lymphedema issues, if any:  None  Pain issues, if any:  No   SAFETY ISSUES:  Prior radiation? :Radiation to skull in 2006 or 2007 here at [facility]  Pacemaker/ICD?:No  Possible current pregnancy?:No  Is the patient on methotrexate? : No  Current Complaints / other details: Here to discuss radiation     Menarche15, G3,P3, BC No,Menopause47, HRT No  Myeloma clinicial remission 08-30-15 note Dr. Burney Gauze BP 133/73 mmHg  Pulse 100  Temp(Src) 98 F (36.7 C) (Oral)  Resp 16  Ht _0  (1.575 m)  Wt 189 lb 3.2 oz (85.821 kg)  BMI 34.60 kg/m2  SpO2 99%  LMP 05/22/2000  Erin Spurling, RN 10/13/2015,3:08 PM

## 2015-10-14 ENCOUNTER — Encounter: Payer: Self-pay | Admitting: Radiation Oncology

## 2015-10-14 ENCOUNTER — Ambulatory Visit
Admission: RE | Admit: 2015-10-14 | Discharge: 2015-10-14 | Disposition: A | Discharge status: Home / Self Care | Payer: Medicare Other | Source: Ambulatory Visit | Attending: Radiation Oncology | Admitting: Radiation Oncology

## 2015-10-14 VITALS — BP 133/73 | HR 100 | Temp 98.0°F | Resp 16 | Ht 62.0 in | Wt 189.2 lb

## 2015-10-14 DIAGNOSIS — C50911 Malignant neoplasm of unspecified site of right female breast: Secondary | ICD-10-CM

## 2015-10-14 DIAGNOSIS — Z170 Estrogen receptor positive status [ER+]: Principal | ICD-10-CM

## 2015-10-14 DIAGNOSIS — Z510 Encounter for antineoplastic radiation therapy: Secondary | ICD-10-CM | POA: Insufficient documentation

## 2015-10-14 NOTE — Progress Notes (Signed)
  Radiation Oncology         [phone]) [phone] ________________________________  Initial Outpatient Consultation - Date: 10/14/2015   Name: [name] MRN: [id number]   DOB: 03-Jan-1946  REFERRING PHYSICIAN: [name], MD  DIAGNOSIS AND STAGE: Invasive Ductal Carcinoma of the Right Female Breast.   HISTORY OF PRESENT ILLNESS::[name] is a 70 y.o. female with a past medical history of multiple myeloma initially diagnosed in 2006. She had chemotherapy, a bone marrow transplant, and skull radiation to treat this. She is now in clinical remission for this myeloma, receiving Zometa every 6 months with Dr. Marin Olp. She initially presented with an abnormal mammogram. Core biopsy revealed invasive ductal carcinoma grade II/III spanning 0.3 cm. She underwent a lumpectomy on 09/22/2015. Margins were negative. Lymph nodes were negative. The receptor status of her cancer was found to be ER(0%-), PR (0%-), and Her2-neu negative. 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 She is here today to discuss the role of radiation in the management of her disease. She is healing well. She would like to delay starting radiation until after school gets out (She drives a school bus).   Today, she denies any pain or lymphedema issues.   PREVIOUS RADIATION THERAPY: 19.8 Gy to the skull completed 09/21/2006  Past medical, social and family history were reviewed in the electronic chart. Review of symptoms was reviewed in the electronic chart. Medications were reviewed in the electronic chart.   PHYSICAL EXAM:  Filed Vitals:   10/14/15 0940  BP: 133/73  Pulse: 100  Temp: 98 F (36.7 C)  Resp: 16  .189 lb 3.2 oz (85.821 kg).   This is a pleasant female in no acute distress. She is alert and oriented. [name].   IMPRESSION: [name] is a pleasant female with invasive ductal carcinoma of the right female breast.   PLAN: I spoke to the  patient today regarding her diagnosis and options for treatment. We discussed the equivalence in terms of survival and local failure between mastectomy and breast conservation. We discussed the role of radiation in decreasing local failures in patients who undergo lumpectomy. We discussed the process of simulation and the placement tattoos. We discussed 4 weeks of treatment as an outpatient. We discussed the possibility of asymptomatic lung damage. We discussed the low likelihood of secondary malignancies. We discussed the possible side effects including but not limited to skin redness, fatigue, permanent skin darkening, and breast swelling.   The patient has signed informed consent and is prepared to proceed with radiation. CT simulation is scheduled for 10/19/2015 at 10:00 AM. She would like to start radiation after 11/01/2015 when the school year ends.    I spent 25 minutes face to face with the patient and more than 50% of that time was spent in counseling and/or coordination of care.   ------------------------------------------------  Thea Silversmith, MD  This document serves as a record of services personally performed by Thea Silversmith, MD. It was created on her behalf by Jenell Milliner, a trained medical scribe. The creation of this record is based on the scribe's personal observations and the provider's statements to them. This document has been checked and approved by the attending provider.

## 2015-10-14 NOTE — Addendum Note (Signed)
Encounter addended by: Malena Edman, RN on: 10/14/2015  1:17 PM<BR>     Documentation filed: Charges VN

## 2015-10-15 DIAGNOSIS — E668 Other obesity: Secondary | ICD-10-CM | POA: Diagnosis not present

## 2015-10-15 DIAGNOSIS — Z6834 Body mass index (BMI) 34.0-34.9, adult: Secondary | ICD-10-CM | POA: Diagnosis not present

## 2015-10-15 DIAGNOSIS — N183 Chronic kidney disease, stage 3 (moderate): Secondary | ICD-10-CM | POA: Diagnosis not present

## 2015-10-15 DIAGNOSIS — I129 Hypertensive chronic kidney disease with stage 1 through stage 4 chronic kidney disease, or unspecified chronic kidney disease: Secondary | ICD-10-CM | POA: Diagnosis not present

## 2015-10-15 DIAGNOSIS — E559 Vitamin D deficiency, unspecified: Secondary | ICD-10-CM | POA: Diagnosis not present

## 2015-10-15 DIAGNOSIS — R05 Cough: Secondary | ICD-10-CM | POA: Diagnosis not present

## 2015-10-15 DIAGNOSIS — E1122 Type 2 diabetes mellitus with diabetic chronic kidney disease: Secondary | ICD-10-CM | POA: Diagnosis not present

## 2015-10-15 DIAGNOSIS — C50011 Malignant neoplasm of nipple and areola, right female breast: Secondary | ICD-10-CM | POA: Diagnosis not present

## 2015-10-15 DIAGNOSIS — Z780 Asymptomatic menopausal state: Secondary | ICD-10-CM | POA: Diagnosis not present

## 2015-10-19 ENCOUNTER — Ambulatory Visit
Admission: RE | Admit: 2015-10-19 | Discharge: 2015-10-19 | Disposition: A | Discharge status: Home / Self Care | Payer: Medicare Other | Source: Ambulatory Visit | Attending: Radiation Oncology | Admitting: Radiation Oncology

## 2015-10-19 ENCOUNTER — Telehealth: Payer: Self-pay | Admitting: *Deleted

## 2015-10-19 DIAGNOSIS — Z510 Encounter for antineoplastic radiation therapy: Secondary | ICD-10-CM | POA: Diagnosis not present

## 2015-10-19 DIAGNOSIS — C50911 Malignant neoplasm of unspecified site of right female breast: Secondary | ICD-10-CM

## 2015-10-19 NOTE — Progress Notes (Signed)
Radiation Oncology         [phone] ________________________________  Name: [name]      MRN: [id number]          Date: 10/19/2015              DOB: July 20, 1945  Optical Surface Tracking Plan:  Since intensity modulated radiotherapy (IMRT) and 3D conformal radiation treatment methods are predicated on accurate and precise positioning for treatment, intrafraction motion monitoring is medically necessary to ensure accurate and safe treatment delivery.  The ability to quantify intrafraction motion without excessive ionizing radiation dose can only be performed with optical surface tracking. Accordingly, surface imaging offers the opportunity to obtain 3D measurements of patient position throughout IMRT and 3D treatments without excessive radiation exposure.  I am ordering optical surface tracking for this patient's upcoming course of radiotherapy. ________________________________ ------------------------------------------------  Thea Silversmith, MD  This document serves as a record of services personally performed by Thea Silversmith, MD. It was created on her behalf by Darcus Austin, a trained medical scribe. The creation of this record is based on the scribe's personal observations and the provider's statements to them. This document has been checked and approved by the attending provider.   Reference:   Ursula Alert, J, et al. Surface imaging-based analysis of intrafraction motion for breast radiotherapy patients.Journal of [place], n. 6, nov. 2014. ISSN [id number].   Available at: <http://www.jacmp.org/index.php/jacmp/article/view/4957>.

## 2015-10-19 NOTE — Progress Notes (Signed)
Name: [name]   MRN: [id number]  Date:  10/19/2015  DOB: 05-05-1946  Status:outpatient    DIAGNOSIS: Breast cancer.  CONSENT VERIFIED: yes   SET UP: Patient is setup supine   IMMOBILIZATION:  The following immobilization was used:Custom Moldable Pillow, breast board.   NARRATIVE: [name] was brought to the [facility].  Identity was confirmed.  All relevant records and images related to the planned course of therapy were reviewed.  Then, the patient was positioned in a stable reproducible clinical set-up for radiation therapy.  Wires were placed to delineate the clinical extent of breast tissue. A wire was placed on the scar as well.  CT images were obtained.  An isocenter was placed. Skin markings were placed.  The CT images were loaded into the planning software where the target and avoidance structures were contoured.  The radiation prescription was entered and confirmed. The patient was discharged in stable condition and tolerated simulation well.    TREATMENT PLANNING NOTE:  Treatment planning then occurred. I have requested : MLC's, isodose plan, basic dose calculation  I personally designed and supervised the construction of 3 medically necessary complex treatment devices for the protection of critical normal structures including the lungs and contralateral breast as well as the immobilization device which is necessary for set up certainty.   3D simulation occurred. I requested and analyzed a dose volume histogram of the heart, lungs and lumpectomy cavity.   ------------------------------------------------  Thea Silversmith, MD  This document serves as a record of services personally performed by Thea Silversmith, MD. It was created on her behalf by Darcus Austin, a trained medical scribe. The creation of this record is based on the scribe's personal observations and the provider's statements to them. This document has been checked and approved by the  attending provider.

## 2015-10-19 NOTE — Telephone Encounter (Signed)
Spoke to pt concerning navigation resources. Gave contact information. Denies questions or needs at this time. Encourage pt to call with concerns. Received verbal understanding.

## 2015-10-19 NOTE — Telephone Encounter (Signed)
Left vm for pt to return call to discuss navigation resources. Contact information provided.

## 2015-10-22 ENCOUNTER — Other Ambulatory Visit (HOSPITAL_BASED_OUTPATIENT_CLINIC_OR_DEPARTMENT_OTHER): Payer: Medicare Other

## 2015-10-22 ENCOUNTER — Ambulatory Visit (HOSPITAL_BASED_OUTPATIENT_CLINIC_OR_DEPARTMENT_OTHER): Payer: Medicare Other | Admitting: Hematology & Oncology

## 2015-10-22 VITALS — BP 142/71 | HR 68 | Temp 98.6°F | Resp 18 | Ht 62.0 in | Wt 186.0 lb

## 2015-10-22 DIAGNOSIS — C9001 Multiple myeloma in remission: Secondary | ICD-10-CM

## 2015-10-22 DIAGNOSIS — M818 Other osteoporosis without current pathological fracture: Secondary | ICD-10-CM | POA: Diagnosis not present

## 2015-10-22 DIAGNOSIS — C50911 Malignant neoplasm of unspecified site of right female breast: Secondary | ICD-10-CM

## 2015-10-22 DIAGNOSIS — T386X5A Adverse effect of antigonadotrophins, antiestrogens, antiandrogens, not elsewhere classified, initial encounter: Secondary | ICD-10-CM | POA: Diagnosis not present

## 2015-10-22 DIAGNOSIS — Z171 Estrogen receptor negative status [ER-]: Secondary | ICD-10-CM

## 2015-10-22 DIAGNOSIS — Z170 Estrogen receptor positive status [ER+]: Principal | ICD-10-CM

## 2015-10-22 LAB — COMPREHENSIVE METABOLIC PANEL
ALT: 41 U/L (ref 0–55)
AST: 35 U/L — AB (ref 5–34)
Albumin: 3.5 g/dL (ref 3.5–5.0)
Alkaline Phosphatase: 79 U/L (ref 40–150)
Anion Gap: 8 mEq/L (ref 3–11)
BUN: 14 mg/dL (ref 7.0–26.0)
CHLORIDE: 110 meq/L — AB (ref 98–109)
CO2: 24 mEq/L (ref 22–29)
Calcium: 9.4 mg/dL (ref 8.4–10.4)
Creatinine: 1.6 mg/dL — ABNORMAL HIGH (ref 0.6–1.1)
EGFR: 37 mL/min/{1.73_m2} — ABNORMAL LOW (ref >90)
Glucose: 124 mg/dl (ref 70–140)
POTASSIUM: 4.2 meq/L (ref 3.5–5.1)
Sodium: 142 mEq/L (ref 136–145)
Total Bilirubin: 0.3 mg/dL (ref 0.20–1.20)
Total Protein: 7.9 g/dL (ref 6.4–8.3)

## 2015-10-22 LAB — CBC WITH DIFFERENTIAL (CANCER CENTER ONLY)
BASO#: 0 10*3/uL (ref 0.0–0.2)
BASO%: 0.3 % (ref 0.0–2.0)
EOS%: 6.7 % (ref 0.0–7.0)
Eosinophils Absolute: 0.5 10*3/uL (ref 0.0–0.5)
HCT: 36 % (ref 34.8–46.6)
HGB: 11.9 g/dL (ref 11.6–15.9)
LYMPH#: 1.9 10*3/uL (ref 0.9–3.3)
LYMPH%: 27.3 % (ref 14.0–48.0)
MCH: 31.3 pg (ref 26.0–34.0)
MCHC: 33.1 g/dL (ref 32.0–36.0)
MCV: 95 fL (ref 81–101)
MONO#: 0.6 10*3/uL (ref 0.1–0.9)
MONO%: 9.2 % (ref 0.0–13.0)
NEUT#: 4 10*3/uL (ref 1.5–6.5)
NEUT%: 56.5 % (ref 39.6–80.0)
PLATELETS: 345 10*3/uL (ref 145–400)
RBC: 3.8 10*6/uL (ref 3.70–5.32)
RDW: 13.5 % (ref 11.1–15.7)
WBC: 7 10*3/uL (ref 3.9–10.0)

## 2015-10-22 MED ORDER — DEXAMETHASONE 4 MG PO TABS: ORAL_TABLET | ORAL | Status: DC

## 2015-10-22 NOTE — Progress Notes (Signed)
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 : Now have breast cancer in my right breast.  HPI: [name] is well-known to me. She is a 70 year old postmenopausal African-American female. She has a history of myeloma. 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 She is currently off any type of maintenance therapy outside of Zometa. She had a routine mammogram done recently. This showed that there is a nodule in the right breast. This was confirmed on ultrasound.  She then underwent a biopsy on March 31st. The pathology report ([id number] showed an invasive ductal carcinoma. 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 The tumor was triple negative.  She then underwent a lumpectomy. She had this on May 3. The pathology report ([id number]) showed invasive carcinoma measuring 0.3 cm. All margins were negative. Sentinel lymph node was negative. There is no lymphovascular invasion. The tumor was fairly high grade.  She has recovered from surgery. 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 She does not want start anything until after school finishes in a week or so.   She is not taking any estrogens. There is no history of breast cancer in the family .  She has I think 4 kids. She had her first child before the age of 60.   She does not smoke. She does not drink.   She feels pretty well. She did have a bone densities test done recently. This was normal without any osteopenia or osteoporosis.  She has been getting Zometa every 6 months for the myeloma to help with her bone density which obviously is working.  Overall, her performance status is ECOG 0.    Past Medical History  Diagnosis Date  . Degenerative joint disease involving multiple joints 03/29/2011  . 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 Anemia   . GERD (gastroesophageal reflux disease)    . Vitamin D deficiency   . Hyperlipidemia   . Paget's bone disease   . History of multiple myeloma     chemo XRT, Dr. Marin Olp  . DJD (degenerative joint disease)   . OA (osteoarthritis)   . Renal insufficiency     H/O resolved  . Herpes simplex esophagitis 2006  . HTN (hypertension) 2005  . Chronic pain of left knee   . History of blood transfusion     with first pregnancy  . Myeloma ([place]) 03/28/2011  . Personal history of adenomatous colonic polyps 11/02/2011    2 diminutive serrated adenomas 10/2011 Repeat colonoscopy 2018 (change from original recall based upon current guidelines 12/07/2014)    . Breast cancer, stage 1 ([place]) 09/09/2015  . PONV (postoperative nausea and vomiting)   :  Past Surgical History  Procedure Laterality Date  . 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 Knee arthroscopy  05/2008    left  . Total knee arthroplasty  10/11/2010    left  . Joint replacement Left   . Breast lumpectomy with radioactive seed and sentinel lymph node biopsy Right 09/22/2015    Procedure: BREAST LUMPECTOMY WITH RADIOACTIVE SEED AND SENTINEL LYMPH NODE BIOPSY;  Surgeon: Excell Seltzer, MD;  Location: [facility];  Service: General;  Laterality: Right;  :   Current outpatient prescriptions:  .  dexlansoprazole (DEXILANT) 60 MG capsule, Take  60 mg by mouth daily., Disp: , Rfl:  .  diltiazem (CARDIZEM CD) 240 MG 24 hr capsule, Take 1 capsule (240 mg total) by mouth daily., Disp: 30 capsule, Rfl: 0 .  hydrochlorothiazide (HYDRODIURIL) 25 MG tablet, Take 25 mg by mouth daily., Disp: , Rfl:  .  HYDROcodone-acetaminophen (NORCO/VICODIN) 5-325 MG tablet, Take 1-2 tablets by mouth every 4 (four) hours as needed for moderate pain or severe pain., Disp: 25 tablet, Rfl: 0 .  metFORMIN (GLUCOPHAGE) 500 MG tablet, Take 500 mg by mouth 2 (two) times daily with a meal. , Disp: , Rfl: 1 .  Vitamin D, Ergocalciferol, (DRISDOL) 50000  units CAPS capsule, Take 50,000 Units by mouth every 7 (seven) days., Disp: , Rfl:  .  Zoledronic Acid (ZOMETA IV), Inject into the vein. Due now, Disp: , Rfl: :  :  No Known Allergies:  Family History  Problem Relation Age of Onset  . Colon cancer Neg Hx   . Lung cancer Father     smoker  . Throat cancer Brother   . Heart attack Mother   . Diabetes Mother   . Diabetes Sister   . Liver cancer Brother   . Pancreatic cancer Sister   . Heart attack Mother   . Rheum arthritis Sister   :  Social History   Social History  . Marital Status: Legally Separated    Spouse Name: N/A  . Number of Children: 3  . Years of Education: N/A   Occupational History  . disability    Social History Main Topics  . Smoking status: Never Smoker   . Smokeless tobacco: Never Used     Comment: never used tobacco  . Alcohol Use: No  . Drug Use: No  . Sexual Activity: No   Other Topics Concern  . Not on file   Social History Narrative   Workouts:   Treadmill 3x/ week   Arm weights daily         :  Pertinent items are noted in HPI.  Exam: '@IPVITALS' @ Well-developed and well-nourished African-American female in no obvious distress. Vital signs show a temp tour of 98.6. Pulse 68. Blood pressure 142/71. Weight is 186 pounds. Head and neck exam shows no ocular or oral lesions. There are no palpable cervical or supraclavicular lymph nodes. Lungs are clear bilaterally. Cardiac exam regular rate and rhythm with no murmurs, rubs or bruits. Breast exam shows left breast with no masses, edema or erythema. There is no left axillary adenopathy. Right breast shows a healing lumpectomy scar at about the 12:00 position. There is no mass. There is no right axillary adenopathy. Abdomen is soft. She has good bowel sounds. There is no fluid wave or there is no palpable liver or spleen tip. Back exam shows no tenderness over the spine, ribs or hips. Extremities shows some mild swelling of the right arm. She has  good range of motion of the right arm. She has good strength in her joints. Neurological exam shows no focal neurological deficits.   Recent Labs  10/22/15 0950  WBC 7.0  HGB 11.9  HCT 36.0  PLT 345    Recent Labs  10/22/15 0950  NA 142  K 4.2  CO2 24  GLUCOSE 124  BUN 14.0  CREATININE 1.6*  CALCIUM 9.4    Blood smear review:  None  Pathology: See above     Assessment and Plan:  [name] is a [place] 45-year-old African-American female. She has a past  history of myeloma. She now is a new diagnosis of stage I infiltrate duct carcinoma of the right breast. The problem is that this is triple negative. Even though this is a small tumor, I think we have to treat this aggressively. She will clearly benefit from adjuvant chemotherapy. I think this would be very reasonable.  She will also need adjuvant radiation therapy.  I think that Taxotere/carboplatin would be reasonable. Given that this is triple negative, a platinum agent I think would be helpful.  I spoke to her for about 45 minutes or so. I explained why thought that chemotherapy would be helpful. I think with chemotherapy and radiation therapy, we can decrease the risk of recurrence to less than 10%.   I went over the side effects of chemotherapy. I will try to do 4 cycles of chemotherapy in a dose dense fashion. She is very healthy. Nothing she can tolerate dose dense treatment.  She will lose her hair. I talked her about her blood counts going down. I talked to her about the risk of infection. I talked to her about the possibility of fluid retention. She may have some nausea and vomiting. She understands all this well. She agrees to have chemotherapy.  We will get a Port-A-Cath replaced. I will get Dr. Excell Seltzer to do this for [place].  We will get started after she gets finished with school for the year. This probably will be the week of June 19.  We will get her back to see [place] 2 weeks afterwards for her  second cycle of treatment.

## 2015-10-22 NOTE — Patient Instructions (Signed)
Docetaxel injection  What is this medicine?  DOCETAXEL (doe se TAX el) is a chemotherapy drug. It targets fast dividing cells, like cancer cells, and causes these cells to die. This medicine is used to treat many types of cancers like breast cancer, certain stomach cancers, head and neck cancer, lung cancer, and prostate cancer.  This medicine may be used for other purposes; ask your health care provider or pharmacist if you have questions.  What should I tell my health care provider before I take this medicine?  They need to know if you have any of these conditions:  -infection (especially a virus infection such as chickenpox, cold sores, or herpes)  -liver disease  -low blood counts, like low white cell, platelet, or red cell counts  -an unusual or allergic reaction to docetaxel, polysorbate 80, other chemotherapy agents, other medicines, foods, dyes, or preservatives  -pregnant or trying to get pregnant  -breast-feeding  How should I use this medicine?  This drug is given as an infusion into a vein. It is administered in a hospital or clinic by a specially trained health care professional.  Talk to your pediatrician regarding the use of this medicine in children. Special care may be needed.  Overdosage: If you think you have taken too much of this medicine contact a poison control center or emergency room at once.  NOTE: This medicine is only for you. Do not share this medicine with others.  What if I miss a dose?  It is important not to miss your dose. Call your doctor or health care professional if you are unable to keep an appointment.  What may interact with this medicine?  -cyclosporine  -erythromycin  -ketoconazole  -medicines to increase blood counts like filgrastim, pegfilgrastim, sargramostim  -vaccines  Talk to your doctor or health care professional before taking any of these medicines:  -acetaminophen  -aspirin  -ibuprofen  -ketoprofen  -naproxen  This list may not describe all possible interactions.  Give your health care provider a list of all the medicines, herbs, non-prescription drugs, or dietary supplements you use. Also tell them if you smoke, drink alcohol, or use illegal drugs. Some items may interact with your medicine.  What should I watch for while using this medicine?  Your condition will be monitored carefully while you are receiving this medicine. You will need important blood work done while you are taking this medicine.  This drug may make you feel generally unwell. This is not uncommon, as chemotherapy can affect healthy cells as well as cancer cells. Report any side effects. Continue your course of treatment even though you feel ill unless your doctor tells you to stop.  In some cases, you may be given additional medicines to help with side effects. Follow all directions for their use.  Call your doctor or health care professional for advice if you get a fever, chills or sore throat, or other symptoms of a cold or flu. Do not treat yourself. This drug decreases your body's ability to fight infections. Try to avoid being around people who are sick.  This medicine may increase your risk to bruise or bleed. Call your doctor or health care professional if you notice any unusual bleeding.  This medicine may contain alcohol in the product. You may get drowsy or dizzy. Do not drive, use machinery, or do anything that needs mental alertness until you know how this medicine affects you. Do not stand or sit up quickly, especially if you are an older   There is a potential for serious side effects to an unborn child. Talk to your health care professional or pharmacist for more information. Do not breast-feed an infant while taking this medicine. What side effects may I notice  from receiving this medicine? Side effects that you should report to your doctor or health care professional as soon as possible: -allergic reactions like skin rash, itching or hives, swelling of the face, lips, or tongue -low blood counts - This drug may decrease the number of white blood cells, red blood cells and platelets. You may be at increased risk for infections and bleeding. -signs of infection - fever or chills, cough, sore throat, pain or difficulty passing urine -signs of decreased platelets or bleeding - bruising, pinpoint red spots on the skin, black, tarry stools, nosebleeds -signs of decreased red blood cells - unusually weak or tired, fainting spells, lightheadedness -breathing problems -fast or irregular heartbeat -low blood pressure -mouth sores -nausea and vomiting -pain, swelling, redness or irritation at the injection site -pain, tingling, numbness in the hands or feet -swelling of the ankle, feet, hands -weight gain Side effects that usually do not require medical attention (report to your prescriber or health care professional if they continue or are bothersome): -bone pain -complete hair loss including hair on your head, underarms, pubic hair, eyebrows, and eyelashes -diarrhea -excessive tearing -changes in the color of fingernails -loosening of the fingernails -nausea -muscle pain -red flush to skin -sweating -weak or tired This list may not describe all possible side effects. Call your doctor for medical advice about side effects. You may report side effects to FDA at 1-800-FDA-1088. Where should I keep my medicine? This drug is given in a hospital or clinic and will not be stored at home. NOTE: This sheet is a summary. It may not cover all possible information. If you have questions about this medicine, talk to your doctor, pharmacist, or health care provider.    2016, Elsevier/Gold Standard. (2014-05-25 16:04:57) Carboplatin injection What is this  medicine? CARBOPLATIN (KAR boe pla tin) is a chemotherapy drug. It targets fast dividing cells, like cancer cells, and causes these cells to die. This medicine is used to treat ovarian cancer and many other cancers. This medicine may be used for other purposes; ask your health care provider or pharmacist if you have questions. What should I tell my health care provider before I take this medicine? They need to know if you have any of these conditions: -blood disorders -hearing problems -kidney disease -recent or ongoing radiation therapy -an unusual or allergic reaction to carboplatin, cisplatin, other chemotherapy, other medicines, foods, dyes, or preservatives -pregnant or trying to get pregnant -breast-feeding How should I use this medicine? This drug is usually given as an infusion into a vein. It is administered in a hospital or clinic by a specially trained health care professional. Talk to your pediatrician regarding the use of this medicine in children. Special care may be needed. Overdosage: If you think you have taken too much of this medicine contact a poison control center or emergency room at once. NOTE: This medicine is only for you. Do not share this medicine with others. What if I miss a dose? It is important not to miss a dose. Call your doctor or health care professional if you are unable to keep an appointment. What may interact with this medicine? -medicines for seizures -medicines to increase blood counts like filgrastim, pegfilgrastim, sargramostim -some antibiotics like amikacin, gentamicin, neomycin, streptomycin, tobramycin -vaccines Talk   Talk to your doctor or health care professional before taking any of these medicines: -acetaminophen -aspirin -ibuprofen -ketoprofen -naproxen This list may not describe all possible interactions. Give your health care provider a list of all the medicines, herbs, non-prescription drugs, or dietary supplements you use. Also tell them  if you smoke, drink alcohol, or use illegal drugs. Some items may interact with your medicine. What should I watch for while using this medicine? Your condition will be monitored carefully while you are receiving this medicine. You will need important blood work done while you are taking this medicine. This drug may make you feel generally unwell. This is not uncommon, as chemotherapy can affect healthy cells as well as cancer cells. Report any side effects. Continue your course of treatment even though you feel ill unless your doctor tells you to stop. In some cases, you may be given additional medicines to help with side effects. Follow all directions for their use. Call your doctor or health care professional for advice if you get a fever, chills or sore throat, or other symptoms of a cold or flu. Do not treat yourself. This drug decreases your body's ability to fight infections. Try to avoid being around people who are sick. This medicine may increase your risk to bruise or bleed. Call your doctor or health care professional if you notice any unusual bleeding. Be careful brushing and flossing your teeth or using a toothpick because you may get an infection or bleed more easily. If you have any dental work done, tell your dentist you are receiving this medicine. Avoid taking products that contain aspirin, acetaminophen, ibuprofen, naproxen, or ketoprofen unless instructed by your doctor. These medicines may hide a fever. Do not become pregnant while taking this medicine. Women should inform their doctor if they wish to become pregnant or think they might be pregnant. There is a potential for serious side effects to an unborn child. Talk to your health care professional or pharmacist for more information. Do not breast-feed an infant while taking this medicine. What side effects may I notice from receiving this medicine? Side effects that you should report to your doctor or health care professional as  soon as possible: -allergic reactions like skin rash, itching or hives, swelling of the face, lips, or tongue -signs of infection - fever or chills, cough, sore throat, pain or difficulty passing urine -signs of decreased platelets or bleeding - bruising, pinpoint red spots on the skin, black, tarry stools, nosebleeds -signs of decreased red blood cells - unusually weak or tired, fainting spells, lightheadedness -breathing problems -changes in hearing -changes in vision -chest pain -high blood pressure -low blood counts - This drug may decrease the number of white blood cells, red blood cells and platelets. You may be at increased risk for infections and bleeding. -nausea and vomiting -pain, swelling, redness or irritation at the injection site -pain, tingling, numbness in the hands or feet -problems with balance, talking, walking -trouble passing urine or change in the amount of urine Side effects that usually do not require medical attention (report to your doctor or health care professional if they continue or are bothersome): -hair loss -loss of appetite -metallic taste in the mouth or changes in taste This list may not describe all possible side effects. Call your doctor for medical advice about side effects. You may report side effects to FDA at 1-800-FDA-1088. Where should I keep my medicine? This drug is given in a hospital or clinic and will not be  stored at home. NOTE: This sheet is a summary. It may not cover all possible information. If you have questions about this medicine, talk to your doctor, pharmacist, or health care provider.    2016, Elsevier/Gold Standard. (2007-08-13 14:38:05)

## 2015-10-23 LAB — VITAMIN D 25 HYDROXY (VIT D DEFICIENCY, FRACTURES): Vitamin D, 25-Hydroxy: 22.9 ng/mL — ABNORMAL LOW (ref 30.0–100.0)

## 2015-10-25 ENCOUNTER — Other Ambulatory Visit: Payer: Self-pay | Admitting: *Deleted

## 2015-10-25 DIAGNOSIS — C50911 Malignant neoplasm of unspecified site of right female breast: Secondary | ICD-10-CM

## 2015-10-25 MED ORDER — DEXAMETHASONE 4 MG PO TABS: ORAL_TABLET | ORAL | Status: DC

## 2015-10-26 ENCOUNTER — Ambulatory Visit: Payer: Medicare Other

## 2015-10-27 ENCOUNTER — Ambulatory Visit: Payer: Medicare Other

## 2015-10-28 ENCOUNTER — Ambulatory Visit: Payer: Medicare Other

## 2015-10-29 ENCOUNTER — Ambulatory Visit: Payer: Medicare Other

## 2015-10-29 DIAGNOSIS — C50911 Malignant neoplasm of unspecified site of right female breast: Secondary | ICD-10-CM | POA: Diagnosis not present

## 2015-10-29 DIAGNOSIS — C50919 Malignant neoplasm of unspecified site of unspecified female breast: Secondary | ICD-10-CM | POA: Diagnosis not present

## 2015-11-01 ENCOUNTER — Ambulatory Visit: Payer: Medicare Other

## 2015-11-02 ENCOUNTER — Ambulatory Visit: Payer: Medicare Other | Admitting: Radiation Oncology

## 2015-11-02 ENCOUNTER — Ambulatory Visit
Admission: RE | Admit: 2015-11-02 | Discharge: 2015-11-02 | Disposition: A | Discharge status: Home / Self Care | Payer: Medicare Other | Source: Ambulatory Visit | Attending: Radiation Oncology | Admitting: Radiation Oncology

## 2015-11-03 ENCOUNTER — Ambulatory Visit: Admission: RE | Admit: 2015-11-03 | Payer: Medicare Other | Source: Ambulatory Visit

## 2015-11-03 ENCOUNTER — Ambulatory Visit: Payer: Medicare Other

## 2015-11-04 ENCOUNTER — Ambulatory Visit: Payer: Medicare Other

## 2015-11-04 ENCOUNTER — Ambulatory Visit: Admission: RE | Admit: 2015-11-04 | Payer: Medicare Other | Source: Ambulatory Visit

## 2015-11-05 ENCOUNTER — Ambulatory Visit: Admission: RE | Admit: 2015-11-05 | Payer: Medicare Other | Source: Ambulatory Visit

## 2015-11-05 ENCOUNTER — Ambulatory Visit: Payer: Medicare Other

## 2015-11-08 ENCOUNTER — Ambulatory Visit: Admission: RE | Admit: 2015-11-08 | Payer: Medicare Other | Source: Ambulatory Visit

## 2015-11-08 ENCOUNTER — Ambulatory Visit: Payer: Medicare Other

## 2015-11-09 ENCOUNTER — Ambulatory Visit: Payer: Medicare Other

## 2015-11-09 ENCOUNTER — Ambulatory Visit: Admission: RE | Admit: 2015-11-09 | Payer: Medicare Other | Source: Ambulatory Visit

## 2015-11-10 ENCOUNTER — Ambulatory Visit: Admission: RE | Admit: 2015-11-10 | Payer: Medicare Other | Source: Ambulatory Visit

## 2015-11-10 ENCOUNTER — Ambulatory Visit (HOSPITAL_BASED_OUTPATIENT_CLINIC_OR_DEPARTMENT_OTHER): Payer: Medicare Other

## 2015-11-10 ENCOUNTER — Other Ambulatory Visit: Payer: Self-pay | Admitting: Hematology & Oncology

## 2015-11-10 ENCOUNTER — Ambulatory Visit: Payer: Medicare Other

## 2015-11-10 VITALS — BP 153/65 | HR 80 | Temp 97.5°F | Resp 18

## 2015-11-10 DIAGNOSIS — Z5111 Encounter for antineoplastic chemotherapy: Secondary | ICD-10-CM | POA: Diagnosis not present

## 2015-11-10 DIAGNOSIS — C50911 Malignant neoplasm of unspecified site of right female breast: Secondary | ICD-10-CM

## 2015-11-10 DIAGNOSIS — Z5189 Encounter for other specified aftercare: Secondary | ICD-10-CM

## 2015-11-10 MED ORDER — PROCHLORPERAZINE MALEATE 10 MG PO TABS: 10.0000 mg | ORAL_TABLET | Freq: Four times a day (QID) | ORAL | Status: DC | PRN

## 2015-11-10 MED ORDER — ONDANSETRON HCL 8 MG PO TABS: 8.0000 mg | ORAL_TABLET | Freq: Two times a day (BID) | ORAL | Status: DC | PRN

## 2015-11-10 MED ORDER — SODIUM CHLORIDE 0.9% FLUSH
10.0000 mL | INTRAVENOUS | Status: DC | PRN
  Administered 2015-11-10: 10 mL
  Filled 2015-11-10: qty 10

## 2015-11-10 MED ORDER — PEGFILGRASTIM 6 MG/0.6ML ~~LOC~~ PSKT
6.0000 mg | PREFILLED_SYRINGE | Freq: Once | SUBCUTANEOUS | Status: AC
  Administered 2015-11-10: 6 mg via SUBCUTANEOUS

## 2015-11-10 MED ORDER — DEXAMETHASONE 4 MG PO TABS: 8.0000 mg | ORAL_TABLET | Freq: Two times a day (BID) | ORAL | Status: DC

## 2015-11-10 MED ORDER — SODIUM CHLORIDE 0.9 % IV SOLN
Freq: Once | INTRAVENOUS | Status: AC
  Administered 2015-11-10: 12:00:00 via INTRAVENOUS

## 2015-11-10 MED ORDER — PALONOSETRON HCL INJECTION 0.25 MG/5ML
0.2500 mg | Freq: Once | INTRAVENOUS | Status: AC
  Administered 2015-11-10: 0.25 mg via INTRAVENOUS

## 2015-11-10 MED ORDER — SODIUM CHLORIDE 0.9 % IV SOLN
349.0000 mg | Freq: Once | INTRAVENOUS | Status: AC
  Administered 2015-11-10: 350 mg via INTRAVENOUS
  Filled 2015-11-10: qty 35

## 2015-11-10 MED ORDER — SODIUM CHLORIDE 0.9 % IV SOLN
10.0000 mg | Freq: Once | INTRAVENOUS | Status: AC
  Administered 2015-11-10: 10 mg via INTRAVENOUS
  Filled 2015-11-10: qty 1

## 2015-11-10 MED ORDER — PALONOSETRON HCL INJECTION 0.25 MG/5ML
INTRAVENOUS | Status: AC
Start: 2015-11-10 — End: 2015-11-10
  Filled 2015-11-10: qty 5

## 2015-11-10 MED ORDER — LIDOCAINE-PRILOCAINE 2.5-2.5 % EX CREA: TOPICAL_CREAM | CUTANEOUS | Status: DC

## 2015-11-10 MED ORDER — HEPARIN SOD (PORK) LOCK FLUSH 100 UNIT/ML IV SOLN
500.0000 [IU] | Freq: Once | INTRAVENOUS | Status: AC | PRN
  Administered 2015-11-10: 500 [IU]
  Filled 2015-11-10: qty 5

## 2015-11-10 MED ORDER — DOCETAXEL CHEMO INJECTION 160 MG/16ML
60.0000 mg/m2 | Freq: Once | INTRAVENOUS | Status: AC
  Administered 2015-11-10: 120 mg via INTRAVENOUS
  Filled 2015-11-10: qty 12

## 2015-11-10 MED FILL — ONDANSETRON HCL 8 MG TABLET: 8 | 15 days supply | Qty: 30 | Fill #0

## 2015-11-10 MED FILL — LIDOCAINE-PRILOCAINE CREAM: 2.5-2.5 | 20 days supply | Qty: 30 | Fill #0

## 2015-11-10 MED FILL — PROCHLORPERAZINE 10 MG TAB: 10 | 7 days supply | Qty: 30 | Fill #0

## 2015-11-10 NOTE — Patient Instructions (Addendum)
[facility] Discharge Instructions for Patients Receiving Chemotherapy  Today you received the following chemotherapy agents Taxotere, Carboplatin  To help prevent nausea and vomiting after your treatment, we encourage you to take your nausea medication   1) Zofran 8 mg.  Take 1 tablet by mouth 2 times daily as needed for nausea or vomiting.  Start on Day 3 after chemo.(Saturday June 24th).  2) Decadron.  Take 2 tablets twice daily for 4 days.  Begin day before each chemotherapy cycle.  Dont need to take on day of chemotherapy. We will give it to you IV in infusion room.   3) Compazine 10 mg.  Take 1 tablet by mouth every 6 hours as needed for nausea or vomiting.   If you develop nausea and vomiting that is not controlled by your nausea medication, call the clinic.   BELOW ARE SYMPTOMS THAT SHOULD BE REPORTED IMMEDIATELY:  *FEVER GREATER THAN 100.5 F  *CHILLS WITH OR WITHOUT FEVER  NAUSEA AND VOMITING THAT IS NOT CONTROLLED WITH YOUR NAUSEA MEDICATION  *UNUSUAL SHORTNESS OF BREATH  *UNUSUAL BRUISING OR BLEEDING  TENDERNESS IN MOUTH AND THROAT WITH OR WITHOUT PRESENCE OF ULCERS  *URINARY PROBLEMS  *BOWEL PROBLEMS  UNUSUAL RASH Items with * indicate a potential emergency and should be followed up as soon as possible.  Feel free to call the clinic you have any questions or concerns. The clinic phone number is [phone].  Please show the [facility] at check-in to the Emergency Department and triage nurse.

## 2015-11-11 ENCOUNTER — Encounter: Payer: Self-pay | Admitting: Hematology & Oncology

## 2015-11-11 ENCOUNTER — Ambulatory Visit: Admission: RE | Admit: 2015-11-11 | Payer: Medicare Other | Source: Ambulatory Visit

## 2015-11-11 ENCOUNTER — Ambulatory Visit: Payer: Medicare Other

## 2015-11-12 ENCOUNTER — Ambulatory Visit: Payer: Medicare Other

## 2015-11-12 ENCOUNTER — Telehealth: Payer: Self-pay | Admitting: *Deleted

## 2015-11-12 ENCOUNTER — Ambulatory Visit: Admission: RE | Admit: 2015-11-12 | Payer: Medicare Other | Source: Ambulatory Visit

## 2015-11-12 NOTE — Telephone Encounter (Signed)
Spoke to patient. She is doing well after new chemotherapy on Wednesday. She has no complaints. No n/v, she is eating well and her bowel are working at baseline. She has her prn medications at home if needed.  She has no questions or concerns. She knows to call the office if she has any questions or problems.

## 2015-11-15 ENCOUNTER — Ambulatory Visit: Admission: RE | Admit: 2015-11-15 | Payer: Medicare Other | Source: Ambulatory Visit

## 2015-11-15 ENCOUNTER — Ambulatory Visit: Payer: Medicare Other

## 2015-11-16 ENCOUNTER — Ambulatory Visit: Admission: RE | Admit: 2015-11-16 | Payer: Medicare Other | Source: Ambulatory Visit

## 2015-11-16 ENCOUNTER — Ambulatory Visit: Payer: Medicare Other

## 2015-11-17 ENCOUNTER — Ambulatory Visit: Admission: RE | Admit: 2015-11-17 | Payer: Medicare Other | Source: Ambulatory Visit

## 2015-11-17 ENCOUNTER — Ambulatory Visit: Payer: Medicare Other

## 2015-11-18 ENCOUNTER — Ambulatory Visit: Payer: Medicare Other

## 2015-11-18 ENCOUNTER — Ambulatory Visit: Admission: RE | Admit: 2015-11-18 | Payer: Medicare Other | Source: Ambulatory Visit

## 2015-11-19 ENCOUNTER — Ambulatory Visit: Payer: Medicare Other

## 2015-11-19 ENCOUNTER — Ambulatory Visit: Admission: RE | Admit: 2015-11-19 | Payer: Medicare Other | Source: Ambulatory Visit

## 2015-11-20 HISTORY — PX: BREAST LUMPECTOMY: SHX2

## 2015-11-22 ENCOUNTER — Ambulatory Visit: Payer: Medicare Other

## 2015-11-22 ENCOUNTER — Ambulatory Visit: Admission: RE | Admit: 2015-11-22 | Payer: Medicare Other | Source: Ambulatory Visit

## 2015-11-24 ENCOUNTER — Ambulatory Visit (HOSPITAL_BASED_OUTPATIENT_CLINIC_OR_DEPARTMENT_OTHER): Payer: Medicare Other

## 2015-11-24 ENCOUNTER — Other Ambulatory Visit (HOSPITAL_BASED_OUTPATIENT_CLINIC_OR_DEPARTMENT_OTHER): Payer: Medicare Other

## 2015-11-24 ENCOUNTER — Encounter: Payer: Self-pay | Admitting: Hematology & Oncology

## 2015-11-24 ENCOUNTER — Ambulatory Visit: Payer: Medicare Other

## 2015-11-24 ENCOUNTER — Ambulatory Visit (HOSPITAL_BASED_OUTPATIENT_CLINIC_OR_DEPARTMENT_OTHER): Payer: Medicare Other | Admitting: Hematology & Oncology

## 2015-11-24 ENCOUNTER — Ambulatory Visit: Admission: RE | Admit: 2015-11-24 | Payer: Medicare Other | Source: Ambulatory Visit

## 2015-11-24 VITALS — BP 142/66 | HR 79 | Temp 97.8°F | Resp 16 | Ht 62.0 in | Wt 187.0 lb

## 2015-11-24 DIAGNOSIS — Z171 Estrogen receptor negative status [ER-]: Secondary | ICD-10-CM

## 2015-11-24 DIAGNOSIS — Z5111 Encounter for antineoplastic chemotherapy: Secondary | ICD-10-CM | POA: Diagnosis not present

## 2015-11-24 DIAGNOSIS — C50911 Malignant neoplasm of unspecified site of right female breast: Secondary | ICD-10-CM | POA: Diagnosis not present

## 2015-11-24 DIAGNOSIS — Z5189 Encounter for other specified aftercare: Secondary | ICD-10-CM

## 2015-11-24 LAB — CMP (CANCER CENTER ONLY)
ALBUMIN: 3.5 g/dL (ref 3.3–5.5)
ALT: 47 U/L (ref 10–47)
AST: 26 U/L (ref 11–38)
Alkaline Phosphatase: 89 U/L — ABNORMAL HIGH (ref 26–84)
BILIRUBIN TOTAL: 0.5 mg/dL (ref 0.20–1.60)
BUN, Bld: 47 mg/dL — ABNORMAL HIGH (ref 7–22)
CO2: 23 meq/L (ref 18–33)
Calcium: 8.6 mg/dL (ref 8.0–10.3)
Chloride: 103 mEq/L (ref 98–108)
Creat: 1.5 mg/dl — ABNORMAL HIGH (ref 0.6–1.2)
Glucose, Bld: 199 mg/dL — ABNORMAL HIGH (ref 73–118)
Potassium: 4.2 mEq/L (ref 3.3–4.7)
SODIUM: 132 meq/L (ref 128–145)
Total Protein: 7 g/dL (ref 6.4–8.1)

## 2015-11-24 LAB — CBC WITH DIFFERENTIAL (CANCER CENTER ONLY)
BASO#: 0 10*3/uL (ref 0.0–0.2)
BASO%: 0 % (ref 0.0–2.0)
EOS%: 0 % (ref 0.0–7.0)
Eosinophils Absolute: 0 10*3/uL (ref 0.0–0.5)
HCT: 33 % — ABNORMAL LOW (ref 34.8–46.6)
HEMOGLOBIN: 11.1 g/dL — AB (ref 11.6–15.9)
LYMPH#: 0.9 10*3/uL (ref 0.9–3.3)
LYMPH%: 4.1 % — ABNORMAL LOW (ref 14.0–48.0)
MCH: 30.8 pg (ref 26.0–34.0)
MCHC: 33.6 g/dL (ref 32.0–36.0)
MCV: 92 fL (ref 81–101)
MONO#: 0.9 10*3/uL (ref 0.1–0.9)
MONO%: 4.2 % (ref 0.0–13.0)
NEUT%: 91.7 % — ABNORMAL HIGH (ref 39.6–80.0)
NEUTROS ABS: 20.1 10*3/uL — AB (ref 1.5–6.5)
Platelets: 189 10*3/uL (ref 145–400)
RBC: 3.6 10*6/uL — AB (ref 3.70–5.32)
RDW: 14.7 % (ref 11.1–15.7)
WBC: 22 10*3/uL — AB (ref 3.9–10.0)

## 2015-11-24 MED ORDER — CARBOPLATIN CHEMO INJECTION 450 MG/45ML
350.0000 mg | Freq: Once | INTRAVENOUS | Status: AC
  Administered 2015-11-24: 350 mg via INTRAVENOUS
  Filled 2015-11-24: qty 35

## 2015-11-24 MED ORDER — PEGFILGRASTIM 6 MG/0.6ML ~~LOC~~ PSKT
6.0000 mg | PREFILLED_SYRINGE | Freq: Once | SUBCUTANEOUS | Status: AC
  Administered 2015-11-24: 6 mg via SUBCUTANEOUS

## 2015-11-24 MED ORDER — PALONOSETRON HCL INJECTION 0.25 MG/5ML
INTRAVENOUS | Status: AC
  Filled 2015-11-24: qty 5

## 2015-11-24 MED ORDER — HEPARIN SOD (PORK) LOCK FLUSH 100 UNIT/ML IV SOLN
500.0000 [IU] | Freq: Once | INTRAVENOUS | Status: AC | PRN
  Administered 2015-11-24: 500 [IU]
  Filled 2015-11-24: qty 5

## 2015-11-24 MED ORDER — SODIUM CHLORIDE 0.9 % IV SOLN
Freq: Once | INTRAVENOUS | Status: AC
  Administered 2015-11-24: 14:00:00 via INTRAVENOUS

## 2015-11-24 MED ORDER — PALONOSETRON HCL INJECTION 0.25 MG/5ML
0.2500 mg | Freq: Once | INTRAVENOUS | Status: AC
  Administered 2015-11-24: 0.25 mg via INTRAVENOUS

## 2015-11-24 MED ORDER — DOCETAXEL CHEMO INJECTION 160 MG/16ML
60.0000 mg/m2 | Freq: Once | INTRAVENOUS | Status: AC
  Administered 2015-11-24: 120 mg via INTRAVENOUS
  Filled 2015-11-24: qty 12

## 2015-11-24 MED ORDER — SODIUM CHLORIDE 0.9% FLUSH
10.0000 mL | INTRAVENOUS | Status: DC | PRN
  Administered 2015-11-24: 10 mL
  Filled 2015-11-24: qty 10

## 2015-11-24 MED ORDER — PEGFILGRASTIM 6 MG/0.6ML ~~LOC~~ PSKT
PREFILLED_SYRINGE | SUBCUTANEOUS | Status: AC
  Filled 2015-11-24: qty 0.6

## 2015-11-24 MED ORDER — SODIUM CHLORIDE 0.9 % IV SOLN
10.0000 mg | Freq: Once | INTRAVENOUS | Status: AC
  Administered 2015-11-24: 10 mg via INTRAVENOUS
  Filled 2015-11-24: qty 1

## 2015-11-24 MED ORDER — SODIUM CHLORIDE 0.9% FLUSH
3.0000 mL | INTRAVENOUS | Status: DC | PRN
  Filled 2015-11-24: qty 10

## 2015-11-24 MED ORDER — HEPARIN SOD (PORK) LOCK FLUSH 100 UNIT/ML IV SOLN
250.0000 [IU] | Freq: Once | INTRAVENOUS | Status: DC | PRN
  Filled 2015-11-24: qty 5

## 2015-11-24 MED ORDER — ALTEPLASE 2 MG IJ SOLR
2.0000 mg | Freq: Once | INTRAMUSCULAR | Status: DC | PRN
  Filled 2015-11-24: qty 2

## 2015-11-24 NOTE — Patient Instructions (Signed)
Docetaxel injection  What is this medicine?  DOCETAXEL (doe se TAX el) is a chemotherapy drug. It targets fast dividing cells, like cancer cells, and causes these cells to die. This medicine is used to treat many types of cancers like breast cancer, certain stomach cancers, head and neck cancer, lung cancer, and prostate cancer.  This medicine may be used for other purposes; ask your health care provider or pharmacist if you have questions.  What should I tell my health care provider before I take this medicine?  They need to know if you have any of these conditions:  -infection (especially a virus infection such as chickenpox, cold sores, or herpes)  -liver disease  -low blood counts, like low white cell, platelet, or red cell counts  -an unusual or allergic reaction to docetaxel, polysorbate 80, other chemotherapy agents, other medicines, foods, dyes, or preservatives  -pregnant or trying to get pregnant  -breast-feeding  How should I use this medicine?  This drug is given as an infusion into a vein. It is administered in a hospital or clinic by a specially trained health care professional.  Talk to your pediatrician regarding the use of this medicine in children. Special care may be needed.  Overdosage: If you think you have taken too much of this medicine contact a poison control center or emergency room at once.  NOTE: This medicine is only for you. Do not share this medicine with others.  What if I miss a dose?  It is important not to miss your dose. Call your doctor or health care professional if you are unable to keep an appointment.  What may interact with this medicine?  -cyclosporine  -erythromycin  -ketoconazole  -medicines to increase blood counts like filgrastim, pegfilgrastim, sargramostim  -vaccines  Talk to your doctor or health care professional before taking any of these medicines:  -acetaminophen  -aspirin  -ibuprofen  -ketoprofen  -naproxen  This list may not describe all possible interactions.  Give your health care provider a list of all the medicines, herbs, non-prescription drugs, or dietary supplements you use. Also tell them if you smoke, drink alcohol, or use illegal drugs. Some items may interact with your medicine.  What should I watch for while using this medicine?  Your condition will be monitored carefully while you are receiving this medicine. You will need important blood work done while you are taking this medicine.  This drug may make you feel generally unwell. This is not uncommon, as chemotherapy can affect healthy cells as well as cancer cells. Report any side effects. Continue your course of treatment even though you feel ill unless your doctor tells you to stop.  In some cases, you may be given additional medicines to help with side effects. Follow all directions for their use.  Call your doctor or health care professional for advice if you get a fever, chills or sore throat, or other symptoms of a cold or flu. Do not treat yourself. This drug decreases your body's ability to fight infections. Try to avoid being around people who are sick.  This medicine may increase your risk to bruise or bleed. Call your doctor or health care professional if you notice any unusual bleeding.  This medicine may contain alcohol in the product. You may get drowsy or dizzy. Do not drive, use machinery, or do anything that needs mental alertness until you know how this medicine affects you. Do not stand or sit up quickly, especially if you are an older   patient. This reduces the risk of dizzy or fainting spells. Avoid alcoholic drinks.  Do not become pregnant while taking this medicine. Women should inform their doctor if they wish to become pregnant or think they might be pregnant. There is a potential for serious side effects to an unborn child. Talk to your health care professional or pharmacist for more information. Do not breast-feed an infant while taking this medicine.  What side effects may I notice  from receiving this medicine?  Side effects that you should report to your doctor or health care professional as soon as possible:  -allergic reactions like skin rash, itching or hives, swelling of the face, lips, or tongue  -low blood counts - This drug may decrease the number of white blood cells, red blood cells and platelets. You may be at increased risk for infections and bleeding.  -signs of infection - fever or chills, cough, sore throat, pain or difficulty passing urine  -signs of decreased platelets or bleeding - bruising, pinpoint red spots on the skin, black, tarry stools, nosebleeds  -signs of decreased red blood cells - unusually weak or tired, fainting spells, lightheadedness  -breathing problems  -fast or irregular heartbeat  -low blood pressure  -mouth sores  -nausea and vomiting  -pain, swelling, redness or irritation at the injection site  -pain, tingling, numbness in the hands or feet  -swelling of the ankle, feet, hands  -weight gain  Side effects that usually do not require medical attention (report to your prescriber or health care professional if they continue or are bothersome):  -bone pain  -complete hair loss including hair on your head, underarms, pubic hair, eyebrows, and eyelashes  -diarrhea  -excessive tearing  -changes in the color of fingernails  -loosening of the fingernails  -nausea  -muscle pain  -red flush to skin  -sweating  -weak or tired  This list may not describe all possible side effects. Call your doctor for medical advice about side effects. You may report side effects to FDA at 1-800-FDA-1088.  Where should I keep my medicine?  This drug is given in a hospital or clinic and will not be stored at home.  NOTE: This sheet is a summary. It may not cover all possible information. If you have questions about this medicine, talk to your doctor, pharmacist, or health care provider.      2016, [employer] Standard. (2014-05-25 16:04:57)    Carboplatin injection  What is this  medicine?  CARBOPLATIN (KAR boe pla tin) is a chemotherapy drug. It targets fast dividing cells, like cancer cells, and causes these cells to die. This medicine is used to treat ovarian cancer and many other cancers.  This medicine may be used for other purposes; ask your health care provider or pharmacist if you have questions.  What should I tell my health care provider before I take this medicine?  They need to know if you have any of these conditions:  -blood disorders  -hearing problems  -kidney disease  -recent or ongoing radiation therapy  -an unusual or allergic reaction to carboplatin, cisplatin, other chemotherapy, other medicines, foods, dyes, or preservatives  -pregnant or trying to get pregnant  -breast-feeding  How should I use this medicine?  This drug is usually given as an infusion into a vein. It is administered in a hospital or clinic by a specially trained health care professional.  Talk to your pediatrician regarding the use of this medicine in children. Special care may be needed.  Overdosage:   If you think you have taken too much of this medicine contact a poison control center or emergency room at once.  NOTE: This medicine is only for you. Do not share this medicine with others.  What if I miss a dose?  It is important not to miss a dose. Call your doctor or health care professional if you are unable to keep an appointment.  What may interact with this medicine?  -medicines for seizures  -medicines to increase blood counts like filgrastim, pegfilgrastim, sargramostim  -some antibiotics like amikacin, gentamicin, neomycin, streptomycin, tobramycin  -vaccines  Talk to your doctor or health care professional before taking any of these medicines:  -acetaminophen  -aspirin  -ibuprofen  -ketoprofen  -naproxen  This list may not describe all possible interactions. Give your health care provider a list of all the medicines, herbs, non-prescription drugs, or dietary supplements you use. Also tell them  if you smoke, drink alcohol, or use illegal drugs. Some items may interact with your medicine.  What should I watch for while using this medicine?  Your condition will be monitored carefully while you are receiving this medicine. You will need important blood work done while you are taking this medicine.  This drug may make you feel generally unwell. This is not uncommon, as chemotherapy can affect healthy cells as well as cancer cells. Report any side effects. Continue your course of treatment even though you feel ill unless your doctor tells you to stop.  In some cases, you may be given additional medicines to help with side effects. Follow all directions for their use.  Call your doctor or health care professional for advice if you get a fever, chills or sore throat, or other symptoms of a cold or flu. Do not treat yourself. This drug decreases your body's ability to fight infections. Try to avoid being around people who are sick.  This medicine may increase your risk to bruise or bleed. Call your doctor or health care professional if you notice any unusual bleeding.  Be careful brushing and flossing your teeth or using a toothpick because you may get an infection or bleed more easily. If you have any dental work done, tell your dentist you are receiving this medicine.  Avoid taking products that contain aspirin, acetaminophen, ibuprofen, naproxen, or ketoprofen unless instructed by your doctor. These medicines may hide a fever.  Do not become pregnant while taking this medicine. Women should inform their doctor if they wish to become pregnant or think they might be pregnant. There is a potential for serious side effects to an unborn child. Talk to your health care professional or pharmacist for more information. Do not breast-feed an infant while taking this medicine.  What side effects may I notice from receiving this medicine?  Side effects that you should report to your doctor or health care professional as  soon as possible:  -allergic reactions like skin rash, itching or hives, swelling of the face, lips, or tongue  -signs of infection - fever or chills, cough, sore throat, pain or difficulty passing urine  -signs of decreased platelets or bleeding - bruising, pinpoint red spots on the skin, black, tarry stools, nosebleeds  -signs of decreased red blood cells - unusually weak or tired, fainting spells, lightheadedness  -breathing problems  -changes in hearing  -changes in vision  -chest pain  -high blood pressure  -low blood counts - This drug may decrease the number of white blood cells, red blood cells and platelets. You   at home. NOTE: This sheet is a summary. It may not cover all possible information. If you have questions about this medicine, talk to your doctor, pharmacist, or health care provider.    2016, [employer]/Gold Standard. (2007-08-13 14:38:05)

## 2015-11-24 NOTE — Progress Notes (Signed)
Hematology and Oncology Follow Up Visit  [facility] AS:[id number] Apr 19, 1946 70 y.o. 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 She tolerated her first cycle chemotherapy quite well. She really had very few in the way side effects. She has lost her hair.  She has had no significant nausea or vomiting. She is eating well. She's had no mouth sores. Patient was having some leg cramps but this has gotten better with a combination of vinegar, honey, water.  She has had no rashes. She has had no leg swelling. There's been no bleeding.  Her overall performance status is ECOG 1.  Medications:  Current outpatient prescriptions:  .  dexamethasone (DECADRON) 4 MG tablet, Take 2 tablets twice a day for 4 days.  Start day before each chemotherapy cycle., Disp: 70 tablet, Rfl: 0 .  dexlansoprazole (DEXILANT) 60 MG capsule, Take 60 mg by mouth daily., Disp: , Rfl:  .  hydrochlorothiazide (HYDRODIURIL) 25 MG tablet, Take 25 mg by mouth daily., Disp: , Rfl:  .  HYDROcodone-acetaminophen (NORCO/VICODIN) 5-325 MG tablet, Take 1-2 tablets by mouth every 4 (four) hours as needed for moderate pain or severe pain., Disp: 25 tablet, Rfl: 0 .  lidocaine-prilocaine (EMLA) cream, Apply to affected area once, Disp: 30 g, Rfl: 3 .  metFORMIN (GLUCOPHAGE) 500 MG tablet, Take 500 mg by mouth 2 (two) times daily with a meal. , Disp: , Rfl: 1 .  ondansetron (ZOFRAN) 8 MG tablet, Take 1 tablet (8 mg total) by mouth 2 (two) times daily as needed for refractory nausea / vomiting. Start on day 3 after chemo., Disp: 30 tablet, Rfl: 1 .  prochlorperazine (COMPAZINE) 10 MG tablet, Take 1 tablet (10 mg total) by mouth every 6 (six) hours as needed (Nausea or vomiting)., Disp: 30  tablet, Rfl: 1 .  Vitamin D, Ergocalciferol, (DRISDOL) 50000 units CAPS capsule, Take 50,000 Units by mouth every 7 (seven) days., Disp: , Rfl:  .  Zoledronic Acid (ZOMETA IV), Inject into the vein. Due now, Disp: , Rfl:  .  diltiazem (CARDIZEM CD) 240 MG 24 hr capsule, Take 1 capsule (240 mg total) by mouth daily., Disp: 30 capsule, Rfl: 0  Allergies: No Known Allergies  Past Medical History, Surgical history, Social history, and Family History were reviewed and updated.  Review of Systems: As above  Physical Exam:  height is 5\' 2"  (1.575 m) and weight is 187 lb 0.6 oz (84.841 kg). Her oral temperature is 97.8 F (36.6 C). Her blood pressure is 142/66 and her pulse is 79. Her respiration is 16.   Wt Readings from Last 3 Encounters:  11/24/15 187 lb 0.6 oz (84.841 kg)  10/22/15 186 lb (84.369 kg)  10/14/15 189 lb 3.2 oz (85.821 kg)     Head and neck exam shows no ocular or oral lesions. There are no palpable cervical or supraclavicular lymph nodes. Lungs are clear bilaterally. Cardiac exam regular rate and rhythm with no murmurs, rubs or bruits. Breast exam shows left breast with no masses, edema or erythema. There is no left axillary adenopathy. Right breast shows a healing lumpectomy scar at about the 12:00 position. There is no mass. There is no right axillary adenopathy. Abdomen is soft. She has good bowel sounds. There is no fluid wave  or there is no palpable liver or spleen tip. Back exam shows no tenderness over the spine, ribs or hips. Extremities shows some mild swelling of the right arm. She has good range of motion of the right arm. She has good strength in her joints. Neurological exam shows no focal neurological deficits.  Lab Results  Component Value Date   WBC 22.0* 11/24/2015   HGB 11.1* 11/24/2015   HCT 33.0* 11/24/2015   MCV 92 11/24/2015   PLT 189 11/24/2015     Chemistry      Component Value Date/Time   NA 142 10/22/2015 0950   NA 142 09/09/2015 0933   NA 142  10/27/2012 0143   K 4.2 10/22/2015 0950   K 3.7 09/09/2015 0933   K 4.0 10/27/2012 0143   CL 102 09/09/2015 0933   CL 112 10/27/2012 0143   CO2 24 10/22/2015 0950   CO2 29 09/09/2015 0933   CO2 26 04/03/2012 1109   BUN 14.0 10/22/2015 0950   BUN 18 09/09/2015 0933   BUN 15 10/27/2012 0143   CREATININE 1.6* 10/22/2015 0950   CREATININE 1.4* 09/09/2015 0933   CREATININE 1.60* 10/27/2012 0143      Component Value Date/Time   CALCIUM 9.4 10/22/2015 0950   CALCIUM 9.7 09/09/2015 0933   CALCIUM 9.6 04/03/2012 1109   ALKPHOS 79 10/22/2015 0950   ALKPHOS 86* 09/09/2015 0933   ALKPHOS 101 04/03/2012 1109   AST 35* 10/22/2015 0950   AST 33 09/09/2015 0933   AST 14 04/03/2012 1109   ALT 41 10/22/2015 0950   ALT 38 09/09/2015 0933   ALT 13 04/03/2012 1109   BILITOT 0.30 10/22/2015 0950   BILITOT 0.60 09/09/2015 0933   BILITOT 0.4 04/03/2012 1109         Impression and Plan: [name] is A 70 year old African-American female with a stage Ia triple negative breast cancer of the right breast. She is doing well.  She tolerated her first cycle of chemotherapy nicely.  We will proceed with her second cycle of treatment. The leukocytosis is from her Neulasta.  I still plan for 4 cycles of chemotherapy. I think this is all that she will really need.   [name], MD 7/5/201712:36 PM

## 2015-11-25 ENCOUNTER — Ambulatory Visit: Payer: Medicare Other

## 2015-11-26 ENCOUNTER — Ambulatory Visit: Payer: Medicare Other

## 2015-11-27 ENCOUNTER — Ambulatory Visit: Payer: Medicare Other

## 2015-11-29 ENCOUNTER — Ambulatory Visit: Payer: Medicare Other

## 2015-11-30 ENCOUNTER — Emergency Department (HOSPITAL_COMMUNITY): Payer: Medicare Other

## 2015-11-30 ENCOUNTER — Encounter (HOSPITAL_COMMUNITY): Payer: Self-pay | Admitting: *Deleted

## 2015-11-30 ENCOUNTER — Ambulatory Visit: Payer: Medicare Other

## 2015-11-30 ENCOUNTER — Inpatient Hospital Stay (HOSPITAL_COMMUNITY)
Admission: EM | Admit: 2015-11-30 | Discharge: 2015-12-03 | DRG: 202 | Disposition: A | Discharge status: Home / Self Care | Payer: Medicare Other | Attending: Internal Medicine | Admitting: Internal Medicine

## 2015-11-30 DIAGNOSIS — D638 Anemia in other chronic diseases classified elsewhere: Secondary | ICD-10-CM | POA: Diagnosis present

## 2015-11-30 DIAGNOSIS — B373 Candidiasis of vulva and vagina: Secondary | ICD-10-CM | POA: Diagnosis present

## 2015-11-30 DIAGNOSIS — Z96652 Presence of left artificial knee joint: Secondary | ICD-10-CM | POA: Diagnosis present

## 2015-11-30 DIAGNOSIS — Z808 Family history of malignant neoplasm of other organs or systems: Secondary | ICD-10-CM | POA: Diagnosis not present

## 2015-11-30 DIAGNOSIS — R0602 Shortness of breath: Secondary | ICD-10-CM | POA: Diagnosis not present

## 2015-11-30 DIAGNOSIS — J9811 Atelectasis: Secondary | ICD-10-CM | POA: Diagnosis present

## 2015-11-30 DIAGNOSIS — R300 Dysuria: Secondary | ICD-10-CM | POA: Diagnosis present

## 2015-11-30 DIAGNOSIS — D701 Agranulocytosis secondary to cancer chemotherapy: Secondary | ICD-10-CM | POA: Diagnosis present

## 2015-11-30 DIAGNOSIS — A044 Other intestinal Escherichia coli infections: Secondary | ICD-10-CM | POA: Diagnosis present

## 2015-11-30 DIAGNOSIS — D0511 Intraductal carcinoma in situ of right breast: Secondary | ICD-10-CM | POA: Diagnosis present

## 2015-11-30 DIAGNOSIS — D61810 Antineoplastic chemotherapy induced pancytopenia: Secondary | ICD-10-CM | POA: Diagnosis present

## 2015-11-30 DIAGNOSIS — C50919 Malignant neoplasm of unspecified site of unspecified female breast: Secondary | ICD-10-CM | POA: Diagnosis present

## 2015-11-30 DIAGNOSIS — Z923 Personal history of irradiation: Secondary | ICD-10-CM

## 2015-11-30 DIAGNOSIS — J209 Acute bronchitis, unspecified: Principal | ICD-10-CM | POA: Diagnosis present

## 2015-11-30 DIAGNOSIS — R059 Cough, unspecified: Secondary | ICD-10-CM

## 2015-11-30 DIAGNOSIS — C50911 Malignant neoplasm of unspecified site of right female breast: Secondary | ICD-10-CM | POA: Diagnosis not present

## 2015-11-30 DIAGNOSIS — R630 Anorexia: Secondary | ICD-10-CM

## 2015-11-30 DIAGNOSIS — Z800 Family history of malignant neoplasm of digestive organs: Secondary | ICD-10-CM | POA: Diagnosis not present

## 2015-11-30 DIAGNOSIS — N183 Chronic kidney disease, stage 3 unspecified: Secondary | ICD-10-CM

## 2015-11-30 DIAGNOSIS — Z8261 Family history of arthritis: Secondary | ICD-10-CM | POA: Diagnosis not present

## 2015-11-30 DIAGNOSIS — R5081 Fever presenting with conditions classified elsewhere: Secondary | ICD-10-CM | POA: Diagnosis not present

## 2015-11-30 DIAGNOSIS — I129 Hypertensive chronic kidney disease with stage 1 through stage 4 chronic kidney disease, or unspecified chronic kidney disease: Secondary | ICD-10-CM | POA: Diagnosis present

## 2015-11-30 DIAGNOSIS — Z7952 Long term (current) use of systemic steroids: Secondary | ICD-10-CM

## 2015-11-30 DIAGNOSIS — N1832 Chronic kidney disease, stage 3b: Secondary | ICD-10-CM

## 2015-11-30 DIAGNOSIS — Z8579 Personal history of other malignant neoplasms of lymphoid, hematopoietic and related tissues: Secondary | ICD-10-CM | POA: Diagnosis not present

## 2015-11-30 DIAGNOSIS — K219 Gastro-esophageal reflux disease without esophagitis: Secondary | ICD-10-CM | POA: Diagnosis present

## 2015-11-30 DIAGNOSIS — Y95 Nosocomial condition: Secondary | ICD-10-CM | POA: Diagnosis present

## 2015-11-30 DIAGNOSIS — T451X5A Adverse effect of antineoplastic and immunosuppressive drugs, initial encounter: Secondary | ICD-10-CM | POA: Diagnosis present

## 2015-11-30 DIAGNOSIS — Z8249 Family history of ischemic heart disease and other diseases of the circulatory system: Secondary | ICD-10-CM

## 2015-11-30 DIAGNOSIS — B3731 Acute candidiasis of vulva and vagina: Secondary | ICD-10-CM

## 2015-11-30 DIAGNOSIS — B3749 Other urogenital candidiasis: Secondary | ICD-10-CM

## 2015-11-30 DIAGNOSIS — B002 Herpesviral gingivostomatitis and pharyngotonsillitis: Secondary | ICD-10-CM | POA: Diagnosis present

## 2015-11-30 DIAGNOSIS — Z79899 Other long term (current) drug therapy: Secondary | ICD-10-CM

## 2015-11-30 DIAGNOSIS — Z801 Family history of malignant neoplasm of trachea, bronchus and lung: Secondary | ICD-10-CM

## 2015-11-30 DIAGNOSIS — Z86010 Personal history of colonic polyps: Secondary | ICD-10-CM

## 2015-11-30 DIAGNOSIS — R634 Abnormal weight loss: Secondary | ICD-10-CM

## 2015-11-30 DIAGNOSIS — J189 Pneumonia, unspecified organism: Secondary | ICD-10-CM | POA: Diagnosis not present

## 2015-11-30 DIAGNOSIS — E860 Dehydration: Secondary | ICD-10-CM | POA: Diagnosis present

## 2015-11-30 DIAGNOSIS — E785 Hyperlipidemia, unspecified: Secondary | ICD-10-CM | POA: Diagnosis present

## 2015-11-30 DIAGNOSIS — R05 Cough: Secondary | ICD-10-CM

## 2015-11-30 DIAGNOSIS — E43 Unspecified severe protein-calorie malnutrition: Secondary | ICD-10-CM | POA: Diagnosis present

## 2015-11-30 DIAGNOSIS — R197 Diarrhea, unspecified: Secondary | ICD-10-CM

## 2015-11-30 DIAGNOSIS — D649 Anemia, unspecified: Secondary | ICD-10-CM | POA: Diagnosis not present

## 2015-11-30 DIAGNOSIS — M1990 Unspecified osteoarthritis, unspecified site: Secondary | ICD-10-CM | POA: Diagnosis present

## 2015-11-30 DIAGNOSIS — A6000 Herpesviral infection of urogenital system, unspecified: Secondary | ICD-10-CM | POA: Diagnosis present

## 2015-11-30 DIAGNOSIS — Z833 Family history of diabetes mellitus: Secondary | ICD-10-CM

## 2015-11-30 DIAGNOSIS — C9001 Multiple myeloma in remission: Secondary | ICD-10-CM

## 2015-11-30 DIAGNOSIS — D709 Neutropenia, unspecified: Secondary | ICD-10-CM

## 2015-11-30 DIAGNOSIS — B009 Herpesviral infection, unspecified: Secondary | ICD-10-CM

## 2015-11-30 DIAGNOSIS — E611 Iron deficiency: Secondary | ICD-10-CM | POA: Diagnosis present

## 2015-11-30 DIAGNOSIS — D6959 Other secondary thrombocytopenia: Secondary | ICD-10-CM | POA: Diagnosis not present

## 2015-11-30 DIAGNOSIS — Z7984 Long term (current) use of oral hypoglycemic drugs: Secondary | ICD-10-CM

## 2015-11-30 DIAGNOSIS — B372 Candidiasis of skin and nail: Secondary | ICD-10-CM | POA: Diagnosis not present

## 2015-11-30 DIAGNOSIS — R079 Chest pain, unspecified: Secondary | ICD-10-CM | POA: Diagnosis not present

## 2015-11-30 LAB — DIFFERENTIAL
BASOS ABS: 0 10*3/uL (ref 0.0–0.1)
Basophils Relative: 2 %
Eosinophils Absolute: 0 10*3/uL (ref 0.0–0.7)
Eosinophils Relative: 3 %
LYMPHS PCT: 67 %
Lymphs Abs: 0.4 10*3/uL — ABNORMAL LOW (ref 0.7–4.0)
MONOS PCT: 13 %
Monocytes Absolute: 0.1 10*3/uL (ref 0.1–1.0)
Neutro Abs: 0.1 10*3/uL — ABNORMAL LOW (ref 1.7–7.7)
Neutrophils Relative %: 15 %

## 2015-11-30 LAB — BASIC METABOLIC PANEL
ANION GAP: 10 (ref 5–15)
BUN: 29 mg/dL — ABNORMAL HIGH (ref 6–20)
CALCIUM: 9 mg/dL (ref 8.9–10.3)
CO2: 20 mmol/L — ABNORMAL LOW (ref 22–32)
CREATININE: 1.58 mg/dL — AB (ref 0.44–1.00)
Chloride: 106 mmol/L (ref 101–111)
GFR, EST AFRICAN AMERICAN: 38 mL/min — AB (ref >60)
GFR, EST NON AFRICAN AMERICAN: 33 mL/min — AB (ref >60)
Glucose, Bld: 168 mg/dL — ABNORMAL HIGH (ref 65–99)
Potassium: 4.2 mmol/L (ref 3.5–5.1)
Sodium: 136 mmol/L (ref 135–145)

## 2015-11-30 LAB — HEPATIC FUNCTION PANEL
ALBUMIN: 3.1 g/dL — AB (ref 3.5–5.0)
ALT: 44 U/L (ref 14–54)
AST: 31 U/L (ref 15–41)
Alkaline Phosphatase: 93 U/L (ref 38–126)
Bilirubin, Direct: 0.3 mg/dL (ref 0.1–0.5)
Indirect Bilirubin: 0.7 mg/dL (ref 0.3–0.9)
TOTAL PROTEIN: 7.4 g/dL (ref 6.5–8.1)
Total Bilirubin: 1 mg/dL (ref 0.3–1.2)

## 2015-11-30 LAB — CBC
HCT: 27.9 % — ABNORMAL LOW (ref 36.0–46.0)
HEMOGLOBIN: 9.3 g/dL — AB (ref 12.0–15.0)
MCH: 30 pg (ref 26.0–34.0)
MCHC: 33.3 g/dL (ref 30.0–36.0)
MCV: 90 fL (ref 78.0–100.0)
PLATELETS: 51 10*3/uL — AB (ref 150–400)
RBC: 3.1 MIL/uL — AB (ref 3.87–5.11)
RDW: 15.3 % (ref 11.5–15.5)
WBC: 0.6 10*3/uL — CL (ref 4.0–10.5)

## 2015-11-30 LAB — I-STAT CG4 LACTIC ACID, ED: Lactic Acid, Venous: 2.13 mmol/L (ref 0.5–1.9)

## 2015-11-30 LAB — GLUCOSE, CAPILLARY: GLUCOSE-CAPILLARY: 163 mg/dL — AB (ref 65–99)

## 2015-11-30 LAB — I-STAT TROPONIN, ED: TROPONIN I, POC: 0 ng/mL (ref 0.00–0.08)

## 2015-11-30 MED ORDER — SODIUM CHLORIDE 0.9 % IV BOLUS (SEPSIS)
1000.0000 mL | Freq: Once | INTRAVENOUS | Status: AC
  Administered 2015-11-30: 1000 mL via INTRAVENOUS

## 2015-11-30 MED ORDER — DEXTROSE 5 % IV SOLN
1.0000 g | Freq: Two times a day (BID) | INTRAVENOUS | Status: DC
  Administered 2015-12-01 (×2): 1 g via INTRAVENOUS
  Filled 2015-11-30 (×4): qty 1

## 2015-11-30 MED ORDER — VANCOMYCIN HCL IN DEXTROSE 1-5 GM/200ML-% IV SOLN
1000.0000 mg | INTRAVENOUS | Status: DC
  Administered 2015-12-01: 1000 mg via INTRAVENOUS
  Filled 2015-11-30: qty 200

## 2015-11-30 MED ORDER — VANCOMYCIN HCL IN DEXTROSE 1-5 GM/200ML-% IV SOLN
1000.0000 mg | Freq: Once | INTRAVENOUS | Status: AC
  Administered 2015-11-30: 1000 mg via INTRAVENOUS
  Filled 2015-11-30: qty 200

## 2015-11-30 MED ORDER — DEXTROSE 5 % IV SOLN
1.0000 g | Freq: Once | INTRAVENOUS | Status: AC
  Administered 2015-11-30: 1 g via INTRAVENOUS
  Filled 2015-11-30: qty 1

## 2015-11-30 MED ORDER — PROCHLORPERAZINE MALEATE 10 MG PO TABS
10.0000 mg | ORAL_TABLET | Freq: Four times a day (QID) | ORAL | Status: DC | PRN
  Filled 2015-11-30: qty 1

## 2015-11-30 MED ORDER — FLUCONAZOLE 100 MG PO TABS
150.0000 mg | ORAL_TABLET | ORAL | Status: DC
  Administered 2015-11-30: 150 mg via ORAL
  Filled 2015-11-30: qty 1

## 2015-11-30 MED ORDER — ONDANSETRON HCL 4 MG PO TABS: 8.0000 mg | ORAL_TABLET | Freq: Two times a day (BID) | ORAL | Status: DC | PRN

## 2015-11-30 MED ORDER — PANTOPRAZOLE SODIUM 40 MG PO TBEC
80.0000 mg | DELAYED_RELEASE_TABLET | Freq: Every day | ORAL | Status: DC
  Administered 2015-12-01 – 2015-12-02 (×2): 80 mg via ORAL
  Filled 2015-11-30 (×2): qty 2

## 2015-11-30 MED ORDER — SODIUM CHLORIDE 0.9 % IV SOLN
INTRAVENOUS | Status: DC
  Administered 2015-11-30 – 2015-12-02 (×6): via INTRAVENOUS

## 2015-11-30 MED ORDER — MENTHOL 3 MG MT LOZG
1.0000 | LOZENGE | OROMUCOSAL | Status: DC | PRN
  Administered 2015-11-30: 3 mg via ORAL
  Filled 2015-11-30: qty 9

## 2015-11-30 MED ORDER — ACYCLOVIR 400 MG PO TABS
400.0000 mg | ORAL_TABLET | Freq: Three times a day (TID) | ORAL | Status: DC
  Administered 2015-11-30 – 2015-12-02 (×7): 400 mg via ORAL
  Filled 2015-11-30 (×8): qty 1

## 2015-11-30 MED ORDER — HYDROCODONE-ACETAMINOPHEN 5-325 MG PO TABS: 1.0000 | ORAL_TABLET | ORAL | Status: DC | PRN

## 2015-11-30 NOTE — H&P (Signed)
History and Physical    [place] WKG:[id number] DOB: Apr 03, 1946 DOA: 11/30/2015   PCP: [name], MD Chief Complaint:  Chief Complaint  Patient presents with  . Chest Pain  . Shortness of Breath  . chemo pt     HPI: [name] is a 70 y.o. female with medical history significant of BRCA stage 1, triple negative so undergoing chemotherapy, got radiation therapy.  Just completed cycle 2 on the 5th of July.  Patient presents to ED with cough, congestion, SOB.  Symptoms onset since getting her chemo, symptoms worsening, nothing makes them better or worse, no associated fever.  Also has yeast infection and oral herpes outbreak.  Decreased PO intake due to PO pain.  ED Course: Found to have PNA on X ray, neutropenic and thrombocytopenic.  Tachycardic.  Review of Systems: As per HPI otherwise 10 point review of systems negative.    Past Medical History  Diagnosis Date  . Degenerative joint disease involving multiple joints 03/29/2011  . 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 Anemia   . GERD (gastroesophageal reflux disease)   . Vitamin D deficiency   . Hyperlipidemia   . Paget's bone disease   . History of multiple myeloma     chemo XRT, Dr. Marin Olp  . DJD (degenerative joint disease)   . OA (osteoarthritis)   . Renal insufficiency     H/O resolved  . Herpes simplex esophagitis 2006  . HTN (hypertension) 2005  . Chronic pain of left knee   . History of blood transfusion     with first pregnancy  . Myeloma ([place]) 03/28/2011  . Personal history of adenomatous colonic polyps 11/02/2011    2 diminutive serrated adenomas 10/2011 Repeat colonoscopy 2018 (change from original recall based upon current guidelines 12/07/2014)    . Breast cancer, stage 1 ([place]) 09/09/2015  . PONV (postoperative nausea and vomiting)     Past Surgical History  Procedure Laterality Date  . 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 Knee arthroscopy  05/2008    left  . Total knee arthroplasty  10/11/2010    left  . Joint replacement Left   . Breast lumpectomy with radioactive seed and sentinel lymph node biopsy Right 09/22/2015    Procedure: BREAST LUMPECTOMY WITH RADIOACTIVE SEED AND SENTINEL LYMPH NODE BIOPSY;  Surgeon: Excell Seltzer, MD;  Location: [place];  Service: General;  Laterality: Right;     reports that she has never smoked. She has never used smokeless tobacco. She reports that she does not drink alcohol or use illicit drugs.  No Known Allergies  Family History  Problem Relation Age of Onset  . Colon cancer Neg Hx   . Lung cancer Father     smoker  . Throat cancer Brother   . Heart attack Mother   . Diabetes Mother   . Diabetes Sister   . Liver cancer Brother   . Pancreatic cancer Sister   . Heart attack Mother   . Rheum arthritis Sister       Prior to Admission medications   Medication Sig Start Date End Date Taking? Authorizing Provider  dexamethasone (DECADRON) 4 MG tablet Take 2 tablets twice a day for 4 days.  Start day before each chemotherapy cycle. 10/25/15  Yes [name], MD  dexlansoprazole (DEXILANT) 60 MG capsule Take 60 mg by mouth daily.  Yes Historical Provider, MD  diltiazem (CARDIZEM CD) 240 MG 24 hr capsule Take 1 capsule (240 mg total) by mouth daily. 10/27/12  Yes Junius Creamer, NP  hydrochlorothiazide (HYDRODIURIL) 25 MG tablet Take 25 mg by mouth daily.   Yes Historical Provider, MD  HYDROcodone-acetaminophen (NORCO/VICODIN) 5-325 MG tablet Take 1-2 tablets by mouth every 4 (four) hours as needed for moderate pain or severe pain. 09/22/15  Yes Excell Seltzer, MD  lidocaine-prilocaine (EMLA) cream Apply to affected area once 11/10/15  Yes [name], MD  metFORMIN (GLUCOPHAGE) 500 MG tablet Take 500 mg by mouth 2 (two) times daily with a meal.  08/29/14  Yes Historical Provider, MD  ondansetron (ZOFRAN) 8 MG tablet Take 1 tablet (8 mg  total) by mouth 2 (two) times daily as needed for refractory nausea / vomiting. Start on day 3 after chemo. 11/10/15  Yes [name], MD  prochlorperazine (COMPAZINE) 10 MG tablet Take 1 tablet (10 mg total) by mouth every 6 (six) hours as needed (Nausea or vomiting). 11/10/15  Yes [name], MD  Vitamin D, Ergocalciferol, (DRISDOL) 50000 units CAPS capsule Take 50,000 Units by mouth every 7 (seven) days.   Yes Historical Provider, MD  Zoledronic Acid (ZOMETA IV) Inject into the vein. Due now   Yes Historical Provider, MD    Physical Exam: Filed Vitals:   11/30/15 1605 11/30/15 1951 11/30/15 2149  BP: 135/87 114/74 145/77  Pulse: 125 116 119  Temp: 97.8 F (36.6 C)    TempSrc: Oral    Resp: _0 Height: _1  (1.626 m)    Weight: 77.111 kg (170 lb)    SpO2: 98% 97% 97%      Constitutional: NAD, calm, comfortable Eyes: PERRL, lids and conjunctivae normal ENMT: Mucous membranes are moist. Posterior pharynx clear of any exudate or lesions.Normal dentition.  Neck: normal, supple, no masses, no thyromegaly Respiratory: clear to auscultation bilaterally, no wheezing, no crackles. Normal respiratory effort. No accessory muscle use.  Cardiovascular: Regular rate and rhythm, no murmurs / rubs / gallops. No extremity edema. 2+ pedal pulses. No carotid bruits.  Abdomen: no tenderness, no masses palpated. No hepatosplenomegaly. Bowel sounds positive.  Musculoskeletal: no clubbing / cyanosis. No joint deformity upper and lower extremities. Good ROM, no contractures. Normal muscle tone.  Skin: no rashes, lesions, ulcers. No induration Neurologic: CN 2-12 grossly intact. Sensation intact, DTR normal. Strength 5/5 in all 4.  Psychiatric: Normal judgment and insight. Alert and oriented x 3. Normal mood.    Labs on Admission: I have personally reviewed following labs and imaging studies  CBC:  Recent Labs Lab 11/24/15 1150 11/30/15 1839  WBC 22.0* 0.6*  NEUTROABS 20.1* 0.1*    HGB 11.1* 9.3*  HCT 33.0* 27.9*  MCV 92 90.0  PLT 189 51*   Basic Metabolic Panel:  Recent Labs Lab 11/24/15 1150 11/30/15 1839  NA 132 136  K 4.2 4.2  CL 103 106  CO2 23 20*  GLUCOSE 199* 168*  BUN 47* 29*  CREATININE 1.5* 1.58*  CALCIUM 8.6 9.0   GFR: Estimated Creatinine Clearance: 34.3 mL/min (by C-G formula based on Cr of 1.58). Liver Function Tests:  Recent Labs Lab 11/24/15 1150 11/30/15 1839  AST 26 31  ALT 47 44  ALKPHOS 89* 93  BILITOT 0.50 1.0  PROT 7.0 7.4  ALBUMIN 3.5 3.1*   No results for input(s): LIPASE, AMYLASE in the last 168 hours. No results for input(s): AMMONIA in the last 168  hours. Coagulation Profile: No results for input(s): INR, PROTIME in the last 168 hours. Cardiac Enzymes: No results for input(s): CKTOTAL, CKMB, CKMBINDEX, TROPONINI in the last 168 hours. BNP (last 3 results) No results for input(s): PROBNP in the last 8760 hours. HbA1C: No results for input(s): HGBA1C in the last 72 hours. CBG: No results for input(s): GLUCAP in the last 168 hours. Lipid Profile: No results for input(s): CHOL, HDL, LDLCALC, TRIG, CHOLHDL, LDLDIRECT in the last 72 hours. Thyroid Function Tests: No results for input(s): TSH, T4TOTAL, FREET4, T3FREE, THYROIDAB in the last 72 hours. Anemia Panel: No results for input(s): VITAMINB12, FOLATE, FERRITIN, TIBC, IRON, RETICCTPCT in the last 72 hours. Urine analysis:    Component Value Date/Time   COLORURINE YELLOW 10/20/2009 1857   APPEARANCEUR CLOUDY* 10/20/2009 1857   LABSPEC 1.025 10/20/2009 1857   PHURINE 5.5 10/20/2009 1857   GLUCOSEU NEGATIVE 10/20/2009 1857   HGBUR NEGATIVE 10/20/2009 1857   BILIRUBINUR NEG 05/31/2012 1047   BILIRUBINUR NEGATIVE 10/20/2009 1857   KETONESUR TRACE* 10/20/2009 1857   PROTEINUR NEG 05/31/2012 1047   PROTEINUR NEGATIVE 10/20/2009 1857   UROBILINOGEN 0.2 05/31/2012 1047   UROBILINOGEN 1.0 10/20/2009 1857   NITRITE POSITIVE 05/31/2012 1047   NITRITE NEGATIVE  10/20/2009 1857   LEUKOCYTESUR small (1+) 05/31/2012 1047   Sepsis Labs: _0 (procalcitonin:4,lacticidven:4) )No results found for this or any previous visit (from the past 240 hour(s)).   Radiological Exams on Admission: Dg Chest 2 View  11/30/2015  CLINICAL DATA:  Worsening chest pain and shortness of breath. Breast cancer. Receiving chemotherapy. EXAM: CHEST  2 VIEW COMPARISON:  10/29/2015, 10/27/2012 FINDINGS: Postop changes over the right breast with surgical clips noted. Left subclavian power port catheter tip proximal SVC as before. Normal heart size and vascularity. Minimal left basilar streaky opacity, atelectasis is favored over pneumonia. Right lung remains clear. Artifact over the right apex. No edema, significant effusion or pneumothorax. Trachea is midline. Degenerative changes of the spine and left shoulder. IMPRESSION: Streaky left lower lobe opacity compatible with atelectasis, less likely developing pneumonia. Stable postoperative findings as above Electronically Signed   By: Jerilynn Mages.  Shick M.D.   On: 11/30/2015 16:57    EKG: Independently reviewed.  Assessment/Plan Principal Problem:   Pneumonia Active Problems:   Breast cancer, stage 1 (H[facility])   Chemotherapy induced neutropenia (H[facility])   Chemotherapy induced thrombocytopenia    PNA - complicated by neutropenia  PNA pathway  Cefepime and vanc  Repeat labs in AM  Re Check lactate at midnight  Cultures pending  Oncology consulted due to neutropenia in chemo patient  Neutropenic precautions  Yeast infection - PO fluconazole Q72H  Oral HSV sores - history of same  Acyclovir per pharm consult  DM2 -  Hold metformin  CBG checks AC/HS  Add SSI if needed  DVT prophylaxis: SCDs only due to thrombocytopenia Code Status: Full Family Communication: Family at bedside Consults called: Spoke with Dr. Gwenlyn Perking,  Admission status: Admit to inpatient   Etta Quill DO [facility] Pager [phone] from  7PM-7AM  If 7AM-7PM, please contact the day physician for the patient [email or web address] Password [email or web address]  11/30/2015, 9:50 PM

## 2015-11-30 NOTE — ED Notes (Signed)
Pt complains of chest pain, shortness of breath since receiving her first chemo treatment on 6/21 that became worse today. Pt states she also noticed a burning sensation while urinating

## 2015-11-30 NOTE — ED Provider Notes (Signed)
CSN: [id number]     Arrival date & time 11/30/15  1555 History   First MD Initiated Contact with Patient 11/30/15 1907     Chief Complaint  Patient presents with  . Chest Pain  . Shortness of Breath  . chemo pt       Patient is a 70 y.o. female presenting with chest pain and shortness of breath. The history is provided by the patient.  Chest Pain Associated symptoms: cough, fatigue, nausea and shortness of breath   Associated symptoms: no abdominal pain and no numbness   Shortness of Breath Associated symptoms: chest pain and cough   Associated symptoms: no abdominal pain and no neck pain   Patient presents with shortness of breath cough and generalized weakness. She is on chemotherapy for breast cancer and had her infusion 6 days ago. Denies fevers. Has had nausea that is controlled medications. She's had diarrhea. She states she has some lesions in her mouth. States she also has a vaginal yeast infection. Slight dull chest pain. States she's been coughing up sputum. Slight upper abdominal pain also. This is her second round of chemotherapy. States she tolerated the first one rather well. No swelling in her legs. Chest pain is dull.    Past Medical History  Diagnosis Date  . Degenerative joint disease involving multiple joints 03/29/2011  . 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 Anemia   . GERD (gastroesophageal reflux disease)   . Vitamin D deficiency   . Hyperlipidemia   . Paget's bone disease   . History of multiple myeloma     chemo XRT, Dr. Marin Olp  . DJD (degenerative joint disease)   . OA (osteoarthritis)   . Renal insufficiency     H/O resolved  . Herpes simplex esophagitis 2006  . HTN (hypertension) 2005  . Chronic pain of left knee   . History of blood transfusion     with first pregnancy  . Myeloma ([place]) 03/28/2011  . Personal history of adenomatous colonic polyps 11/02/2011    2 diminutive serrated adenomas 10/2011 Repeat colonoscopy  2018 (change from original recall based upon current guidelines 12/07/2014)    . Breast cancer, stage 1 ([place]) 09/09/2015  . PONV (postoperative nausea and vomiting)    Past Surgical History  Procedure Laterality Date  . 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 Knee arthroscopy  05/2008    left  . Total knee arthroplasty  10/11/2010    left  . Joint replacement Left   . Breast lumpectomy with radioactive seed and sentinel lymph node biopsy Right 09/22/2015    Procedure: BREAST LUMPECTOMY WITH RADIOACTIVE SEED AND SENTINEL LYMPH NODE BIOPSY;  Surgeon: Excell Seltzer, MD;  Location: [place];  Service: General;  Laterality: Right;   Family History  Problem Relation Age of Onset  . Colon cancer Neg Hx   . Lung cancer Father     smoker  . Throat cancer Brother   . Heart attack Mother   . Diabetes Mother   . Diabetes Sister   . Liver cancer Brother   . Pancreatic cancer Sister   . Heart attack Mother   . Rheum arthritis Sister    Social History  Substance Use Topics  . Smoking status: Never Smoker   . Smokeless tobacco: Never Used     Comment: never used tobacco  . Alcohol Use: No  OB History    Gravida Para Term Preterm AB TAB SAB Ectopic Multiple Living   '3 3        3     ' Review of Systems  Constitutional: Positive for appetite change and fatigue.  Respiratory: Positive for cough and shortness of breath.   Cardiovascular: Positive for chest pain.  Gastrointestinal: Positive for nausea and diarrhea. Negative for abdominal pain.  Genitourinary: Positive for vaginal discharge.  Musculoskeletal: Negative for neck pain.  Skin: Negative for wound.  Neurological: Negative for numbness.  Hematological: Negative for adenopathy.      Allergies  Review of patient's allergies indicates no known allergies.  Home Medications   Prior to Admission medications   Medication Sig Start Date End Date Taking?  Authorizing Provider  dexamethasone (DECADRON) 4 MG tablet Take 2 tablets twice a day for 4 days.  Start day before each chemotherapy cycle. 10/25/15  Yes [name], MD  dexlansoprazole (DEXILANT) 60 MG capsule Take 60 mg by mouth daily.   Yes Historical Provider, MD  diltiazem (CARDIZEM CD) 240 MG 24 hr capsule Take 1 capsule (240 mg total) by mouth daily. 10/27/12  Yes Junius Creamer, NP  hydrochlorothiazide (HYDRODIURIL) 25 MG tablet Take 25 mg by mouth daily.   Yes Historical Provider, MD  HYDROcodone-acetaminophen (NORCO/VICODIN) 5-325 MG tablet Take 1-2 tablets by mouth every 4 (four) hours as needed for moderate pain or severe pain. 09/22/15  Yes Excell Seltzer, MD  lidocaine-prilocaine (EMLA) cream Apply to affected area once 11/10/15  Yes [name], MD  metFORMIN (GLUCOPHAGE) 500 MG tablet Take 500 mg by mouth 2 (two) times daily with a meal.  08/29/14  Yes Historical Provider, MD  ondansetron (ZOFRAN) 8 MG tablet Take 1 tablet (8 mg total) by mouth 2 (two) times daily as needed for refractory nausea / vomiting. Start on day 3 after chemo. 11/10/15  Yes [name], MD  prochlorperazine (COMPAZINE) 10 MG tablet Take 1 tablet (10 mg total) by mouth every 6 (six) hours as needed (Nausea or vomiting). 11/10/15  Yes [name], MD  Vitamin D, Ergocalciferol, (DRISDOL) 50000 units CAPS capsule Take 50,000 Units by mouth every 7 (seven) days.   Yes Historical Provider, MD  Zoledronic Acid (ZOMETA IV) Inject into the vein. Due now   Yes Historical Provider, MD   BP 114/74 mmHg  Pulse 116  Temp(Src) 97.8 F (36.6 C) (Oral)  Resp 25  Ht '5\' 4"'  (1.626 m)  Wt 170 lb (77.111 kg)  BMI 29.17 kg/m2  SpO2 97%  LMP 05/22/2000 Physical Exam  Constitutional: She appears well-developed.  HENT:  Some scattered round lesions in mouth and on lips.  Eyes: EOM are normal.  Neck: Neck supple.  Cardiovascular:  Tachycardia  Pulmonary/Chest:  Rales to bilateral bases.  Abdominal: Soft.  Mild  upper abdominal tenderness without rebound or guarding.  Musculoskeletal: She exhibits no edema.  Neurological: She is alert.  Skin: Skin is warm.  White vesicular lesions in back of throat.  ED Course  Procedures (including critical care time) Labs Review Labs Reviewed  BASIC METABOLIC PANEL - Abnormal; Notable for the following:    CO2 20 (*)    Glucose, Bld 168 (*)    BUN 29 (*)    Creatinine, Ser 1.58 (*)    GFR calc non Af Amer 33 (*)    GFR calc Af Amer 38 (*)    All other components within normal limits  CBC - Abnormal; Notable for the  following:    WBC 0.6 (*)    RBC 3.10 (*)    Hemoglobin 9.3 (*)    HCT 27.9 (*)    Platelets 51 (*)    All other components within normal limits  HEPATIC FUNCTION PANEL - Abnormal; Notable for the following:    Albumin 3.1 (*)    All other components within normal limits  DIFFERENTIAL - Abnormal; Notable for the following:    Neutro Abs 0.1 (*)    Lymphs Abs 0.4 (*)    All other components within normal limits  I-STAT CG4 LACTIC ACID, ED - Abnormal; Notable for the following:    Lactic Acid, Venous 2.13 (*)    All other components within normal limits  CULTURE, BLOOD (ROUTINE X 2)  CULTURE, BLOOD (ROUTINE X 2)  I-STAT TROPOININ, ED    Imaging Review Dg Chest 2 View  11/30/2015  CLINICAL DATA:  Worsening chest pain and shortness of breath. Breast cancer. Receiving chemotherapy. EXAM: CHEST  2 VIEW COMPARISON:  10/29/2015, 10/27/2012 FINDINGS: Postop changes over the right breast with surgical clips noted. Left subclavian power port catheter tip proximal SVC as before. Normal heart size and vascularity. Minimal left basilar streaky opacity, atelectasis is favored over pneumonia. Right lung remains clear. Artifact over the right apex. No edema, significant effusion or pneumothorax. Trachea is midline. Degenerative changes of the spine and left shoulder. IMPRESSION: Streaky left lower lobe opacity compatible with atelectasis, less likely  developing pneumonia. Stable postoperative findings as above Electronically Signed   By: Jerilynn Mages.  Shick M.D.   On: 11/30/2015 16:57   I have personally reviewed and evaluated these images and lab results as part of my medical decision-making.   EKG Interpretation   Date/Time:  Tuesday November 30 2015 16:34:03 EDT Ventricular Rate:  123 PR Interval:  154 QRS Duration: 74 QT Interval:  312 QTC Calculation: 446 R Axis:   1 Text Interpretation:  Sinus tachycardia Possible Left atrial enlargement  Cannot rule out Anterior infarct , age undetermined Abnormal ECG Confirmed  by Alvino Chapel  MD, Ovid Curd [id number]) on 11/30/2015 7:30:10 PM      MDM   Final diagnoses:  HCAP (healthcare-associated pneumonia)  Neutropenia, unspecified type ([place])  Candidiasis of perineum    Patient presents with cough with sputum production. No fever but neutropenia. Feels somewhat better after initial dose of IV fluids. X-ray showed possible Clinically she does have it with cough and sputum production. Will admit to internal medicine.   Davonna Belling, MD 11/30/15 2147

## 2015-11-30 NOTE — ED Notes (Signed)
Patient would like her port accessed. 

## 2015-11-30 NOTE — Progress Notes (Addendum)
Pharmacy Antibiotic Note 47 YOF presents with shortness of breath and chest pain.  Pharmacy asked to dose vancomycin for pneumonia (cefepime x 1 ordered so far in ED).  CXR interpretation states atelectasis >> pneumonia.  Received chemo (docetaxel and carboplatin) and neulasta on 7/5 for breast cancer.   Today, 11/30/2015:  Lactic acid = 2.13  WBC = 0.6 (no diff)  SCr = 1.58  afebrile  Plan:  Vancomycin 1gm IV q24h  Monitor renal function closely  Trough if remains on vancomycin > 4-5 days  cefepime 1gm IV q12h  Height: 5\' 4"  (162.6 cm) Weight: 170 lb (77.111 kg) IBW/kg (Calculated) : 54.7  Temp (24hrs), Avg:97.8 F (36.6 C), Min:97.8 F (36.6 C), Max:97.8 F (36.6 C)   Recent Labs Lab 11/24/15 1150 11/24/15 1150 11/30/15 1839 11/30/15 1938  WBC 22.0*  --  0.6*  --   CREATININE  --  1.5*  --   --   LATICACIDVEN  --   --   --  2.13*    Estimated Creatinine Clearance: 36.1 mL/min (by C-G formula based on Cr of 1.5).    No Known Allergies  Antimicrobials this admission: 7/11 >> vancomycin 7/11 >> cefepime  Dose adjustments this admission:  Microbiology results: 7/11 BCx:   Thank you for allowing pharmacy to be a part of this patient's care.  Doreene Eland, PharmD, BCPS.   Pager: [phone] 11/30/2015 7:50 PM   Pharmacy asked to dose acyclovir PO for oral herpes  Plan: - acyclovir 400mg  PO TID (suggest treat 5 days)  Doreene Eland, PharmD, BCPS.   Pager: [phone] 11/30/2015 10:10 PM

## 2015-12-01 ENCOUNTER — Ambulatory Visit: Payer: Medicare Other

## 2015-12-01 DIAGNOSIS — D701 Agranulocytosis secondary to cancer chemotherapy: Secondary | ICD-10-CM

## 2015-12-01 DIAGNOSIS — R634 Abnormal weight loss: Secondary | ICD-10-CM

## 2015-12-01 DIAGNOSIS — N183 Chronic kidney disease, stage 3 unspecified: Secondary | ICD-10-CM

## 2015-12-01 DIAGNOSIS — T451X5A Adverse effect of antineoplastic and immunosuppressive drugs, initial encounter: Secondary | ICD-10-CM

## 2015-12-01 DIAGNOSIS — N1832 Chronic kidney disease, stage 3b: Secondary | ICD-10-CM

## 2015-12-01 DIAGNOSIS — E860 Dehydration: Secondary | ICD-10-CM

## 2015-12-01 DIAGNOSIS — R05 Cough: Secondary | ICD-10-CM

## 2015-12-01 DIAGNOSIS — B009 Herpesviral infection, unspecified: Secondary | ICD-10-CM

## 2015-12-01 DIAGNOSIS — D61810 Antineoplastic chemotherapy induced pancytopenia: Secondary | ICD-10-CM

## 2015-12-01 DIAGNOSIS — E43 Unspecified severe protein-calorie malnutrition: Secondary | ICD-10-CM

## 2015-12-01 DIAGNOSIS — R197 Diarrhea, unspecified: Secondary | ICD-10-CM

## 2015-12-01 DIAGNOSIS — B373 Candidiasis of vulva and vagina: Secondary | ICD-10-CM

## 2015-12-01 DIAGNOSIS — B3731 Acute candidiasis of vulva and vagina: Secondary | ICD-10-CM

## 2015-12-01 DIAGNOSIS — D6959 Other secondary thrombocytopenia: Secondary | ICD-10-CM

## 2015-12-01 DIAGNOSIS — R059 Cough, unspecified: Secondary | ICD-10-CM

## 2015-12-01 DIAGNOSIS — R630 Anorexia: Secondary | ICD-10-CM

## 2015-12-01 HISTORY — DX: Herpesviral infection, unspecified: B00.9

## 2015-12-01 LAB — URINALYSIS, ROUTINE W REFLEX MICROSCOPIC
BILIRUBIN URINE: NEGATIVE
Glucose, UA: 250 mg/dL — AB
KETONES UR: NEGATIVE mg/dL
Leukocytes, UA: NEGATIVE
NITRITE: NEGATIVE
PROTEIN: 30 mg/dL — AB
SPECIFIC GRAVITY, URINE: 1.017 (ref 1.005–1.030)
pH: 5.5 (ref 5.0–8.0)

## 2015-12-01 LAB — GLUCOSE, CAPILLARY
GLUCOSE-CAPILLARY: 165 mg/dL — AB (ref 65–99)
Glucose-Capillary: 138 mg/dL — ABNORMAL HIGH (ref 65–99)
Glucose-Capillary: 109 mg/dL — ABNORMAL HIGH (ref 65–99)
Glucose-Capillary: 133 mg/dL — ABNORMAL HIGH (ref 65–99)

## 2015-12-01 LAB — LACTIC ACID, PLASMA
LACTIC ACID, VENOUS: 0.9 mmol/L (ref 0.5–1.9)
Lactic Acid, Venous: 1.5 mmol/L (ref 0.5–1.9)

## 2015-12-01 LAB — BASIC METABOLIC PANEL
Anion gap: 7 (ref 5–15)
BUN: 19 mg/dL (ref 6–20)
CALCIUM: 7.7 mg/dL — AB (ref 8.9–10.3)
CHLORIDE: 110 mmol/L (ref 101–111)
CO2: 19 mmol/L — AB (ref 22–32)
CREATININE: 1.19 mg/dL — AB (ref 0.44–1.00)
GFR calc non Af Amer: 46 mL/min — ABNORMAL LOW (ref >60)
GFR, EST AFRICAN AMERICAN: 53 mL/min — AB (ref >60)
GLUCOSE: 156 mg/dL — AB (ref 65–99)
Potassium: 3.7 mmol/L (ref 3.5–5.1)
Sodium: 136 mmol/L (ref 135–145)

## 2015-12-01 LAB — CBC
HCT: 20.8 % — ABNORMAL LOW (ref 36.0–46.0)
HEMATOCRIT: 22.3 % — AB (ref 36.0–46.0)
HEMOGLOBIN: 7.6 g/dL — AB (ref 12.0–15.0)
Hemoglobin: 7.1 g/dL — ABNORMAL LOW (ref 12.0–15.0)
MCH: 30.5 pg (ref 26.0–34.0)
MCH: 30.9 pg (ref 26.0–34.0)
MCHC: 34.1 g/dL (ref 30.0–36.0)
MCHC: 34.1 g/dL (ref 30.0–36.0)
MCV: 89.6 fL (ref 78.0–100.0)
MCV: 90.4 fL (ref 78.0–100.0)
PLATELETS: 30 10*3/uL — AB (ref 150–400)
Platelets: 50 10*3/uL — ABNORMAL LOW (ref 150–400)
RBC: 2.49 MIL/uL — ABNORMAL LOW (ref 3.87–5.11)
RBC: 2.3 MIL/uL — AB (ref 3.87–5.11)
RDW: 15.4 % (ref 11.5–15.5)
RDW: 15.3 % (ref 11.5–15.5)
WBC: 0.7 10*3/uL — CL (ref 4.0–10.5)
WBC: 1.4 10*3/uL — AB (ref 4.0–10.5)

## 2015-12-01 LAB — IRON AND TIBC
IRON: 26 ug/dL — AB (ref 28–170)
Saturation Ratios: 18 % (ref 10.4–31.8)
TIBC: 143 ug/dL — ABNORMAL LOW (ref 250–450)
UIBC: 117 ug/dL

## 2015-12-01 LAB — VITAMIN B12: VITAMIN B 12: 2200 pg/mL — AB (ref 180–914)

## 2015-12-01 LAB — RETICULOCYTES
RBC.: 2.3 MIL/uL — ABNORMAL LOW (ref 3.87–5.11)
Retic Ct Pct: <0.4 % — ABNORMAL LOW (ref 0.4–3.1)

## 2015-12-01 LAB — URINE MICROSCOPIC-ADD ON

## 2015-12-01 LAB — HIV ANTIBODY (ROUTINE TESTING W REFLEX): HIV SCREEN 4TH GENERATION: NONREACTIVE

## 2015-12-01 LAB — FERRITIN: FERRITIN: 2980 ng/mL — AB (ref 11–307)

## 2015-12-01 LAB — FOLATE: Folate: 8.6 ng/mL (ref >5.9)

## 2015-12-01 LAB — STREP PNEUMONIAE URINARY ANTIGEN: Strep Pneumo Urinary Antigen: NEGATIVE

## 2015-12-01 MED ORDER — SODIUM CHLORIDE 0.9 % IV BOLUS (SEPSIS)
500.0000 mL | Freq: Once | INTRAVENOUS | Status: AC
  Administered 2015-12-01: 500 mL via INTRAVENOUS

## 2015-12-01 MED ORDER — BENZONATATE 100 MG PO CAPS
100.0000 mg | ORAL_CAPSULE | Freq: Three times a day (TID) | ORAL | Status: DC
  Administered 2015-12-01 – 2015-12-03 (×6): 100 mg via ORAL
  Filled 2015-12-01 (×6): qty 1

## 2015-12-01 MED ORDER — ENSURE ENLIVE PO LIQD
237.0000 mL | Freq: Three times a day (TID) | ORAL | Status: DC
  Administered 2015-12-01 – 2015-12-03 (×7): 237 mL via ORAL

## 2015-12-01 MED ORDER — SODIUM CHLORIDE 0.9% FLUSH
10.0000 mL | INTRAVENOUS | Status: DC | PRN
  Administered 2015-12-01: 10 mL
  Administered 2015-12-02: 20 mL
  Filled 2015-12-01 (×2): qty 40

## 2015-12-01 MED ORDER — HYDROCOD POLST-CPM POLST ER 10-8 MG/5ML PO SUER
5.0000 mL | Freq: Two times a day (BID) | ORAL | Status: DC
  Administered 2015-12-01 – 2015-12-03 (×4): 5 mL via ORAL
  Filled 2015-12-01 (×4): qty 5

## 2015-12-01 MED ORDER — GUAIFENESIN-DM 100-10 MG/5ML PO SYRP
5.0000 mL | ORAL_SOLUTION | ORAL | Status: DC | PRN
  Administered 2015-12-01: 5 mL via ORAL
  Filled 2015-12-01: qty 10

## 2015-12-01 MED ORDER — LEVOFLOXACIN IN D5W 750 MG/150ML IV SOLN
750.0000 mg | INTRAVENOUS | Status: DC
  Administered 2015-12-01: 750 mg via INTRAVENOUS
  Filled 2015-12-01: qty 150

## 2015-12-01 MED ORDER — TBO-FILGRASTIM 480 MCG/0.8ML ~~LOC~~ SOSY
480.0000 ug | PREFILLED_SYRINGE | Freq: Once | SUBCUTANEOUS | Status: AC
  Administered 2015-12-01: 480 ug via SUBCUTANEOUS
  Filled 2015-12-01 (×2): qty 0.8

## 2015-12-01 MED ORDER — GUAIFENESIN-DM 100-10 MG/5ML PO SYRP: 5.0000 mL | ORAL_SOLUTION | ORAL | Status: DC | PRN

## 2015-12-01 MED ORDER — SACCHAROMYCES BOULARDII 250 MG PO CAPS
250.0000 mg | ORAL_CAPSULE | Freq: Two times a day (BID) | ORAL | Status: DC
  Administered 2015-12-01 – 2015-12-03 (×5): 250 mg via ORAL
  Filled 2015-12-01 (×5): qty 1

## 2015-12-01 MED ORDER — MENTHOL 3 MG MT LOZG
1.0000 | LOZENGE | OROMUCOSAL | Status: DC | PRN
  Filled 2015-12-01: qty 9

## 2015-12-01 MED ORDER — DILTIAZEM HCL ER COATED BEADS 120 MG PO CP24
120.0000 mg | ORAL_CAPSULE | Freq: Every day | ORAL | Status: DC
  Administered 2015-12-01 – 2015-12-03 (×3): 120 mg via ORAL
  Filled 2015-12-01 (×3): qty 1

## 2015-12-01 NOTE — Progress Notes (Signed)
Pharmacy Antibiotic Note 54 YOF presents with shortness of breath and chest pain.  Pharmacy asked to dose vancomycin for pneumonia (cefepime x 1 ordered so far in ED).  CXR interpretation states atelectasis >> pneumonia.  Received chemo (docetaxel and carboplatin) and neulasta on 7/5 for breast cancer.   Today, 12/01/2015:  Lactic acid 0.9 < 1.5 < 2.13 (on admit)  WBC = 0.7 (ANC 100 )  SCr = 1.19 improved  Tmax 99  Plan:  Vancomycin 1gm IV q24h  Monitor renal function closely  Trough if remains on vancomycin > 4-5 days  Cefepime 1gm IV q12h  Add Levaquin 750mg  q48h for atypical coverage  Acyclovir added for oral herpes  Height: 5\' 4"  (162.6 cm) Weight: 174 lb 1.6 oz (78.971 kg) IBW/kg (Calculated) : 54.7  Temp (24hrs), Avg:98.7 F (37.1 C), Min:97.8 F (36.6 C), Max:99.1 F (37.3 C)   Recent Labs Lab 11/24/15 1150 11/24/15 1150 11/30/15 1839 11/30/15 1938 12/01/15 0028 12/01/15 0410 12/01/15 0700  WBC 22.0*  --  0.6*  --   --  0.7*  --   CREATININE  --  1.5* 1.58*  --   --  1.19*  --   LATICACIDVEN  --   --   --  2.13* 1.5  --  0.9    Estimated Creatinine Clearance: 46 mL/min (by C-G formula based on Cr of 1.19).    No Known Allergies  Antimicrobials this admission: 7/11 >> vancomycin 7/11 >> cefepime 7/11  acyclovir PO 7/11 Fluconazole po q72h >> 7/12 Levaquin >>  Dose adjustments this admission:  Microbiology results: 7/11 BCx: ngtd 7/11 Strep pneumo Ag: neg  Thank you for allowing pharmacy to be a part of this patient's care.  Minda Ditto PharmD Pager [phone] 12/01/2015, 10:23 AM

## 2015-12-01 NOTE — Progress Notes (Addendum)
PROGRESS NOTE    [name]  [id number] DOB: June 27, 1945 DOA: 11/30/2015  PCP: [name], MD   Brief Narrative:  70 y/o with right breast CA on chemotherapy who just completed her 2nd cycle on 7/5. She presents to the hospital for a cough and chest pain. Cough is productive of clear sputum. She has had sweats and chills. She has had at least 3 loose stools a day since her chemo and her appetite has been poor. She has lost weight but cannot quantify. She has sores on her lips and in her gluteal cleft. She has been applying hydrocortisone ointment in her gluteal cleft. She has dysuria as well. Noted to be pancytopenic in the ER.  Admitted for pneumonia and started on Vanc, Cefepime and Acyclovir. She told the admitted doc that she had burning when she urinated and was put on Diflucan for possible yeast infection.   Subjective: Cough persists. Chest pain has resolved. Continues to have loose stools but no vomiting or abdominal pain. Still has dysuria.   Assessment & Plan:   Principal Problem:   Cough - ? Pneumonia- ? 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 - may also have HSV 2- has some papules in gluteal cleft which are tender- no vesicles - Acyclovir  Dysuria - UA negative- likely due to concentrated urine  Dehydration - IVF  Anorexia-  Loss of weight-  Protein-calorie malnutrition, severe - follow - add supplements  Chronic kidney disease, stage 3 - stable  Anemia - check anemia panel  HTN - Cardizem  DVT prophylaxis: SCDs Code Status: Full  Family Communication:  daughter Disposition Plan: cont to follow in hospital Consultants:   oncology Procedures:    Antimicrobials:  Anti-infectives    Start     Dose/Rate Route Frequency Ordered Stop   12/01/15 1600  vancomycin (VANCOCIN) IVPB 1000 mg/200 mL premix     1,000 mg 200 mL/hr over 60 Minutes Intravenous Every 24 hours 11/30/15 1951     12/01/15 1000  ceFEPIme (MAXIPIME) 1 g in dextrose 5 % 50 mL IVPB     1 g 100 mL/hr over 30 Minutes Intravenous Every 12 hours 11/30/15 2123     12/01/15 0900  levofloxacin (LEVAQUIN) IVPB 750 mg     750 mg 100 mL/hr over 90 Minutes Intravenous Every 48 hours 12/01/15 0834     11/30/15 2300  acyclovir (ZOVIRAX) tablet 400 mg     400 mg Oral 3 times daily 11/30/15 2214     11/30/15 2200  fluconazole (DIFLUCAN) tablet 150 mg  Status:  Discontinued     150 mg Oral every 72 hours 11/30/15 2115 12/01/15 1153   11/30/15 2000  ceFEPIme (MAXIPIME) 1 g in dextrose 5 % 50 mL IVPB     1 g 100 mL/hr over 30 Minutes Intravenous  Once 11/30/15 1933 11/30/15 2031   11/30/15 2000  vancomycin (VANCOCIN) IVPB 1000 mg/200 mL premix     1,000 mg 200 mL/hr over 60 Minutes Intravenous  Once 11/30/15 1945 11/30/15 2132       Objective: Filed Vitals:   11/30/15 2235 12/01/15  0502 12/01/15 1054 12/01/15 1356  BP: 157/74 148/75  130/63  Pulse: 119 122 103 101  Temp: 98.8 F (37.1 C) 99 F (37.2 C)  97.9 F (36.6 C)  TempSrc: Oral Oral  Oral  Resp:  24 18 20   Height: 5\' 4"  (1.626 m)     Weight: 78.971 kg (174 lb 1.6 oz)     SpO2: 100% 96%  99%    Intake/Output Summary (Last 24 hours) at 12/01/15 1653 Last data filed at 12/01/15 1435  Gross per 24 hour  Intake 1304.58 ml  Output    925 ml  Net 379.58 ml   Filed Weights   11/30/15 1605 11/30/15 2235  Weight: 77.111 kg (170 lb) 78.971 kg (174 lb 1.6 oz)    Examination: General exam: Appears comfortable  HEENT: PERRLA, oral mucosa moist, no sclera icterus or thrush-   Respiratory system: Clear to auscultation.  Respiratory effort normal. Cardiovascular system: S1 & S2 heard, RRR.  No murmurs  Gastrointestinal system: Abdomen soft, non-tender, nondistended. Normal bowel sound. No organomegaly Central nervous system: Alert and oriented. No focal neurological deficits. Extremities: No cyanosis, clubbing or edema Skin: papules on lips and gluteal cleft Psychiatry:  Mood & affect appropriate.     Data Reviewed: I have personally reviewed following labs and imaging studies  CBC:  Recent Labs Lab 11/30/15 1839 12/01/15 0410  WBC 0.6* 0.7*  NEUTROABS 0.1*  --   HGB 9.3* 7.6*  HCT 27.9* 22.3*  MCV 90.0 89.6  PLT 51* 50*   Basic Metabolic Panel:  Recent Labs Lab 11/30/15 1839 12/01/15 0410  NA 136 136  K 4.2 3.7  CL 106 110  CO2 20* 19*  GLUCOSE 168* 156*  BUN 29* 19  CREATININE 1.58* 1.19*  CALCIUM 9.0 7.7*   GFR: Estimated Creatinine Clearance: 46 mL/min (by C-G formula based on Cr of 1.19). Liver Function Tests:  Recent Labs Lab 11/30/15 1839  AST 31  ALT 44  ALKPHOS 93  BILITOT 1.0  PROT 7.4  ALBUMIN 3.1*   No results for input(s): LIPASE, AMYLASE in the last 168 hours. No results for input(s): AMMONIA in the last 168 hours. Coagulation Profile: No results for input(s): INR, PROTIME in the last 168 hours. Cardiac Enzymes: No results for input(s): CKTOTAL, CKMB, CKMBINDEX, TROPONINI in the last 168 hours. BNP (last 3 results) No results for input(s): PROBNP in the last 8760 hours. HbA1C: No results for input(s): HGBA1C in the last 72 hours. CBG:  Recent Labs Lab 11/30/15 2326 12/01/15 0752 12/01/15 1144  GLUCAP 163* 138* 165*   Lipid Profile: No results for input(s): CHOL, HDL, LDLCALC, TRIG, CHOLHDL, LDLDIRECT in the last 72 hours. Thyroid Function Tests: No results for input(s): TSH, T4TOTAL, FREET4, T3FREE, THYROIDAB in the last 72 hours. Anemia Panel: No results for input(s): VITAMINB12, FOLATE, FERRITIN, TIBC, IRON, RETICCTPCT in the last 72  hours. Urine analysis:    Component Value Date/Time   COLORURINE YELLOW 12/01/2015 1359   APPEARANCEUR CLOUDY* 12/01/2015 1359   LABSPEC 1.017 12/01/2015 1359   PHURINE 5.5 12/01/2015 1359   GLUCOSEU 250* 12/01/2015 1359   HGBUR SMALL* 12/01/2015 1359   BILIRUBINUR NEGATIVE 12/01/2015 1359   BILIRUBINUR NEG 05/31/2012 1047   KETONESUR NEGATIVE 12/01/2015 1359   PROTEINUR 30* 12/01/2015 1359   PROTEINUR NEG 05/31/2012 1047   UROBILINOGEN 0.2 05/31/2012 1047   UROBILINOGEN 1.0 10/20/2009 1857   NITRITE NEGATIVE 12/01/2015 1359   NITRITE POSITIVE 05/31/2012 1047   LEUKOCYTESUR NEGATIVE 12/01/2015 1359  Sepsis Labs: @LABRCNTIP (procalcitonin:4,lacticidven:4) ) Recent Results (from the past 240 hour(s))  Culture, blood (routine x 2)     Status: None (Preliminary result)   Collection Time: 11/30/15  7:50 PM  Result Value Ref Range Status   Specimen Description BLOOD LEFT CHEST PORT  Final   Special Requests BOTTLES DRAWN AEROBIC AND ANAEROBIC 10CC  Final   Culture   Final    NO GROWTH < 24 HOURS Performed at [facility]    Report Status PENDING  Incomplete  Culture, blood (routine x 2)     Status: None (Preliminary result)   Collection Time: 11/30/15  7:58 PM  Result Value Ref Range Status   Specimen Description LEFT ANTECUBITAL  Final   Special Requests BOTTLES DRAWN AEROBIC AND ANAEROBIC 5CC  Final   Culture   Final    NO GROWTH < 24 HOURS Performed at [facility]    Report Status PENDING  Incomplete         Radiology Studies: Dg Chest 2 View  11/30/2015  CLINICAL DATA:  Worsening chest pain and shortness of breath. Breast cancer. Receiving chemotherapy. EXAM: CHEST  2 VIEW COMPARISON:  10/29/2015, 10/27/2012 FINDINGS: Postop changes over the right breast with surgical clips noted. Left subclavian power port catheter tip proximal SVC as before. Normal heart size and vascularity. Minimal left basilar streaky opacity, atelectasis is favored over  pneumonia. Right lung remains clear. Artifact over the right apex. No edema, significant effusion or pneumothorax. Trachea is midline. Degenerative changes of the spine and left shoulder. IMPRESSION: Streaky left lower lobe opacity compatible with atelectasis, less likely developing pneumonia. Stable postoperative findings as above Electronically Signed   By: Jerilynn Mages.  Shick M.D.   On: 11/30/2015 16:57      Scheduled Meds: . acyclovir  400 mg Oral TID  . ceFEPime (MAXIPIME) IV  1 g Intravenous Q12H  . diltiazem  120 mg Oral Daily  . feeding supplement (ENSURE ENLIVE)  237 mL Oral TID BM  . levofloxacin (LEVAQUIN) IV  750 mg Intravenous Q48H  . pantoprazole  80 mg Oral Daily  . saccharomyces boulardii  250 mg Oral BID  . Tbo-filgastrim (GRANIX) SQ  480 mcg Subcutaneous Once  . vancomycin  1,000 mg Intravenous Q24H   Continuous Infusions: . sodium chloride 125 mL/hr at 12/01/15 1228     LOS: 1 day    Time spent in minutes: 15    [place], MD [facility] Pager: [email or web address] Password [email or web address] 12/01/2015, 4:53 PM

## 2015-12-01 NOTE — Progress Notes (Signed)
Initial Nutrition Assessment  DOCUMENTATION CODES:   Severe malnutrition in context of acute illness/injury  INTERVENTION:   -Provide Ensure Enlive po TID, each supplement provides 350 kcal and 20 grams of protein -Encourage PO intake -Provided strategies to help with taste changes -RD to continue to monitor  NUTRITION DIAGNOSIS:   Increased nutrient needs related to cancer and cancer related treatments as evidenced by estimated needs.  GOAL:   Patient will meet greater than or equal to 90% of their needs  MONITOR:   PO intake, Supplement acceptance, Labs, Weight trends, I & O's  REASON FOR ASSESSMENT:   Malnutrition Screening Tool    ASSESSMENT:   70 y.o. female with medical history significant of BRCA stage 1, triple negative so undergoing chemotherapy, got radiation therapy. Just completed cycle 2 on the 5th of July. Patient presents to ED with cough, congestion, SOB. Symptoms onset since getting her chemo, symptoms worsening, nothing makes them better or worse, no associated fever. Also has yeast infection and oral herpes outbreak. Decreased PO intake due to PO pain.  Patient in room with daughter at bedside. Pt is receiving chemo treatments for Stage 1 Breast cancer, last cycle ended on 7/5 per chart review.  Pt reports poor appetite and taste changes over the past week. She states she has eaten very little, with icees and popsicles only. Pt denies any swallowing issues. She states foods have a metallic taste to her. Reviewed some strategies to aid in taste changes. She is currently trying to a eat a pancake. She tries to eat despite having no appetite. Prior this past week, pt reports drinking up to 3 Ensures a day. RD will order TID.  Per weight history, pt has lost 20 lb since 3/19 (10% wt loss x 4 months, significant for time frame). Nutrition focused physical exam shows no sign of depletion of muscle mass or body fat.  Medications reviewed. Labs reviewed: CBGs:  138-165  Diet Order:  Diet regular Room service appropriate?: Yes; Fluid consistency:: Thin  Skin:  Reviewed, no issues  Last BM:  7/11  Height:   Ht Readings from Last 1 Encounters:  11/30/15 5' 4" (1.626 m)    Weight:   Wt Readings from Last 1 Encounters:  11/30/15 174 lb 1.6 oz (78.971 kg)    Ideal Body Weight:  54.5 kg  BMI:  Body mass index is 29.87 kg/(m^2).  Estimated Nutritional Needs:   Kcal:  1900-2100  Protein:  85-95g  Fluid:  2.1L/day  EDUCATION NEEDS:   Education needs addressed   , MS, RD, LDN Pager: [phone] After Hours Pager: [phone]  

## 2015-12-02 ENCOUNTER — Ambulatory Visit: Payer: Medicare Other

## 2015-12-02 ENCOUNTER — Inpatient Hospital Stay (HOSPITAL_COMMUNITY): Payer: Medicare Other

## 2015-12-02 DIAGNOSIS — D649 Anemia, unspecified: Secondary | ICD-10-CM

## 2015-12-02 DIAGNOSIS — R5081 Fever presenting with conditions classified elsewhere: Secondary | ICD-10-CM

## 2015-12-02 DIAGNOSIS — D709 Neutropenia, unspecified: Secondary | ICD-10-CM

## 2015-12-02 DIAGNOSIS — A058 Other specified bacterial foodborne intoxications: Secondary | ICD-10-CM

## 2015-12-02 DIAGNOSIS — A044 Other intestinal Escherichia coli infections: Secondary | ICD-10-CM

## 2015-12-02 DIAGNOSIS — C50911 Malignant neoplasm of unspecified site of right female breast: Secondary | ICD-10-CM

## 2015-12-02 LAB — GASTROINTESTINAL PANEL BY PCR, STOOL (REPLACES STOOL CULTURE)
Adenovirus F40/41: NOT DETECTED
Astrovirus: NOT DETECTED
Campylobacter species: NOT DETECTED
Cryptosporidium: NOT DETECTED
Cyclospora cayetanensis: NOT DETECTED
E. coli O157: NOT DETECTED
Entamoeba histolytica: NOT DETECTED
Enteroaggregative E coli (EAEC): NOT DETECTED
Enteropathogenic E coli (EPEC): DETECTED — AB
Enterotoxigenic E coli (ETEC): NOT DETECTED
Giardia lamblia: NOT DETECTED
Norovirus GI/GII: NOT DETECTED
Plesimonas shigelloides: NOT DETECTED
Rotavirus A: NOT DETECTED
Salmonella species: NOT DETECTED
Sapovirus (I, II, IV, and V): NOT DETECTED
Shiga like toxin producing E coli (STEC): NOT DETECTED
Shigella/Enteroinvasive E coli (EIEC): NOT DETECTED
Vibrio cholerae: NOT DETECTED
Vibrio species: NOT DETECTED
Yersinia enterocolitica: NOT DETECTED

## 2015-12-02 LAB — BASIC METABOLIC PANEL
Anion gap: 6 (ref 5–15)
BUN: 11 mg/dL (ref 6–20)
CHLORIDE: 113 mmol/L — AB (ref 101–111)
CO2: 20 mmol/L — AB (ref 22–32)
CREATININE: 1.05 mg/dL — AB (ref 0.44–1.00)
Calcium: 7.9 mg/dL — ABNORMAL LOW (ref 8.9–10.3)
GFR calc Af Amer: >60 mL/min (ref >60)
GFR calc non Af Amer: 53 mL/min — ABNORMAL LOW (ref >60)
Glucose, Bld: 113 mg/dL — ABNORMAL HIGH (ref 65–99)
POTASSIUM: 3.8 mmol/L (ref 3.5–5.1)
Sodium: 139 mmol/L (ref 135–145)

## 2015-12-02 LAB — CBC
HEMATOCRIT: 20.3 % — AB (ref 36.0–46.0)
HEMATOCRIT: 22.8 % — AB (ref 36.0–46.0)
HEMOGLOBIN: 7 g/dL — AB (ref 12.0–15.0)
Hemoglobin: 7.8 g/dL — ABNORMAL LOW (ref 12.0–15.0)
MCH: 30.8 pg (ref 26.0–34.0)
MCH: 31 pg (ref 26.0–34.0)
MCHC: 34.5 g/dL (ref 30.0–36.0)
MCHC: 34.2 g/dL (ref 30.0–36.0)
MCV: 89.4 fL (ref 78.0–100.0)
MCV: 90.5 fL (ref 78.0–100.0)
PLATELETS: 39 10*3/uL — AB (ref 150–400)
Platelets: 31 10*3/uL — ABNORMAL LOW (ref 150–400)
RBC: 2.27 MIL/uL — AB (ref 3.87–5.11)
RBC: 2.52 MIL/uL — AB (ref 3.87–5.11)
RDW: 15.6 % — ABNORMAL HIGH (ref 11.5–15.5)
RDW: 15.7 % — ABNORMAL HIGH (ref 11.5–15.5)
WBC: 2 10*3/uL — ABNORMAL LOW (ref 4.0–10.5)
WBC: 3.5 10*3/uL — AB (ref 4.0–10.5)

## 2015-12-02 LAB — GLUCOSE, CAPILLARY
GLUCOSE-CAPILLARY: 216 mg/dL — AB (ref 65–99)
GLUCOSE-CAPILLARY: 180 mg/dL — AB (ref 65–99)
Glucose-Capillary: 108 mg/dL — ABNORMAL HIGH (ref 65–99)
Glucose-Capillary: 249 mg/dL — ABNORMAL HIGH (ref 65–99)

## 2015-12-02 LAB — URINE CULTURE
Culture: 1000 — AB
Special Requests: NORMAL

## 2015-12-02 MED ORDER — PANTOPRAZOLE SODIUM 40 MG PO TBEC
40.0000 mg | DELAYED_RELEASE_TABLET | Freq: Every day | ORAL | Status: DC
  Administered 2015-12-03: 40 mg via ORAL
  Filled 2015-12-02: qty 1

## 2015-12-02 MED ORDER — SALINE SPRAY 0.65 % NA SOLN
1.0000 | NASAL | Status: DC | PRN
  Filled 2015-12-02: qty 44

## 2015-12-02 MED ORDER — DEXTROSE 5 % IV SOLN
1.0000 g | Freq: Three times a day (TID) | INTRAVENOUS | Status: DC
  Administered 2015-12-02: 1 g via INTRAVENOUS
  Filled 2015-12-02 (×2): qty 1

## 2015-12-02 MED ORDER — LEVOFLOXACIN IN D5W 750 MG/150ML IV SOLN
750.0000 mg | Freq: Every day | INTRAVENOUS | Status: DC
  Administered 2015-12-02: 750 mg via INTRAVENOUS
  Filled 2015-12-02: qty 150

## 2015-12-02 MED ORDER — LEVOFLOXACIN IN D5W 750 MG/150ML IV SOLN: 750.0000 mg | INTRAVENOUS | Status: DC

## 2015-12-02 MED ORDER — METHYLPREDNISOLONE SODIUM SUCC 125 MG IJ SOLR
80.0000 mg | Freq: Once | INTRAMUSCULAR | Status: AC
  Administered 2015-12-02: 80 mg via INTRAVENOUS
  Filled 2015-12-02: qty 2

## 2015-12-02 MED ORDER — OXYMETAZOLINE HCL 0.05 % NA SOLN
2.0000 | Freq: Two times a day (BID) | NASAL | Status: DC | PRN
  Filled 2015-12-02: qty 15

## 2015-12-02 MED ORDER — SODIUM CHLORIDE 0.9 % IV SOLN
510.0000 mg | Freq: Once | INTRAVENOUS | Status: AC
  Administered 2015-12-02: 510 mg via INTRAVENOUS
  Filled 2015-12-02: qty 17

## 2015-12-02 NOTE — Progress Notes (Signed)
Patient had an episode of persistent nose bleeding early this afternoon. Bleeding was in very small amounts. MD Rizwan made aware. VSS- RN pinched together patient's nose for 10 mins per MD to assist with forming a clot- bleeding was resolved after this. Patient tolerated well and stated she felt better. Hgb increased to 7.8 and Plt increased to 39 this evening.

## 2015-12-02 NOTE — Progress Notes (Signed)
Pharmacy Antibiotic Note 33 YOF presents with shortness of breath and chest pain.  Pharmacy asked to dose vancomycin for pneumonia (cefepime x 1 ordered so far in ED).  CXR interpretation states atelectasis >> pneumonia.  Received chemo (docetaxel and carboplatin) and neulasta on 7/5 for breast cancer.   Day #3 antibiotics Today, 12/02/2015: - patient experiencing diarrhea, GI panel ordered - vanco and levofloxacin stopped by Oncology this am - renal: Scr improved with Crcl > 50 - CBC: WBC improving (granix x 1 7/12), + anemia (feraheme x 1 for total iron = 26) - LA improved to WNL - afebrile  Plan:  Increase Cefepime 1gm IV q8h for improved SCr  Acyclovir 400mg  PO TID for oral herpes  Height: 5\' 4"  (162.6 cm) Weight: 174 lb 1.6 oz (78.971 kg) IBW/kg (Calculated) : 54.7  Temp (24hrs), Avg:98.2 F (36.8 C), Min:97.9 F (36.6 C), Max:98.4 F (36.9 C)   Recent Labs Lab 11/30/15 1839 11/30/15 1938 12/01/15 0028 12/01/15 0410 12/01/15 0700 12/01/15 1805 12/02/15 0400  WBC 0.6*  --   --  0.7*  --  1.4* 2.0*  CREATININE 1.58*  --   --  1.19*  --   --  1.05*  LATICACIDVEN  --  2.13* 1.5  --  0.9  --   --     Estimated Creatinine Clearance: 52.1 mL/min (by C-G formula based on Cr of 1.05).    No Known Allergies  Antimicrobials this admission: 7/11 >> vancomycin >> 7/12 7/11 >> cefepime 7/11  acyclovir PO 7/11 Fluconazole po q72h >> 7/12 Levaquin >> 7/12  Dose adjustments this admission: 7/13: Change cefepime to 1gm IV q8h for improved renal fx  Microbiology results: 7/11 BCx: NGTD 7/11 Strep pneumo: neg 7/12 respiratory panel: pending 7/12 UCx: pending 7/13 GI panel:    Thank you for allowing pharmacy to be a part of this patient's care.  Doreene Eland, PharmD, BCPS.   Pager: [phone] 12/02/2015 7:52 AM

## 2015-12-02 NOTE — Progress Notes (Signed)
PROGRESS NOTE    [name]  [id number] DOB: 22-Nov-1945 DOA: 11/30/2015  PCP: [name], MD   Brief Narrative:  70 y/o with right breast CA on chemotherapy who just completed her 2nd cycle on 7/5. She presents to the hospital for a cough and chest pain. Cough is productive of clear sputum. She has had sweats and chills. She has had at least 3 loose stools a day since her chemo and her appetite has been poor. She has lost weight but cannot quantify. She has sores on her lips and in her gluteal cleft. She has been applying hydrocortisone ointment in her gluteal cleft. She has dysuria as well. Noted to be pancytopenic in the ER.  Admitted for pneumonia and started on Vanc, Cefepime and Acyclovir. She told the admitted doc that she had burning when she urinated and was put on Diflucan for possible yeast infection.   Subjective: Cough improving. Chest pain has resolved. Diarrhea resolving. No abdominal pain.   Assessment & Plan:   Principal Problem:   Cough -   ? 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 - also has HSV 2- has some papules and now ulcers in gluteal cleft which are tender- no vesicles - Acyclovir  Dysuria - UA negative- likely due to concentrated urine  Dehydration - IVF- will stop IVF today and follow oral intake  Anorexia-  Loss of weight-  Protein-calorie malnutrition, severe - follow - add supplements  Chronic kidney disease, stage 3 - stable  Anemia -  anemia panel consistent with AOCD  HTN - Cardizem  DVT prophylaxis:  SCDs Code Status: Full  Family Communication: daughter Disposition Plan: cont to follow in hospital Consultants:   oncology Procedures:    Antimicrobials:  Anti-infectives    Start     Dose/Rate Route Frequency Ordered Stop   12/02/15 1230  levofloxacin (LEVAQUIN) IVPB 750 mg     750 mg 100 mL/hr over 90 Minutes Intravenous Daily 12/02/15 1143     12/02/15 1200  levofloxacin (LEVAQUIN) IVPB 750 mg  Status:  Discontinued     750 mg 100 mL/hr over 90 Minutes Intravenous Every 24 hours 12/02/15 1124 12/02/15 1143   12/02/15 0830  ceFEPIme (MAXIPIME) 1 g in dextrose 5 % 50 mL IVPB  Status:  Discontinued     1 g 100 mL/hr over 30 Minutes Intravenous Every 8 hours 12/02/15 0750 12/02/15 1124   12/01/15 1600  vancomycin (VANCOCIN) IVPB 1000 mg/200 mL premix  Status:  Discontinued     1,000 mg 200 mL/hr over 60 Minutes Intravenous Every 24 hours 11/30/15 1951 12/02/15 0738   12/01/15 1000  ceFEPIme (MAXIPIME) 1 g in dextrose 5 % 50 mL IVPB  Status:  Discontinued     1 g 100 mL/hr over 30 Minutes Intravenous Every 12 hours 11/30/15 2123 12/02/15 0750   12/01/15 0900  levofloxacin (LEVAQUIN) IVPB 750 mg  Status:  Discontinued     750 mg 100 mL/hr over 90 Minutes Intravenous Every 48 hours 12/01/15 0834 12/02/15 0738   11/30/15 2300  acyclovir (ZOVIRAX) tablet 400 mg  400 mg Oral 3 times daily 11/30/15 2214     11/30/15 2200  fluconazole (DIFLUCAN) tablet 150 mg  Status:  Discontinued     150 mg Oral every 72 hours 11/30/15 2115 12/01/15 1153   11/30/15 2000  ceFEPIme (MAXIPIME) 1 g in dextrose 5 % 50 mL IVPB     1 g 100 mL/hr over 30 Minutes Intravenous  Once 11/30/15 1933 11/30/15 2031   11/30/15 2000  vancomycin (VANCOCIN) IVPB 1000 mg/200 mL premix     1,000 mg 200 mL/hr over 60 Minutes Intravenous  Once 11/30/15 1945 11/30/15 2132       Objective: Filed Vitals:   12/01/15 1356 12/01/15 2134 12/02/15 0605 12/02/15 1340  BP: 130/63 142/58 128/62 113/71  Pulse: 101 97 98 94   Temp: 97.9 F (36.6 C) 98.4 F (36.9 C) 98.4 F (36.9 C) 97.7 F (36.5 C)  TempSrc: Oral Oral Oral Oral  Resp: 20 20 20 20   Height:      Weight:      SpO2: 99% 100% 100% 99%    Intake/Output Summary (Last 24 hours) at 12/02/15 1756 Last data filed at 12/02/15 1624  Gross per 24 hour  Intake   4070 ml  Output   1750 ml  Net   2320 ml   Filed Weights   11/30/15 1605 11/30/15 2235  Weight: 77.111 kg (170 lb) 78.971 kg (174 lb 1.6 oz)    Examination: General exam: Appears comfortable  HEENT: PERRLA, oral mucosa moist, no sclera icterus or thrush-   Respiratory system: Clear to auscultation. Respiratory effort normal. Cardiovascular system: S1 & S2 heard, RRR.  No murmurs  Gastrointestinal system: Abdomen soft, non-tender, nondistended. Normal bowel sound. No organomegaly Central nervous system: Alert and oriented. No focal neurological deficits. Extremities: No cyanosis, clubbing or edema Skin: papules on lips and ulcers in gluteal cleft Psychiatry:  Mood & affect appropriate.     Data Reviewed: I have personally reviewed following labs and imaging studies  CBC:  Recent Labs Lab 11/30/15 1839 12/01/15 0410 12/01/15 1805 12/02/15 0400 12/02/15 1603  WBC 0.6* 0.7* 1.4* 2.0* 3.5*  NEUTROABS 0.1*  --   --   --   --   HGB 9.3* 7.6* 7.1* 7.0* 7.8*  HCT 27.9* 22.3* 20.8* 20.3* 22.8*  MCV 90.0 89.6 90.4 89.4 90.5  PLT 51* 50* 30* 31* 39*   Basic Metabolic Panel:  Recent Labs Lab 11/30/15 1839 12/01/15 0410 12/02/15 0400  NA 136 136 139  K 4.2 3.7 3.8  CL 106 110 113*  CO2 20* 19* 20*  GLUCOSE 168* 156* 113*  BUN 29* 19 11  CREATININE 1.58* 1.19* 1.05*  CALCIUM 9.0 7.7* 7.9*   GFR: Estimated Creatinine Clearance: 52.1 mL/min (by C-G formula based on Cr of 1.05). Liver Function Tests:  Recent Labs Lab 11/30/15 1839  AST 31  ALT 44  ALKPHOS 93  BILITOT 1.0  PROT 7.4  ALBUMIN 3.1*   No results for input(s): LIPASE, AMYLASE in the last 168  hours. No results for input(s): AMMONIA in the last 168 hours. Coagulation Profile: No results for input(s): INR, PROTIME in the last 168 hours. Cardiac Enzymes: No results for input(s): CKTOTAL, CKMB, CKMBINDEX, TROPONINI in the last 168 hours. BNP (last 3 results) No results for input(s): PROBNP in the last 8760 hours. HbA1C: No results for input(s): HGBA1C in the last 72 hours. CBG:  Recent Labs Lab 12/01/15 1654 12/01/15 2131 12/02/15 0800 12/02/15 1249 12/02/15 1658  GLUCAP 109*  133* 108* 216* 249*   Lipid Profile: No results for input(s): CHOL, HDL, LDLCALC, TRIG, CHOLHDL, LDLDIRECT in the last 72 hours. Thyroid Function Tests: No results for input(s): TSH, T4TOTAL, FREET4, T3FREE, THYROIDAB in the last 72 hours. Anemia Panel:  Recent Labs  12/01/15 1805  VITAMINB12 2200*  FOLATE 8.6  FERRITIN 2980*  TIBC 143*  IRON 26*  RETICCTPCT <0.4*   Urine analysis:    Component Value Date/Time   COLORURINE YELLOW 12/01/2015 1359   APPEARANCEUR CLOUDY* 12/01/2015 1359   LABSPEC 1.017 12/01/2015 1359   PHURINE 5.5 12/01/2015 1359   GLUCOSEU 250* 12/01/2015 1359   HGBUR SMALL* 12/01/2015 1359   BILIRUBINUR NEGATIVE 12/01/2015 1359   BILIRUBINUR NEG 05/31/2012 1047   KETONESUR NEGATIVE 12/01/2015 1359   PROTEINUR 30* 12/01/2015 1359   PROTEINUR NEG 05/31/2012 1047   UROBILINOGEN 0.2 05/31/2012 1047   UROBILINOGEN 1.0 10/20/2009 1857   NITRITE NEGATIVE 12/01/2015 1359   NITRITE POSITIVE 05/31/2012 1047   LEUKOCYTESUR NEGATIVE 12/01/2015 1359   Sepsis Labs: @LABRCNTIP (procalcitonin:4,lacticidven:4) ) Recent Results (from the past 240 hour(s))  Culture, blood (routine x 2)     Status: None (Preliminary result)   Collection Time: 11/30/15  7:50 PM  Result Value Ref Range Status   Specimen Description BLOOD LEFT CHEST PORT  Final   Special Requests BOTTLES DRAWN AEROBIC AND ANAEROBIC 10CC  Final   Culture   Final    NO GROWTH 1 DAY Performed at [facility]    Report Status PENDING  Incomplete  Culture, blood (routine x 2)     Status: None (Preliminary result)   Collection Time: 11/30/15  7:58 PM  Result Value Ref Range Status   Specimen Description LEFT ANTECUBITAL  Final   Special Requests BOTTLES DRAWN AEROBIC AND ANAEROBIC 5CC  Final   Culture   Final    NO GROWTH 1 DAY Performed at [facility]    Report Status PENDING  Incomplete  Urine culture     Status: Abnormal   Collection Time: 12/01/15  1:59 PM  Result Value Ref Range Status   Specimen Description URINE, CLEAN CATCH  Final   Special Requests Normal  Final   Culture (A)  Final    1,000 COLONIES/mL INSIGNIFICANT GROWTH Performed at [facility]    Report Status 12/02/2015 FINAL  Final  Gastrointestinal Panel by PCR , Stool     Status: Abnormal   Collection Time: 12/02/15  6:08 AM  Result Value Ref Range Status   Campylobacter species NOT DETECTED NOT DETECTED Final   Plesimonas shigelloides NOT DETECTED NOT DETECTED Final   Salmonella species NOT DETECTED NOT DETECTED Final   Yersinia enterocolitica NOT DETECTED NOT DETECTED Final   Vibrio species NOT DETECTED NOT DETECTED Final   Vibrio cholerae NOT DETECTED NOT DETECTED Final   Enteroaggregative E coli (EAEC) NOT DETECTED NOT DETECTED Final   Enteropathogenic E coli (EPEC) DETECTED (A) NOT DETECTED Final    Comment: CRITICAL RESULT CALLED TO, READ BACK BY AND VERIFIED WITH: CHRIS ZANDO 12/02/15 1057 SGD CRITICAL RESULT CALLED TO, READ BACK BY AND VERIFIED WITH: SEAY,C RN 12/02/15 @1107  ZANDO,C    Enterotoxigenic E coli (ETEC) NOT DETECTED NOT DETECTED Final   Shiga like toxin producing E coli (STEC) NOT DETECTED NOT DETECTED Final   E. coli O157 NOT DETECTED NOT DETECTED Final   Shigella/Enteroinvasive E coli (EIEC) NOT DETECTED NOT DETECTED Final   Cryptosporidium NOT DETECTED NOT DETECTED Final   Cyclospora cayetanensis NOT DETECTED  NOT DETECTED Final   Entamoeba histolytica NOT DETECTED  NOT DETECTED Final   Giardia lamblia NOT DETECTED NOT DETECTED Final   Adenovirus F40/41 NOT DETECTED NOT DETECTED Final   Astrovirus NOT DETECTED NOT DETECTED Final   Norovirus GI/GII NOT DETECTED NOT DETECTED Final   Rotavirus A NOT DETECTED NOT DETECTED Final   Sapovirus (I, II, IV, and V) NOT DETECTED NOT DETECTED Final         Radiology Studies: Dg Chest Port 1 View  12/02/2015  CLINICAL DATA:  70 year old female with acute shortness of breath today. EXAM: PORTABLE CHEST 1 VIEW COMPARISON:  11/30/2015 and prior chest radiographs FINDINGS: Upper limits normal heart size again noted. A left subclavian Port-A-Cath is present with tip overlying the mid SVC. Mild pulmonary vascular congestion noted. There is no evidence of focal airspace disease, pulmonary edema, suspicious pulmonary nodule/mass, pleural effusion, or pneumothorax. No acute bony abnormalities are identified. Surgical clips overlying the lower right chest identified. IMPRESSION: Mild pulmonary vascular congestion without other significant abnormality. Electronically Signed   By: Margarette Canada M.D.   On: 12/02/2015 11:23      Scheduled Meds: . acyclovir  400 mg Oral TID  . benzonatate  100 mg Oral TID  . chlorpheniramine-HYDROcodone  5 mL Oral Q12H  . diltiazem  120 mg Oral Daily  . feeding supplement (ENSURE ENLIVE)  237 mL Oral TID BM  . levofloxacin (LEVAQUIN) IV  750 mg Intravenous Daily  . [START ON 12/03/2015] pantoprazole  40 mg Oral Daily  . saccharomyces boulardii  250 mg Oral BID   Continuous Infusions: . sodium chloride 125 mL/hr at 12/02/15 1535     LOS: 2 days    Time spent in minutes: 49    Haliimaile, MD [facility] Pager: [email or web address] Password [facility] 12/02/2015, 5:56 PM

## 2015-12-02 NOTE — Consult Note (Signed)
Referral MD  Reason for Referral: Febrile neutropenia; status post chemotherapy for stage I breast cancer   Chief Complaint  Patient presents with  . Chest Pain  . Shortness of Breath  . chemo pt   : I discussed feeling so tired and weak.  HPI: [name] is well-known to me. She is a 70 year old African-American female. I've known her for probably 12-13 years. She presented with myeloma. 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 I think this is probably done about 10 years ago.  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 We started her on adjuvant chemotherapy. She's had 2 cycles of Taxotere/carboplatin. Her last dose was on 11/24/2015. She tolerated her first cycle incredibly well.  She again have problems on Tuesday. She got very weak. She had no energy. Showed a cough that was nonproductive. She came to the emergency room. She was found to be neutropenic. Her white cell count was 0.6. Her white cell count, nicely. It is 2.0 today. She is quite anemic with a hemoglobin of 7. Her platelet count is 31,000. Her renal function is doing okay.  All cultures have been negative. Her chest x-ray showed some atelectasis in the left lower lung.  She is on anabiotics. She is on Maxipime, vancomycin and Levaquin. She is having some diarrhea.  She does not have much of an appetite. She has some sores around her lips of the corner of her mouth. There is no mucositis. She has no rashes. She's had no bleeding.  She just feels quite tired.  Overall, her performance status is ECOG 1.    Past Medical History  Diagnosis Date  . Degenerative joint disease involving multiple joints 03/29/2011  . 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 Anemia   . GERD (gastroesophageal reflux disease)   . Vitamin D deficiency   . Hyperlipidemia   . Paget's bone disease   . History of multiple myeloma     chemo  XRT, Dr. Marin Olp  . DJD (degenerative joint disease)   . OA (osteoarthritis)   . Renal insufficiency     H/O resolved  . Herpes simplex esophagitis 2006  . HTN (hypertension) 2005  . Chronic pain of left knee   . History of blood transfusion     with first pregnancy  . Myeloma ([place]) 03/28/2011  . Personal history of adenomatous colonic polyps 11/02/2011    2 diminutive serrated adenomas 10/2011 Repeat colonoscopy 2018 (change from original recall based upon current guidelines 12/07/2014)    . Breast cancer, stage 1 ([place]) 09/09/2015  . PONV (postoperative nausea and vomiting)   :  Past Surgical History  Procedure Laterality Date  . 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 Knee arthroscopy  05/2008    left  . Total knee arthroplasty  10/11/2010    left  . Joint replacement Left   . Breast lumpectomy with radioactive seed and sentinel lymph node biopsy Right 09/22/2015    Procedure: BREAST LUMPECTOMY WITH RADIOACTIVE SEED AND SENTINEL LYMPH NODE BIOPSY;  Surgeon: Excell Seltzer, MD;  Location: [facility];  Service: General;  Laterality: Right;  :   Current facility-administered medications:  .  0.9 %  sodium chloride infusion, , Intravenous, Continuous, Etta Quill, DO, Last Rate: 125 mL/hr at 12/02/15 0604 .  acyclovir (  ZOVIRAX) tablet 400 mg, 400 mg, Oral, TID, Berton Mount, RPH, 400 mg at 12/01/15 2109 .  benzonatate (TESSALON) capsule 100 mg, 100 mg, Oral, TID, Debbe Odea, MD, 100 mg at 12/01/15 2109 .  ceFEPIme (MAXIPIME) 1 g in dextrose 5 % 50 mL IVPB, 1 g, Intravenous, Q12H, Berton Mount, RPH, 1 g at 12/01/15 2109 .  chlorpheniramine-HYDROcodone (TUSSIONEX) 10-8 MG/5ML suspension 5 mL, 5 mL, Oral, Q12H, Debbe Odea, MD, 5 mL at 12/01/15 1734 .  diltiazem (CARDIZEM CD) 24 hr capsule 120 mg, 120 mg, Oral, Daily, Debbe Odea, MD, 120 mg at 12/01/15 1357 .  feeding supplement (ENSURE ENLIVE) (ENSURE  ENLIVE) liquid 237 mL, 237 mL, Oral, TID BM, Debbe Odea, MD, 237 mL at 12/01/15 2109 .  guaiFENesin-dextromethorphan (ROBITUSSIN DM) 100-10 MG/5ML syrup 5 mL, 5 mL, Oral, Q4H PRN, Etta Quill, DO, 5 mL at 12/01/15 0020 .  HYDROcodone-acetaminophen (NORCO/VICODIN) 5-325 MG per tablet 1-2 tablet, 1-2 tablet, Oral, Q4H PRN, Etta Quill, DO .  levofloxacin (LEVAQUIN) IVPB 750 mg, 750 mg, Intravenous, Q48H, Terri L Green, RPH, 750 mg at 12/01/15 0947 .  menthol-cetylpyridinium (CEPACOL) lozenge 3 mg, 1 lozenge, Oral, PRN, Davonna Belling, MD, 3 mg at 11/30/15 2050 .  menthol-cetylpyridinium (CEPACOL) lozenge 3 mg, 1 lozenge, Oral, PRN, Debbe Odea, MD .  ondansetron (ZOFRAN) tablet 8 mg, 8 mg, Oral, BID PRN, Etta Quill, DO .  pantoprazole (PROTONIX) EC tablet 80 mg, 80 mg, Oral, Daily, Etta Quill, DO, 80 mg at 12/01/15 0946 .  prochlorperazine (COMPAZINE) tablet 10 mg, 10 mg, Oral, Q6H PRN, Etta Quill, DO .  saccharomyces boulardii (FLORASTOR) capsule 250 mg, 250 mg, Oral, BID, Debbe Odea, MD, 250 mg at 12/01/15 2109 .  sodium chloride flush (NS) 0.9 % injection 10-40 mL, 10-40 mL, Intracatheter, PRN, Etta Quill, DO, 10 mL at 12/01/15 0410 .  vancomycin (VANCOCIN) IVPB 1000 mg/200 mL premix, 1,000 mg, Intravenous, Q24H, Berton Mount, RPH, 1,000 mg at 12/01/15 1707:  . acyclovir  400 mg Oral TID  . benzonatate  100 mg Oral TID  . ceFEPime (MAXIPIME) IV  1 g Intravenous Q12H  . chlorpheniramine-HYDROcodone  5 mL Oral Q12H  . diltiazem  120 mg Oral Daily  . feeding supplement (ENSURE ENLIVE)  237 mL Oral TID BM  . levofloxacin (LEVAQUIN) IV  750 mg Intravenous Q48H  . pantoprazole  80 mg Oral Daily  . saccharomyces boulardii  250 mg Oral BID  . vancomycin  1,000 mg Intravenous Q24H  :  No Known Allergies:  Family History  Problem Relation Age of Onset  . Colon cancer Neg Hx   . Lung cancer Father     smoker  . Throat cancer Brother   . Heart attack  Mother   . Diabetes Mother   . Diabetes Sister   . Liver cancer Brother   . Pancreatic cancer Sister   . Heart attack Mother   . Rheum arthritis Sister   :  Social History   Social History  . Marital Status: Legally Separated    Spouse Name: N/A  . Number of Children: 3  . Years of Education: N/A   Occupational History  . disability    Social History Main Topics  . Smoking status: Never Smoker   . Smokeless tobacco: Never Used     Comment: never used tobacco  . Alcohol Use: No  . Drug Use: No  . Sexual Activity: No   Other Topics  Concern  . Not on file   Social History Narrative   Workouts:   Treadmill 3x/ week   Arm weights daily         :  Pertinent items are noted in HPI.  Exam: Patient Vitals for the past 24 hrs:  BP Temp Temp src Pulse Resp SpO2  12/02/15 0605 128/62 mmHg 98.4 F (36.9 C) Oral 98 20 100 %  12/01/15 2134 (!) 142/58 mmHg 98.4 F (36.9 C) Oral 97 20 100 %  12/01/15 1356 130/63 mmHg 97.9 F (36.6 C) Oral (!) 101 20 99 %  12/01/15 1054 - - - (!) 103 18 -    As above    Recent Labs  12/01/15 1805 12/02/15 0400  WBC 1.4* 2.0*  HGB 7.1* 7.0*  HCT 20.8* 20.3*  PLT 30* 31*    Recent Labs  12/01/15 0410 12/02/15 0400  NA 136 139  K 3.7 3.8  CL 110 113*  CO2 19* 20*  GLUCOSE 156* 113*  BUN 19 11  CREATININE 1.19* 1.05*  CALCIUM 7.7* 7.9*    Blood smear review:  None  Pathology: None     Assessment and Plan:  This [name] is a 70 year old African American female with stage I ductal carcinoma the right breast. She is on Taxotere/carboplatinum. She's had 2 cycles of chemotherapy so far.  I was surprised that she has done so poorly after her second cycle of treatment. Her white cell count is coming up nicely. She is still quite anemic.  Given that her cultures so far negative, I'll probably stop the Levaquin and vancomycin. I think that the Maxipime is appropriate.  I will like to try to hold on transfusing her. I think  that her hemoglobin will come up on its own.  I clearly will have to adjust her dose of treatment with the next 2 cycles.  I suspect she probably will need another couple days in the hospital. Again, we have to get her hemoglobin up a little bit more. I think that she is iron deficient area and as such, I will give her dose of IV iron.  I very much appreciate all the great help that she is getting from everybody up [facility] [address]. She is thankful or all the compassion and care that is being given to her.  Pete E.  Romans 12:12

## 2015-12-02 NOTE — Progress Notes (Signed)
CRITICAL VALUE ALERT  Critical value received:  GI Panel- Positive Enteropathogenic E.Coli  Date of notification: 12/02/15  Time of notification:  [id number]  Critical value read back:Yes.    Nurse who received alert:  Willene Hatchet, RN  MD notified (1st page): Wynelle Cleveland, MD- In person assessing patient  Time of first page:  1105  MD notified (2nd page):  Time of second page:  Responding MD:  Wynelle Cleveland  Time MD responded:  1105

## 2015-12-03 ENCOUNTER — Telehealth: Payer: Self-pay

## 2015-12-03 ENCOUNTER — Ambulatory Visit: Payer: Medicare Other

## 2015-12-03 LAB — RESPIRATORY PANEL BY PCR
ADENOVIRUS-RVPPCR: NOT DETECTED
BORDETELLA PERTUSSIS-RVPCR: NOT DETECTED
CHLAMYDOPHILA PNEUMONIAE-RVPPCR: NOT DETECTED
CORONAVIRUS HKU1-RVPPCR: NOT DETECTED
CORONAVIRUS OC43-RVPPCR: NOT DETECTED
Coronavirus 229E: NOT DETECTED
Coronavirus NL63: NOT DETECTED
INFLUENZA A H1 2009-RVPPR: NOT DETECTED
INFLUENZA A H1-RVPPCR: NOT DETECTED
INFLUENZA A H3-RVPPCR: NOT DETECTED
INFLUENZA A-RVPPCR: NOT DETECTED
Influenza B: NOT DETECTED
MYCOPLASMA PNEUMONIAE-RVPPCR: NOT DETECTED
Metapneumovirus: NOT DETECTED
Parainfluenza Virus 1: NOT DETECTED
Parainfluenza Virus 2: NOT DETECTED
Parainfluenza Virus 3: NOT DETECTED
Parainfluenza Virus 4: NOT DETECTED
RESPIRATORY SYNCYTIAL VIRUS-RVPPCR: NOT DETECTED
Rhinovirus / Enterovirus: NOT DETECTED

## 2015-12-03 LAB — BASIC METABOLIC PANEL
ANION GAP: 7 (ref 5–15)
BUN: 16 mg/dL (ref 6–20)
CALCIUM: 8.2 mg/dL — AB (ref 8.9–10.3)
CO2: 19 mmol/L — ABNORMAL LOW (ref 22–32)
Chloride: 114 mmol/L — ABNORMAL HIGH (ref 101–111)
Creatinine, Ser: 0.99 mg/dL (ref 0.44–1.00)
GFR, EST NON AFRICAN AMERICAN: 57 mL/min — AB (ref >60)
GLUCOSE: 182 mg/dL — AB (ref 65–99)
POTASSIUM: 3.9 mmol/L (ref 3.5–5.1)
Sodium: 140 mmol/L (ref 135–145)

## 2015-12-03 LAB — CBC
HEMATOCRIT: 21.6 % — AB (ref 36.0–46.0)
Hemoglobin: 7.2 g/dL — ABNORMAL LOW (ref 12.0–15.0)
MCH: 30.4 pg (ref 26.0–34.0)
MCHC: 33.3 g/dL (ref 30.0–36.0)
MCV: 91.1 fL (ref 78.0–100.0)
PLATELETS: 40 10*3/uL — AB (ref 150–400)
RBC: 2.37 MIL/uL — AB (ref 3.87–5.11)
RDW: 15.4 % (ref 11.5–15.5)
WBC: 6.7 10*3/uL (ref 4.0–10.5)

## 2015-12-03 LAB — GLUCOSE, CAPILLARY
GLUCOSE-CAPILLARY: 113 mg/dL — AB (ref 65–99)
Glucose-Capillary: 187 mg/dL — ABNORMAL HIGH (ref 65–99)

## 2015-12-03 MED ORDER — ZINC OXIDE 11.3 % EX CREA
TOPICAL_CREAM | CUTANEOUS | Status: DC | PRN
  Filled 2015-12-03: qty 56

## 2015-12-03 MED ORDER — HEPARIN SOD (PORK) LOCK FLUSH 100 UNIT/ML IV SOLN
500.0000 [IU] | INTRAVENOUS | Status: AC | PRN
  Administered 2015-12-03: 500 [IU]

## 2015-12-03 MED ORDER — BENZONATATE 100 MG PO CAPS: 100.0000 mg | ORAL_CAPSULE | Freq: Three times a day (TID) | ORAL | Status: DC

## 2015-12-03 MED ORDER — ENSURE ENLIVE PO LIQD: 237.0000 mL | Freq: Three times a day (TID) | ORAL | Status: DC

## 2015-12-03 MED ORDER — LEVOFLOXACIN 750 MG PO TABS
750.0000 mg | ORAL_TABLET | Freq: Every day | ORAL | Status: DC
  Administered 2015-12-03: 750 mg via ORAL
  Filled 2015-12-03: qty 1

## 2015-12-03 MED ORDER — VALACYCLOVIR HCL 500 MG PO TABS
1000.0000 mg | ORAL_TABLET | Freq: Two times a day (BID) | ORAL | Status: DC
Start: 2015-12-03 — End: 2015-12-03
  Administered 2015-12-03: 1000 mg via ORAL
  Filled 2015-12-03: qty 2

## 2015-12-03 MED ORDER — SACCHAROMYCES BOULARDII 250 MG PO CAPS: 250.0000 mg | ORAL_CAPSULE | Freq: Two times a day (BID) | ORAL | Status: DC

## 2015-12-03 MED ORDER — ZINC OXIDE 11.3 % EX CREA: 1.0000 "application " | TOPICAL_CREAM | CUTANEOUS | Status: DC | PRN

## 2015-12-03 MED ORDER — HYDROCOD POLST-CPM POLST ER 10-8 MG/5ML PO SUER: 5.0000 mL | Freq: Two times a day (BID) | ORAL | Status: DC | PRN

## 2015-12-03 MED ORDER — VALACYCLOVIR HCL 1 G PO TABS: 1000.0000 mg | ORAL_TABLET | Freq: Two times a day (BID) | ORAL | Status: DC

## 2015-12-03 MED ORDER — LEVOFLOXACIN 750 MG PO TABS: 750.0000 mg | ORAL_TABLET | Freq: Every day | ORAL | Status: DC

## 2015-12-03 NOTE — Progress Notes (Signed)
Pt had no preference to [facility]. Referral given to [facility] in house rep.

## 2015-12-03 NOTE — Discharge Summary (Signed)
Physician Discharge Summary  [name]:[id number] DOB: Jan 29, 1946 DOA: 11/30/2015  PCP: [name], MD  Admit date: 11/30/2015 Discharge date: 12/05/2015  Admitted From: home  Disposition:  home   Recommendations for Outpatient Follow-up:  1. F/u CBC  Home Health:   ordered Equipment/Devices:  ordered    Discharge Condition:  stable   CODE STATUS:  Full code   Diet recommendation:  Regular diet Consultations:  oncology    Discharge Diagnoses:  Principal Problem:   Cough Active Problems:   Diarrhea   E. coli gastroenteritis   HSV-1 (herpes simplex virus 1) infection   Dehydration   Anorexia   Loss of weight   Protein-calorie malnutrition, severe   Breast cancer, stage 1 (HCC)   Pancytopenia due to chemotherapy     Chronic kidney disease, stage 3    Subjective: Walking down the hall. Diarrhea is minimal. Eating and drinking. Mild pain in buttock area. Mouth sore and tongue feel better. Cough has improved with medications that I ordered but is not gone. Non-productive.    Brief Summary: 70 y/o with right breast CA on chemotherapy who just completed her 2nd cycle on 7/5. She presents to the hospital for a cough and chest pain. Cough is productive of clear sputum. She has had sweats and chills. She has had at least 3 loose stools a day since her chemo and her appetite has been poor. She has lost weight but cannot quantify. She has sores on her lips and in her gluteal cleft. She has been applying hydrocortisone ointment in her gluteal cleft. She has dysuria as well. Noted to be pancytopenic in the ER.  Admitted for pneumonia and started on Vanc, Cefepime and Acyclovir. She told the admitted doc that she had burning when she urinated and was put on Diflucan for possible yeast infection.   Hospital Course:   Principal Problem:  Cough - ? Acute bronchitis - has only atelectasis on CXR obtained on admission- no infiltrates on repeat CXR   - respiratory viral  panel is NEGATIVE - Levaquin,  tussionex, tessalon, cepacol   Active Problems: Diarrhea - send stool pathogen panel- has enterotoxigenic e coli - Levaquin for 1 more week  Dehydration - due to diarrhea and loss of appetite - resolved with IVF- eating much better now- diarrhea resolved  Pancytopenia- Breast cancer on chemo -from chemo - WBC count nor normal - consulted Dr Marin Olp - did not require blood or platelet transfusions   HSV-1 (herpes simplex virus 1) infection - lesions on lips - also has HSV 2- has some papules and now ulcers in gluteal cleft which are tender- no vesicles - Acyclovir>> changed to Valtrex today for 1 more week  Dysuria - UA negative- likely due to concentrated urine  Anorexia- Loss of weight- Protein-calorie malnutrition, severe - added supplements - anorexia much better for at least the past 2 days  Chronic kidney disease, stage 3 - stable  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     chlorpheniramine-HYDROcodone 10-8 MG/5ML Suer  Commonly known as:  TUSSIONEX  Take 5 mLs by mouth every 12 (twelve) hours as needed for cough.     dexamethasone 4 MG tablet  Commonly known as:  DECADRON  Take 2 tablets twice a day for 4 days.  Start day before each chemotherapy cycle.     dexlansoprazole 60 MG capsule  Commonly known as:  DEXILANT  Take 60 mg by mouth daily.     diltiazem 240 MG 24 hr capsule   Commonly known as:  CARDIZEM CD  Take 1 capsule (240 mg total) by mouth daily.     feeding supplement (ENSURE ENLIVE) Liqd  Take 237 mLs by mouth 3 (three) times daily between meals.     HYDROcodone-acetaminophen 5-325 MG tablet  Commonly known as:  NORCO/VICODIN  Take 1-2 tablets by mouth every 4 (four) hours as needed for moderate pain or severe pain.     levofloxacin 750 MG tablet  Commonly known as:  LEVAQUIN  Take 1 tablet (750 mg total) by mouth daily.     lidocaine-prilocaine cream  Commonly known as:  EMLA  Apply to affected area once     metFORMIN 500 MG tablet  Commonly known as:  GLUCOPHAGE  Take 500 mg by mouth 2 (two) times daily with a meal.     ondansetron 8 MG tablet  Commonly known as:  ZOFRAN  Take 1 tablet (8 mg total) by mouth 2 (two) times daily as needed for refractory nausea / vomiting. Start on day 3 after chemo.     prochlorperazine 10 MG tablet  Commonly known as:  COMPAZINE  Take 1 tablet (10 mg total) by mouth every 6 (six) hours as needed (Nausea or vomiting).     saccharomyces boulardii 250 MG capsule  Commonly known as:  FLORASTOR  Take 1 capsule (250 mg total) by mouth 2 (two) times daily.     valACYclovir 1000 MG tablet  Commonly known as:  VALTREX  Take 1 tablet (1,000 mg total) by mouth 2 (two) times daily.     Vitamin D (Ergocalciferol) 50000 units Caps capsule  Commonly known as:  DRISDOL  Take 50,000 Units by mouth every 7 (seven) days.     zinc oxide 11.3 % Crea cream  Commonly known as:  BALMEX  Apply 1 application topically as needed (apply as needed to gluteal lesions).     ZOMETA IV  Inject into the vein. Due now       Follow-up Information    Follow up with [name], MD. Schedule an appointment as soon as possible for a visit in 1 week.   Specialty:  Internal Medicine   Contact information:   [address] [place] [address] [phone]      No Known Allergies   Procedures/Studies:  Dg Chest 2  View  11/30/2015  CLINICAL DATA:  Worsening chest pain and shortness of breath. Breast cancer. Receiving chemotherapy. EXAM: CHEST  2 VIEW COMPARISON:  10/29/2015, 10/27/2012 FINDINGS: Postop changes over the right breast with surgical clips noted. Left subclavian power port catheter tip proximal SVC as before. Normal heart size and vascularity. Minimal left basilar streaky opacity, atelectasis is favored over pneumonia. Right lung remains clear. Artifact over the  right apex. No edema, significant effusion or pneumothorax. Trachea is midline. Degenerative changes of the spine and left shoulder. IMPRESSION: Streaky left lower lobe opacity compatible with atelectasis, less likely developing pneumonia. Stable postoperative findings as above Electronically Signed   By: Jerilynn Mages.  Shick M.D.   On: 11/30/2015 16:57   Dg Chest Port 1 View  12/02/2015  CLINICAL DATA:  70 year old female with acute shortness of breath today. EXAM: PORTABLE CHEST 1 VIEW COMPARISON:  11/30/2015 and prior chest radiographs FINDINGS: Upper limits normal heart size again noted. A left subclavian Port-A-Cath is present with tip overlying the mid SVC. Mild pulmonary vascular congestion noted. There is no evidence of focal airspace disease, pulmonary edema, suspicious pulmonary nodule/mass, pleural effusion, or pneumothorax. No acute bony abnormalities are identified. Surgical clips overlying the lower right chest identified. IMPRESSION: Mild pulmonary vascular congestion without other significant abnormality. Electronically Signed   By: Margarette Canada M.D.   On: 12/02/2015 11:23       Discharge Exam: Filed Vitals:   12/02/15 2112 12/03/15 0527  BP: 142/68 126/72  Pulse: 92 86  Temp: 98 F (36.7 C) 97.6 F (36.4 C)  Resp: 20 18   Filed Vitals:   12/02/15 0605 12/02/15 1340 12/02/15 2112 12/03/15 0527  BP: 128/62 113/71 142/68 126/72  Pulse: 98 94 92 86  Temp: 98.4 F (36.9 C) 97.7 F (36.5 C) 98 F (36.7 C) 97.6 F (36.4 C)   TempSrc: Oral Oral Oral Oral  Resp: 20 20 20 18   Height:      Weight:      SpO2: 100% 99% 99% 98%    General: Pt is alert, awake, not in acute distress Cardiovascular: RRR, S1/S2 +, no rubs, no gallops Respiratory: CTA bilaterally, no wheezing, no rhonchi Abdominal: Soft, NT, ND, bowel sounds + Extremities: no edema, no cyanosis Skin: ulcers in gluteal cleft consistent - vesicles around lips healing  The results of significant diagnostics from this hospitalization (including imaging, microbiology, ancillary and laboratory) are listed below for reference.     Microbiology: Recent Results (from the past 240 hour(s))  Culture, blood (routine x 2)     Status: None (Preliminary result)   Collection Time: 11/30/15  7:50 PM  Result Value Ref Range Status   Specimen Description BLOOD LEFT CHEST PORT  Final   Special Requests BOTTLES DRAWN AEROBIC AND ANAEROBIC 10CC  Final   Culture   Final    NO GROWTH 3 DAYS Performed at [facility]    Report Status PENDING  Incomplete  Culture, blood (routine x 2)     Status: None (Preliminary result)   Collection Time: 11/30/15  7:58 PM  Result Value Ref Range Status   Specimen Description LEFT ANTECUBITAL  Final   Special Requests BOTTLES DRAWN AEROBIC AND ANAEROBIC 5CC  Final   Culture   Final    NO GROWTH 3 DAYS Performed at [facility]    Report Status PENDING  Incomplete  Respiratory Panel by PCR     Status: None   Collection Time: 12/01/15  1:59 PM  Result Value Ref Range Status   Adenovirus NOT DETECTED NOT DETECTED Final   Coronavirus 229E NOT DETECTED NOT DETECTED Final   Coronavirus HKU1 NOT DETECTED NOT DETECTED Final   Coronavirus NL63 NOT DETECTED NOT DETECTED Final   Coronavirus OC43 NOT DETECTED NOT DETECTED Final   Metapneumovirus NOT DETECTED NOT DETECTED Final   Rhinovirus / Enterovirus NOT DETECTED NOT DETECTED Final   Influenza A NOT  DETECTED NOT DETECTED Final   Influenza A H1 NOT DETECTED NOT  DETECTED Final   Influenza A H1 2009 NOT DETECTED NOT DETECTED Final   Influenza A H3 NOT DETECTED NOT DETECTED Final   Influenza B NOT DETECTED NOT DETECTED Final   Parainfluenza Virus 1 NOT DETECTED NOT DETECTED Final   Parainfluenza Virus 2 NOT DETECTED NOT DETECTED Final   Parainfluenza Virus 3 NOT DETECTED NOT DETECTED Final   Parainfluenza Virus 4 NOT DETECTED NOT DETECTED Final   Respiratory Syncytial Virus NOT DETECTED NOT DETECTED Final   Bordetella pertussis NOT DETECTED NOT DETECTED Final   Chlamydophila pneumoniae NOT DETECTED NOT DETECTED Final   Mycoplasma pneumoniae NOT DETECTED NOT DETECTED Final    Comment: Performed at [facility]  Urine culture     Status: Abnormal   Collection Time: 12/01/15  1:59 PM  Result Value Ref Range Status   Specimen Description URINE, CLEAN CATCH  Final   Special Requests Normal  Final   Culture (A)  Final    1,000 COLONIES/mL INSIGNIFICANT GROWTH Performed at [facility]    Report Status 12/02/2015 FINAL  Final  Gastrointestinal Panel by PCR , Stool     Status: Abnormal   Collection Time: 12/02/15  6:08 AM  Result Value Ref Range Status   Campylobacter species NOT DETECTED NOT DETECTED Final   Plesimonas shigelloides NOT DETECTED NOT DETECTED Final   Salmonella species NOT DETECTED NOT DETECTED Final   Yersinia enterocolitica NOT DETECTED NOT DETECTED Final   Vibrio species NOT DETECTED NOT DETECTED Final   Vibrio cholerae NOT DETECTED NOT DETECTED Final   Enteroaggregative E coli (EAEC) NOT DETECTED NOT DETECTED Final   Enteropathogenic E coli (EPEC) DETECTED (A) NOT DETECTED Final    Comment: CRITICAL RESULT CALLED TO, READ BACK BY AND VERIFIED WITH: CHRIS ZANDO 12/02/15 1057 SGD CRITICAL RESULT CALLED TO, READ BACK BY AND VERIFIED WITH: SEAY,C RN 12/02/15 @1107  ZANDO,C    Enterotoxigenic E coli (ETEC) NOT DETECTED NOT DETECTED Final   Shiga like toxin producing E coli (STEC) NOT DETECTED NOT DETECTED Final   E.  coli O157 NOT DETECTED NOT DETECTED Final   Shigella/Enteroinvasive E coli (EIEC) NOT DETECTED NOT DETECTED Final   Cryptosporidium NOT DETECTED NOT DETECTED Final   Cyclospora cayetanensis NOT DETECTED NOT DETECTED Final   Entamoeba histolytica NOT DETECTED NOT DETECTED Final   Giardia lamblia NOT DETECTED NOT DETECTED Final   Adenovirus F40/41 NOT DETECTED NOT DETECTED Final   Astrovirus NOT DETECTED NOT DETECTED Final   Norovirus GI/GII NOT DETECTED NOT DETECTED Final   Rotavirus A NOT DETECTED NOT DETECTED Final   Sapovirus (I, II, IV, and V) NOT DETECTED NOT DETECTED Final     Labs: BNP (last 3 results) No results for input(s): BNP in the last 8760 hours. Basic Metabolic Panel:  Recent Labs Lab 11/30/15 1839 12/01/15 0410 12/02/15 0400 12/03/15 0510  NA 136 136 139 140  K 4.2 3.7 3.8 3.9  CL 106 110 113* 114*  CO2 20* 19* 20* 19*  GLUCOSE 168* 156* 113* 182*  BUN 29* 19 11 16   CREATININE 1.58* 1.19* 1.05* 0.99  CALCIUM 9.0 7.7* 7.9* 8.2*   Liver Function Tests:  Recent Labs Lab 11/30/15 1839  AST 31  ALT 44  ALKPHOS 93  BILITOT 1.0  PROT 7.4  ALBUMIN 3.1*   No results for input(s): LIPASE, AMYLASE in the last 168 hours. No results for input(s): AMMONIA in the last 168 hours. CBC:  Recent Labs Lab 11/30/15 1839 12/01/15 0410 12/01/15 1805 12/02/15 0400 12/02/15 1603 12/03/15 0510  WBC 0.6* 0.7* 1.4* 2.0* 3.5* 6.7  NEUTROABS 0.1*  --   --   --   --   --   HGB 9.3* 7.6* 7.1* 7.0* 7.8* 7.2*  HCT 27.9* 22.3* 20.8* 20.3* 22.8* 21.6*  MCV 90.0 89.6 90.4 89.4 90.5 91.1  PLT 51* 50* 30* 31* 39* 40*   Cardiac Enzymes: No results for input(s): CKTOTAL, CKMB, CKMBINDEX, TROPONINI in the last 168 hours. BNP: Invalid input(s): POCBNP CBG:  Recent Labs Lab 12/02/15 1249 12/02/15 1658 12/02/15 2104 12/03/15 0803 12/03/15 1201  GLUCAP 216* 249* 180* 187* 113*   D-Dimer No results for input(s): DDIMER in the last 72 hours. Hgb A1c No results for  input(s): HGBA1C in the last 72 hours. Lipid Profile No results for input(s): CHOL, HDL, LDLCALC, TRIG, CHOLHDL, LDLDIRECT in the last 72 hours. Thyroid function studies No results for input(s): TSH, T4TOTAL, T3FREE, THYROIDAB in the last 72 hours.  Invalid input(s): FREET3 Anemia work up No results for input(s): VITAMINB12, FOLATE, FERRITIN, TIBC, IRON, RETICCTPCT in the last 72 hours. Urinalysis    Component Value Date/Time   COLORURINE YELLOW 12/01/2015 1359   APPEARANCEUR CLOUDY* 12/01/2015 1359   LABSPEC 1.017 12/01/2015 1359   PHURINE 5.5 12/01/2015 1359   GLUCOSEU 250* 12/01/2015 1359   HGBUR SMALL* 12/01/2015 1359   BILIRUBINUR NEGATIVE 12/01/2015 1359   BILIRUBINUR NEG 05/31/2012 1047   KETONESUR NEGATIVE 12/01/2015 1359   PROTEINUR 30* 12/01/2015 1359   PROTEINUR NEG 05/31/2012 1047   UROBILINOGEN 0.2 05/31/2012 1047   UROBILINOGEN 1.0 10/20/2009 1857   NITRITE NEGATIVE 12/01/2015 1359   NITRITE POSITIVE 05/31/2012 1047   LEUKOCYTESUR NEGATIVE 12/01/2015 1359   Sepsis Labs Invalid input(s): PROCALCITONIN,  WBC,  LACTICIDVEN Microbiology Recent Results (from the past 240 hour(s))  Culture, blood (routine x 2)     Status: None (Preliminary result)   Collection Time: 11/30/15  7:50 PM  Result Value Ref Range Status   Specimen Description BLOOD LEFT CHEST PORT  Final   Special Requests BOTTLES DRAWN AEROBIC AND ANAEROBIC 10CC  Final   Culture   Final    NO GROWTH 3 DAYS Performed at [facility]    Report Status PENDING  Incomplete  Culture, blood (routine x 2)     Status: None (Preliminary result)   Collection Time: 11/30/15  7:58 PM  Result Value Ref Range Status   Specimen Description LEFT ANTECUBITAL  Final   Special Requests BOTTLES DRAWN AEROBIC AND ANAEROBIC 5CC  Final   Culture   Final    NO GROWTH 3 DAYS Performed at [facility]    Report Status PENDING  Incomplete  Respiratory Panel by PCR     Status: None   Collection Time:  12/01/15  1:59 PM  Result Value Ref Range Status   Adenovirus NOT DETECTED NOT DETECTED Final   Coronavirus 229E NOT DETECTED NOT DETECTED Final   Coronavirus HKU1 NOT DETECTED NOT DETECTED Final   Coronavirus NL63 NOT DETECTED NOT DETECTED Final   Coronavirus OC43 NOT DETECTED NOT DETECTED Final   Metapneumovirus NOT DETECTED NOT DETECTED Final   Rhinovirus / Enterovirus NOT DETECTED NOT DETECTED Final   Influenza A NOT DETECTED NOT DETECTED Final   Influenza A H1 NOT DETECTED NOT DETECTED Final   Influenza A H1 2009 NOT DETECTED NOT DETECTED Final   Influenza A H3 NOT DETECTED NOT DETECTED Final   Influenza  B NOT DETECTED NOT DETECTED Final   Parainfluenza Virus 1 NOT DETECTED NOT DETECTED Final   Parainfluenza Virus 2 NOT DETECTED NOT DETECTED Final   Parainfluenza Virus 3 NOT DETECTED NOT DETECTED Final   Parainfluenza Virus 4 NOT DETECTED NOT DETECTED Final   Respiratory Syncytial Virus NOT DETECTED NOT DETECTED Final   Bordetella pertussis NOT DETECTED NOT DETECTED Final   Chlamydophila pneumoniae NOT DETECTED NOT DETECTED Final   Mycoplasma pneumoniae NOT DETECTED NOT DETECTED Final    Comment: Performed at [facility]  Urine culture     Status: Abnormal   Collection Time: 12/01/15  1:59 PM  Result Value Ref Range Status   Specimen Description URINE, CLEAN CATCH  Final   Special Requests Normal  Final   Culture (A)  Final    1,000 COLONIES/mL INSIGNIFICANT GROWTH Performed at [facility]    Report Status 12/02/2015 FINAL  Final  Gastrointestinal Panel by PCR , Stool     Status: Abnormal   Collection Time: 12/02/15  6:08 AM  Result Value Ref Range Status   Campylobacter species NOT DETECTED NOT DETECTED Final   Plesimonas shigelloides NOT DETECTED NOT DETECTED Final   Salmonella species NOT DETECTED NOT DETECTED Final   Yersinia enterocolitica NOT DETECTED NOT DETECTED Final   Vibrio species NOT DETECTED NOT DETECTED Final   Vibrio cholerae NOT DETECTED  NOT DETECTED Final   Enteroaggregative E coli (EAEC) NOT DETECTED NOT DETECTED Final   Enteropathogenic E coli (EPEC) DETECTED (A) NOT DETECTED Final    Comment: CRITICAL RESULT CALLED TO, READ BACK BY AND VERIFIED WITH: CHRIS ZANDO 12/02/15 1057 SGD CRITICAL RESULT CALLED TO, READ BACK BY AND VERIFIED WITH: SEAY,C RN 12/02/15 @1107  ZANDO,C    Enterotoxigenic E coli (ETEC) NOT DETECTED NOT DETECTED Final   Shiga like toxin producing E coli (STEC) NOT DETECTED NOT DETECTED Final   E. coli O157 NOT DETECTED NOT DETECTED Final   Shigella/Enteroinvasive E coli (EIEC) NOT DETECTED NOT DETECTED Final   Cryptosporidium NOT DETECTED NOT DETECTED Final   Cyclospora cayetanensis NOT DETECTED NOT DETECTED Final   Entamoeba histolytica NOT DETECTED NOT DETECTED Final   Giardia lamblia NOT DETECTED NOT DETECTED Final   Adenovirus F40/41 NOT DETECTED NOT DETECTED Final   Astrovirus NOT DETECTED NOT DETECTED Final   Norovirus GI/GII NOT DETECTED NOT DETECTED Final   Rotavirus A NOT DETECTED NOT DETECTED Final   Sapovirus (I, II, IV, and V) NOT DETECTED NOT DETECTED Final     Time coordinating discharge: Over 30 minutes  SIGNED:   Debbe Odea, MD  [facility] 12/05/2015, 4:16 PM Pager   If 7PM-7AM, please contact night-coverage [email or web address] Password [email or web address]

## 2015-12-03 NOTE — Telephone Encounter (Signed)
Received call from pt notifying us that she is being d/c from hospital today. She states Dr Marin Olp told her she will not need to come in on Tuesday, 7/18. Wanted to let us know. States Dr Marin Olp will r/s those appts when he returns to office Monday. dph

## 2015-12-03 NOTE — Care Management Important Message (Signed)
Important Message  Patient Details IM Letter given to Important Message  Patient Details IM Letter given to [name]/Case Manager to present to Patient Name: [name] MRN: [id number] Date of Birth: [birth year]   Medicare Important Message Given:  Yes    Camillo Flaming 12/03/2015, 9:25 AM Name: [name] MRN: [id number] Date of Birth: [birth year]   Medicare Important Message Given:  Roel Cluck 12/03/2015, 9:23 AMImportant Message  Patient Details  Name: [name] MRN: [id number] Date of Birth: 1945/10/09   Medicare Important Message Given:  Yes    Camillo Flaming 12/03/2015, 9:23 AM

## 2015-12-05 DIAGNOSIS — J189 Pneumonia, unspecified organism: Secondary | ICD-10-CM | POA: Diagnosis not present

## 2015-12-05 DIAGNOSIS — Z9481 Bone marrow transplant status: Secondary | ICD-10-CM | POA: Diagnosis not present

## 2015-12-05 DIAGNOSIS — D701 Agranulocytosis secondary to cancer chemotherapy: Secondary | ICD-10-CM | POA: Diagnosis not present

## 2015-12-05 DIAGNOSIS — B009 Herpesviral infection, unspecified: Secondary | ICD-10-CM

## 2015-12-05 DIAGNOSIS — C50911 Malignant neoplasm of unspecified site of right female breast: Secondary | ICD-10-CM | POA: Diagnosis not present

## 2015-12-05 DIAGNOSIS — M150 Primary generalized (osteo)arthritis: Secondary | ICD-10-CM | POA: Diagnosis not present

## 2015-12-05 DIAGNOSIS — I129 Hypertensive chronic kidney disease with stage 1 through stage 4 chronic kidney disease, or unspecified chronic kidney disease: Secondary | ICD-10-CM | POA: Diagnosis not present

## 2015-12-05 DIAGNOSIS — T451X5D Adverse effect of antineoplastic and immunosuppressive drugs, subsequent encounter: Secondary | ICD-10-CM | POA: Diagnosis not present

## 2015-12-05 DIAGNOSIS — D6959 Other secondary thrombocytopenia: Secondary | ICD-10-CM | POA: Diagnosis not present

## 2015-12-05 DIAGNOSIS — Z7984 Long term (current) use of oral hypoglycemic drugs: Secondary | ICD-10-CM | POA: Diagnosis not present

## 2015-12-05 DIAGNOSIS — E43 Unspecified severe protein-calorie malnutrition: Secondary | ICD-10-CM | POA: Diagnosis not present

## 2015-12-05 DIAGNOSIS — Z8579 Personal history of other malignant neoplasms of lymphoid, hematopoietic and related tissues: Secondary | ICD-10-CM | POA: Diagnosis not present

## 2015-12-05 DIAGNOSIS — N183 Chronic kidney disease, stage 3 (moderate): Secondary | ICD-10-CM | POA: Diagnosis not present

## 2015-12-05 DIAGNOSIS — E119 Type 2 diabetes mellitus without complications: Secondary | ICD-10-CM | POA: Diagnosis not present

## 2015-12-05 DIAGNOSIS — Z96652 Presence of left artificial knee joint: Secondary | ICD-10-CM | POA: Diagnosis not present

## 2015-12-05 DIAGNOSIS — D649 Anemia, unspecified: Secondary | ICD-10-CM | POA: Diagnosis not present

## 2015-12-05 HISTORY — DX: Herpesviral infection, unspecified: B00.9

## 2015-12-06 LAB — CULTURE, BLOOD (ROUTINE X 2)
Culture: NO GROWTH
Culture: NO GROWTH

## 2015-12-07 ENCOUNTER — Other Ambulatory Visit: Payer: Medicare Other

## 2015-12-07 ENCOUNTER — Ambulatory Visit: Payer: Medicare Other

## 2015-12-07 ENCOUNTER — Ambulatory Visit: Payer: Medicare Other | Admitting: Hematology & Oncology

## 2015-12-13 ENCOUNTER — Emergency Department (HOSPITAL_COMMUNITY): Payer: Medicare Other

## 2015-12-13 ENCOUNTER — Encounter (HOSPITAL_COMMUNITY): Payer: Self-pay | Admitting: Emergency Medicine

## 2015-12-13 ENCOUNTER — Other Ambulatory Visit: Payer: Self-pay | Admitting: *Deleted

## 2015-12-13 ENCOUNTER — Inpatient Hospital Stay (HOSPITAL_COMMUNITY)
Admission: EM | Admit: 2015-12-13 | Discharge: 2015-12-16 | DRG: 683 | Disposition: A | Discharge status: Home / Self Care | Payer: Medicare Other | Attending: Internal Medicine | Admitting: Internal Medicine

## 2015-12-13 DIAGNOSIS — Z923 Personal history of irradiation: Secondary | ICD-10-CM

## 2015-12-13 DIAGNOSIS — I10 Essential (primary) hypertension: Secondary | ICD-10-CM | POA: Diagnosis not present

## 2015-12-13 DIAGNOSIS — R0600 Dyspnea, unspecified: Secondary | ICD-10-CM | POA: Diagnosis present

## 2015-12-13 DIAGNOSIS — Z8249 Family history of ischemic heart disease and other diseases of the circulatory system: Secondary | ICD-10-CM

## 2015-12-13 DIAGNOSIS — R531 Weakness: Secondary | ICD-10-CM | POA: Diagnosis not present

## 2015-12-13 DIAGNOSIS — D61818 Other pancytopenia: Secondary | ICD-10-CM | POA: Diagnosis present

## 2015-12-13 DIAGNOSIS — Z8261 Family history of arthritis: Secondary | ICD-10-CM

## 2015-12-13 DIAGNOSIS — Z8589 Personal history of malignant neoplasm of other organs and systems: Secondary | ICD-10-CM

## 2015-12-13 DIAGNOSIS — Z800 Family history of malignant neoplasm of digestive organs: Secondary | ICD-10-CM

## 2015-12-13 DIAGNOSIS — E860 Dehydration: Secondary | ICD-10-CM | POA: Diagnosis not present

## 2015-12-13 DIAGNOSIS — Z7984 Long term (current) use of oral hypoglycemic drugs: Secondary | ICD-10-CM

## 2015-12-13 DIAGNOSIS — Z79899 Other long term (current) drug therapy: Secondary | ICD-10-CM

## 2015-12-13 DIAGNOSIS — Z801 Family history of malignant neoplasm of trachea, bronchus and lung: Secondary | ICD-10-CM

## 2015-12-13 DIAGNOSIS — Z9481 Bone marrow transplant status: Secondary | ICD-10-CM | POA: Diagnosis not present

## 2015-12-13 DIAGNOSIS — D638 Anemia in other chronic diseases classified elsewhere: Secondary | ICD-10-CM | POA: Diagnosis present

## 2015-12-13 DIAGNOSIS — E785 Hyperlipidemia, unspecified: Secondary | ICD-10-CM | POA: Diagnosis present

## 2015-12-13 DIAGNOSIS — C50911 Malignant neoplasm of unspecified site of right female breast: Secondary | ICD-10-CM

## 2015-12-13 DIAGNOSIS — R739 Hyperglycemia, unspecified: Secondary | ICD-10-CM

## 2015-12-13 DIAGNOSIS — L8992 Pressure ulcer of unspecified site, stage 2: Secondary | ICD-10-CM | POA: Diagnosis present

## 2015-12-13 DIAGNOSIS — Z7952 Long term (current) use of systemic steroids: Secondary | ICD-10-CM

## 2015-12-13 DIAGNOSIS — N179 Acute kidney failure, unspecified: Secondary | ICD-10-CM | POA: Diagnosis not present

## 2015-12-13 DIAGNOSIS — E559 Vitamin D deficiency, unspecified: Secondary | ICD-10-CM | POA: Diagnosis present

## 2015-12-13 DIAGNOSIS — C50919 Malignant neoplasm of unspecified site of unspecified female breast: Secondary | ICD-10-CM | POA: Diagnosis present

## 2015-12-13 DIAGNOSIS — R0609 Other forms of dyspnea: Secondary | ICD-10-CM

## 2015-12-13 DIAGNOSIS — Z86010 Personal history of colonic polyps: Secondary | ICD-10-CM

## 2015-12-13 DIAGNOSIS — M1990 Unspecified osteoarthritis, unspecified site: Secondary | ICD-10-CM | POA: Diagnosis present

## 2015-12-13 DIAGNOSIS — R0602 Shortness of breath: Secondary | ICD-10-CM

## 2015-12-13 DIAGNOSIS — Z833 Family history of diabetes mellitus: Secondary | ICD-10-CM

## 2015-12-13 DIAGNOSIS — Z808 Family history of malignant neoplasm of other organs or systems: Secondary | ICD-10-CM

## 2015-12-13 DIAGNOSIS — E119 Type 2 diabetes mellitus without complications: Secondary | ICD-10-CM | POA: Diagnosis present

## 2015-12-13 DIAGNOSIS — D72829 Elevated white blood cell count, unspecified: Secondary | ICD-10-CM | POA: Diagnosis present

## 2015-12-13 DIAGNOSIS — R000 Tachycardia, unspecified: Secondary | ICD-10-CM | POA: Diagnosis present

## 2015-12-13 DIAGNOSIS — K219 Gastro-esophageal reflux disease without esophagitis: Secondary | ICD-10-CM | POA: Diagnosis present

## 2015-12-13 LAB — CBC WITH DIFFERENTIAL/PLATELET
BASOS ABS: 0 10*3/uL (ref 0.0–0.1)
BASOS PCT: 0 %
Eosinophils Absolute: 0 10*3/uL (ref 0.0–0.7)
Eosinophils Relative: 0 %
HEMATOCRIT: 24.6 % — AB (ref 36.0–46.0)
HEMOGLOBIN: 8.1 g/dL — AB (ref 12.0–15.0)
Lymphocytes Relative: 21 %
Lymphs Abs: 2.6 10*3/uL (ref 0.7–4.0)
MCH: 31.9 pg (ref 26.0–34.0)
MCHC: 32.9 g/dL (ref 30.0–36.0)
MCV: 96.9 fL (ref 78.0–100.0)
Monocytes Absolute: 1.1 10*3/uL — ABNORMAL HIGH (ref 0.1–1.0)
Monocytes Relative: 9 %
NEUTROS ABS: 8.6 10*3/uL — AB (ref 1.7–7.7)
NEUTROS PCT: 70 %
Platelets: 260 10*3/uL (ref 150–400)
RBC: 2.54 MIL/uL — ABNORMAL LOW (ref 3.87–5.11)
RDW: 19.5 % — ABNORMAL HIGH (ref 11.5–15.5)
WBC: 12.4 10*3/uL — ABNORMAL HIGH (ref 4.0–10.5)

## 2015-12-13 LAB — COMPREHENSIVE METABOLIC PANEL
ALBUMIN: 3.7 g/dL (ref 3.5–5.0)
ALK PHOS: 89 U/L (ref 38–126)
ALT: 21 U/L (ref 14–54)
AST: 18 U/L (ref 15–41)
Anion gap: 9 (ref 5–15)
BILIRUBIN TOTAL: 0.6 mg/dL (ref 0.3–1.2)
BUN: 17 mg/dL (ref 6–20)
CALCIUM: 9.2 mg/dL (ref 8.9–10.3)
CO2: 25 mmol/L (ref 22–32)
Chloride: 104 mmol/L (ref 101–111)
Creatinine, Ser: 1.63 mg/dL — ABNORMAL HIGH (ref 0.44–1.00)
GFR calc Af Amer: 36 mL/min — ABNORMAL LOW (ref >60)
GFR calc non Af Amer: 31 mL/min — ABNORMAL LOW (ref >60)
GLUCOSE: 76 mg/dL (ref 65–99)
Potassium: 3.8 mmol/L (ref 3.5–5.1)
Sodium: 138 mmol/L (ref 135–145)
TOTAL PROTEIN: 7 g/dL (ref 6.5–8.1)

## 2015-12-13 LAB — TROPONIN I: Troponin I: <0.03 ng/mL (ref <0.03)

## 2015-12-13 MED ORDER — SODIUM CHLORIDE 0.9 % IV BOLUS (SEPSIS)
1000.0000 mL | Freq: Once | INTRAVENOUS | Status: AC
Start: 2015-12-14 — End: 2015-12-14
  Administered 2015-12-14: 1000 mL via INTRAVENOUS

## 2015-12-13 MED ORDER — SODIUM CHLORIDE 0.9 % IV BOLUS (SEPSIS)
1000.0000 mL | Freq: Once | INTRAVENOUS | Status: AC
  Administered 2015-12-13: 1000 mL via INTRAVENOUS

## 2015-12-13 MED ORDER — IOPAMIDOL (ISOVUE-370) INJECTION 76%: 100.0000 mL | Freq: Once | INTRAVENOUS | Status: DC | PRN

## 2015-12-13 NOTE — ED Triage Notes (Signed)
Pt is a breast cancer pt c/o SOB for the past 2 days. Pt was admitted last week for same complaint. But is having trouble breathing again. Pt speaking in full sentences, lung sounds clear. Pt has not had any chemo treatments since last admission.

## 2015-12-13 NOTE — ED Notes (Signed)
Patient wants the nurse to access her port for labs

## 2015-12-13 NOTE — ED Notes (Signed)
Pt in Consultation room with family due to Chemo

## 2015-12-14 ENCOUNTER — Ambulatory Visit: Payer: Medicare Other

## 2015-12-14 ENCOUNTER — Observation Stay (HOSPITAL_BASED_OUTPATIENT_CLINIC_OR_DEPARTMENT_OTHER): Payer: Medicare Other

## 2015-12-14 ENCOUNTER — Encounter (HOSPITAL_COMMUNITY): Payer: Self-pay | Admitting: Radiology

## 2015-12-14 ENCOUNTER — Ambulatory Visit: Payer: Medicare Other | Admitting: Family

## 2015-12-14 ENCOUNTER — Other Ambulatory Visit: Payer: Medicare Other

## 2015-12-14 ENCOUNTER — Observation Stay (HOSPITAL_COMMUNITY): Payer: Medicare Other

## 2015-12-14 DIAGNOSIS — R000 Tachycardia, unspecified: Secondary | ICD-10-CM

## 2015-12-14 DIAGNOSIS — C50912 Malignant neoplasm of unspecified site of left female breast: Secondary | ICD-10-CM | POA: Diagnosis not present

## 2015-12-14 DIAGNOSIS — L8990 Pressure ulcer of unspecified site, unspecified stage: Secondary | ICD-10-CM | POA: Insufficient documentation

## 2015-12-14 DIAGNOSIS — Z833 Family history of diabetes mellitus: Secondary | ICD-10-CM | POA: Diagnosis not present

## 2015-12-14 DIAGNOSIS — R531 Weakness: Secondary | ICD-10-CM | POA: Diagnosis not present

## 2015-12-14 DIAGNOSIS — D61818 Other pancytopenia: Secondary | ICD-10-CM | POA: Diagnosis present

## 2015-12-14 DIAGNOSIS — E860 Dehydration: Secondary | ICD-10-CM | POA: Diagnosis not present

## 2015-12-14 DIAGNOSIS — Z9481 Bone marrow transplant status: Secondary | ICD-10-CM | POA: Diagnosis not present

## 2015-12-14 DIAGNOSIS — Z86010 Personal history of colonic polyps: Secondary | ICD-10-CM | POA: Diagnosis not present

## 2015-12-14 DIAGNOSIS — D649 Anemia, unspecified: Secondary | ICD-10-CM | POA: Diagnosis not present

## 2015-12-14 DIAGNOSIS — Z801 Family history of malignant neoplasm of trachea, bronchus and lung: Secondary | ICD-10-CM | POA: Diagnosis not present

## 2015-12-14 DIAGNOSIS — R0600 Dyspnea, unspecified: Secondary | ICD-10-CM | POA: Diagnosis not present

## 2015-12-14 DIAGNOSIS — D72829 Elevated white blood cell count, unspecified: Secondary | ICD-10-CM

## 2015-12-14 DIAGNOSIS — Z923 Personal history of irradiation: Secondary | ICD-10-CM | POA: Diagnosis not present

## 2015-12-14 DIAGNOSIS — Z808 Family history of malignant neoplasm of other organs or systems: Secondary | ICD-10-CM | POA: Diagnosis not present

## 2015-12-14 DIAGNOSIS — E559 Vitamin D deficiency, unspecified: Secondary | ICD-10-CM | POA: Diagnosis present

## 2015-12-14 DIAGNOSIS — N179 Acute kidney failure, unspecified: Secondary | ICD-10-CM | POA: Diagnosis not present

## 2015-12-14 DIAGNOSIS — Z8249 Family history of ischemic heart disease and other diseases of the circulatory system: Secondary | ICD-10-CM | POA: Diagnosis not present

## 2015-12-14 DIAGNOSIS — E119 Type 2 diabetes mellitus without complications: Secondary | ICD-10-CM | POA: Diagnosis present

## 2015-12-14 DIAGNOSIS — Z8589 Personal history of malignant neoplasm of other organs and systems: Secondary | ICD-10-CM | POA: Diagnosis not present

## 2015-12-14 DIAGNOSIS — D638 Anemia in other chronic diseases classified elsewhere: Secondary | ICD-10-CM

## 2015-12-14 DIAGNOSIS — R0602 Shortness of breath: Secondary | ICD-10-CM | POA: Diagnosis not present

## 2015-12-14 DIAGNOSIS — L8992 Pressure ulcer of unspecified site, stage 2: Secondary | ICD-10-CM | POA: Diagnosis present

## 2015-12-14 DIAGNOSIS — M1990 Unspecified osteoarthritis, unspecified site: Secondary | ICD-10-CM | POA: Diagnosis present

## 2015-12-14 DIAGNOSIS — C50911 Malignant neoplasm of unspecified site of right female breast: Secondary | ICD-10-CM | POA: Diagnosis not present

## 2015-12-14 DIAGNOSIS — K219 Gastro-esophageal reflux disease without esophagitis: Secondary | ICD-10-CM | POA: Diagnosis present

## 2015-12-14 DIAGNOSIS — Z800 Family history of malignant neoplasm of digestive organs: Secondary | ICD-10-CM | POA: Diagnosis not present

## 2015-12-14 DIAGNOSIS — E785 Hyperlipidemia, unspecified: Secondary | ICD-10-CM | POA: Diagnosis present

## 2015-12-14 DIAGNOSIS — I10 Essential (primary) hypertension: Secondary | ICD-10-CM | POA: Diagnosis present

## 2015-12-14 LAB — CBC
HEMATOCRIT: 25.3 % — AB (ref 36.0–46.0)
Hemoglobin: 8.4 g/dL — ABNORMAL LOW (ref 12.0–15.0)
MCH: 32.3 pg (ref 26.0–34.0)
MCHC: 33.2 g/dL (ref 30.0–36.0)
MCV: 97.3 fL (ref 78.0–100.0)
PLATELETS: 202 10*3/uL (ref 150–400)
RBC: 2.6 MIL/uL — ABNORMAL LOW (ref 3.87–5.11)
RDW: 20.1 % — AB (ref 11.5–15.5)
WBC: 6.8 10*3/uL (ref 4.0–10.5)

## 2015-12-14 LAB — GLUCOSE, CAPILLARY
GLUCOSE-CAPILLARY: 92 mg/dL (ref 65–99)
Glucose-Capillary: 76 mg/dL (ref 65–99)
Glucose-Capillary: 92 mg/dL (ref 65–99)
Glucose-Capillary: 87 mg/dL (ref 65–99)

## 2015-12-14 LAB — URINALYSIS, ROUTINE W REFLEX MICROSCOPIC
Bilirubin Urine: NEGATIVE
Glucose, UA: NEGATIVE mg/dL
HGB URINE DIPSTICK: NEGATIVE
Ketones, ur: NEGATIVE mg/dL
LEUKOCYTES UA: NEGATIVE
NITRITE: NEGATIVE
PROTEIN: NEGATIVE mg/dL
SPECIFIC GRAVITY, URINE: 1.02 (ref 1.005–1.030)
pH: 5.5 (ref 5.0–8.0)

## 2015-12-14 LAB — BASIC METABOLIC PANEL
Anion gap: 6 (ref 5–15)
BUN: 15 mg/dL (ref 6–20)
CALCIUM: 8.2 mg/dL — AB (ref 8.9–10.3)
CO2: 25 mmol/L (ref 22–32)
CREATININE: 1.5 mg/dL — AB (ref 0.44–1.00)
Chloride: 109 mmol/L (ref 101–111)
GFR calc Af Amer: 40 mL/min — ABNORMAL LOW (ref >60)
GFR, EST NON AFRICAN AMERICAN: 35 mL/min — AB (ref >60)
GLUCOSE: 82 mg/dL (ref 65–99)
Potassium: 3.7 mmol/L (ref 3.5–5.1)
Sodium: 140 mmol/L (ref 135–145)

## 2015-12-14 LAB — PROTIME-INR
INR: 1.07 (ref 0.00–1.49)
Prothrombin Time: 14.1 seconds (ref 11.6–15.2)

## 2015-12-14 LAB — ECHOCARDIOGRAM COMPLETE
Height: 64 in
Weight: 2720 oz

## 2015-12-14 LAB — TROPONIN I
Troponin I: 0.03 ng/mL (ref <0.03)
Troponin I: <0.03 ng/mL (ref <0.03)

## 2015-12-14 LAB — BRAIN NATRIURETIC PEPTIDE: B Natriuretic Peptide: 24.4 pg/mL (ref 0.0–100.0)

## 2015-12-14 MED ORDER — ZINC OXIDE 20 % EX OINT
1.0000 "application " | TOPICAL_OINTMENT | CUTANEOUS | Status: DC | PRN
  Filled 2015-12-14: qty 56.7

## 2015-12-14 MED ORDER — DILTIAZEM HCL ER COATED BEADS 120 MG PO CP24
240.0000 mg | ORAL_CAPSULE | Freq: Every day | ORAL | Status: DC
  Administered 2015-12-14 – 2015-12-16 (×3): 240 mg via ORAL
  Filled 2015-12-14 (×3): qty 2

## 2015-12-14 MED ORDER — IOPAMIDOL (ISOVUE-370) INJECTION 76%
100.0000 mL | Freq: Once | INTRAVENOUS | Status: AC | PRN
  Administered 2015-12-14: 63 mL via INTRAVENOUS

## 2015-12-14 MED ORDER — LIDOCAINE-PRILOCAINE 2.5-2.5 % EX CREA: 1.0000 "application " | TOPICAL_CREAM | CUTANEOUS | Status: DC | PRN

## 2015-12-14 MED ORDER — ONDANSETRON HCL 4 MG PO TABS: 4.0000 mg | ORAL_TABLET | Freq: Four times a day (QID) | ORAL | Status: DC | PRN

## 2015-12-14 MED ORDER — SODIUM CHLORIDE 0.9 % IV SOLN
INTRAVENOUS | Status: DC
  Administered 2015-12-14 – 2015-12-15 (×3): via INTRAVENOUS

## 2015-12-14 MED ORDER — BENZONATATE 100 MG PO CAPS
100.0000 mg | ORAL_CAPSULE | Freq: Three times a day (TID) | ORAL | Status: DC | PRN
  Administered 2015-12-14 – 2015-12-15 (×2): 100 mg via ORAL
  Filled 2015-12-14 (×2): qty 1

## 2015-12-14 MED ORDER — PANTOPRAZOLE SODIUM 40 MG PO TBEC
40.0000 mg | DELAYED_RELEASE_TABLET | Freq: Every day | ORAL | Status: DC
  Administered 2015-12-14 – 2015-12-16 (×3): 40 mg via ORAL
  Filled 2015-12-14 (×3): qty 1

## 2015-12-14 MED ORDER — HYDROCODONE-ACETAMINOPHEN 5-325 MG PO TABS: 1.0000 | ORAL_TABLET | ORAL | Status: DC | PRN

## 2015-12-14 MED ORDER — ONDANSETRON HCL 4 MG/2ML IJ SOLN: 4.0000 mg | Freq: Four times a day (QID) | INTRAMUSCULAR | Status: DC | PRN

## 2015-12-14 MED ORDER — ENSURE ENLIVE PO LIQD
237.0000 mL | Freq: Three times a day (TID) | ORAL | Status: DC
  Administered 2015-12-14 – 2015-12-15 (×3): 237 mL via ORAL

## 2015-12-14 MED ORDER — ACETAMINOPHEN 650 MG RE SUPP
650.0000 mg | Freq: Four times a day (QID) | RECTAL | Status: DC | PRN
Start: 2015-12-14 — End: 2015-12-16

## 2015-12-14 MED ORDER — ACETAMINOPHEN 325 MG PO TABS
650.0000 mg | ORAL_TABLET | Freq: Four times a day (QID) | ORAL | Status: DC | PRN
Start: 2015-12-14 — End: 2015-12-16

## 2015-12-14 MED ORDER — INSULIN ASPART 100 UNIT/ML ~~LOC~~ SOLN
0.0000 [IU] | Freq: Three times a day (TID) | SUBCUTANEOUS | Status: DC
  Administered 2015-12-15: 2 [IU] via SUBCUTANEOUS

## 2015-12-14 MED ORDER — HYDROCOD POLST-CPM POLST ER 10-8 MG/5ML PO SUER: 5.0000 mL | Freq: Two times a day (BID) | ORAL | Status: DC | PRN

## 2015-12-14 NOTE — ED Provider Notes (Signed)
[facility] DEPT Provider Note   CSN: [id number] Arrival date & time: 12/13/15  1821  First Provider Contact:  First MD Initiated Contact with Patient 12/13/15 1934        History   Chief Complaint Chief Complaint  Patient presents with  . Shortness of Breath    HPI [name] is a 70 y.o. female.   Shortness of Breath  This is a recurrent problem. The current episode started more than 2 days ago. The problem occurs constantly. The problem has not changed since onset.Associated symptoms include chest pain and shortness of breath. Pertinent negatives include no abdominal pain. The symptoms are aggravated by walking. Nothing relieves the symptoms. She has tried nothing for the symptoms. The treatment provided no relief.    Past Medical History:  Diagnosis Date  . Anemia   . Arthralgia    chronic, 2nd degree to stem cell transplantation  . Breast cancer, stage 1 ([place]) 09/09/2015  . Chronic pain of left knee   . Degenerative joint disease involving multiple joints 03/29/2011  . DJD (degenerative joint disease)   . GERD (gastroesophageal reflux disease)   . Herpes simplex esophagitis 2006  . History of blood transfusion    with first pregnancy  . History of multiple myeloma    chemo XRT, Dr. Marin Olp  . HTN (hypertension) 2005  . Hyperlipidemia   . Myeloma ([place]) 03/28/2011  . OA (osteoarthritis)   . Paget's bone disease   . Personal history of adenomatous colonic polyps 11/02/2011   2 diminutive serrated adenomas 10/2011 Repeat colonoscopy 2018 (change from original recall based upon current guidelines 12/07/2014)    . PONV (postoperative nausea and vomiting)   . Renal insufficiency    H/O resolved  . Vitamin D deficiency     Patient Active Problem List   Diagnosis Date Noted  . Dyspnea 12/14/2015  . 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 E. coli gastroenteritis   . Pancytopenia due to chemotherapy ([place]) 12/01/2015  . Chronic kidney disease, stage  3 12/01/2015  . 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 Dehydration 12/01/2015  . Anorexia 12/01/2015  . Loss of weight 12/01/2015  . Diarrhea 12/01/2015  . Cough 12/01/2015  . Protein-calorie malnutrition, severe 12/01/2015  . Breast cancer, stage 1 ([place]) 09/09/2015  . Encounter for CDL (commercial driving license) exam 17/49/4496  . Depression 04/11/2012  . Personal history of adenomatous colonic polyps 11/02/2011  . Degenerative joint disease involving multiple joints 03/29/2011  . Myeloma ([place]) 03/28/2011    Past Surgical History:  Procedure Laterality Date  . 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 BREAST LUMPECTOMY WITH RADIOACTIVE SEED AND SENTINEL LYMPH NODE BIOPSY Right 09/22/2015   Procedure: BREAST LUMPECTOMY WITH RADIOACTIVE SEED AND SENTINEL LYMPH NODE BIOPSY;  Surgeon: Excell Seltzer, MD;  Location: [facility];  Service: General;  Laterality: Right;  . JOINT REPLACEMENT Left   . KNEE ARTHROSCOPY  05/2008   left  . TOTAL KNEE ARTHROPLASTY  10/11/2010   left    OB History    Gravida Para Term Preterm AB Living   _0 SAB TAB Ectopic Multiple Live Births                   Home Medications    Prior to Admission medications   Medication Sig Start Date End Date Taking? Authorizing  Provider  benzonatate (TESSALON) 100 MG capsule Take 1 capsule (100 mg total) by mouth 3 (three) times daily. 12/03/15  Yes Debbe Odea, MD  chlorpheniramine-HYDROcodone (TUSSIONEX) 10-8 MG/5ML SUER Take 5 mLs by mouth every 12 (twelve) hours as needed for cough. 12/03/15  Yes Debbe Odea, MD  dexamethasone (DECADRON) 4 MG tablet Take 8 mg by mouth 2 (two) times daily. Pt is to take for four days starting the day before chemo.   Yes Historical Provider, MD  dexlansoprazole (DEXILANT) 60 MG capsule Take 60 mg by mouth daily.   Yes Historical Provider, MD  diltiazem (CARDIZEM CD) 240 MG 24 hr capsule  Take 1 capsule (240 mg total) by mouth daily. 10/27/12  Yes Junius Creamer, NP  feeding supplement, ENSURE ENLIVE, (ENSURE ENLIVE) LIQD Take 237 mLs by mouth 3 (three) times daily between meals. 12/03/15  Yes Debbe Odea, MD  hydrochlorothiazide (HYDRODIURIL) 25 MG tablet Take 25 mg by mouth daily.   Yes Historical Provider, MD  HYDROcodone-acetaminophen (NORCO/VICODIN) 5-325 MG tablet Take 1-2 tablets by mouth every 4 (four) hours as needed for moderate pain or severe pain. 09/22/15  Yes Excell Seltzer, MD  lidocaine-prilocaine (EMLA) cream Apply 1 application topically as needed (prior to accessing port).   Yes Historical Provider, MD  metFORMIN (GLUCOPHAGE) 500 MG tablet Take 500 mg by mouth 2 (two) times daily with a meal.    Yes Historical Provider, MD  ondansetron (ZOFRAN) 8 MG tablet Take 8 mg by mouth 2 (two) times daily as needed for nausea or vomiting.   Yes Historical Provider, MD  prochlorperazine (COMPAZINE) 10 MG tablet Take 10 mg by mouth every 6 (six) hours as needed for nausea or vomiting.   Yes Historical Provider, MD  Vitamin D, Ergocalciferol, (DRISDOL) 50000 units CAPS capsule Take 50,000 Units by mouth every 7 (seven) days. Pt takes on Wednesday.   Yes Historical Provider, MD  zinc oxide (BALMEX) 11.3 % CREA cream Apply 1 application topically as needed (for irritation).   Yes Historical Provider, MD  levofloxacin (LEVAQUIN) 750 MG tablet Take 1 tablet (750 mg total) by mouth daily. Patient not taking: Reported on 12/13/2015 12/03/15   Debbe Odea, MD  saccharomyces boulardii (FLORASTOR) 250 MG capsule Take 1 capsule (250 mg total) by mouth 2 (two) times daily. Patient not taking: Reported on 12/13/2015 12/03/15   Debbe Odea, MD  valACYclovir (VALTREX) 1000 MG tablet Take 1 tablet (1,000 mg total) by mouth 2 (two) times daily. Patient not taking: Reported on 12/13/2015 12/03/15   Debbe Odea, MD    Family History Family History  Problem Relation Age of Onset  . Lung cancer Father      smoker  . Throat cancer Brother   . Heart attack Mother   . Diabetes Mother   . Diabetes Sister   . Liver cancer Brother   . Pancreatic cancer Sister   . Rheum arthritis Sister   . Colon cancer Neg Hx     Social History Social History  Substance Use Topics  . Smoking status: Never Smoker  . Smokeless tobacco: Never Used     Comment: never used tobacco  . Alcohol use No     Allergies   Review of patient's allergies indicates no known allergies.   Review of Systems Review of Systems  Constitutional: Negative for fever.  Eyes: Negative for pain.  Respiratory: Positive for shortness of breath.   Cardiovascular: Positive for chest pain.  Gastrointestinal: Negative for abdominal pain.  All other systems reviewed  and are negative.    Physical Exam Updated Vital Signs BP 115/81   Pulse 116   Temp 97.8 F (36.6 C) (Oral)   Resp 23   LMP 05/22/2000   SpO2 99%   Physical Exam  Constitutional: She is oriented to person, place, and time. She appears well-developed and well-nourished.  HENT:  Head: Normocephalic and atraumatic.  Mouth/Throat: Mucous membranes are dry.  Neck: Normal range of motion.  Cardiovascular: Normal rate, regular rhythm and normal heart sounds.  Exam reveals no friction rub.   No murmur heard. Pulmonary/Chest: No stridor. No respiratory distress. She has no wheezes. She has no rales.  Abdominal: She exhibits no distension. There is no guarding.  Neurological: She is alert and oriented to person, place, and time. No cranial nerve deficit.  Skin: Skin is warm and dry.  Nursing note and vitals reviewed.    ED Treatments / Results  Labs (all labs ordered are listed, but only abnormal results are displayed) Labs Reviewed  CBC WITH DIFFERENTIAL/PLATELET - Abnormal; Notable for the following:       Result Value   WBC 12.4 (*)    RBC 2.54 (*)    Hemoglobin 8.1 (*)    HCT 24.6 (*)    RDW 19.5 (*)    Neutro Abs 8.6 (*)    Monocytes  Absolute 1.1 (*)    All other components within normal limits  COMPREHENSIVE METABOLIC PANEL - Abnormal; Notable for the following:    Creatinine, Ser 1.63 (*)    GFR calc non Af Amer 31 (*)    GFR calc Af Amer 36 (*)    All other components within normal limits  TROPONIN I  URINALYSIS, ROUTINE W REFLEX MICROSCOPIC (NOT AT [facility])    EKG  EKG Interpretation None       Radiology Dg Chest 2 View  Result Date: 12/13/2015 CLINICAL DATA:  History of breast cancer, now with shortness of breath for the past 2 days. History of hypertension. EXAM: CHEST  2 VIEW COMPARISON:  12/02/2015; 10/29/2015; 10/27/2012 FINDINGS: Grossly unchanged cardiac silhouette and mediastinal contours. Stable positioning of support apparatus. No focal parenchymal opacities. No pleural effusion or pneumothorax. No evidence of edema. No acute osseus abnormalities. Stigmata DISH within the thoracic spine. Multiple surgical clips overlie the right breast. IMPRESSION: No acute cardiopulmonary disease. Electronically Signed   By: Sandi Mariscal M.D.   On: 12/13/2015 18:48   Procedures Procedures (including critical care time)  Medications Ordered in ED Medications  iopamidol (ISOVUE-370) 76 % injection 100 mL (not administered)  sodium chloride 0.9 % bolus 1,000 mL (not administered)  sodium chloride 0.9 % bolus 1,000 mL (1,000 mLs Intravenous New Bag/Given 12/13/15 2243)     Initial Impression / Assessment and Plan / ED Course  I have reviewed the triage vital signs and the nursing notes.  Pertinent labs & imaging results that were available during my care of the patient were reviewed by me and considered in my medical decision making (see chart for details).  Clinical Course    70 year old female with cancer there were a source of breath and dyspnea on exertion, weakness progressive worsening over the last week. Also with decreased intake. Suspect most of this is from the chemotherapy however she also complains of  shortness of breath and some intermittent chest pain. Word about dehydration, infection, pulmonary embolus. Plan for labs, CT and rehydration and reevaluation. Multiple evaluations patient appears to be persistently tachycardic and tachypnea. Renal function came back elevated  cannot do CT scan this time. We'll continue fluid hydration and discussed with hospitalist about admission.   Final Clinical Impressions(s) / ED Diagnoses   Final diagnoses:  SOB (shortness of breath)  Dehydration  DOE (dyspnea on exertion)    New Prescriptions New Prescriptions   No medications on file     Merrily Pew, MD 12/14/15 [phone]

## 2015-12-14 NOTE — Progress Notes (Addendum)
PT Cancellation Note  Patient Details Name: [name] MRN: [id number] DOB: 03-Feb-1946   Cancelled Treatment:    Reason Eval/Treat Not Completed:  (to R/O PE. Order to start tomorrow. )   Claretha Cooper 12/14/2015, 1:15 PM Tresa Endo PT [phone]

## 2015-12-14 NOTE — Progress Notes (Signed)
CRITICAL VALUE ALERT  Critical value received:  Troponin 0.03  Date of notification:  12/17/2015   Time of notification:  0745  Critical value read back:Yes.    Nurse who received alert:  Aldean Baker RN  MD notified (1st page):  Irwin Brakeman, MD  Time of first page:  0745  MD notified (2nd page):  Time of second page:  Responding MD:  Irwin Brakeman, MD  Time MD responded:  [phone]

## 2015-12-14 NOTE — Progress Notes (Signed)
  Echocardiogram 2D Echocardiogram has been performed.  Donata Clay 12/14/2015, 2:10 PM

## 2015-12-14 NOTE — ED Notes (Signed)
Called floor for report asked to call back in ten mins

## 2015-12-14 NOTE — Progress Notes (Signed)
Initial Nutrition Assessment  DOCUMENTATION CODES:   Severe malnutrition in context of acute illness/injury  INTERVENTION:  - Continue Ensure Enlive TID, each supplement provides 350 kcal and 20 grams of protein - Continue to encourage PO intakes of meals and supplements.  - RD will continue to monitor for additional needs.  NUTRITION DIAGNOSIS:   Increased nutrient needs related to catabolic illness, cancer and cancer related treatments as evidenced by estimated needs.  GOAL:   Patient will meet greater than or equal to 90% of their needs  MONITOR:   PO intake, Supplement acceptance, Weight trends, Labs, I & O's  REASON FOR ASSESSMENT:   Malnutrition Screening Tool  ASSESSMENT:   70 y.o. 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  She was admitted two weeks ago for cough and shortness of breath attributed to acute bronchitis (pneumonia was ruled out with repeat chest xray), diarrhea attributed to E coli gastroenteritis (she completed one week of levaquin post discharge), pancytopenia attributed to chemotherapy, and HSV-1 infection.  The patient is accompanied by her daughter.  The patient reports that she was well for approximately 48 hours, before she developed recurrent symptoms of shortness of breath (primarily with exertion) and generalized weakness.  She has had an intermittent right sided chest pain.  She has had light-headedness and palpitations.  She denies nausea or vomiting.  No abdominal pain.  No significant swelling.  No syncope.  She admitted to her appetite and oral intake continue to be poor.  Pt seen for MST. BMI indicates overweight status. Pt reports 100% completion of a bowl of chicken noodle soup for lunch and that for breakfast this AM she ate 50% of omelet with ham and  veggies and 100% of biscuit with jelly. Pt reports that this is more than she has eaten in a day for the past 2-3 weeks due to poor appetite.   Pt was seen by RD on 12/01/15 during hospitalization. At that time pt had reported experiencing metallic taste alteration. Pt states that she continues to experience this alteration intermittently but that it does not occur when foods are salty or sweet. She currently has Ensure Enlive ordered TID and states that she was consuming this supplement intermittently at home since previous admission. She denies abdominal pain or nausea with meal intakes today or PTA.   Pt and family member at bedside had questions concerning current diet order which was answered; no further nutrition-related questions or concerns at this time. Pt reports she is beginning to feel stronger with increase in appetite and intakes since admission.  Physical assessment shows no muscle or fat wasting. Pt reports over the past 2-3 weeks she has been losing weight due to decreased intakes. Chart review indicates pt lost 17 lbs (9% body weight) in the past 3 weeks which is significant for time frame. Pt meets criteria for severe malnutrition in the setting of acute illness based on weight loss and <50% needed PO intakes for >/= 5 days PTA.  Medications reviewed; sliding scale Novolog, PRN Zofran. Labs reviewed; creatinine: 1.5 mg/dL, Ca: 8.2 mg/dL, GFR: 40 mL/min.  IVF: NS @ 75 mL/hr.    Diet Order:  Diet Carb Modified Fluid consistency: Thin; Room service appropriate? Yes  Skin:  Reviewed, no issues  Last BM:  7/24  Height:   Ht Readings from Last 1   Encounters:  12/14/15 5' 4" (1.626 m)    Weight:   Wt Readings from Last 1 Encounters:  12/14/15 170 lb (77.1 kg)    Ideal Body Weight:  54.54 kg (kg)  BMI:  Body mass index is 29.18 kg/m.  Estimated Nutritional Needs:   Kcal:  1925-2150 (25-28 kcal/kg)  Protein:  95-105 grams   Fluid:  >/= 2 L/day  EDUCATION NEEDS:    No education needs identified at this time     , MS, RD, LDN Inpatient Clinical Dietitian Pager # [phone] After hours/weekend pager # [phone]  

## 2015-12-14 NOTE — H&P (Signed)
History and Physical    [place] MHD:[id number] DOB: [birth year] DOA: 12/13/2015  PCP: [name], MD  ONCOLOGY:  Dr. Marin Olp  Patient coming from: Home  Chief Complaint: Shortness of breath, weakness, tachycardia  HPI: [name] is a 70 y.o. 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  She was admitted two weeks ago for cough and shortness of breath attributed to acute bronchitis (pneumonia was ruled out with repeat chest xray), diarrhea attributed to E coli gastroenteritis (she completed one week of levaquin post discharge), pancytopenia attributed to chemotherapy, and HSV-1 infection.  The patient is accompanied by her daughter.  The patient reports that she was well for approximately 48 hours, before she developed recurrent symptoms of shortness of breath (primarily with exertion) and generalized weakness.  She has had an intermittent right sided chest pain.  She has had light-headedness and palpitations.  She denies nausea or vomiting.  No abdominal pain.  No significant swelling.  No syncope.  She admitted to her appetite and oral intake continue to be poor.  No fevers or chills.  No dysuria.  No diarrhea.  ED Course: The patient presented with sinus tachycardia, but she was not hypoxic.  EKG negative for acute ST segment changes.  Troponin negative.  Chest xray negative for acute process.  U/A negative.  WBC count 12.4.  Hgb 8.  Platelet count normal at 260.  BUN normal but creatinine elevated to 1.63 (it was normal during her last admission).  Sinus tachycardia has resolved with IV fluids (2L NS).  The patient has had O2 sats of 99-100% on RA.  Hospitalist asked to admit for management of AKI and possible evaluation for PE with CTA if/when creatinine improves.  Review of Systems: As per HPI  otherwise 10 point review of systems negative.    Past Medical History:  Diagnosis Date  . Anemia   . Arthralgia    chronic, 2nd degree to stem cell transplantation  . Breast cancer, stage 1 ([place]) 09/09/2015  . Chronic pain of left knee   . Degenerative joint disease involving multiple joints 03/29/2011  . DJD (degenerative joint disease)   . GERD (gastroesophageal reflux disease)   . Herpes simplex esophagitis 2006  . History of blood transfusion    with first pregnancy  . History of multiple myeloma    chemo XRT, Dr. Marin Olp  . HTN (hypertension) 2005  . Hyperlipidemia   . Myeloma ([place]) 03/28/2011  . OA (osteoarthritis)   . Paget's bone disease   . Personal history of adenomatous colonic polyps 11/02/2011   2 diminutive serrated adenomas 10/2011 Repeat colonoscopy 2018 (change from original recall based upon current guidelines 12/07/2014)    . PONV (postoperative nausea and vomiting)   . Renal insufficiency    H/O resolved  . Vitamin D deficiency     Past Surgical History:  Procedure Laterality Date  . BONE MARROW TRANSPLANT  01/2005   [facility], chemo tx at [place]-2006, radiation in 2008  . BREAST LUMPECTOMY WITH RADIOACTIVE SEED AND SENTINEL LYMPH NODE BIOPSY Right 09/22/2015   Procedure: BREAST LUMPECTOMY WITH RADIOACTIVE SEED AND SENTINEL LYMPH NODE BIOPSY;  Surgeon: Excell Seltzer, MD;  Location: [facility];  Service: General;  Laterality: Right;  . JOINT REPLACEMENT Left   . KNEE ARTHROSCOPY  05/2008   left  . TOTAL  KNEE ARTHROPLASTY  10/11/2010   left     reports that she has never smoked. She has never used smokeless tobacco. She reports that she does not drink alcohol or use drugs.  She is not married.  She has three children.  Her daughter Gardner [name] is the only child that is local and is considered next of kin.  No Known Allergies  Family History  Problem Relation Age of Onset  . Lung cancer Father     smoker  . Throat cancer Brother   .  Heart attack Mother   . Diabetes Mother   . Diabetes Sister   . Liver cancer Brother   . Pancreatic cancer Sister   . Rheum arthritis Sister   . Colon cancer Neg Hx   No known family history of breast cancer or multiple myeloma.   Prior to Admission medications   Medication Sig Start Date End Date Taking? Authorizing Provider  benzonatate (TESSALON) 100 MG capsule Take 1 capsule (100 mg total) by mouth 3 (three) times daily. 12/03/15  Yes Debbe Odea, MD  chlorpheniramine-HYDROcodone (TUSSIONEX) 10-8 MG/5ML SUER Take 5 mLs by mouth every 12 (twelve) hours as needed for cough. 12/03/15  Yes Debbe Odea, MD  dexamethasone (DECADRON) 4 MG tablet Take 8 mg by mouth 2 (two) times daily. Pt is to take for four days starting the day before chemo.   Yes Historical Provider, MD  dexlansoprazole (DEXILANT) 60 MG capsule Take 60 mg by mouth daily.   Yes Historical Provider, MD  diltiazem (CARDIZEM CD) 240 MG 24 hr capsule Take 1 capsule (240 mg total) by mouth daily. 10/27/12  Yes Junius Creamer, NP  feeding supplement, ENSURE ENLIVE, (ENSURE ENLIVE) LIQD Take 237 mLs by mouth 3 (three) times daily between meals. 12/03/15  Yes Debbe Odea, MD  hydrochlorothiazide (HYDRODIURIL) 25 MG tablet Take 25 mg by mouth daily.   Yes Historical Provider, MD  HYDROcodone-acetaminophen (NORCO/VICODIN) 5-325 MG tablet Take 1-2 tablets by mouth every 4 (four) hours as needed for moderate pain or severe pain. 09/22/15  Yes Excell Seltzer, MD  lidocaine-prilocaine (EMLA) cream Apply 1 application topically as needed (prior to accessing port).   Yes Historical Provider, MD  metFORMIN (GLUCOPHAGE) 500 MG tablet Take 500 mg by mouth 2 (two) times daily with a meal.    Yes Historical Provider, MD  ondansetron (ZOFRAN) 8 MG tablet Take 8 mg by mouth 2 (two) times daily as needed for nausea or vomiting.   Yes Historical Provider, MD  prochlorperazine (COMPAZINE) 10 MG tablet Take 10 mg by mouth every 6 (six) hours as needed for  nausea or vomiting.   Yes Historical Provider, MD  Vitamin D, Ergocalciferol, (DRISDOL) 50000 units CAPS capsule Take 50,000 Units by mouth every 7 (seven) days. Pt takes on Wednesday.   Yes Historical Provider, MD  zinc oxide (BALMEX) 11.3 % CREA cream Apply 1 application topically as needed (for irritation).   Yes Historical Provider, MD    Physical Exam: Vitals:   12/14/15 0030 12/14/15 0100 12/14/15 0111 12/14/15 0130  BP: 126/81 140/73 140/73 122/67  Pulse: 106 107 103 99  Resp: (!) _0 Temp:      TempSrc:      SpO2: 100% 100% 96% 99%      Constitutional: NAD, calm, comfortable Vitals:   12/14/15 0030 12/14/15 0100 12/14/15 0111 12/14/15 0130  BP: 126/81 140/73 140/73 122/67  Pulse: 106 107 103 99  Resp: (!) _1 20  Temp:      TempSrc:      SpO2: 100% 100% 96% 99%   Eyes: PERRL, lids and conjunctivae normal ENMT: Mucous membranes are moist. Oral lesions have healed.  Posterior pharynx clear of any exudate or lesions. Normal dentition.  Neck: normal appearance, supple Respiratory: clear to auscultation bilaterally, no wheezing, no crackles. Normal respiratory effort. No accessory muscle use.  Cardiovascular: Normal rate, regular rhythm, no murmurs / rubs / gallops. No extremity edema. 2+ pedal pulses. GI: abdomen is soft and compressible.  No distention.  No tenderness.  Bowel sounds are present. Musculoskeletal:  No joint deformity in upper and lower extremities. Good ROM, no contractures. Normal muscle tone.  Skin: no rashes, warm and dry Neurologic: No focal deficits Psychiatric: Normal judgment and insight. Alert and oriented x 3. Normal mood.     Labs on Admission: I have personally reviewed following labs and imaging studies  CBC:  Recent Labs Lab 12/13/15 2220  WBC 12.4*  NEUTROABS 8.6*  HGB 8.1*  HCT 24.6*  MCV 96.9  PLT 893   Basic Metabolic Panel:  Recent Labs Lab 12/13/15 2220  NA 138  K 3.8  CL 104  CO2 25  GLUCOSE 76  BUN  17  CREATININE 1.63*  CALCIUM 9.2   GFR: Estimated Creatinine Clearance: 33.6 mL/min (by C-G formula based on SCr of 1.63 mg/dL). Liver Function Tests:  Recent Labs Lab 12/13/15 2220  AST 18  ALT 21  ALKPHOS 89  BILITOT 0.6  PROT 7.0  ALBUMIN 3.7   Cardiac Enzymes:  Recent Labs Lab 12/13/15 2220  TROPONINI <0.03   Urine analysis:    Component Value Date/Time   COLORURINE YELLOW 12/14/2015 0015   APPEARANCEUR CLEAR 12/14/2015 0015   LABSPEC 1.020 12/14/2015 0015   PHURINE 5.5 12/14/2015 0015   GLUCOSEU NEGATIVE 12/14/2015 0015   HGBUR NEGATIVE 12/14/2015 0015   BILIRUBINUR NEGATIVE 12/14/2015 0015   BILIRUBINUR NEG 05/31/2012 1047   KETONESUR NEGATIVE 12/14/2015 0015   PROTEINUR NEGATIVE 12/14/2015 0015   UROBILINOGEN 0.2 05/31/2012 1047   UROBILINOGEN 1.0 10/20/2009 1857   NITRITE NEGATIVE 12/14/2015 0015   LEUKOCYTESUR NEGATIVE 12/14/2015 0015    Radiological Exams on Admission: Dg Chest 2 View  Result Date: 12/13/2015 CLINICAL DATA:  History of breast cancer, now with shortness of breath for the past 2 days. History of hypertension. EXAM: CHEST  2 VIEW COMPARISON:  12/02/2015; 10/29/2015; 10/27/2012 FINDINGS: Grossly unchanged cardiac silhouette and mediastinal contours. Stable positioning of support apparatus. No focal parenchymal opacities. No pleural effusion or pneumothorax. No evidence of edema. No acute osseus abnormalities. Stigmata DISH within the thoracic spine. Multiple surgical clips overlie the right breast. IMPRESSION: No acute cardiopulmonary disease. Electronically Signed   By: Sandi Mariscal M.D.   On: 12/13/2015 18:48   EKG: Independently reviewed. Sinus tachycardia, no acute ST segment changes.  Assessment/Plan Principal Problem:   AKI (acute kidney injury) ([place]) Active Problems:   Breast cancer, stage 1 ([facility])   Dehydration   Dyspnea   Anemia of chronic disease   Leukocytosis   Tachycardia      AKI with dehydration --Hydrate with  NS --Repeat BMP in the AM  Dyspnea --Etiology unclear.  I believe central PE unlikely given lack of hypoxia and resolution of sinus tachycardia with hydration.  Will not give empiric lovenox at this time.  Consider CTA chest (if creatinine will allow) or V/Q scan in the AM.  Discussed with the patient and her daughter at bedside. --Taxotere can  cause cardiac toxicities.  She needs echo to verify EF. --Supplemental O2 if needed --Supportive care, no pneumonia on chest xray --Low index of suspicion for acute coronary syndrome.  Repeat EKG, BNP, troponin in the AM.  Weakness --Likely driven by chronic illness, dehydration, relative debilitated state.  She will need PT/OT.  Sinus tachycardia -Resolved with hydration, likely secondary to dehydration  Pancytopenia --Resolved  Leukocytosis --Clinical significance unclear, no sign of acute infection, may improved with hydration  Anemia of chronic disease --Stable, may even seen drop in Hgb with hydration (hgb was as low as 7 with her previous admission)  Breast cancer, active treatment --Will need to notify Dr. Marin Olp of admission in the AM (she is due to see him in clinic tomorrow) --Chemotherapy on hold for now   DVT prophylaxis: SCDs Code Status: FULL Family Communication: Daughter Erin Dy at bedside Disposition Plan: Expect she will go home when stable Consults called: NONE Admission status: Observation, med surge   TIME SPENT: 41 minutes   Eber Jones MD [facility] Pager [phone]  If 7PM-7AM, please contact night-coverage [email or web address] Password [facility]  12/14/2015, 2:47 AM

## 2015-12-14 NOTE — Progress Notes (Signed)
12/14/2015 11:45 AM  Progress Note  Pt was seen and examined.   Pt still having SOB with ambulation.  Will get CT chest PE protocol (half dose contrast).    Murvin Natal, MD

## 2015-12-15 ENCOUNTER — Encounter (HOSPITAL_COMMUNITY): Payer: Self-pay | Admitting: *Deleted

## 2015-12-15 DIAGNOSIS — N179 Acute kidney failure, unspecified: Principal | ICD-10-CM

## 2015-12-15 DIAGNOSIS — D649 Anemia, unspecified: Secondary | ICD-10-CM

## 2015-12-15 DIAGNOSIS — R000 Tachycardia, unspecified: Secondary | ICD-10-CM

## 2015-12-15 DIAGNOSIS — C50911 Malignant neoplasm of unspecified site of right female breast: Secondary | ICD-10-CM

## 2015-12-15 DIAGNOSIS — R0602 Shortness of breath: Secondary | ICD-10-CM

## 2015-12-15 DIAGNOSIS — E860 Dehydration: Secondary | ICD-10-CM

## 2015-12-15 DIAGNOSIS — R531 Weakness: Secondary | ICD-10-CM

## 2015-12-15 DIAGNOSIS — C9001 Multiple myeloma in remission: Secondary | ICD-10-CM

## 2015-12-15 LAB — GLUCOSE, CAPILLARY
GLUCOSE-CAPILLARY: 133 mg/dL — AB (ref 65–99)
Glucose-Capillary: 112 mg/dL — ABNORMAL HIGH (ref 65–99)
Glucose-Capillary: 143 mg/dL — ABNORMAL HIGH (ref 65–99)
Glucose-Capillary: 143 mg/dL — ABNORMAL HIGH (ref 65–99)

## 2015-12-15 LAB — CBC WITH DIFFERENTIAL/PLATELET
Basophils Absolute: 0 10*3/uL (ref 0.0–0.1)
Basophils Relative: 1 %
Eosinophils Absolute: 0 10*3/uL (ref 0.0–0.7)
Eosinophils Relative: 0 %
HEMATOCRIT: 22.4 % — AB (ref 36.0–46.0)
Hemoglobin: 7.2 g/dL — ABNORMAL LOW (ref 12.0–15.0)
LYMPHS PCT: 28 %
Lymphs Abs: 1.7 10*3/uL (ref 0.7–4.0)
MCH: 31.9 pg (ref 26.0–34.0)
MCHC: 32.1 g/dL (ref 30.0–36.0)
MCV: 99.1 fL (ref 78.0–100.0)
MONO ABS: 0.9 10*3/uL (ref 0.1–1.0)
MONOS PCT: 16 %
NEUTROS ABS: 3.2 10*3/uL (ref 1.7–7.7)
Neutrophils Relative %: 55 %
Platelets: 238 10*3/uL (ref 150–400)
RBC: 2.26 MIL/uL — ABNORMAL LOW (ref 3.87–5.11)
RDW: 20.8 % — AB (ref 11.5–15.5)
WBC: 5.8 10*3/uL (ref 4.0–10.5)

## 2015-12-15 LAB — COMPREHENSIVE METABOLIC PANEL
ALT: 17 U/L (ref 14–54)
ANION GAP: 6 (ref 5–15)
AST: 16 U/L (ref 15–41)
Albumin: 3 g/dL — ABNORMAL LOW (ref 3.5–5.0)
Alkaline Phosphatase: 71 U/L (ref 38–126)
BILIRUBIN TOTAL: 0.7 mg/dL (ref 0.3–1.2)
BUN: 11 mg/dL (ref 6–20)
CO2: 25 mmol/L (ref 22–32)
Calcium: 8.8 mg/dL — ABNORMAL LOW (ref 8.9–10.3)
Chloride: 111 mmol/L (ref 101–111)
Creatinine, Ser: 1.25 mg/dL — ABNORMAL HIGH (ref 0.44–1.00)
GFR, EST AFRICAN AMERICAN: 50 mL/min — AB (ref >60)
GFR, EST NON AFRICAN AMERICAN: 43 mL/min — AB (ref >60)
Glucose, Bld: 107 mg/dL — ABNORMAL HIGH (ref 65–99)
POTASSIUM: 3.9 mmol/L (ref 3.5–5.1)
Sodium: 142 mmol/L (ref 135–145)
TOTAL PROTEIN: 5.8 g/dL — AB (ref 6.5–8.1)

## 2015-12-15 LAB — HEMOGLOBIN A1C
HEMOGLOBIN A1C: 6.3 % — AB (ref 4.8–5.6)
Mean Plasma Glucose: 134 mg/dL

## 2015-12-15 LAB — PREPARE RBC (CROSSMATCH)

## 2015-12-15 LAB — ABO/RH: ABO/RH(D): AB NEG

## 2015-12-15 MED ORDER — FUROSEMIDE 10 MG/ML IJ SOLN
20.0000 mg | Freq: Once | INTRAMUSCULAR | Status: AC
  Administered 2015-12-15: 20 mg via INTRAVENOUS
  Filled 2015-12-15: qty 2

## 2015-12-15 MED ORDER — SODIUM CHLORIDE 0.9 % IV SOLN
Freq: Once | INTRAVENOUS | Status: AC
  Administered 2015-12-15: 15:00:00 via INTRAVENOUS

## 2015-12-15 NOTE — Evaluation (Signed)
Physical Therapy Evaluation Patient Details Name: [name] MRN: [id number] DOB: September 13, 1945 Today's Date: 12/15/2015   History of Present Illness  Pt admitted with SOB and weakness and hx of L TKR, Pagets Bone Disease and Breast CA  Clinical Impression  Pt admitted as above and presenting as mobilizing at MOD I - IND level of assist.  Pt to/from chair and commode unassisted and ambulating halls x 500' with no loss of balance including stepping bkwd, sideways, completing 360 turns and stork standing.  Pt agrees no PT needs at this time and will dc from PT service.  Pt hopeful for dc home with family tomorrow.    Follow Up Recommendations No PT follow up    Equipment Recommendations  None recommended by PT    Recommendations for Other Services       Precautions / Restrictions Precautions Precautions: Fall Restrictions Weight Bearing Restrictions: No      Mobility  Bed Mobility               General bed mobility comments: Pt OOB and declines return to bed  Transfers Overall transfer level: Modified independent Equipment used: None Transfers: Sit to/from Stand Sit to Stand: Modified independent (Device/Increase time)            Ambulation/Gait Ambulation/Gait assistance: Supervision;Independent Ambulation Distance (Feet): 500 Feet Assistive device: 1 person hand held assist;None Gait Pattern/deviations: Step-through pattern;Shuffle     General Gait Details: Pt ambulating in halls with no loss of balance including stepping bkwd and sideways, stork standing, and completing 360 turns.  Stairs            Wheelchair Mobility    Modified Rankin (Stroke Patients Only)       Balance Overall balance assessment: No apparent balance deficits (not formally assessed)                                           Pertinent Vitals/Pain Pain Assessment: No/denies pain    Home Living Family/patient expects to be discharged to:: Private  residence Living Arrangements: Children Available Help at Discharge: Family Type of Home: Apartment Home Access: Level entry     Home Layout: One level Home Equipment: Environmental consultant - 2 wheels;Cane - single point      Prior Function Level of Independence: Independent               Hand Dominance        Extremity/Trunk Assessment   Upper Extremity Assessment: Generalized weakness           Lower Extremity Assessment: Generalized weakness      Cervical / Trunk Assessment: Normal  Communication   Communication: No difficulties  Cognition Arousal/Alertness: Awake/alert Behavior During Therapy: WFL for tasks assessed/performed Overall Cognitive Status: Within Functional Limits for tasks assessed                      General Comments      Exercises        Assessment/Plan    PT Assessment Patient needs continued PT services  PT Diagnosis Difficulty walking   PT Problem List Decreased strength;Decreased activity tolerance  PT Treatment Interventions Gait training   PT Goals (Current goals can be found in the Care Plan section) Acute Rehab PT Goals Patient Stated Goal: Home - hopefully tomorrow. PT Goal Formulation: All assessment and education complete, DC therapy  Frequency Min 1X/week   Barriers to discharge        Co-evaluation               End of Session Equipment Utilized During Treatment: Gait belt Activity Tolerance: Patient tolerated treatment well Patient left: in chair;with call bell/phone within reach;with family/visitor present Nurse Communication: Mobility status         Time: [id number] PT Time Calculation (min) (ACUTE ONLY): 16 min   Charges:   PT Evaluation $PT Eval Low Complexity: 1 Procedure     PT G Codes:        Jecenia Leamer 29-Dec-2015, 5:26 PM

## 2015-12-15 NOTE — Progress Notes (Signed)
PT Cancellation Note  Patient Details Name: [name] MRN: [id number] DOB: November 29, 1945   Cancelled Treatment:     PT order received but eval deferred.  Pt with Hgb at 7.2 and transfusion being started at this time.  Will follow.   [name] 12/15/2015, 3:01 PM

## 2015-12-15 NOTE — Consult Note (Signed)
Referral MD  Reason for Referral: Weakness  Chief Complaint  Patient presents with  . Shortness of Breath  : I discussed very weak and tired.  HPI: [name] is well-known to me. She was recently hospitalized back in about 2 weeks ago. The point time, she came in with some shortness of breath and chest pain. This may have been from the white blood cell boozing that we haven't given her period. 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 She had 2 cycles of adjuvant Taxotere/carboplatin. Her last cycle was given on 11/24/2015.  She was recently hospitalized a couple of weeks ago. she had diarrhea. She was found have some Escherichia coli in the stool.  After she get home, she seemed to get worse. She was brought to the hospital. Cultures were all negative. She was afebrile. She was short of breath. She did have a CT angiogram that was negative.  When she was omitted, hemoglobin was 8.1. She received hydration. This made her feel better. Today however, her hemoglobin is 7.2. White cell count is fine at 5.8. Platelet count 238,000. Her left lites looked okay. Her creatinine was up a little bit that 1.63. Today, is 1.25 area and her calcium was 8.8. Albumin was 3.0.  She feels better this morning. She still feels a little bit short of breath. She still has some weakness. She is able to get out of bed and walk a little bit.  Her appetite is coming back. She's had no mouth sores. She's had no nausea or vomiting. She says that there is no diarrhea.  Overall, her performance status is ECOG 1.    Past Medical History:  Diagnosis Date  . Anemia   . Arthralgia    chronic, 2nd degree to stem cell transplantation  . Breast cancer, stage 1 ([place]) 09/09/2015  . Chronic pain of left knee   . Degenerative joint disease involving multiple joints 03/29/2011  . DJD (degenerative joint disease)   . GERD (gastroesophageal reflux disease)   . Herpes  simplex esophagitis 2006  . History of blood transfusion    with first pregnancy  . History of multiple myeloma    chemo XRT, Dr. Marin Olp  . HTN (hypertension) 2005  . Hyperlipidemia   . Myeloma ([place]) 03/28/2011  . OA (osteoarthritis)   . Paget's bone disease   . Personal history of adenomatous colonic polyps 11/02/2011   2 diminutive serrated adenomas 10/2011 Repeat colonoscopy 2018 (change from original recall based upon current guidelines 12/07/2014)    . PONV (postoperative nausea and vomiting)   . Renal insufficiency    H/O resolved  . Vitamin D deficiency   :  Past Surgical History:  Procedure Laterality Date  . BONE MARROW TRANSPLANT  01/2005   [facility], chemo tx at [place]-2006, radiation in 2008  . BREAST LUMPECTOMY WITH RADIOACTIVE SEED AND SENTINEL LYMPH NODE BIOPSY Right 09/22/2015   Procedure: BREAST LUMPECTOMY WITH RADIOACTIVE SEED AND SENTINEL LYMPH NODE BIOPSY;  Surgeon: Excell Seltzer, MD;  Location: [facility];  Service: General;  Laterality: Right;  . JOINT REPLACEMENT Left   . KNEE ARTHROSCOPY  05/2008   left  . TOTAL KNEE ARTHROPLASTY  10/11/2010   left  :   Current Facility-Administered Medications:  .  0.9 %  sodium chloride infusion, , Intravenous, Continuous, Lily Kocher, MD, Last Rate: 75 mL/hr at 12/15/15 0530 .  0.9 %  sodium chloride infusion, , Intravenous,  Once, [name], MD .  acetaminophen (TYLENOL) tablet 650 mg, 650 mg, Oral, Q6H PRN **OR** acetaminophen (TYLENOL) suppository 650 mg, 650 mg, Rectal, Q6H PRN, Lily Kocher, MD .  benzonatate (TESSALON) capsule 100 mg, 100 mg, Oral, TID PRN, Lily Kocher, MD, 100 mg at 12/14/15 6286 .  chlorpheniramine-HYDROcodone (TUSSIONEX) 10-8 MG/5ML suspension 5 mL, 5 mL, Oral, Q12H PRN, Lily Kocher, MD .  diltiazem (CARDIZEM CD) 24 hr capsule 240 mg, 240 mg, Oral, Daily, Lily Kocher, MD, 240 mg at 12/14/15 1119 .  feeding supplement (ENSURE ENLIVE) (ENSURE ENLIVE) liquid 237 mL, 237 mL,  Oral, TID BM, Lily Kocher, MD, 237 mL at 12/14/15 2058 .  furosemide (LASIX) injection 20 mg, 20 mg, Intravenous, Once, [name], MD .  HYDROcodone-acetaminophen (NORCO/VICODIN) 5-325 MG per tablet 1-2 tablet, 1-2 tablet, Oral, Q4H PRN, Lily Kocher, MD .  insulin aspart (novoLOG) injection 0-15 Units, 0-15 Units, Subcutaneous, TID WC, Lily Kocher, MD .  lidocaine-prilocaine (EMLA) cream 1 application, 1 application, Topical, PRN, Lily Kocher, MD .  ondansetron (ZOFRAN) tablet 4 mg, 4 mg, Oral, Q6H PRN **OR** ondansetron (ZOFRAN) injection 4 mg, 4 mg, Intravenous, Q6H PRN, Lily Kocher, MD .  pantoprazole (PROTONIX) EC tablet 40 mg, 40 mg, Oral, Daily, Lily Kocher, MD, 40 mg at 12/14/15 1119 .  zinc oxide 20 % ointment 1 application, 1 application, Topical, PRN, Lily Kocher, MD:  . sodium chloride   Intravenous Once  . diltiazem  240 mg Oral Daily  . feeding supplement (ENSURE ENLIVE)  237 mL Oral TID BM  . furosemide  20 mg Intravenous Once  . insulin aspart  0-15 Units Subcutaneous TID WC  . pantoprazole  40 mg Oral Daily  :  No Known Allergies:  Family History  Problem Relation Age of Onset  . Lung cancer Father     smoker  . Throat cancer Brother   . Heart attack Mother   . Diabetes Mother   . Diabetes Sister   . Liver cancer Brother   . Pancreatic cancer Sister   . Rheum arthritis Sister   . Colon cancer Neg Hx   :  Social History   Social History  . Marital status: Legally Separated    Spouse name: N/A  . Number of children: 3  . Years of education: N/A   Occupational History  . disability Retired   Social History Main Topics  . Smoking status: Never Smoker  . Smokeless tobacco: Never Used     Comment: never used tobacco  . Alcohol use No  . Drug use: No  . Sexual activity: No   Other Topics Concern  . Not on file   Social History Narrative   Workouts:   Treadmill 3x/ week   Arm weights daily         :  Pertinent items are noted in  HPI.  Exam: Patient Vitals for the past 24 hrs:  BP Temp Temp src Pulse Resp SpO2 Weight  12/15/15 0552 119/72 98.3 F (36.8 C) Oral 94 16 96 % 169 lb 4.8 oz (76.8 kg)  12/14/15 2136 123/76 98.2 F (36.8 C) Oral 94 18 98 % -  12/14/15 1334 132/69 98 F (36.7 C) Oral (!) 103 18 100 % -    Well-developed and well-nourished African-American female in no obvious distress. Head and neck exam shows no ocular or oral lesions. She has no palpable cervical or supraclavicular lymph nodes. Lungs are clear bilaterally. Cardiac exam regular rate and rhythm  with no murmurs, rubs or bruits. Abdomen is soft. She has good bowel sounds. There is no fluid wave. There is no palpable liver or spleen tip. Extremities show some trace edema in her lower legs. Neurological exam shows no focal neurological deficit. Skin exam shows no rashes, ecchymoses or petechia.    Recent Labs  12/14/15 0655 12/15/15 0600  WBC 6.8 5.8  HGB 8.4* 7.2*  HCT 25.3* 22.4*  PLT 202 238    Recent Labs  12/14/15 0655 12/15/15 0600  NA 140 142  K 3.7 3.9  CL 109 111  CO2 25 25  GLUCOSE 82 107*  BUN 15 11  CREATININE 1.50* 1.25*  CALCIUM 8.2* 8.8*    Blood smear review:  None  Pathology: None     Assessment and Plan:  [name] is a very nice 70 year old Afro-American female. 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 She underwent lumpectomy. She's been on adjuvant chemotherapy.  Of note, she has a remote history of myeloma. She had a stem cell transplantation about 13 years ago.  I really think that a blood transfusion will help her out. I talked to her about this. She understands that it will help her out. I went over that any of the side effects happen. Had noted that she's had transfusions in the past without any difficulty.  Hopefully, she gets the transfusion and she will continue to feel better and be able to be discharged tomorrow.  I probably would make sure that that her  stools checked for blood. My sure why she still would be quite anemic.  As all his, I appreciate all the great help that she is getting from everybody up on [place] 12:9-10

## 2015-12-15 NOTE — Progress Notes (Signed)
PROGRESS NOTE    [name]  MRN:[id number] DOB: 11/03/1945 DOA: 12/13/2015 PCP: RUSSO,JOHN M, MD   Outpatient Specialists: Ennover     Brief Narrative:  [name] is a 70 y.o. 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  She was admitted two weeks ago for cough and shortness of breath attributed to acute bronchitis (pneumonia was ruled out with repeat chest xray), diarrhea attributed to E coli gastroenteritis (she completed one week of levaquin post discharge), pancytopenia attributed to chemotherapy, and HSV-1 infection.  The patient is accompanied by her daughter.  The patient reports that she was well for approximately 48 hours, before she developed recurrent symptoms of shortness of breath (primarily with exertion) and generalized weakness.   Assessment & Plan:   Principal Problem:   AKI (acute kidney injury) (HCC) Active Problems:   Breast cancer, stage 1 (HCC)   Dehydration   Dyspnea   Anemia of chronic disease   Leukocytosis   Tachycardia   Pressure ulcer   AKI with dehydration --Hydrate with NS --Cr improving -will not resume HCTZ upon d/c  Dyspnea --Etiology unclear.  I believe central PE unlikely given lack of hypoxia and resolution of sinus tachycardia with hydration.  Will not give empiric lovenox at this time.   --Taxotere can cause cardiac toxicities -echo done --Supportive care, no pneumonia on chest xray --Low index of suspicion for acute coronary syndrome  Weakness --Likely driven by chronic illness, dehydration, relative debilitated state.  She will need PT/OT.  Sinus tachycardia -Resolved with hydration, likely secondary to dehydration  Pancytopenia --Resolved  Leukocytosis --Clinical significance unclear, no sign of acute infection, may improved  with hydration  Anemia of chronic disease --will transfuse 2 units PRBC  Breast cancer, active treatment --Dr. Ennever following    DVT prophylaxis:  SCD's  Code Status: Full Code   Family Communication: No family at bedside  Disposition Plan:  Home in AM??   Consultants:   Ennover  Procedures:         Subjective: SOB improved Has not gotten PRBC yet  Objective: Vitals:   12/14/15 0621 12/14/15 1334 12/14/15 2136 12/15/15 0552  BP: 126/74 132/69 123/76 119/72  Pulse: 93 (!) 103 94 94  Resp: 17 18 18 16  Temp: 98 F (36.7 C) 98 F (36.7 C) 98.2 F (36.8 C) 98.3 F (36.8 C)  TempSrc: Oral Oral Oral Oral  SpO2: 100% 100% 98% 96%  Weight:    76.8 kg (169 lb 4.8 oz)  Height:        Intake/Output Summary (Last 24 hours) at 12/15/15 1111 Last data filed at 12/15/15 0902  Gross per 24 hour  Intake             2603 ml  Output                0 ml  Net             2603 ml   Filed Weights   12/14/15 0200 12/15/15 0552  Weight: 77.1 kg (170 lb) 76.8 kg (169 lb 4.8 oz)    Examination:  General exam: Appears calm and comfortable  Respiratory system: Clear to auscultation. Respiratory effort normal. Cardiovascular system: S1 & S2 heard, RRR. No JVD, murmurs, rubs, gallops or clicks. No pedal edema. Gastrointestinal system: Abdomen is nondistended, soft and nontender.   No organomegaly or masses felt. Normal bowel sounds heard. Central nervous system: Alert and oriented. No focal neurological deficits.     Data Reviewed: I have personally reviewed following labs and imaging studies  CBC:  Recent Labs Lab 12/13/15 2220 12/14/15 0655 12/15/15 0600  WBC 12.4* 6.8 5.8  NEUTROABS 8.6*  --  3.2  HGB 8.1* 8.4* 7.2*  HCT 24.6* 25.3* 22.4*  MCV 96.9 97.3 99.1  PLT 260 202 238   Basic Metabolic Panel:  Recent Labs Lab 12/13/15 2220 12/14/15 0655 12/15/15 0600  NA 138 140 142  K 3.8 3.7 3.9  CL 104 109 111  CO2 25 25 25  GLUCOSE 76 82 107*   BUN 17 15 11  CREATININE 1.63* 1.50* 1.25*  CALCIUM 9.2 8.2* 8.8*   GFR: Estimated Creatinine Clearance: 43.2 mL/min (by C-G formula based on SCr of 1.25 mg/dL). Liver Function Tests:  Recent Labs Lab 12/13/15 2220 12/15/15 0600  AST 18 16  ALT 21 17  ALKPHOS 89 71  BILITOT 0.6 0.7  PROT 7.0 5.8*  ALBUMIN 3.7 3.0*   No results for input(s): LIPASE, AMYLASE in the last 168 hours. No results for input(s): AMMONIA in the last 168 hours. Coagulation Profile:  Recent Labs Lab 12/14/15 0655  INR 1.07   Cardiac Enzymes:  Recent Labs Lab 12/13/15 2220 12/14/15 0655 12/14/15 2230  TROPONINI <0.03 0.03* <0.03   BNP (last 3 results) No results for input(s): PROBNP in the last 8760 hours. HbA1C:  Recent Labs  12/14/15 0655  HGBA1C 6.3*   CBG:  Recent Labs Lab 12/14/15 0744 12/14/15 1146 12/14/15 1655 12/14/15 2210 12/15/15 0750  GLUCAP 92 76 92 87 112*   Lipid Profile: No results for input(s): CHOL, HDL, LDLCALC, TRIG, CHOLHDL, LDLDIRECT in the last 72 hours. Thyroid Function Tests: No results for input(s): TSH, T4TOTAL, FREET4, T3FREE, THYROIDAB in the last 72 hours. Anemia Panel: No results for input(s): VITAMINB12, FOLATE, FERRITIN, TIBC, IRON, RETICCTPCT in the last 72 hours. Urine analysis:    Component Value Date/Time   COLORURINE YELLOW 12/14/2015 0015   APPEARANCEUR CLEAR 12/14/2015 0015   LABSPEC 1.020 12/14/2015 0015   PHURINE 5.5 12/14/2015 0015   GLUCOSEU NEGATIVE 12/14/2015 0015   HGBUR NEGATIVE 12/14/2015 0015   BILIRUBINUR NEGATIVE 12/14/2015 0015   BILIRUBINUR NEG 05/31/2012 1047   KETONESUR NEGATIVE 12/14/2015 0015   PROTEINUR NEGATIVE 12/14/2015 0015   UROBILINOGEN 0.2 05/31/2012 1047   UROBILINOGEN 1.0 10/20/2009 1857   NITRITE NEGATIVE 12/14/2015 0015   LEUKOCYTESUR NEGATIVE 12/14/2015 0015   Sepsis Labs: @LABRCNTIP(procalcitonin:4,lacticidven:4)  )No results found for this or any previous visit (from the past 240 hour(s)).     Anti-infectives    None       Radiology Studies: Dg Chest 2 View  Result Date: 12/13/2015 CLINICAL DATA:  History of breast cancer, now with shortness of breath for the past 2 days. History of hypertension. EXAM: CHEST  2 VIEW COMPARISON:  12/02/2015; 10/29/2015; 10/27/2012 FINDINGS: Grossly unchanged cardiac silhouette and mediastinal contours. Stable positioning of support apparatus. No focal parenchymal opacities. No pleural effusion or pneumothorax. No evidence of edema. No acute osseus abnormalities. Stigmata DISH within the thoracic spine. Multiple surgical clips overlie the right breast. IMPRESSION: No acute cardiopulmonary disease. Electronically Signed   By: John  Watts M.D.   On: 12/13/2015 18:48  Ct Angio Chest Pe W Or Wo Contrast  Result Date: 12/14/2015 CLINICAL DATA:  Shortness of breath on exertion for 2-3 weeks. History of right breast cancer.   EXAM: CT ANGIOGRAPHY CHEST WITH CONTRAST TECHNIQUE: Multidetector CT imaging of the chest was performed using the standard protocol during bolus administration of intravenous contrast. Multiplanar CT image reconstructions and MIPs were obtained to evaluate the vascular anatomy. CONTRAST:  63 cc Isovue 370 COMPARISON:  None. FINDINGS: Cardiovascular: The heart size is normal. No pericardial effusion. No thoracic aortic aneurysm. No evidence for dissection of the thoracic aorta. No filling defects within the opacified pulmonary arteries to suggest the presence of an acute pulmonary embolus. Mediastinum/Nodes: There is no axillary lymphadenopathy. No mediastinal lymphadenopathy. There is no hilar lymphadenopathy. Left-sided Port-A-Cath tip positioned in the proximal SVC. Lungs/Pleura: No focal airspace consolidation. No pulmonary edema or pleural effusion. Dependent atelectasis noted lower lungs bilaterally. Upper Abdomen: 1.9 cm cysts noted dome of liver. Otherwise unremarkable. Musculoskeletal: Bone windows reveal no worrisome lytic or  sclerotic osseous lesions. Review of the MIP images confirms the above findings. IMPRESSION: 1. No acute findings in the thorax. Specifically, no evidence to explain the patient's history of shortness of breath on exertion. Electronically Signed   By: Eric  Mansell M.D.   On: 12/14/2015 16:12       Scheduled Meds: . sodium chloride   Intravenous Once  . diltiazem  240 mg Oral Daily  . feeding supplement (ENSURE ENLIVE)  237 mL Oral TID BM  . furosemide  20 mg Intravenous Once  . insulin aspart  0-15 Units Subcutaneous TID WC  . pantoprazole  40 mg Oral Daily   Continuous Infusions:    LOS: 1 day    Time spent: 25 min     U , DO [facility] Pager [phone]  If 7PM-7AM, please contact night-coverage [email or web address] Password [email or web address] 12/15/2015, 11:11 AM   

## 2015-12-16 ENCOUNTER — Other Ambulatory Visit: Payer: Self-pay | Admitting: Hematology & Oncology

## 2015-12-16 DIAGNOSIS — C50912 Malignant neoplasm of unspecified site of left female breast: Secondary | ICD-10-CM

## 2015-12-16 DIAGNOSIS — D638 Anemia in other chronic diseases classified elsewhere: Secondary | ICD-10-CM

## 2015-12-16 DIAGNOSIS — D72829 Elevated white blood cell count, unspecified: Secondary | ICD-10-CM

## 2015-12-16 LAB — TYPE AND SCREEN
ABO/RH(D): AB NEG
Antibody Screen: NEGATIVE
Unit division: 0
Unit division: 0

## 2015-12-16 LAB — BASIC METABOLIC PANEL
Anion gap: 6 (ref 5–15)
BUN: 11 mg/dL (ref 6–20)
CALCIUM: 8.9 mg/dL (ref 8.9–10.3)
CO2: 27 mmol/L (ref 22–32)
CREATININE: 1.16 mg/dL — AB (ref 0.44–1.00)
Chloride: 108 mmol/L (ref 101–111)
GFR, EST AFRICAN AMERICAN: 55 mL/min — AB (ref >60)
GFR, EST NON AFRICAN AMERICAN: 47 mL/min — AB (ref >60)
Glucose, Bld: 95 mg/dL (ref 65–99)
Potassium: 3.5 mmol/L (ref 3.5–5.1)
SODIUM: 141 mmol/L (ref 135–145)

## 2015-12-16 LAB — CBC
HCT: 29.5 % — ABNORMAL LOW (ref 36.0–46.0)
HEMOGLOBIN: 9.8 g/dL — AB (ref 12.0–15.0)
MCH: 31.1 pg (ref 26.0–34.0)
MCHC: 33.2 g/dL (ref 30.0–36.0)
MCV: 93.7 fL (ref 78.0–100.0)
PLATELETS: 263 10*3/uL (ref 150–400)
RBC: 3.15 MIL/uL — ABNORMAL LOW (ref 3.87–5.11)
RDW: 20.5 % — AB (ref 11.5–15.5)
WBC: 7 10*3/uL (ref 4.0–10.5)

## 2015-12-16 LAB — GLUCOSE, CAPILLARY: GLUCOSE-CAPILLARY: 93 mg/dL (ref 65–99)

## 2015-12-16 MED ORDER — BENZONATATE 100 MG PO CAPS: 100.0000 mg | ORAL_CAPSULE | Freq: Three times a day (TID) | ORAL | 0 refills | Status: DC | PRN

## 2015-12-16 MED ORDER — HEPARIN SOD (PORK) LOCK FLUSH 100 UNIT/ML IV SOLN
500.0000 [IU] | INTRAVENOUS | Status: DC | PRN
  Filled 2015-12-16: qty 5

## 2015-12-16 NOTE — Progress Notes (Signed)
[name] got her blood transfusion yesterday. She feels well. Hopefully she will go home today.  Her appetite is doing better. She has better taste for food. She's had no nausea or vomiting. There's been no diarrhea.  Her labs show her white cell count B7. Hemoglobin 9.8. Platelet count 263,000. Her creatinine is 1.16.  On her physical exam, her vital signs look pretty stable. Temperature is 90.8. Also 89. Blood pressure 137/73. Her oral exam shows no mucositis. Lungs are clear bilaterally. Cardiac exam regular rate and rhythm with no murmurs, rubs or bruits. Abdomen is soft. There is no guarding or rebound tenderness. Back exam showed no tenderness over the spine, ribs or hips. Extremities shows no clubbing, cyanosis or edema.  Again, I would think that this [name] will be going home today.  We will give her chemotherapy next week. Today she was due last week.  I appreciate the outstanding care that she received while up on [place] did a tremendous job.  Pete E.  Isaiah 35:4

## 2015-12-16 NOTE — Discharge Summary (Signed)
Physician Discharge Summary  [place] [id number] DOB: 26-Mar-1946 DOA: 12/13/2015  PCP: [name], MD  Admit date: 12/13/2015 Discharge date: 12/16/2015   Recommendations for Outpatient Follow-Up:   1. Dr. Elnoria Howard next week for chemo 2. CBC 1-2 weeks   Discharge Diagnosis:   Principal Problem:   AKI (acute kidney injury) ([facility]) Active Problems:   Breast cancer, stage 1 (HCC)   Dehydration   Dyspnea   Anemia of chronic disease   Leukocytosis   Tachycardia    Discharge disposition:  Home.  Discharge Condition: Improved.  Diet recommendation: Low sodium, heart healthy.  Carbohydrate-modified  Wound care: None.   History of Present Illness:   [name] is a 70 y.o. 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  She was admitted two weeks ago for cough and shortness of breath attributed to acute bronchitis (pneumonia was ruled out with repeat chest xray), diarrhea attributed to E coli gastroenteritis (she completed one week of levaquin post discharge), pancytopenia attributed to chemotherapy, and HSV-1 infection.  The patient is accompanied by her daughter.  The patient reports that she was well for approximately 48 hours, before she developed recurrent symptoms of shortness of breath (primarily with exertion) and generalized weakness.  She has had an intermittent right sided chest pain.  She has had light-headedness and palpitations.  She denies nausea or vomiting.  No abdominal pain.  No significant swelling.  No syncope.  She admitted to her appetite and oral intake continue to be poor.  No fevers or chills.  No dysuria.  No diarrhea.   Hospital Course by Problem:   AKI with dehydration --Hydrate with NS --Cr improving -will not resume HCTZ upon d/c  Dyspnea --Etiology  unclear. I believe central PE unlikely given lack of hypoxia and resolution of sinus tachycardia with hydration. Will not give empiric lovenox at this time.  --Taxotere can cause cardiac toxicities -echo done --Supportive care, no pneumonia on chest xray --Low index of suspicion for acute coronary syndrome  Weakness --Likely driven by chronic illness, dehydration, relative debilitated state PT saw and no follow up.  Sinus tachycardia -Resolved with hydration, likely secondary to dehydration  Pancytopenia --Resolved  Leukocytosis --Clinical significance unclear, no sign of acute infection, may improved with hydration  Anemia of chronic disease --s/p transfuse 2 units PRBC  Breast cancer, active treatment --Dr. Marin Olp following -resume chemo next week  Nursing documented: pressure ulcer- stage II with partial loss of dermis-- present on admission    Medical Consultants:    oncology   Discharge Exam:   Vitals:   12/15/15 2145 12/16/15 0510  BP: 131/68 137/73  Pulse: 94 89  Resp: 16 16  Temp: 98 F (36.7 C) 98 F (36.7 C)   Vitals:   12/15/15 1713 12/15/15 1935 12/15/15 2145 12/16/15 0510  BP: 112/73 116/73 131/68 137/73  Pulse: 88 85 94 89  Resp: _0 Temp: 98 F (36.7 C) 98.2 F (36.8 C) 98 F (36.7 C) 98 F (36.7 C)  TempSrc: Oral Oral Oral Oral  SpO2: 98% 100% 98% 100%  Weight:      Height:        Gen:  NAD    The results of significant diagnostics from this hospitalization (including imaging, microbiology, ancillary and laboratory) are listed below for reference.  Procedures and Diagnostic Studies:   Dg Chest 2 View  Result Date: 12/13/2015 CLINICAL DATA:  History of breast cancer, now with shortness of breath for the past 2 days. History of hypertension. EXAM: CHEST  2 VIEW COMPARISON:  12/02/2015; 10/29/2015; 10/27/2012 FINDINGS: Grossly unchanged cardiac silhouette and mediastinal contours. Stable positioning of  support apparatus. No focal parenchymal opacities. No pleural effusion or pneumothorax. No evidence of edema. No acute osseus abnormalities. Stigmata DISH within the thoracic spine. Multiple surgical clips overlie the right breast. IMPRESSION: No acute cardiopulmonary disease. Electronically Signed   By: Sandi Mariscal M.D.   On: 12/13/2015 18:48  Ct Angio Chest Pe W Or Wo Contrast  Result Date: 12/14/2015 CLINICAL DATA:  Shortness of breath on exertion for 2-3 weeks. History of right breast cancer. EXAM: CT ANGIOGRAPHY CHEST WITH CONTRAST TECHNIQUE: Multidetector CT imaging of the chest was performed using the standard protocol during bolus administration of intravenous contrast. Multiplanar CT image reconstructions and MIPs were obtained to evaluate the vascular anatomy. CONTRAST:  63 cc Isovue 370 COMPARISON:  None. FINDINGS: Cardiovascular: The heart size is normal. No pericardial effusion. No thoracic aortic aneurysm. No evidence for dissection of the thoracic aorta. No filling defects within the opacified pulmonary arteries to suggest the presence of an acute pulmonary embolus. Mediastinum/Nodes: There is no axillary lymphadenopathy. No mediastinal lymphadenopathy. There is no hilar lymphadenopathy. Left-sided Port-A-Cath tip positioned in the proximal SVC. Lungs/Pleura: No focal airspace consolidation. No pulmonary edema or pleural effusion. Dependent atelectasis noted lower lungs bilaterally. Upper Abdomen: 1.9 cm cysts noted dome of liver. Otherwise unremarkable. Musculoskeletal: Bone windows reveal no worrisome lytic or sclerotic osseous lesions. Review of the MIP images confirms the above findings. IMPRESSION: 1. No acute findings in the thorax. Specifically, no evidence to explain the patient's history of shortness of breath on exertion. Electronically Signed   By: Misty Stanley M.D.   On: 12/14/2015 16:12    Labs:   Basic Metabolic Panel:  Recent Labs Lab 12/13/15 2220 12/14/15 0655  12/15/15 0600 12/16/15 0540  NA 138 140 142 141  K 3.8 3.7 3.9 3.5  CL 104 109 111 108  CO2 _0 GLUCOSE 76 82 107* 95  BUN _1 CREATININE 1.63* 1.50* 1.25* 1.16*  CALCIUM 9.2 8.2* 8.8* 8.9   GFR Estimated Creatinine Clearance: 46.5 mL/min (by C-G formula based on SCr of 1.16 mg/dL). Liver Function Tests:  Recent Labs Lab 12/13/15 2220 12/15/15 0600  AST 18 16  ALT 21 17  ALKPHOS 89 71  BILITOT 0.6 0.7  PROT 7.0 5.8*  ALBUMIN 3.7 3.0*   No results for input(s): LIPASE, AMYLASE in the last 168 hours. No results for input(s): AMMONIA in the last 168 hours. Coagulation profile  Recent Labs Lab 12/14/15 0655  INR 1.07    CBC:  Recent Labs Lab 12/13/15 2220 12/14/15 0655 12/15/15 0600 12/16/15 0540  WBC 12.4* 6.8 5.8 7.0  NEUTROABS 8.6*  --  3.2  --   HGB 8.1* 8.4* 7.2* 9.8*  HCT 24.6* 25.3* 22.4* 29.5*  MCV 96.9 97.3 99.1 93.7  PLT 260 202 238 263   Cardiac Enzymes:  Recent Labs Lab 12/13/15 2220 12/14/15 0655 12/14/15 2230  TROPONINI <0.03 0.03* <0.03   BNP: Invalid input(s): POCBNP CBG:  Recent Labs Lab 12/15/15 0750 12/15/15 1158 12/15/15 1703 12/15/15 2148 12/16/15 0736  GLUCAP 112* 143* 133* 143* 93   D-Dimer No results for input(s): DDIMER in the last 72 hours.  Hgb A1c  Recent Labs  12/14/15 0655  HGBA1C 6.3*   Lipid Profile No results for input(s): CHOL, HDL, LDLCALC, TRIG, CHOLHDL, LDLDIRECT in the last 72 hours. Thyroid function studies No results for input(s): TSH, T4TOTAL, T3FREE, THYROIDAB in the last 72 hours.  Invalid input(s): FREET3 Anemia work up No results for input(s): VITAMINB12, FOLATE, FERRITIN, TIBC, IRON, RETICCTPCT in the last 72 hours. Microbiology No results found for this or any previous visit (from the past 240 hour(s)).   Discharge Instructions:   Discharge Instructions    Diet - low sodium heart healthy    Complete by:  As directed   Diet Carb Modified    Complete by:  As  directed   Discharge instructions    Complete by:  As directed   chemotherapy next week   Increase activity slowly    Complete by:  As directed       Medication List    STOP taking these medications   dexamethasone 4 MG tablet Commonly known as:  DECADRON   hydrochlorothiazide 25 MG tablet Commonly known as:  HYDRODIURIL     TAKE these medications   benzonatate 100 MG capsule Commonly known as:  TESSALON Take 1 capsule (100 mg total) by mouth 3 (three) times daily as needed for cough. What changed:  when to take this  reasons to take this   chlorpheniramine-HYDROcodone 10-8 MG/5ML Suer Commonly known as:  TUSSIONEX Take 5 mLs by mouth every 12 (twelve) hours as needed for cough.   dexlansoprazole 60 MG capsule Commonly known as:  DEXILANT Take 60 mg by mouth daily.   diltiazem 240 MG 24 hr capsule Commonly known as:  CARDIZEM CD Take 1 capsule (240 mg total) by mouth daily.   feeding supplement (ENSURE ENLIVE) Liqd Take 237 mLs by mouth 3 (three) times daily between meals.   HYDROcodone-acetaminophen 5-325 MG tablet Commonly known as:  NORCO/VICODIN Take 1-2 tablets by mouth every 4 (four) hours as needed for moderate pain or severe pain.   lidocaine-prilocaine cream Commonly known as:  EMLA Apply 1 application topically as needed (prior to accessing port).   metFORMIN 500 MG tablet Commonly known as:  GLUCOPHAGE Take 500 mg by mouth 2 (two) times daily with a meal.   ondansetron 8 MG tablet Commonly known as:  ZOFRAN Take 8 mg by mouth 2 (two) times daily as needed for nausea or vomiting.   prochlorperazine 10 MG tablet Commonly known as:  COMPAZINE Take 10 mg by mouth every 6 (six) hours as needed for nausea or vomiting.   Vitamin D (Ergocalciferol) 50000 units Caps capsule Commonly known as:  DRISDOL Take 50,000 Units by mouth every 7 (seven) days. Pt takes on Wednesday.   zinc oxide 11.3 % Crea cream Commonly known as:  BALMEX Apply 1  application topically as needed (for irritation).         Time coordinating discharge: 35 min  Signed:  Marleny Faller U Mekenzie Modeste   [facility] 12/16/2015, 8:38 AM

## 2015-12-16 NOTE — Progress Notes (Signed)
Nursing Discharge Summary  Patient ID: [name] MRN: [id number] DOB/AGE: February 03, 1946 70 y.o.  Admit date: 12/13/2015 Discharge date: 12/16/2015  Discharged Condition: good  Disposition: 01-Home or [place], MD Follow up in 1 week(s).   Specialty:  Internal Medicine Why:  cbc Contact information: [place] [address] [phone]        [name], MD .   Specialty:  Oncology Why:  next week fro chemotherapy Contact information: [place], [address] [place] [address] [phone]           Prescriptions Given: No new prescriptions. Patient medications and follow up appointments discussed. Patient verbalized understanding without further questions.  Means of Discharge: Patient to be taken downstairs via wheelchair to be discharged home via private vehicle.   Signed: Buel Ream 12/16/2015, 10:29 AM

## 2015-12-18 DIAGNOSIS — J189 Pneumonia, unspecified organism: Secondary | ICD-10-CM | POA: Diagnosis not present

## 2015-12-18 DIAGNOSIS — D6959 Other secondary thrombocytopenia: Secondary | ICD-10-CM | POA: Diagnosis not present

## 2015-12-18 DIAGNOSIS — C50911 Malignant neoplasm of unspecified site of right female breast: Secondary | ICD-10-CM | POA: Diagnosis not present

## 2015-12-18 DIAGNOSIS — E119 Type 2 diabetes mellitus without complications: Secondary | ICD-10-CM | POA: Diagnosis not present

## 2015-12-18 DIAGNOSIS — T451X5D Adverse effect of antineoplastic and immunosuppressive drugs, subsequent encounter: Secondary | ICD-10-CM | POA: Diagnosis not present

## 2015-12-18 DIAGNOSIS — D701 Agranulocytosis secondary to cancer chemotherapy: Secondary | ICD-10-CM | POA: Diagnosis not present

## 2015-12-22 ENCOUNTER — Ambulatory Visit (HOSPITAL_BASED_OUTPATIENT_CLINIC_OR_DEPARTMENT_OTHER): Payer: Medicare Other | Admitting: Hematology & Oncology

## 2015-12-22 ENCOUNTER — Encounter: Payer: Self-pay | Admitting: Hematology & Oncology

## 2015-12-22 ENCOUNTER — Other Ambulatory Visit (HOSPITAL_BASED_OUTPATIENT_CLINIC_OR_DEPARTMENT_OTHER): Payer: Medicare Other

## 2015-12-22 ENCOUNTER — Ambulatory Visit (HOSPITAL_BASED_OUTPATIENT_CLINIC_OR_DEPARTMENT_OTHER): Payer: Medicare Other

## 2015-12-22 VITALS — BP 134/73 | HR 85 | Temp 97.7°F | Resp 18 | Ht 64.0 in | Wt 170.0 lb

## 2015-12-22 DIAGNOSIS — D649 Anemia, unspecified: Secondary | ICD-10-CM | POA: Diagnosis not present

## 2015-12-22 DIAGNOSIS — Z5189 Encounter for other specified aftercare: Secondary | ICD-10-CM

## 2015-12-22 DIAGNOSIS — C50911 Malignant neoplasm of unspecified site of right female breast: Secondary | ICD-10-CM | POA: Diagnosis not present

## 2015-12-22 DIAGNOSIS — R197 Diarrhea, unspecified: Secondary | ICD-10-CM | POA: Diagnosis not present

## 2015-12-22 DIAGNOSIS — Z5111 Encounter for antineoplastic chemotherapy: Secondary | ICD-10-CM | POA: Diagnosis not present

## 2015-12-22 DIAGNOSIS — C50912 Malignant neoplasm of unspecified site of left female breast: Secondary | ICD-10-CM

## 2015-12-22 LAB — CMP (CANCER CENTER ONLY)
ALBUMIN: 3.5 g/dL (ref 3.3–5.5)
ALK PHOS: 79 U/L (ref 26–84)
ALT(SGPT): 32 U/L (ref 10–47)
AST: 25 U/L (ref 11–38)
BILIRUBIN TOTAL: 0.6 mg/dL (ref 0.20–1.60)
BUN, Bld: 13 mg/dL (ref 7–22)
CALCIUM: 9.5 mg/dL (ref 8.0–10.3)
CO2: 26 meq/L (ref 18–33)
Chloride: 107 mEq/L (ref 98–108)
Creat: 1.5 mg/dl — ABNORMAL HIGH (ref 0.6–1.2)
GLUCOSE: 75 mg/dL (ref 73–118)
POTASSIUM: 4.2 meq/L (ref 3.3–4.7)
Sodium: 141 mEq/L (ref 128–145)
Total Protein: 7 g/dL (ref 6.4–8.1)

## 2015-12-22 LAB — CBC WITH DIFFERENTIAL (CANCER CENTER ONLY)
BASO#: 0 10*3/uL (ref 0.0–0.2)
BASO%: 0.2 % (ref 0.0–2.0)
EOS ABS: 0.1 10*3/uL (ref 0.0–0.5)
EOS%: 2.4 % (ref 0.0–7.0)
HEMATOCRIT: 36.7 % (ref 34.8–46.6)
HEMOGLOBIN: 11.9 g/dL (ref 11.6–15.9)
LYMPH#: 1.5 10*3/uL (ref 0.9–3.3)
LYMPH%: 29.6 % (ref 14.0–48.0)
MCH: 32.5 pg (ref 26.0–34.0)
MCHC: 32.4 g/dL (ref 32.0–36.0)
MCV: 100 fL (ref 81–101)
MONO#: 0.8 10*3/uL (ref 0.1–0.9)
MONO%: 15.6 % — ABNORMAL HIGH (ref 0.0–13.0)
NEUT%: 52.2 % (ref 39.6–80.0)
NEUTROS ABS: 2.7 10*3/uL (ref 1.5–6.5)
Platelets: 329 10*3/uL (ref 145–400)
RBC: 3.66 10*6/uL — ABNORMAL LOW (ref 3.70–5.32)
RDW: 19.8 % — AB (ref 11.1–15.7)
WBC: 5.1 10*3/uL (ref 3.9–10.0)

## 2015-12-22 MED ORDER — PEGFILGRASTIM 6 MG/0.6ML ~~LOC~~ PSKT
6.0000 mg | PREFILLED_SYRINGE | Freq: Once | SUBCUTANEOUS | Status: AC
  Administered 2015-12-22: 6 mg via SUBCUTANEOUS

## 2015-12-22 MED ORDER — HEPARIN SOD (PORK) LOCK FLUSH 100 UNIT/ML IV SOLN
500.0000 [IU] | Freq: Once | INTRAVENOUS | Status: AC | PRN
  Administered 2015-12-22: 500 [IU]
  Filled 2015-12-22: qty 5

## 2015-12-22 MED ORDER — SODIUM CHLORIDE 0.9% FLUSH
10.0000 mL | INTRAVENOUS | Status: DC | PRN
  Administered 2015-12-22: 10 mL
  Filled 2015-12-22: qty 10

## 2015-12-22 MED ORDER — SODIUM CHLORIDE 0.9 % IV SOLN
10.0000 mg | Freq: Once | INTRAVENOUS | Status: AC
  Administered 2015-12-22: 10 mg via INTRAVENOUS
  Filled 2015-12-22: qty 1

## 2015-12-22 MED ORDER — SODIUM CHLORIDE 0.9 % IV SOLN
Freq: Once | INTRAVENOUS | Status: AC
  Administered 2015-12-22: 09:00:00 via INTRAVENOUS

## 2015-12-22 MED ORDER — PALONOSETRON HCL INJECTION 0.25 MG/5ML
INTRAVENOUS | Status: AC
  Filled 2015-12-22: qty 5

## 2015-12-22 MED ORDER — DOCETAXEL CHEMO INJECTION 160 MG/16ML
54.0000 mg/m2 | Freq: Once | INTRAVENOUS | Status: AC
  Administered 2015-12-22: 100 mg via INTRAVENOUS
  Filled 2015-12-22: qty 10

## 2015-12-22 MED ORDER — SODIUM CHLORIDE 0.9 % IV SOLN
312.4800 mg | Freq: Once | INTRAVENOUS | Status: AC
  Administered 2015-12-22: 310 mg via INTRAVENOUS
  Filled 2015-12-22: qty 31

## 2015-12-22 MED ORDER — PALONOSETRON HCL INJECTION 0.25 MG/5ML
0.2500 mg | Freq: Once | INTRAVENOUS | Status: AC
  Administered 2015-12-22: 0.25 mg via INTRAVENOUS

## 2015-12-22 MED ORDER — PEGFILGRASTIM 6 MG/0.6ML ~~LOC~~ PSKT
PREFILLED_SYRINGE | SUBCUTANEOUS | Status: AC
  Filled 2015-12-22: qty 0.6

## 2015-12-22 NOTE — Patient Instructions (Signed)
[facility] Discharge Instructions for Patients Receiving Chemotherapy  Today you received the following chemotherapy agents Taxotere, Carboplatin  To help prevent nausea and vomiting after your treatment, we encourage you to take your nausea medication   1) Zofran 8 mg.  Take 1 tablet by mouth 2 times daily as needed for nausea or vomiting.  Start on Day 3 after chemo.(Saturday June 24th).  2) Decadron.  Take 2 tablets twice daily for 4 days.  Begin day before each chemotherapy cycle.  Dont need to take on day of chemotherapy. We will give it to you IV in infusion room.   3) Compazine 10 mg.  Take 1 tablet by mouth every 6 hours as needed for nausea or vomiting.   If you develop nausea and vomiting that is not controlled by your nausea medication, call the clinic.   BELOW ARE SYMPTOMS THAT SHOULD BE REPORTED IMMEDIATELY:  *FEVER GREATER THAN 100.5 F  *CHILLS WITH OR WITHOUT FEVER  NAUSEA AND VOMITING THAT IS NOT CONTROLLED WITH YOUR NAUSEA MEDICATION  *UNUSUAL SHORTNESS OF BREATH  *UNUSUAL BRUISING OR BLEEDING  TENDERNESS IN MOUTH AND THROAT WITH OR WITHOUT PRESENCE OF ULCERS  *URINARY PROBLEMS  *BOWEL PROBLEMS  UNUSUAL RASH Items with * indicate a potential emergency and should be followed up as soon as possible.  Feel free to call the clinic you have any questions or concerns. The clinic phone number is [phone].  Please show the [facility] at check-in to the Emergency Department and triage nurse.

## 2015-12-22 NOTE — Progress Notes (Signed)
Hematology and Oncology Follow Up Visit  [facility] [id number] 12/18/1945 70 y.o. 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 Unfortunately, she is hospitalized after second cycle of treatment. She had marked fatigue and weakness. She is quite anemic. She had some diarrhea. Thankfully, there is no infection.  Currently, she is doing well. She comes in with her boyfriend. They have been busy together. He is a Arts administrator. He probably has some bottles of honey. It is really the best timing of had.  She's had no cough. There's been no mouth sores. She's had no fever. She's had no leg swelling. She's had no rashes.   Her overall performance status is ECOG 1.  Medications:  Current Outpatient Prescriptions:  .  benzonatate (TESSALON) 100 MG capsule, Take 1 capsule (100 mg total) by mouth 3 (three) times daily as needed for cough., Disp: 20 capsule, Rfl: 0 .  chlorpheniramine-HYDROcodone (TUSSIONEX) 10-8 MG/5ML SUER, Take 5 mLs by mouth every 12 (twelve) hours as needed for cough., Disp: 140 mL, Rfl: 0 .  dexlansoprazole (DEXILANT) 60 MG capsule, Take 60 mg by mouth daily., Disp: , Rfl:  .  diltiazem (CARDIZEM CD) 240 MG 24 hr capsule, Take 1 capsule (240 mg total) by mouth daily., Disp: 30 capsule, Rfl: 0 .  feeding supplement, ENSURE ENLIVE, (ENSURE ENLIVE) LIQD, Take 237 mLs by mouth 3 (three) times daily between meals., Disp: 237 mL, Rfl: 12 .  HYDROcodone-acetaminophen (NORCO/VICODIN) 5-325 MG tablet, Take 1-2 tablets by mouth every 4 (four) hours as needed for moderate pain or severe pain., Disp: 25 tablet, Rfl: 0 .  lidocaine-prilocaine (EMLA) cream, Apply 1 application topically as needed (prior to accessing port)., Disp: , Rfl:  .   metFORMIN (GLUCOPHAGE) 500 MG tablet, Take 500 mg by mouth 2 (two) times daily with a meal. , Disp: , Rfl: 1 .  ondansetron (ZOFRAN) 8 MG tablet, Take 8 mg by mouth 2 (two) times daily as needed for nausea or vomiting., Disp: , Rfl:  .  prochlorperazine (COMPAZINE) 10 MG tablet, Take 10 mg by mouth every 6 (six) hours as needed for nausea or vomiting., Disp: , Rfl:  .  Vitamin D, Ergocalciferol, (DRISDOL) 50000 units CAPS capsule, Take 50,000 Units by mouth every 7 (seven) days. Pt takes on Wednesday., Disp: , Rfl:  .  zinc oxide (BALMEX) 11.3 % CREA cream, Apply 1 application topically as needed (for irritation)., Disp: , Rfl:   Allergies: No Known Allergies  Past Medical History, Surgical history, Social history, and Family History were reviewed and updated.  Review of Systems: As above  Physical Exam:  height is 5\' 4"  (1.626 m) and weight is 170 lb (77.1 kg). Her oral temperature is 97.7 F (36.5 C). Her blood pressure is 134/73 and her pulse is 85. Her respiration is 18.   Wt Readings from Last 3 Encounters:  12/22/15 170 lb (77.1 kg)  12/15/15 169 lb 4.8 oz (76.8 kg)  11/30/15 174 lb 1.6 oz (79 kg)     Head and neck exam shows no ocular or oral lesions. There are no palpable cervical or supraclavicular lymph nodes. Lungs are clear bilaterally. Cardiac exam regular rate and rhythm with no murmurs, rubs or bruits. Breast exam shows left breast with no masses,  edema or erythema. There is no left axillary adenopathy. Right breast shows a healing lumpectomy scar at about the 12:00 position. There is no mass. There is no right axillary adenopathy. Abdomen is soft. She has good bowel sounds. There is no fluid wave or there is no palpable liver or spleen tip. Back exam shows no tenderness over the spine, ribs or hips. Extremities shows some mild swelling of the right arm. She has good range of motion of the right arm. She has good [name] in her joints. Neurological exam shows no focal  neurological deficits.  Lab Results  Component Value Date   WBC 5.1 12/22/2015   HGB 11.9 12/22/2015   HCT 36.7 12/22/2015   MCV 100 12/22/2015   PLT 329 12/22/2015     Chemistry      Component Value Date/Time   NA 141 12/16/2015 0540   NA 132 11/24/2015 1150   NA 142 10/22/2015 0950   K 3.5 12/16/2015 0540   K 4.2 11/24/2015 1150   K 4.2 10/22/2015 0950   CL 108 12/16/2015 0540   CL 103 11/24/2015 1150   CO2 27 12/16/2015 0540   CO2 23 11/24/2015 1150   CO2 24 10/22/2015 0950   BUN 11 12/16/2015 0540   BUN 47 (H) 11/24/2015 1150   BUN 14.0 10/22/2015 0950   CREATININE 1.16 (H) 12/16/2015 0540   CREATININE 1.5 (H) 11/24/2015 1150   CREATININE 1.6 (H) 10/22/2015 0950      Component Value Date/Time   CALCIUM 8.9 12/16/2015 0540   CALCIUM 8.6 11/24/2015 1150   CALCIUM 9.4 10/22/2015 0950   ALKPHOS 71 12/15/2015 0600   ALKPHOS 89 (H) 11/24/2015 1150   ALKPHOS 79 10/22/2015 0950   AST 16 12/15/2015 0600   AST 26 11/24/2015 1150   AST 35 (H) 10/22/2015 0950   ALT 17 12/15/2015 0600   ALT 47 11/24/2015 1150   ALT 41 10/22/2015 0950   BILITOT 0.7 12/15/2015 0600   BILITOT 0.50 11/24/2015 1150   BILITOT 0.30 10/22/2015 0950         Impression and Plan: [name] is A 70 year old African-American female with a stage Ia triple negative breast cancer of the right breast.  I will have to reduce the dose a little more for tolerance.    I'm just glad that she is looking better. She did get blood in the hospital and I think this really helped her out.   I will get her back in 3 weeks for her third and final cycle of treatment.   Erin Napoleon, MD 8/2/20178:50 AM

## 2015-12-23 DIAGNOSIS — E119 Type 2 diabetes mellitus without complications: Secondary | ICD-10-CM | POA: Diagnosis not present

## 2015-12-23 DIAGNOSIS — D701 Agranulocytosis secondary to cancer chemotherapy: Secondary | ICD-10-CM | POA: Diagnosis not present

## 2015-12-23 DIAGNOSIS — T451X5D Adverse effect of antineoplastic and immunosuppressive drugs, subsequent encounter: Secondary | ICD-10-CM | POA: Diagnosis not present

## 2015-12-23 DIAGNOSIS — J189 Pneumonia, unspecified organism: Secondary | ICD-10-CM | POA: Diagnosis not present

## 2015-12-23 DIAGNOSIS — D6959 Other secondary thrombocytopenia: Secondary | ICD-10-CM | POA: Diagnosis not present

## 2015-12-23 DIAGNOSIS — C50911 Malignant neoplasm of unspecified site of right female breast: Secondary | ICD-10-CM | POA: Diagnosis not present

## 2016-01-12 ENCOUNTER — Ambulatory Visit: Payer: Medicare Other | Admitting: Family

## 2016-01-12 ENCOUNTER — Ambulatory Visit: Payer: Medicare Other

## 2016-01-12 ENCOUNTER — Other Ambulatory Visit: Payer: Medicare Other

## 2016-01-13 ENCOUNTER — Ambulatory Visit (HOSPITAL_BASED_OUTPATIENT_CLINIC_OR_DEPARTMENT_OTHER): Payer: Medicare Other | Admitting: Family

## 2016-01-13 ENCOUNTER — Other Ambulatory Visit (HOSPITAL_BASED_OUTPATIENT_CLINIC_OR_DEPARTMENT_OTHER): Payer: Medicare Other

## 2016-01-13 ENCOUNTER — Encounter: Payer: Self-pay | Admitting: Family

## 2016-01-13 ENCOUNTER — Ambulatory Visit (HOSPITAL_BASED_OUTPATIENT_CLINIC_OR_DEPARTMENT_OTHER): Payer: Medicare Other

## 2016-01-13 VITALS — BP 159/74 | HR 89 | Temp 97.6°F | Resp 16 | Ht 64.0 in | Wt 175.0 lb

## 2016-01-13 DIAGNOSIS — C50912 Malignant neoplasm of unspecified site of left female breast: Secondary | ICD-10-CM

## 2016-01-13 DIAGNOSIS — Z171 Estrogen receptor negative status [ER-]: Secondary | ICD-10-CM | POA: Diagnosis not present

## 2016-01-13 DIAGNOSIS — Z5111 Encounter for antineoplastic chemotherapy: Secondary | ICD-10-CM | POA: Diagnosis not present

## 2016-01-13 DIAGNOSIS — C50911 Malignant neoplasm of unspecified site of right female breast: Secondary | ICD-10-CM

## 2016-01-13 DIAGNOSIS — K219 Gastro-esophageal reflux disease without esophagitis: Secondary | ICD-10-CM

## 2016-01-13 LAB — CMP (CANCER CENTER ONLY)
ALBUMIN: 3.5 g/dL (ref 3.3–5.5)
ALK PHOS: 83 U/L (ref 26–84)
ALT: 18 U/L (ref 10–47)
AST: 19 U/L (ref 11–38)
BUN: 13 mg/dL (ref 7–22)
CHLORIDE: 108 meq/L (ref 98–108)
CO2: 24 mEq/L (ref 18–33)
Calcium: 9.3 mg/dL (ref 8.0–10.3)
Creat: 1.3 mg/dl — ABNORMAL HIGH (ref 0.6–1.2)
Glucose, Bld: 77 mg/dL (ref 73–118)
POTASSIUM: 4 meq/L (ref 3.3–4.7)
SODIUM: 136 meq/L (ref 128–145)
TOTAL PROTEIN: 6.8 g/dL (ref 6.4–8.1)
Total Bilirubin: 0.7 mg/dl (ref 0.20–1.60)

## 2016-01-13 LAB — CBC WITH DIFFERENTIAL (CANCER CENTER ONLY)
BASO#: 0 10*3/uL (ref 0.0–0.2)
BASO%: 0.3 % (ref 0.0–2.0)
EOS ABS: 0 10*3/uL (ref 0.0–0.5)
EOS%: 0.5 % (ref 0.0–7.0)
HCT: 31.4 % — ABNORMAL LOW (ref 34.8–46.6)
HEMOGLOBIN: 10.4 g/dL — AB (ref 11.6–15.9)
LYMPH#: 1.5 10*3/uL (ref 0.9–3.3)
LYMPH%: 22.1 % (ref 14.0–48.0)
MCH: 33.4 pg (ref 26.0–34.0)
MCHC: 33.1 g/dL (ref 32.0–36.0)
MCV: 101 fL (ref 81–101)
MONO#: 0.9 10*3/uL (ref 0.1–0.9)
MONO%: 13.2 % — ABNORMAL HIGH (ref 0.0–13.0)
NEUT%: 63.9 % (ref 39.6–80.0)
NEUTROS ABS: 4.2 10*3/uL (ref 1.5–6.5)
PLATELETS: 132 10*3/uL — AB (ref 145–400)
RBC: 3.11 10*6/uL — AB (ref 3.70–5.32)
RDW: 19.3 % — ABNORMAL HIGH (ref 11.1–15.7)
WBC: 6.6 10*3/uL (ref 3.9–10.0)

## 2016-01-13 MED ORDER — PEGFILGRASTIM 6 MG/0.6ML ~~LOC~~ PSKT: 6.0000 mg | PREFILLED_SYRINGE | Freq: Once | SUBCUTANEOUS | Status: DC

## 2016-01-13 MED ORDER — SODIUM CHLORIDE 0.9 % IV SOLN
312.4800 mg | Freq: Once | INTRAVENOUS | Status: AC
  Administered 2016-01-13: 310 mg via INTRAVENOUS
  Filled 2016-01-13: qty 31

## 2016-01-13 MED ORDER — SODIUM CHLORIDE 0.9% FLUSH
10.0000 mL | INTRAVENOUS | Status: DC | PRN
  Administered 2016-01-13: 10 mL
  Filled 2016-01-13: qty 10

## 2016-01-13 MED ORDER — PEGFILGRASTIM 6 MG/0.6ML ~~LOC~~ PSKT
PREFILLED_SYRINGE | SUBCUTANEOUS | Status: AC
  Filled 2016-01-13: qty 0.6

## 2016-01-13 MED ORDER — PALONOSETRON HCL INJECTION 0.25 MG/5ML
0.2500 mg | Freq: Once | INTRAVENOUS | Status: AC
  Administered 2016-01-13: 0.25 mg via INTRAVENOUS

## 2016-01-13 MED ORDER — DEXLANSOPRAZOLE 60 MG PO CPDR: 60.0000 mg | DELAYED_RELEASE_CAPSULE | Freq: Every day | ORAL | 1 refills | Status: DC

## 2016-01-13 MED ORDER — SODIUM CHLORIDE 0.9 % IV SOLN
Freq: Once | INTRAVENOUS | Status: AC
  Administered 2016-01-13: 12:00:00 via INTRAVENOUS

## 2016-01-13 MED ORDER — DEXAMETHASONE SODIUM PHOSPHATE 100 MG/10ML IJ SOLN
10.0000 mg | Freq: Once | INTRAMUSCULAR | Status: AC
  Administered 2016-01-13: 10 mg via INTRAVENOUS
  Filled 2016-01-13: qty 1

## 2016-01-13 MED ORDER — PALONOSETRON HCL INJECTION 0.25 MG/5ML
INTRAVENOUS | Status: AC
  Filled 2016-01-13: qty 5

## 2016-01-13 MED ORDER — HEPARIN SOD (PORK) LOCK FLUSH 100 UNIT/ML IV SOLN
500.0000 [IU] | Freq: Once | INTRAVENOUS | Status: AC | PRN
  Administered 2016-01-13: 500 [IU]
  Filled 2016-01-13: qty 5

## 2016-01-13 MED ORDER — DOCETAXEL CHEMO INJECTION 160 MG/16ML
54.0000 mg/m2 | Freq: Once | INTRAVENOUS | Status: AC
  Administered 2016-01-13: 100 mg via INTRAVENOUS
  Filled 2016-01-13: qty 10

## 2016-01-13 NOTE — Progress Notes (Signed)
Hematology and Oncology Follow Up Visit  [place] [id number] 1945/10/16 70 y.o. 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 She did much better with cycle 3 being a reduced dose. She did not experience any side effects afterwards.  No fever, chills, n/v, cough, rash, dizziness, SOB, chest pain, palpitations, abdominal pain or changes in bowel or bladder habits.  No swelling, tenderness, numbness or tingling in her extremities. No new aches or c/o bone pain.  She has a good appetite but states that her taste is "off." She is staying well hydrated. Her weight is up 5 lbs since her last visit.  She stays busy and is walking in the evenings for exercise.   Medications:    Medication List       Accurate as of 01/13/16  1:01 PM. Always use your most recent med list.          benzonatate 100 MG capsule Commonly known as:  TESSALON Take 1 capsule (100 mg total) by mouth 3 (three) times daily as needed for cough.   chlorpheniramine-HYDROcodone 10-8 MG/5ML Suer Commonly known as:  TUSSIONEX Take 5 mLs by mouth every 12 (twelve) hours as needed for cough.   dexlansoprazole 60 MG capsule Commonly known as:  DEXILANT Take 60 mg by mouth daily.   diltiazem 240 MG 24 hr capsule Commonly known as:  CARDIZEM CD Take 1 capsule (240 mg total) by mouth daily.   feeding supplement (ENSURE ENLIVE) Liqd Take 237 mLs by mouth 3 (three) times daily between meals.   HYDROcodone-acetaminophen 5-325 MG tablet Commonly known as:  NORCO/VICODIN Take 1-2 tablets by mouth every 4 (four) hours as needed for moderate pain or severe pain.   lidocaine-prilocaine cream Commonly known as:  EMLA Apply 1 application topically as needed (prior  to accessing port).   metFORMIN 500 MG tablet Commonly known as:  GLUCOPHAGE Take 500 mg by mouth 2 (two) times daily with a meal.   ondansetron 8 MG tablet Commonly known as:  ZOFRAN Take 8 mg by mouth 2 (two) times daily as needed for nausea or vomiting.   prochlorperazine 10 MG tablet Commonly known as:  COMPAZINE Take 10 mg by mouth every 6 (six) hours as needed for nausea or vomiting.   Vitamin D (Ergocalciferol) 50000 units Caps capsule Commonly known as:  DRISDOL Take 50,000 Units by mouth every 7 (seven) days. Pt takes on Wednesday.   zinc oxide 11.3 % Crea cream Commonly known as:  BALMEX Apply 1 application topically as needed (for irritation).       Allergies: No Known Allergies  Past Medical History, Surgical history, Social history, and Family History were reviewed and updated.  Review of Systems: All other 10 point review of systems is negative.   Physical Exam:  height is 5\' 4"  (1.626 m) and weight is 175 lb (79.4 kg). Her oral temperature is 97.6 F (36.4 C). Her blood pressure is 159/74 (abnormal) and her pulse is 89. Her respiration is 16.   Wt Readings from Last 3 Encounters:  01/13/16 175 lb (79.4 kg)  12/22/15 170 lb (77.1 kg)  12/15/15 169 lb 4.8 oz (76.8 kg)    Ocular: Sclerae unicteric, pupils equal, round and reactive to light Ear-nose-throat: Oropharynx clear, dentition fair Lymphatic:  No cervical supraclavicular or axillary adenopathy Lungs no rales or rhonchi, good excursion bilaterally Heart regular rate and rhythm, no murmur appreciated Abd soft, nontender, positive bowel sounds, no liver or spleen tip palpated on exam MSK no focal spinal tenderness, no joint edema Neuro: non-focal, well-oriented, appropriate affect Breasts: Deferred  Lab Results  Component Value Date   WBC 6.6 01/13/2016   HGB 10.4 (L) 01/13/2016   HCT 31.4 (L) 01/13/2016   MCV 101 01/13/2016   PLT 132 (L) 01/13/2016   Lab Results  Component Value Date    FERRITIN 2,980 (H) 12/01/2015   IRON 26 (L) 12/01/2015   TIBC 143 (L) 12/01/2015   UIBC 117 12/01/2015   IRONPCTSAT 18 12/01/2015   Lab Results  Component Value Date   RETICCTPCT <0.4 (L) 12/01/2015   RBC 3.11 (L) 01/13/2016   Lab Results  Component Value Date   KPAFRELGTCHN 3.35 (H) 03/08/2015   LAMBDASER 1.98 03/08/2015   KAPLAMBRATIO 1.85 (H) 09/09/2015   Lab Results  Component Value Date   IGGSERUM 1,375 09/09/2015   IGA 433 (H) 03/08/2015   IGMSERUM 119 09/09/2015   Lab Results  Component Value Date   TOTALPROTELP 7.8 03/08/2015   ALBUMINELP 4.2 03/08/2015   A1GS 0.4 (H) 03/08/2015   A2GS 0.9 03/08/2015   BETS 0.5 03/08/2015   BETA2SER 0.5 03/08/2015   GAMS 1.4 03/08/2015   MSPIKE Not Observed 09/09/2015   SPEI * 03/08/2015     Chemistry      Component Value Date/Time   NA 136 01/13/2016 1057   NA 142 10/22/2015 0950   K 4.0 01/13/2016 1057   K 4.2 10/22/2015 0950   CL 108 01/13/2016 1057   CO2 24 01/13/2016 1057   CO2 24 10/22/2015 0950   BUN 13 01/13/2016 1057   BUN 14.0 10/22/2015 0950   CREATININE 1.3 (H) 01/13/2016 1057   CREATININE 1.6 (H) 10/22/2015 0950      Component Value Date/Time   CALCIUM 9.3 01/13/2016 1057   CALCIUM 9.4 10/22/2015 0950   ALKPHOS 83 01/13/2016 1057   ALKPHOS 79 10/22/2015 0950   AST 19 01/13/2016 1057   AST 35 (H) 10/22/2015 0950   ALT 18 01/13/2016 1057   ALT 41 10/22/2015 0950   BILITOT 0.70 01/13/2016 1057   BILITOT 0.30 10/22/2015 0950     Impression and Plan: [name] is a 70 yo African American female with a stage Ia triple negative breast cancer of the right breast. She had a hard time and ended up in the hospital after cycle 2 with anemia, fatigue and diarrhea. For cycle 3, she received a reduced dose and did not experience any adverse side effects.  Hgb is stable at 10.4 with an MCV of 101 and platelet count is 132.  We will plan to see her back in one month. At that point, if she is still doing well Dr.  Marin Olp will discuss plans for radiation.  She is in agreement with the plan and will contact our office with any questions or concerns.    Eliezer Bottom, NP 8/24/20171:01 PM

## 2016-01-13 NOTE — Patient Instructions (Signed)
[facility] Discharge Instructions for Patients Receiving Chemotherapy  Today you received the following chemotherapy agents Taxotere and Carboplatin  To help prevent nausea and vomiting after your treatment, we encourage you to take your nausea medication.   If you develop nausea and vomiting that is not controlled by your nausea medication, call the clinic.   BELOW ARE SYMPTOMS THAT SHOULD BE REPORTED IMMEDIATELY:  *FEVER GREATER THAN 100.5 F  *CHILLS WITH OR WITHOUT FEVER  NAUSEA AND VOMITING THAT IS NOT CONTROLLED WITH YOUR NAUSEA MEDICATION  *UNUSUAL SHORTNESS OF BREATH  *UNUSUAL BRUISING OR BLEEDING  TENDERNESS IN MOUTH AND THROAT WITH OR WITHOUT PRESENCE OF ULCERS  *URINARY PROBLEMS  *BOWEL PROBLEMS  UNUSUAL RASH Items with * indicate a potential emergency and should be followed up as soon as possible.  Feel free to call the clinic you have any questions or concerns. The clinic phone number is [phone].  Please show the [place] at check-in to the Emergency Department and triage nurse.

## 2016-02-17 ENCOUNTER — Encounter: Payer: Self-pay | Admitting: Hematology & Oncology

## 2016-02-17 ENCOUNTER — Other Ambulatory Visit (HOSPITAL_BASED_OUTPATIENT_CLINIC_OR_DEPARTMENT_OTHER): Payer: Medicare Other

## 2016-02-17 ENCOUNTER — Ambulatory Visit (HOSPITAL_BASED_OUTPATIENT_CLINIC_OR_DEPARTMENT_OTHER): Payer: Medicare Other | Admitting: Hematology & Oncology

## 2016-02-17 VITALS — BP 180/81 | HR 94 | Temp 97.4°F | Resp 18 | Ht 64.0 in | Wt 179.0 lb

## 2016-02-17 DIAGNOSIS — C9001 Multiple myeloma in remission: Secondary | ICD-10-CM

## 2016-02-17 DIAGNOSIS — C50911 Malignant neoplasm of unspecified site of right female breast: Secondary | ICD-10-CM | POA: Diagnosis not present

## 2016-02-17 DIAGNOSIS — K219 Gastro-esophageal reflux disease without esophagitis: Secondary | ICD-10-CM

## 2016-02-17 LAB — CBC WITH DIFFERENTIAL (CANCER CENTER ONLY)
BASO#: 0 10*3/uL (ref 0.0–0.2)
BASO%: 0.2 % (ref 0.0–2.0)
EOS%: 0.9 % (ref 0.0–7.0)
Eosinophils Absolute: 0 10*3/uL (ref 0.0–0.5)
HCT: 29.2 % — ABNORMAL LOW (ref 34.8–46.6)
HGB: 9.6 g/dL — ABNORMAL LOW (ref 11.6–15.9)
LYMPH#: 1.5 10*3/uL (ref 0.9–3.3)
LYMPH%: 35.8 % (ref 14.0–48.0)
MCH: 35.3 pg — ABNORMAL HIGH (ref 26.0–34.0)
MCHC: 32.9 g/dL (ref 32.0–36.0)
MCV: 107 fL — AB (ref 81–101)
MONO#: 0.8 10*3/uL (ref 0.1–0.9)
MONO%: 18 % — ABNORMAL HIGH (ref 0.0–13.0)
NEUT#: 1.9 10*3/uL (ref 1.5–6.5)
NEUT%: 45.1 % (ref 39.6–80.0)
PLATELETS: 245 10*3/uL (ref 145–400)
RBC: 2.72 10*6/uL — AB (ref 3.70–5.32)
RDW: 18.2 % — AB (ref 11.1–15.7)
WBC: 4.2 10*3/uL (ref 3.9–10.0)

## 2016-02-17 LAB — CMP (CANCER CENTER ONLY)
ALK PHOS: 98 U/L — AB (ref 26–84)
ALT: 26 U/L (ref 10–47)
AST: 27 U/L (ref 11–38)
Albumin: 3.8 g/dL (ref 3.3–5.5)
BILIRUBIN TOTAL: 0.6 mg/dL (ref 0.20–1.60)
BUN, Bld: 9 mg/dL (ref 7–22)
CO2: 25 mEq/L (ref 18–33)
CREATININE: 1.4 mg/dL — AB (ref 0.6–1.2)
Calcium: 9.5 mg/dL (ref 8.0–10.3)
Chloride: 107 mEq/L (ref 98–108)
Glucose, Bld: 88 mg/dL (ref 73–118)
POTASSIUM: 4 meq/L (ref 3.3–4.7)
Sodium: 138 mEq/L (ref 128–145)
TOTAL PROTEIN: 7 g/dL (ref 6.4–8.1)

## 2016-02-17 NOTE — Progress Notes (Signed)
Hematology and Oncology Follow Up Visit  [facility] [id number] Jan 20, 1946 70 y.o. 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 She looks quite good. She'll treated her chemotherapy about a month ago. She is now back to work. 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 She really enjoys this.  She is feeling more energetic. She has more stamina.  She's had no cough or shortness of breath. She's had no obvious bleeding. There's been no change in bowel or bladder habits.  She's had no nausea or vomiting. She is eating well. She will be going to a birthday party this weekend.   Her overall performance status is ECOG 1.  Medications:  Current Outpatient Prescriptions:  .  benzonatate (TESSALON) 100 MG capsule, Take 1 capsule (100 mg total) by mouth 3 (three) times daily as needed for cough., Disp: 20 capsule, Rfl: 0 .  chlorpheniramine-HYDROcodone (TUSSIONEX) 10-8 MG/5ML SUER, Take 5 mLs by mouth every 12 (twelve) hours as needed for cough., Disp: 140 mL, Rfl: 0 .  dexlansoprazole (DEXILANT) 60 MG capsule, Take 1 capsule (60 mg total) by mouth daily., Disp: 30 capsule, Rfl: 1 .  diltiazem (CARDIZEM CD) 240 MG 24 hr capsule, Take 1 capsule (240 mg total) by mouth daily., Disp: 30 capsule, Rfl: 0 .  feeding supplement, ENSURE ENLIVE, (ENSURE ENLIVE) LIQD, Take 237 mLs by mouth 3 (three) times daily between meals., Disp: 237 mL, Rfl: 12 .  HYDROcodone-acetaminophen (NORCO/VICODIN) 5-325 MG tablet, Take 1-2 tablets by mouth every 4 (four) hours as needed for moderate pain or severe pain., Disp: 25 tablet, Rfl: 0 .  lidocaine-prilocaine  (EMLA) cream, Apply 1 application topically as needed (prior to accessing port)., Disp: , Rfl:  .  metFORMIN (GLUCOPHAGE) 500 MG tablet, Take 500 mg by mouth 2 (two) times daily with a meal. , Disp: , Rfl: 1 .  ondansetron (ZOFRAN) 8 MG tablet, Take 8 mg by mouth 2 (two) times daily as needed for nausea or vomiting., Disp: , Rfl:  .  prochlorperazine (COMPAZINE) 10 MG tablet, Take 10 mg by mouth every 6 (six) hours as needed for nausea or vomiting., Disp: , Rfl:  .  Vitamin D, Ergocalciferol, (DRISDOL) 50000 units CAPS capsule, Take 50,000 Units by mouth every 7 (seven) days. Pt takes on Wednesday., Disp: , Rfl:  .  zinc oxide (BALMEX) 11.3 % CREA cream, Apply 1 application topically as needed (for irritation)., Disp: , Rfl:   Allergies: No Known Allergies  Past Medical History, Surgical history, Social history, and Family History were reviewed and updated.  Review of Systems: As above  Physical Exam:  height is 5\' 4"  (1.626 m) and weight is 179 lb (81.2 kg). Her oral temperature is 97.4 F (36.3 C). Her blood pressure is 180/81 (abnormal) and her pulse is 94. Her respiration is 18.   Wt Readings from Last 3 Encounters:  02/17/16 179 lb (81.2 kg)  01/13/16 175 lb (79.4 kg)  12/22/15 170 lb (77.1 kg)     Head and neck exam shows no ocular or oral lesions. There are no palpable cervical or supraclavicular lymph nodes. Lungs  are clear bilaterally. Cardiac exam regular rate and rhythm with no murmurs, rubs or bruits. Breast exam shows left breast with no masses, edema or erythema. There is no left axillary adenopathy. Right breast shows a healing lumpectomy scar at about the 12:00 position. There is no mass. There is no right axillary adenopathy. Abdomen is soft. She has good bowel sounds. There is no fluid wave or there is no palpable liver or spleen tip. Back exam shows no tenderness over the spine, ribs or hips. Extremities shows some mild swelling of the right arm. She has good range of  motion of the right arm. She has good strength in her joints. Neurological exam shows no focal neurological deficits.  Lab Results  Component Value Date   WBC 4.2 02/17/2016   HGB 9.6 (L) 02/17/2016   HCT 29.2 (L) 02/17/2016   MCV 107 (H) 02/17/2016   PLT 245 02/17/2016     Chemistry      Component Value Date/Time   NA 138 02/17/2016 1006   NA 142 10/22/2015 0950   K 4.0 02/17/2016 1006   K 4.2 10/22/2015 0950   CL 107 02/17/2016 1006   CO2 25 02/17/2016 1006   CO2 24 10/22/2015 0950   BUN 9 02/17/2016 1006   BUN 14.0 10/22/2015 0950   CREATININE 1.4 (H) 02/17/2016 1006   CREATININE 1.6 (H) 10/22/2015 0950      Component Value Date/Time   CALCIUM 9.5 02/17/2016 1006   CALCIUM 9.4 10/22/2015 0950   ALKPHOS 98 (H) 02/17/2016 1006   ALKPHOS 79 10/22/2015 0950   AST 27 02/17/2016 1006   AST 35 (H) 10/22/2015 0950   ALT 26 02/17/2016 1006   ALT 41 10/22/2015 0950   BILITOT 0.60 02/17/2016 1006   BILITOT 0.30 10/22/2015 0950         Impression and Plan: [name] is A 70 year old African-American female with a stage Ia triple negative breast cancer of the right breast.She's completed her chemotherapy. She did well with this.  She will now will need radiation therapy. She had a lumpectomy. I think radiation would be appropriate. I will speak with Dr. Sondra Come about this.  She is triple negative so there is no role for aromatase inhibitor therapy.  I saw to remember that she does have the myeloma. This is in remission. The Zometa, every 6 months I think will help with the myeloma but also will help with the breast cancer and decreasing the risk of recurrence.  We see her back in 2 months, we will do her Zometa then.. .   [name], MD 9/28/201711:45 AM

## 2016-02-18 ENCOUNTER — Encounter: Payer: Self-pay | Admitting: Radiation Oncology

## 2016-02-18 NOTE — Progress Notes (Signed)
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 Breast, lumpectomy, Right Posterior - INVASIVE DUCTAL CARCINOMA, GRADE II/III, SPANNING 0.3 CM. - THE SURGICAL RESECTION MARGINS ARE NEGATIVE FOR CARCINOMA. - SEE ONCOLOGY TABLE BELOW. 2. Breast, excision, Right Anterior - BENIGN BREAST PARENCHYMA. - THERE IS NO EVIDENCE OF MALIGNANCY. - SEE COMMENT. 3. Lymph node, sentinel, biopsy, Right Axillary - THERE IS NO EVIDENCE OF CARCINOMA IN 1 OF 1 LYMPH NODE (0/1).  08/20/15 Diagnosis Breast, right, needle core biopsy, upper inner quadrant - INVASIVE DUCTAL CARCINOMA WITH ASSOCIATED LYMPHOID MATERIAL. - DUCTAL CARCINOMA IN SITU. - SEE COMMENT.  Receptor Status: ER(0%), PR (0%), Her2-neu (negative)  Did patient present with symptoms (if so, please note symptoms) or was this found on screening mammography?: screening mammogram  Past/Anticipated interventions by surgeon, if any: 09/22/15 - BREAST LUMPECTOMY WITH RADIOACTIVE SEED AND SENTINEL LYMPH NODE BIOPSY;  Surgeon: Excell Seltzer, MD   Past/Anticipated interventions by medical oncology, if any: Status post cycle #4 of Taxotere/carboplatinum - completed    01/13/2016, Neulasta 6 mg subcutaneous post chemotherapy and Zometa 4 mg IV q 6 months.  Lymphedema issues, if any: no Pain issues, if any:  no   SAFETY ISSUES:  Prior radiation? Yes - 09/28/2006 - 10/11/2006 skull 1980 cGy by Dr. Elba Barman  Pacemaker/ICD? no  Possible current pregnancy?no  Is the patient on methotrexate? no  Current Complaints / other details:  Patient has a history of multiple myeloma, s/p bone marrow transplant, chemo and radiation.  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  BP (!) 189/96 (BP Location: Right Arm, Patient Position: Sitting)   Pulse 98   Temp 98.2 F (36.8 C) (Oral)   Ht _0  (1.626 m)   Wt  181 lb 9.6 oz (82.4 kg)   LMP 05/22/2000   SpO2 100%   BMI 31.17 kg/m    Wt Readings from Last 3 Encounters:  02/23/16 181 lb 9.6 oz (82.4 kg)  02/17/16 179 lb (81.2 kg)  01/13/16 175 lb (79.4 kg)      Jacqulyn Liner, RN 02/18/2016,9:34 AM

## 2016-02-21 DIAGNOSIS — E668 Other obesity: Secondary | ICD-10-CM | POA: Diagnosis not present

## 2016-02-21 DIAGNOSIS — E1122 Type 2 diabetes mellitus with diabetic chronic kidney disease: Secondary | ICD-10-CM | POA: Diagnosis not present

## 2016-02-21 DIAGNOSIS — C50011 Malignant neoplasm of nipple and areola, right female breast: Secondary | ICD-10-CM | POA: Diagnosis not present

## 2016-02-21 DIAGNOSIS — D6489 Other specified anemias: Secondary | ICD-10-CM | POA: Diagnosis not present

## 2016-02-21 DIAGNOSIS — I129 Hypertensive chronic kidney disease with stage 1 through stage 4 chronic kidney disease, or unspecified chronic kidney disease: Secondary | ICD-10-CM | POA: Diagnosis not present

## 2016-02-21 DIAGNOSIS — N183 Chronic kidney disease, stage 3 (moderate): Secondary | ICD-10-CM | POA: Diagnosis not present

## 2016-02-21 DIAGNOSIS — Z6832 Body mass index (BMI) 32.0-32.9, adult: Secondary | ICD-10-CM | POA: Diagnosis not present

## 2016-02-21 DIAGNOSIS — E784 Other hyperlipidemia: Secondary | ICD-10-CM | POA: Diagnosis not present

## 2016-02-21 DIAGNOSIS — Z23 Encounter for immunization: Secondary | ICD-10-CM | POA: Diagnosis not present

## 2016-02-22 ENCOUNTER — Ambulatory Visit: Payer: Medicare Other

## 2016-02-22 ENCOUNTER — Ambulatory Visit: Payer: Medicare Other | Admitting: Radiation Oncology

## 2016-02-23 ENCOUNTER — Encounter: Payer: Self-pay | Admitting: Radiation Oncology

## 2016-02-23 ENCOUNTER — Ambulatory Visit
Admission: RE | Admit: 2016-02-23 | Discharge: 2016-02-23 | Disposition: A | Discharge status: Home / Self Care | Payer: Medicare Other | Source: Ambulatory Visit | Attending: Radiation Oncology | Admitting: Radiation Oncology

## 2016-02-23 DIAGNOSIS — Z9221 Personal history of antineoplastic chemotherapy: Secondary | ICD-10-CM | POA: Diagnosis not present

## 2016-02-23 DIAGNOSIS — Z171 Estrogen receptor negative status [ER-]: Secondary | ICD-10-CM | POA: Diagnosis not present

## 2016-02-23 DIAGNOSIS — Z510 Encounter for antineoplastic radiation therapy: Secondary | ICD-10-CM | POA: Insufficient documentation

## 2016-02-23 DIAGNOSIS — C50911 Malignant neoplasm of unspecified site of right female breast: Secondary | ICD-10-CM

## 2016-02-23 HISTORY — DX: Reserved for inherently not codable concepts without codable children: IMO0001

## 2016-02-23 HISTORY — DX: Reserved for concepts with insufficient information to code with codable children: IMO0002

## 2016-02-23 NOTE — Progress Notes (Signed)
Radiation Oncology         (336) [phone] ________________________________  Name: [name] MRN: [id number]  Date: 02/23/2016  DOB: [birth year]  Re-evaluation Note  CC: [name], MD  [name], MD    ICD-9-CM ICD-10-CM   1. 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 Elba Barman     Narrative:  The patient returns today for Further evaluation.  [name] is a 70 y.o. female who presented with an abnormal screening mammogram. She underwent a right breast core biopsy of the upper inner quadrant on 08/20/15. 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 Receptor status Er (0%), PR (0%), Her2 (-). [name] then underwent a right posterior breast lumpectomy with radioactive seed and sentinel lymph node biopsy performed by Dr. Excell Seltzer on 09/22/15. This revealed invasive ductile carcinoma, grade II/III, spanning 0.3 cm. The surgical resection margins are negative. Right anterior breast excision revealed benign breast parnchyma, and no evidence of malignancy. Right axillary sentinel lymph node biopsy revealed no evidence of carcinoma in 1 of 1 lymph nodes (0/1). The patient has now completed chemotherapy. Patient is status post cycle #4 of Taxotere/carboplatinum completed on 01/13/16, Neulasta 6 mg subcutaneous post chemotherapy and Zometa 4 mg IV q 6 months. Patient denies lymphedema issues or pain at this time.   Of note, patient has a history of multiple myeloma, s/p bone marrow transplant, chemo and radiation.                               ALLERGIES:  has No Known Allergies.  Meds: Current Outpatient Prescriptions  Medication Sig Dispense Refill  . dexlansoprazole (DEXILANT) 60  MG capsule Take 1 capsule (60 mg total) by mouth daily. 30 capsule 1  . feeding supplement, ENSURE ENLIVE, (ENSURE ENLIVE) LIQD Take 237 mLs by mouth 3 (three) times daily between meals. 237 mL 12  . lidocaine-prilocaine (EMLA) cream Apply 1 application topically as needed (prior to accessing port).    . metFORMIN (GLUCOPHAGE) 500 MG tablet Take 500 mg by mouth 2 (two) times daily with a meal.   1  . Vitamin D, Ergocalciferol, (DRISDOL) 50000 units CAPS capsule Take 50,000 Units by mouth every 7 (seven) days. Pt takes on Wednesday.    . benzonatate (TESSALON) 100 MG capsule Take 1 capsule (100 mg total) by mouth 3 (three) times daily as needed for cough. (Patient not taking: Reported on 02/23/2016) 20 capsule 0  . chlorpheniramine-HYDROcodone (TUSSIONEX) 10-8 MG/5ML SUER Take 5 mLs by mouth every 12 (twelve) hours as needed for cough. (Patient not taking: Reported on 02/23/2016) 140 mL 0  . diltiazem (CARDIZEM CD) 240 MG 24 hr capsule Take 1 capsule (240 mg total) by mouth daily. (Patient not taking: Reported on 02/23/2016) 30 capsule 0  . HYDROcodone-acetaminophen (NORCO/VICODIN) 5-325 MG tablet Take 1-2 tablets by mouth every 4 (four) hours as needed for moderate pain or severe pain. (Patient not taking: Reported on 02/23/2016) 25 tablet 0  . ondansetron (ZOFRAN) 8 MG tablet Take 8 mg by mouth 2 (two) times daily as needed for nausea or vomiting.    . prochlorperazine (COMPAZINE) 10 MG tablet Take 10 mg  by mouth every 6 (six) hours as needed for nausea or vomiting.    . zinc oxide (BALMEX) 11.3 % CREA cream Apply 1 application topically as needed (for irritation).     No current facility-administered medications for this encounter.     Physical Findings: The patient is in no acute distress. Patient is alert and oriented.  height is '5\' 4"'  (1.626 m) and weight is 181 lb 9.6 oz (82.4 kg). Her oral temperature is 98.2 F (36.8 C). Her blood pressure is 189/96 (abnormal) and her pulse is 98. Her oxygen  saturation is 100%. .  No significant changes. General: Alert and oriented, in no acute distress HEENT: Head is normocephalic. Extraocular movements are intact. Oropharynx is clear. Neck: Neck is supple, no palpable cervical or supraclavicular lymphadenopathy. Heart: Regular in rate and rhythm with no murmurs, rubs, or gallops. Chest: Clear to auscultation bilaterally, with no rhonchi, wheezes, or rales. She has a portacath in her left chest Psychiatric: Judgment and insight are intact. Affect is appropriate. Breast: Right breast has a faint scar in the periareolar area, upper inner aspect. Scar tissue in this area, no dominant mass. No nipple discharge or bleeding. Left breast no palpable mass or nipple discharge.   Lab Findings: Lab Results  Component Value Date   WBC 4.2 02/17/2016   HGB 9.6 (L) 02/17/2016   HCT 29.2 (L) 02/17/2016   MCV 107 (H) 02/17/2016   PLT 245 02/17/2016    Radiographic Findings: No results found.  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 The patient has completed her adjuvant chemotherapy consisting of Taxotere and carboplatinum. She is now ready to proceed with radiation therapy as breast conservation treatment.  I discussed course of treatment side effects and potential toxicities of radiation therapy in this situation with the patient. She appears to understand wishes to proceed with planned course of treatment.  Plan: Simulation is scheduled for 02/24/16 at 11 am.  Anticipate between 4 and 6 weeks of radiation therapy  ____________________________________  This document serves as a record of services personally performed by Gery Pray, MD. It was created on his behalf by Bethann Humble, a trained medical scribe. The creation of this record is based on the scribe's personal observations and the provider's statements to them. This document has been checked and approved by the attending provider.

## 2016-02-23 NOTE — Progress Notes (Signed)
Please see the Nurse Progress Note in the MD Initial Consult Encounter for this patient. 

## 2016-02-24 ENCOUNTER — Ambulatory Visit
Admission: RE | Admit: 2016-02-24 | Discharge: 2016-02-24 | Disposition: A | Discharge status: Home / Self Care | Payer: Medicare Other | Source: Ambulatory Visit | Attending: Radiation Oncology | Admitting: Radiation Oncology

## 2016-02-24 DIAGNOSIS — C50911 Malignant neoplasm of unspecified site of right female breast: Secondary | ICD-10-CM

## 2016-02-24 DIAGNOSIS — Z171 Estrogen receptor negative status [ER-]: Principal | ICD-10-CM

## 2016-02-24 DIAGNOSIS — C50211 Malignant neoplasm of upper-inner quadrant of right female breast: Secondary | ICD-10-CM | POA: Diagnosis not present

## 2016-02-24 DIAGNOSIS — Z510 Encounter for antineoplastic radiation therapy: Secondary | ICD-10-CM | POA: Diagnosis not present

## 2016-02-27 NOTE — Progress Notes (Signed)
  Radiation Oncology         [phone] ________________________________  Name: [name] MRN: [id number]  Date: 02/24/2016  DOB: 07-03-1945  SIMULATION AND TREATMENT PLANNING NOTE    ICD-9-CM ICD-10-CM   1. 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  Identity was confirmed.  All relevant records and images related to the planned course of therapy were reviewed.  The patient freely provided informed written consent to proceed with treatment after reviewing the details related to the planned course of therapy. The consent form was witnessed and verified by the simulation staff.  Then, the patient was set-up in a stable reproducible  supine position for radiation therapy.  CT images were obtained.  Surface markings were placed.  The CT images were loaded into the planning software.  Then the target and avoidance structures were contoured.  Treatment planning then occurred.  The radiation prescription was entered and confirmed.  Then, I designed and supervised the construction of a total of 3 medically necessary complex treatment devices.  I have requested : 3D Simulation  I have requested a DVH of the following structures: heart, lungs, lumpectomy cavity..  I have ordered:dose calc.  PLAN:  The patient will receive 50.4 Gy in 28 fractions, Followed by a boost to the lumpectomy cavity of 10 gray and a cumulative dose of 60.4 gray.  -----------------------------------  Blair Promise, PhD, MD

## 2016-03-03 DIAGNOSIS — C50911 Malignant neoplasm of unspecified site of right female breast: Secondary | ICD-10-CM | POA: Diagnosis not present

## 2016-03-03 DIAGNOSIS — Z510 Encounter for antineoplastic radiation therapy: Secondary | ICD-10-CM | POA: Diagnosis not present

## 2016-03-03 DIAGNOSIS — C50211 Malignant neoplasm of upper-inner quadrant of right female breast: Secondary | ICD-10-CM | POA: Diagnosis not present

## 2016-03-14 ENCOUNTER — Other Ambulatory Visit: Payer: Self-pay | Admitting: Family

## 2016-03-14 DIAGNOSIS — M899 Disorder of bone, unspecified: Secondary | ICD-10-CM

## 2016-03-15 ENCOUNTER — Ambulatory Visit
Admission: RE | Admit: 2016-03-15 | Discharge: 2016-03-15 | Disposition: A | Discharge status: Home / Self Care | Payer: Medicare Other | Source: Ambulatory Visit | Attending: Radiation Oncology | Admitting: Radiation Oncology

## 2016-03-15 DIAGNOSIS — Z171 Estrogen receptor negative status [ER-]: Principal | ICD-10-CM

## 2016-03-15 DIAGNOSIS — C50911 Malignant neoplasm of unspecified site of right female breast: Secondary | ICD-10-CM

## 2016-03-15 DIAGNOSIS — Z510 Encounter for antineoplastic radiation therapy: Secondary | ICD-10-CM | POA: Diagnosis not present

## 2016-03-15 DIAGNOSIS — C50211 Malignant neoplasm of upper-inner quadrant of right female breast: Secondary | ICD-10-CM | POA: Diagnosis not present

## 2016-03-15 NOTE — Progress Notes (Signed)
  Radiation Oncology         [phone] ________________________________  Name: [name] MRN: [id number]  Date: 03/15/2016  DOB: [birth year]  Simulation Verification Note    ICD-9-CM ICD-10-CM   1. Malignant neoplasm of right breast, stage 1, estrogen receptor negative ([facility]) 174.9 C50.911    V86.1 Z17.1     Status: outpatient  NARRATIVE: The patient was brought to the treatment unit and placed in the planned treatment position. The clinical setup was verified. Then port films were obtained and uploaded to the radiation oncology medical record software.  The treatment beams were carefully compared against the planned radiation fields. The position location and shape of the radiation fields was reviewed. They targeted volume of tissue appears to be appropriately covered by the radiation beams. Organs at risk appear to be excluded as planned.  Based on my personal review, I approved the simulation verification. The patient's treatment will proceed as planned.  -----------------------------------  Blair Promise, PhD, MD

## 2016-03-16 ENCOUNTER — Ambulatory Visit
Admission: RE | Admit: 2016-03-16 | Discharge: 2016-03-16 | Disposition: A | Discharge status: Home / Self Care | Payer: Medicare Other | Source: Ambulatory Visit | Attending: Radiation Oncology | Admitting: Radiation Oncology

## 2016-03-16 DIAGNOSIS — C50911 Malignant neoplasm of unspecified site of right female breast: Secondary | ICD-10-CM | POA: Diagnosis not present

## 2016-03-16 DIAGNOSIS — Z510 Encounter for antineoplastic radiation therapy: Secondary | ICD-10-CM | POA: Diagnosis not present

## 2016-03-16 DIAGNOSIS — C50211 Malignant neoplasm of upper-inner quadrant of right female breast: Secondary | ICD-10-CM | POA: Diagnosis not present

## 2016-03-17 ENCOUNTER — Ambulatory Visit
Admission: RE | Admit: 2016-03-17 | Discharge: 2016-03-17 | Disposition: A | Discharge status: Home / Self Care | Payer: Medicare Other | Source: Ambulatory Visit | Attending: Radiation Oncology | Admitting: Radiation Oncology

## 2016-03-17 DIAGNOSIS — Z510 Encounter for antineoplastic radiation therapy: Secondary | ICD-10-CM | POA: Diagnosis not present

## 2016-03-17 DIAGNOSIS — C50911 Malignant neoplasm of unspecified site of right female breast: Secondary | ICD-10-CM | POA: Diagnosis not present

## 2016-03-17 DIAGNOSIS — C50211 Malignant neoplasm of upper-inner quadrant of right female breast: Secondary | ICD-10-CM | POA: Diagnosis not present

## 2016-03-20 ENCOUNTER — Ambulatory Visit
Admission: RE | Admit: 2016-03-20 | Discharge: 2016-03-20 | Disposition: A | Discharge status: Home / Self Care | Payer: Medicare Other | Source: Ambulatory Visit | Attending: Radiation Oncology | Admitting: Radiation Oncology

## 2016-03-20 DIAGNOSIS — C50211 Malignant neoplasm of upper-inner quadrant of right female breast: Secondary | ICD-10-CM | POA: Diagnosis not present

## 2016-03-20 DIAGNOSIS — Z510 Encounter for antineoplastic radiation therapy: Secondary | ICD-10-CM | POA: Diagnosis not present

## 2016-03-20 DIAGNOSIS — C50911 Malignant neoplasm of unspecified site of right female breast: Secondary | ICD-10-CM | POA: Diagnosis not present

## 2016-03-21 ENCOUNTER — Ambulatory Visit
Admission: RE | Admit: 2016-03-21 | Discharge: 2016-03-21 | Disposition: A | Discharge status: Home / Self Care | Payer: Medicare Other | Source: Ambulatory Visit | Attending: Radiation Oncology | Admitting: Radiation Oncology

## 2016-03-21 VITALS — BP 165/85 | HR 83 | Temp 98.1°F | Ht 64.0 in | Wt 181.8 lb

## 2016-03-21 DIAGNOSIS — Z171 Estrogen receptor negative status [ER-]: Principal | ICD-10-CM

## 2016-03-21 DIAGNOSIS — C50911 Malignant neoplasm of unspecified site of right female breast: Secondary | ICD-10-CM

## 2016-03-21 DIAGNOSIS — C50211 Malignant neoplasm of upper-inner quadrant of right female breast: Secondary | ICD-10-CM | POA: Diagnosis not present

## 2016-03-21 DIAGNOSIS — Z510 Encounter for antineoplastic radiation therapy: Secondary | ICD-10-CM | POA: Diagnosis not present

## 2016-03-21 MED ORDER — RADIAPLEXRX EX GEL
Freq: Once | CUTANEOUS | Status: AC
  Administered 2016-03-21: 10:00:00 via TOPICAL

## 2016-03-21 MED ORDER — ALRA NON-METALLIC DEODORANT (RAD-ONC)
1.0000 "application " | Freq: Once | TOPICAL | Status: AC
  Administered 2016-03-21: 1 via TOPICAL

## 2016-03-21 NOTE — Progress Notes (Signed)
Pt here for patient teaching.  Pt given Radiation and You booklet, skin care instructions, Alra deodorant and Radiaplex gel. Pt reports they have not watched the Radiation Therapy Education video and has been given the link to watch at home. Reviewed areas of pertinence such as fatigue, skin changes, breast tenderness and breast swelling . Pt able to give teach back of to pat skin and use unscented/gentle soap,apply Radiaplex bid and avoid applying anything to skin within 4 hours of treatment. Pt demonstrated understanding and verbalizes understanding of information given and will contact nursing with any questions or concerns.          

## 2016-03-21 NOTE — Progress Notes (Signed)
  Radiation Oncology         [phone] ________________________________  Name: [name] MRN: [id number]  Date: 03/21/2016  DOB: [birth year]  Weekly Radiation Therapy Management    ICD-9-CM ICD-10-CM   1. Malignant neoplasm of right breast, stage 1, estrogen receptor negative ([facility]) 174.9 C50.911    V86.1 Z17.1      Current Dose: 7.2 Gy     Planned Dose:  60.4 Gy  Narrative . . . . . . . . The patient presents for routine under treatment assessment.                                   The patient is without complaint.                                 Set-up films were reviewed.                                 The chart was checked. Physical Findings. . .  height is 5\' 4"  (1.626 m) and weight is 181 lb 12.8 oz (82.5 kg). Her oral temperature is 98.1 F (36.7 C). Her blood pressure is 165/85 (abnormal) and her pulse is 83. Her oxygen saturation is 100%. . Weight essentially stable.  No significant changes. Lungs are clear to auscultation bilaterally. Heart has regular rate and rhythm. No palpable cervical, supraclavicular, or axillary adenopathy. Abdomen soft, non-tender, normal bowel sounds. No appreciable skin reaction in the right breast.  Impression . . . . . . . The patient is tolerating radiation. Plan . . . . . . . . . . . . Continue treatment as planned.  ________________________________   Blair Promise, PhD, MD   This document serves as a record of services personally performed by Gery Pray, MD. It was created on his behalf by Bethann Humble, a trained medical scribe. The creation of this record is based on the scribe's personal observations and the provider's statements to them. This document has been checked and approved by the attending provider.

## 2016-03-21 NOTE — Progress Notes (Signed)
[name] has completed 4 fractions to her right breast.  She denies having pain or fatigue.  The skin on her right breast is intact.  BP (!) 165/85 (BP Location: Left Arm, Patient Position: Sitting)   Pulse 83   Temp 98.1 F (36.7 C) (Oral)   Ht 5\' 4"  (1.626 m)   Wt 181 lb 12.8 oz (82.5 kg)   LMP 05/22/2000   SpO2 100%   BMI 31.21 kg/m    Wt Readings from Last 3 Encounters:  03/21/16 181 lb 12.8 oz (82.5 kg)  02/23/16 181 lb 9.6 oz (82.4 kg)  02/17/16 179 lb (81.2 kg)

## 2016-03-22 ENCOUNTER — Ambulatory Visit
Admission: RE | Admit: 2016-03-22 | Discharge: 2016-03-22 | Disposition: A | Discharge status: Home / Self Care | Payer: Medicare Other | Source: Ambulatory Visit | Attending: Radiation Oncology | Admitting: Radiation Oncology

## 2016-03-22 DIAGNOSIS — Z510 Encounter for antineoplastic radiation therapy: Secondary | ICD-10-CM | POA: Diagnosis not present

## 2016-03-22 DIAGNOSIS — C50911 Malignant neoplasm of unspecified site of right female breast: Secondary | ICD-10-CM | POA: Diagnosis not present

## 2016-03-22 DIAGNOSIS — C50211 Malignant neoplasm of upper-inner quadrant of right female breast: Secondary | ICD-10-CM | POA: Diagnosis not present

## 2016-03-23 ENCOUNTER — Ambulatory Visit
Admission: RE | Admit: 2016-03-23 | Discharge: 2016-03-23 | Disposition: A | Discharge status: Home / Self Care | Payer: Medicare Other | Source: Ambulatory Visit | Attending: Radiation Oncology | Admitting: Radiation Oncology

## 2016-03-23 DIAGNOSIS — C50911 Malignant neoplasm of unspecified site of right female breast: Secondary | ICD-10-CM | POA: Diagnosis not present

## 2016-03-23 DIAGNOSIS — Z510 Encounter for antineoplastic radiation therapy: Secondary | ICD-10-CM | POA: Diagnosis not present

## 2016-03-23 DIAGNOSIS — C50211 Malignant neoplasm of upper-inner quadrant of right female breast: Secondary | ICD-10-CM | POA: Diagnosis not present

## 2016-03-24 ENCOUNTER — Ambulatory Visit
Admission: RE | Admit: 2016-03-24 | Discharge: 2016-03-24 | Disposition: A | Discharge status: Home / Self Care | Payer: Medicare Other | Source: Ambulatory Visit | Attending: Radiation Oncology | Admitting: Radiation Oncology

## 2016-03-24 DIAGNOSIS — C50211 Malignant neoplasm of upper-inner quadrant of right female breast: Secondary | ICD-10-CM | POA: Diagnosis not present

## 2016-03-24 DIAGNOSIS — Z510 Encounter for antineoplastic radiation therapy: Secondary | ICD-10-CM | POA: Diagnosis not present

## 2016-03-24 DIAGNOSIS — C50911 Malignant neoplasm of unspecified site of right female breast: Secondary | ICD-10-CM | POA: Diagnosis not present

## 2016-03-27 ENCOUNTER — Ambulatory Visit
Admission: RE | Admit: 2016-03-27 | Discharge: 2016-03-27 | Disposition: A | Discharge status: Home / Self Care | Payer: Medicare Other | Source: Ambulatory Visit | Attending: Radiation Oncology | Admitting: Radiation Oncology

## 2016-03-27 DIAGNOSIS — C50911 Malignant neoplasm of unspecified site of right female breast: Secondary | ICD-10-CM | POA: Diagnosis not present

## 2016-03-27 DIAGNOSIS — Z510 Encounter for antineoplastic radiation therapy: Secondary | ICD-10-CM | POA: Diagnosis not present

## 2016-03-27 DIAGNOSIS — C50211 Malignant neoplasm of upper-inner quadrant of right female breast: Secondary | ICD-10-CM | POA: Diagnosis not present

## 2016-03-28 ENCOUNTER — Ambulatory Visit
Admission: RE | Admit: 2016-03-28 | Discharge: 2016-03-28 | Disposition: A | Discharge status: Home / Self Care | Payer: Medicare Other | Source: Ambulatory Visit | Attending: Radiation Oncology | Admitting: Radiation Oncology

## 2016-03-28 ENCOUNTER — Ambulatory Visit: Admission: RE | Admit: 2016-03-28 | Payer: Medicare Other | Source: Ambulatory Visit | Admitting: Radiation Oncology

## 2016-03-28 DIAGNOSIS — C50211 Malignant neoplasm of upper-inner quadrant of right female breast: Secondary | ICD-10-CM | POA: Diagnosis not present

## 2016-03-28 DIAGNOSIS — Z510 Encounter for antineoplastic radiation therapy: Secondary | ICD-10-CM | POA: Diagnosis not present

## 2016-03-28 DIAGNOSIS — C50911 Malignant neoplasm of unspecified site of right female breast: Secondary | ICD-10-CM | POA: Diagnosis not present

## 2016-03-29 ENCOUNTER — Ambulatory Visit
Admission: RE | Admit: 2016-03-29 | Discharge: 2016-03-29 | Disposition: A | Discharge status: Home / Self Care | Payer: Medicare Other | Source: Ambulatory Visit | Attending: Radiation Oncology | Admitting: Radiation Oncology

## 2016-03-29 ENCOUNTER — Encounter: Payer: Self-pay | Admitting: Radiation Oncology

## 2016-03-29 VITALS — BP 183/70 | HR 79 | Temp 97.8°F | Ht 64.0 in | Wt 181.2 lb

## 2016-03-29 DIAGNOSIS — C50211 Malignant neoplasm of upper-inner quadrant of right female breast: Secondary | ICD-10-CM | POA: Diagnosis not present

## 2016-03-29 DIAGNOSIS — Z171 Estrogen receptor negative status [ER-]: Principal | ICD-10-CM

## 2016-03-29 DIAGNOSIS — C50911 Malignant neoplasm of unspecified site of right female breast: Secondary | ICD-10-CM

## 2016-03-29 DIAGNOSIS — Z510 Encounter for antineoplastic radiation therapy: Secondary | ICD-10-CM | POA: Diagnosis not present

## 2016-03-29 NOTE — Progress Notes (Signed)
[name] has completed 10 fractions to her right breast.  She denies having pain and fatigue.  She is using radiaplex.  The skin on her right breast has slight hyperpigmentation.  BP (!) 183/70 (BP Location: Left Arm, Patient Position: Sitting)   Pulse 79   Temp 97.8 F (36.6 C) (Oral)   Ht 5\' 4"  (1.626 m)   Wt 181 lb 3.2 oz (82.2 kg)   LMP 05/22/2000   SpO2 100%   BMI 31.10 kg/m    Wt Readings from Last 3 Encounters:  03/29/16 181 lb 3.2 oz (82.2 kg)  03/21/16 181 lb 12.8 oz (82.5 kg)  02/23/16 181 lb 9.6 oz (82.4 kg)

## 2016-03-29 NOTE — Progress Notes (Signed)
  Radiation Oncology         [phone] ________________________________  Name: [name] MRN: [id number]  Date: 03/29/2016  DOB: 1946/01/12  Weekly Radiation Therapy Management    ICD-9-CM ICD-10-CM   1. Malignant neoplasm of right breast, stage 1, estrogen receptor negative ([facility]) 174.9 C50.911    V86.1 Z17.1      Current Dose: 18 Gy     Planned Dose:  60.4 Gy  Narrative . . . . . . . . The patient presents for routine under treatment assessment. The patient has completed 10 out of 33 fractions to her right breast. She denies having pain or fatigue. She reports using radiaplex. Per nursing, skin on her right breast has slight hyperpigmentation.                                   The patient is without complaint.                                 Set-up films were reviewed.                                 The chart was checked. Physical Findings. . .  height is 5\' 4"  (1.626 m) and weight is 181 lb 3.2 oz (82.2 kg). Her oral temperature is 97.8 F (36.6 C). Her blood pressure is 183/70 (abnormal) and her pulse is 79. Her oxygen saturation is 100%. . Weight essentially stable.  No significant changes. Lungs are clear to auscultation bilaterally. Heart has regular rate and rhythm. No palpable cervical, supraclavicular, or axillary adenopathy. Abdomen soft, non-tender, normal bowel sounds. Mild hyperpigmentation of the right breast noted.  Impression . . . . . . . The patient is tolerating radiation. Plan . . . . . . . . . . . . Continue treatment as planned.  ________________________________   Blair Promise, PhD, MD   This document serves as a record of services personally performed by Gery Pray, MD. It was created on his behalf by Maryla Morrow, a trained medical scribe. The creation of this record is based on the scribe's personal observations and the provider's statements to them. This document has been checked and approved by the attending provider.

## 2016-03-30 ENCOUNTER — Ambulatory Visit
Admission: RE | Admit: 2016-03-30 | Discharge: 2016-03-30 | Disposition: A | Discharge status: Home / Self Care | Payer: Medicare Other | Source: Ambulatory Visit | Attending: Radiation Oncology | Admitting: Radiation Oncology

## 2016-03-30 DIAGNOSIS — C50911 Malignant neoplasm of unspecified site of right female breast: Secondary | ICD-10-CM | POA: Diagnosis not present

## 2016-03-30 DIAGNOSIS — Z510 Encounter for antineoplastic radiation therapy: Secondary | ICD-10-CM | POA: Diagnosis not present

## 2016-03-30 DIAGNOSIS — C50211 Malignant neoplasm of upper-inner quadrant of right female breast: Secondary | ICD-10-CM | POA: Diagnosis not present

## 2016-03-31 ENCOUNTER — Ambulatory Visit
Admission: RE | Admit: 2016-03-31 | Discharge: 2016-03-31 | Disposition: A | Discharge status: Home / Self Care | Payer: Medicare Other | Source: Ambulatory Visit | Attending: Radiation Oncology | Admitting: Radiation Oncology

## 2016-03-31 DIAGNOSIS — Z510 Encounter for antineoplastic radiation therapy: Secondary | ICD-10-CM | POA: Diagnosis not present

## 2016-03-31 DIAGNOSIS — C50911 Malignant neoplasm of unspecified site of right female breast: Secondary | ICD-10-CM | POA: Diagnosis not present

## 2016-03-31 DIAGNOSIS — C50211 Malignant neoplasm of upper-inner quadrant of right female breast: Secondary | ICD-10-CM | POA: Diagnosis not present

## 2016-04-03 ENCOUNTER — Ambulatory Visit
Admission: RE | Admit: 2016-04-03 | Discharge: 2016-04-03 | Disposition: A | Discharge status: Home / Self Care | Payer: Medicare Other | Source: Ambulatory Visit | Attending: Radiation Oncology | Admitting: Radiation Oncology

## 2016-04-03 DIAGNOSIS — Z510 Encounter for antineoplastic radiation therapy: Secondary | ICD-10-CM | POA: Diagnosis not present

## 2016-04-03 DIAGNOSIS — C50211 Malignant neoplasm of upper-inner quadrant of right female breast: Secondary | ICD-10-CM | POA: Diagnosis not present

## 2016-04-03 DIAGNOSIS — C50911 Malignant neoplasm of unspecified site of right female breast: Secondary | ICD-10-CM | POA: Diagnosis not present

## 2016-04-04 ENCOUNTER — Encounter: Payer: Self-pay | Admitting: Radiation Oncology

## 2016-04-04 ENCOUNTER — Ambulatory Visit
Admission: RE | Admit: 2016-04-04 | Discharge: 2016-04-04 | Disposition: A | Discharge status: Home / Self Care | Payer: Medicare Other | Source: Ambulatory Visit | Attending: Radiation Oncology | Admitting: Radiation Oncology

## 2016-04-04 VITALS — BP 155/77 | HR 74 | Temp 97.8°F | Ht 64.0 in | Wt 177.8 lb

## 2016-04-04 DIAGNOSIS — R5383 Other fatigue: Secondary | ICD-10-CM | POA: Insufficient documentation

## 2016-04-04 DIAGNOSIS — Z510 Encounter for antineoplastic radiation therapy: Secondary | ICD-10-CM | POA: Insufficient documentation

## 2016-04-04 DIAGNOSIS — L819 Disorder of pigmentation, unspecified: Secondary | ICD-10-CM | POA: Insufficient documentation

## 2016-04-04 DIAGNOSIS — C50211 Malignant neoplasm of upper-inner quadrant of right female breast: Secondary | ICD-10-CM | POA: Diagnosis not present

## 2016-04-04 DIAGNOSIS — C50911 Malignant neoplasm of unspecified site of right female breast: Secondary | ICD-10-CM

## 2016-04-04 DIAGNOSIS — Z923 Personal history of irradiation: Secondary | ICD-10-CM | POA: Insufficient documentation

## 2016-04-04 DIAGNOSIS — Z171 Estrogen receptor negative status [ER-]: Secondary | ICD-10-CM

## 2016-04-04 MED ORDER — RADIAPLEXRX EX GEL
Freq: Once | CUTANEOUS | Status: AC
  Administered 2016-04-04: 10:00:00 via TOPICAL

## 2016-04-04 NOTE — Progress Notes (Signed)
[name] has completed 14 fractions to her right breast.  She denies having any pain.  She reports having fatigue in the evenings.  She is using radiaplex and has been provided with a refill.  The skin on her right breast has hyperpigmentation.  BP (!) 155/77 (BP Location: Left Arm, Patient Position: Sitting)   Pulse 74   Temp 97.8 F (36.6 C) (Oral)   Ht 5\' 4"  (1.626 m)   Wt 177 lb 12.8 oz (80.6 kg)   LMP 05/22/2000   SpO2 100%   BMI 30.52 kg/m    Wt Readings from Last 3 Encounters:  04/04/16 177 lb 12.8 oz (80.6 kg)  03/29/16 181 lb 3.2 oz (82.2 kg)  03/21/16 181 lb 12.8 oz (82.5 kg)

## 2016-04-04 NOTE — Progress Notes (Signed)
  Radiation Oncology         (336) [phone] ________________________________  Name: [name] MRN: [id number]  Date: 04/04/2016  DOB: 03-06-1946  Weekly Radiation Therapy Management    ICD-9-CM ICD-10-CM   1. Malignant neoplasm of right breast, stage 1, estrogen receptor negative ([facility]) 174.9 C50.911 hyaluronate sodium (RADIAPLEXRX) gel   V86.1 Z17.1      Current Dose: 25.2 Gy     Planned Dose:  60.4 Gy  Narrative . . . . . . . . The patient presents for routine under treatment assessment.                                   [name] has completed 14 fractions to her right breast.  She denies having any pain.  She reports having fatigue in the evenings.  She is using radiaplex and has been provided with a refill.  The skin on her right breast has hyperpigmentation.                                 Set-up films were reviewed.                                 The chart was checked. Physical Findings. . .  height is 5\' 4"  (1.626 m) and weight is 177 lb 12.8 oz (80.6 kg). Her oral temperature is 97.8 F (36.6 C). Her blood pressure is 155/77 (abnormal) and her pulse is 74. Her oxygen saturation is 100%. . Left breast shows mild hyperpigmentation changes. No significant erythema. Impression . . . . . . . The patient is tolerating radiation. Plan . . . . . . . . . . . . Continue treatment as planned.  ________________________________   Blair Promise, PhD, MD

## 2016-04-05 ENCOUNTER — Ambulatory Visit
Admission: RE | Admit: 2016-04-05 | Discharge: 2016-04-05 | Disposition: A | Discharge status: Home / Self Care | Payer: Medicare Other | Source: Ambulatory Visit | Attending: Radiation Oncology | Admitting: Radiation Oncology

## 2016-04-05 DIAGNOSIS — Z510 Encounter for antineoplastic radiation therapy: Secondary | ICD-10-CM | POA: Diagnosis not present

## 2016-04-05 DIAGNOSIS — C50211 Malignant neoplasm of upper-inner quadrant of right female breast: Secondary | ICD-10-CM | POA: Diagnosis not present

## 2016-04-05 DIAGNOSIS — C50911 Malignant neoplasm of unspecified site of right female breast: Secondary | ICD-10-CM | POA: Diagnosis not present

## 2016-04-06 ENCOUNTER — Ambulatory Visit
Admission: RE | Admit: 2016-04-06 | Discharge: 2016-04-06 | Disposition: A | Discharge status: Home / Self Care | Payer: Medicare Other | Source: Ambulatory Visit | Attending: Radiation Oncology | Admitting: Radiation Oncology

## 2016-04-06 DIAGNOSIS — C50911 Malignant neoplasm of unspecified site of right female breast: Secondary | ICD-10-CM | POA: Diagnosis not present

## 2016-04-06 DIAGNOSIS — Z510 Encounter for antineoplastic radiation therapy: Secondary | ICD-10-CM | POA: Diagnosis not present

## 2016-04-06 DIAGNOSIS — C50211 Malignant neoplasm of upper-inner quadrant of right female breast: Secondary | ICD-10-CM | POA: Diagnosis not present

## 2016-04-07 ENCOUNTER — Ambulatory Visit
Admission: RE | Admit: 2016-04-07 | Discharge: 2016-04-07 | Disposition: A | Discharge status: Home / Self Care | Payer: Medicare Other | Source: Ambulatory Visit | Attending: Radiation Oncology | Admitting: Radiation Oncology

## 2016-04-07 DIAGNOSIS — C50911 Malignant neoplasm of unspecified site of right female breast: Secondary | ICD-10-CM | POA: Diagnosis not present

## 2016-04-07 DIAGNOSIS — Z510 Encounter for antineoplastic radiation therapy: Secondary | ICD-10-CM | POA: Diagnosis not present

## 2016-04-07 DIAGNOSIS — C50211 Malignant neoplasm of upper-inner quadrant of right female breast: Secondary | ICD-10-CM | POA: Diagnosis not present

## 2016-04-09 ENCOUNTER — Ambulatory Visit
Admission: RE | Admit: 2016-04-09 | Discharge: 2016-04-09 | Disposition: A | Discharge status: Home / Self Care | Payer: Medicare Other | Source: Ambulatory Visit | Attending: Radiation Oncology | Admitting: Radiation Oncology

## 2016-04-09 DIAGNOSIS — Z510 Encounter for antineoplastic radiation therapy: Secondary | ICD-10-CM | POA: Diagnosis not present

## 2016-04-09 DIAGNOSIS — C50211 Malignant neoplasm of upper-inner quadrant of right female breast: Secondary | ICD-10-CM | POA: Diagnosis not present

## 2016-04-09 DIAGNOSIS — C50911 Malignant neoplasm of unspecified site of right female breast: Secondary | ICD-10-CM | POA: Diagnosis not present

## 2016-04-10 ENCOUNTER — Ambulatory Visit
Admission: RE | Admit: 2016-04-10 | Discharge: 2016-04-10 | Disposition: A | Discharge status: Home / Self Care | Payer: Medicare Other | Source: Ambulatory Visit | Attending: Radiation Oncology | Admitting: Radiation Oncology

## 2016-04-10 DIAGNOSIS — C50211 Malignant neoplasm of upper-inner quadrant of right female breast: Secondary | ICD-10-CM | POA: Diagnosis not present

## 2016-04-10 DIAGNOSIS — Z510 Encounter for antineoplastic radiation therapy: Secondary | ICD-10-CM | POA: Diagnosis not present

## 2016-04-10 DIAGNOSIS — C50911 Malignant neoplasm of unspecified site of right female breast: Secondary | ICD-10-CM | POA: Diagnosis not present

## 2016-04-11 ENCOUNTER — Encounter: Payer: Self-pay | Admitting: Radiation Oncology

## 2016-04-11 ENCOUNTER — Ambulatory Visit
Admission: RE | Admit: 2016-04-11 | Discharge: 2016-04-11 | Disposition: A | Discharge status: Home / Self Care | Payer: Medicare Other | Source: Ambulatory Visit | Attending: Radiation Oncology | Admitting: Radiation Oncology

## 2016-04-11 VITALS — BP 174/84 | HR 74 | Temp 98.1°F | Resp 20 | Wt 177.6 lb

## 2016-04-11 DIAGNOSIS — Z171 Estrogen receptor negative status [ER-]: Secondary | ICD-10-CM | POA: Insufficient documentation

## 2016-04-11 DIAGNOSIS — C50911 Malignant neoplasm of unspecified site of right female breast: Secondary | ICD-10-CM

## 2016-04-11 DIAGNOSIS — Z510 Encounter for antineoplastic radiation therapy: Secondary | ICD-10-CM | POA: Insufficient documentation

## 2016-04-11 DIAGNOSIS — C50211 Malignant neoplasm of upper-inner quadrant of right female breast: Secondary | ICD-10-CM | POA: Diagnosis not present

## 2016-04-11 NOTE — Progress Notes (Signed)
  Radiation Oncology         [phone] ________________________________  Name: [name] MRN: [id number]  Date: 04/11/2016  DOB: 16-Mar-1946  Weekly Radiation Therapy Management    ICD-9-CM ICD-10-CM   1. Malignant neoplasm of right breast, stage 1, estrogen receptor negative ([facility]) 174.9 C50.911    V86.1 Z17.1      Current Dose: 36 Gy     Planned Dose:  60.4 Gy  Narrative . . . . . . . . The patient presents for routine under treatment assessment.                                   The patient has completed 20 out of 33 treatments to her right breast. She reports using radiaplex bid. Denies pain. The patient reports a good appetite Reports slight fatigue at times, which she attributes to working while she is going through treatment.                                 Set-up films were reviewed.                                 The chart was checked. Physical Findings. . .  weight is 177 lb 9.6 oz (80.6 kg). Her oral temperature is 98.1 F (36.7 C). Her blood pressure is 174/84 (abnormal) and her pulse is 74. Her respiration is 20.   In general this is a well appearing female in no acute distress. She's alert and oriented x4 and appropriate throughout the examination. Cardiopulmonary assessment is negative for acute distress and she exhibits normal effort. Mild hyperpigmentation changes noted within the treatment field. Impression . . . . . . . The patient is tolerating radiation. Plan . . . . . . . . . . . . Continue treatment as planned.  ________________________________   Blair Promise, PhD, MD  This document serves as a record of services personally performed by Gery Pray, MD. It was created on his behalf by Maryla Morrow, a trained medical scribe. The creation of this record is based on the scribe's personal observations and the provider's statements to them. This document has been checked and approved by the attending provider.

## 2016-04-11 NOTE — Progress Notes (Signed)
weekly rad txs right breast, 20/33 completed,  slight tanning, skin intact, using radiaplex bid, no pain, appetite good, slight fatigue at times,  BP (!) 174/84 (BP Location: Left Arm, Patient Position: Sitting, Cuff Size: Normal)   Pulse 74   Temp 98.1 F (36.7 C) (Oral)   Resp 20   Wt 177 lb 9.6 oz (80.6 kg)   LMP 05/22/2000   BMI 30.48 kg/m   Wt Readings from Last 3 Encounters:  04/11/16 177 lb 9.6 oz (80.6 kg)  04/04/16 177 lb 12.8 oz (80.6 kg)  03/29/16 181 lb 3.2 oz (82.2 kg)

## 2016-04-12 ENCOUNTER — Ambulatory Visit
Admission: RE | Admit: 2016-04-12 | Discharge: 2016-04-12 | Disposition: A | Discharge status: Home / Self Care | Payer: Medicare Other | Source: Ambulatory Visit | Attending: Radiation Oncology | Admitting: Radiation Oncology

## 2016-04-12 DIAGNOSIS — C50911 Malignant neoplasm of unspecified site of right female breast: Secondary | ICD-10-CM | POA: Diagnosis not present

## 2016-04-12 DIAGNOSIS — Z171 Estrogen receptor negative status [ER-]: Secondary | ICD-10-CM | POA: Diagnosis not present

## 2016-04-12 DIAGNOSIS — C50211 Malignant neoplasm of upper-inner quadrant of right female breast: Secondary | ICD-10-CM | POA: Diagnosis not present

## 2016-04-12 DIAGNOSIS — Z510 Encounter for antineoplastic radiation therapy: Secondary | ICD-10-CM | POA: Diagnosis not present

## 2016-04-14 ENCOUNTER — Ambulatory Visit: Payer: Medicare Other

## 2016-04-17 ENCOUNTER — Ambulatory Visit
Admission: RE | Admit: 2016-04-17 | Discharge: 2016-04-17 | Disposition: A | Discharge status: Home / Self Care | Payer: Medicare Other | Source: Ambulatory Visit | Attending: Radiation Oncology | Admitting: Radiation Oncology

## 2016-04-17 DIAGNOSIS — Z171 Estrogen receptor negative status [ER-]: Secondary | ICD-10-CM | POA: Diagnosis not present

## 2016-04-17 DIAGNOSIS — C50911 Malignant neoplasm of unspecified site of right female breast: Secondary | ICD-10-CM | POA: Diagnosis not present

## 2016-04-17 DIAGNOSIS — Z510 Encounter for antineoplastic radiation therapy: Secondary | ICD-10-CM | POA: Diagnosis not present

## 2016-04-17 DIAGNOSIS — C50211 Malignant neoplasm of upper-inner quadrant of right female breast: Secondary | ICD-10-CM | POA: Diagnosis not present

## 2016-04-18 ENCOUNTER — Ambulatory Visit
Admission: RE | Admit: 2016-04-18 | Discharge: 2016-04-18 | Disposition: A | Discharge status: Home / Self Care | Payer: Medicare Other | Source: Ambulatory Visit | Attending: Radiation Oncology | Admitting: Radiation Oncology

## 2016-04-18 ENCOUNTER — Encounter: Payer: Self-pay | Admitting: Radiation Oncology

## 2016-04-18 VITALS — BP 173/103 | HR 77 | Temp 97.9°F | Ht 64.0 in | Wt 176.8 lb

## 2016-04-18 DIAGNOSIS — Z171 Estrogen receptor negative status [ER-]: Secondary | ICD-10-CM | POA: Diagnosis not present

## 2016-04-18 DIAGNOSIS — Z510 Encounter for antineoplastic radiation therapy: Secondary | ICD-10-CM | POA: Diagnosis not present

## 2016-04-18 DIAGNOSIS — C50211 Malignant neoplasm of upper-inner quadrant of right female breast: Secondary | ICD-10-CM | POA: Diagnosis not present

## 2016-04-18 DIAGNOSIS — C50911 Malignant neoplasm of unspecified site of right female breast: Secondary | ICD-10-CM | POA: Diagnosis not present

## 2016-04-18 NOTE — Progress Notes (Addendum)
Jaclyne [place] has completed 23 fractions to her right breast.  She denies having pain.  She reports having some fatigue.  She is using radiaplex gel.  The skin on her right breast has hyperpigmentation.  Her bp was elevated today at 175/95 and then 173/103 when retaken.  BP (!) 173/103 (BP Location: Left Wrist, Patient Position: Sitting)   Pulse 77   Temp 97.9 F (36.6 C) (Oral)   Ht 5\' 4"  (1.626 m)   Wt 176 lb 12.8 oz (80.2 kg)   LMP 05/22/2000   SpO2 100%   BMI 30.35 kg/m   Wt Readings from Last 3 Encounters:  04/18/16 176 lb 12.8 oz (80.2 kg)  04/11/16 177 lb 9.6 oz (80.6 kg)  04/04/16 177 lb 12.8 oz (80.6 kg)

## 2016-04-18 NOTE — Progress Notes (Signed)
  Radiation Oncology         [phone] ________________________________  Name: [name] MRN: [id number]  Date: 04/18/2016  DOB: 11/05/1945  Weekly Radiation Therapy Management    ICD-9-CM ICD-10-CM   1. Malignant neoplasm of right breast, stage 1, estrogen receptor negative ([facility]) 174.9 C50.911    V86.1 Z17.1      Current Dose: 41.4 Gy     Planned Dose:  60.4 Gy  Narrative . . . . . . . . The patient presents for routine under treatment assessment.                                  The patient has completed 23 fractions to her right breast. She denies having any pain. She reports having fatigue. She is using radiaplex gel as directed.  Nursing notes her BP was elevated today at 175/95 and then 173/103 when retaken.                                  Set-up films were reviewed.                                 The chart was checked. Physical Findings. . .  height is 5\' 4"  (1.626 m) and weight is 176 lb 12.8 oz (80.2 kg). Her oral temperature is 97.9 F (36.6 C). Her blood pressure is 173/103 (abnormal) and her pulse is 77. Her oxygen saturation is 100%. . Left breast shows mild hyperpigmentation changes. Impression . . . . . . . The patient is tolerating radiation. Plan . . . . . . . . . . . . Continue treatment as planned.  Recommended she follow-up with her primary care physician concerning her elevated blood pressure.  ________________________________   Blair Promise, PhD, MD  This document serves as a record of services personally performed by Gery Pray, MD. It was created on his behalf by Maryla Morrow, a trained medical scribe. The creation of this record is based on the scribe's personal observations and the provider's statements to them. This document has been checked and approved by the attending provider.

## 2016-04-19 ENCOUNTER — Ambulatory Visit
Admission: RE | Admit: 2016-04-19 | Discharge: 2016-04-19 | Disposition: A | Discharge status: Home / Self Care | Payer: Medicare Other | Source: Ambulatory Visit | Attending: Radiation Oncology | Admitting: Radiation Oncology

## 2016-04-19 DIAGNOSIS — Z171 Estrogen receptor negative status [ER-]: Secondary | ICD-10-CM | POA: Diagnosis not present

## 2016-04-19 DIAGNOSIS — Z510 Encounter for antineoplastic radiation therapy: Secondary | ICD-10-CM | POA: Diagnosis not present

## 2016-04-19 DIAGNOSIS — C50911 Malignant neoplasm of unspecified site of right female breast: Secondary | ICD-10-CM | POA: Diagnosis not present

## 2016-04-19 DIAGNOSIS — C50211 Malignant neoplasm of upper-inner quadrant of right female breast: Secondary | ICD-10-CM | POA: Diagnosis not present

## 2016-04-20 ENCOUNTER — Ambulatory Visit
Admission: RE | Admit: 2016-04-20 | Discharge: 2016-04-20 | Disposition: A | Discharge status: Home / Self Care | Payer: Medicare Other | Source: Ambulatory Visit | Attending: Radiation Oncology | Admitting: Radiation Oncology

## 2016-04-20 DIAGNOSIS — C50911 Malignant neoplasm of unspecified site of right female breast: Secondary | ICD-10-CM | POA: Diagnosis not present

## 2016-04-20 DIAGNOSIS — Z171 Estrogen receptor negative status [ER-]: Secondary | ICD-10-CM | POA: Diagnosis not present

## 2016-04-20 DIAGNOSIS — C50211 Malignant neoplasm of upper-inner quadrant of right female breast: Secondary | ICD-10-CM | POA: Diagnosis not present

## 2016-04-20 DIAGNOSIS — Z510 Encounter for antineoplastic radiation therapy: Secondary | ICD-10-CM | POA: Diagnosis not present

## 2016-04-21 ENCOUNTER — Ambulatory Visit (HOSPITAL_BASED_OUTPATIENT_CLINIC_OR_DEPARTMENT_OTHER): Payer: Medicare Other

## 2016-04-21 ENCOUNTER — Encounter: Payer: Self-pay | Admitting: *Deleted

## 2016-04-21 ENCOUNTER — Ambulatory Visit (HOSPITAL_BASED_OUTPATIENT_CLINIC_OR_DEPARTMENT_OTHER): Payer: Medicare Other | Admitting: Hematology & Oncology

## 2016-04-21 ENCOUNTER — Ambulatory Visit
Admission: RE | Admit: 2016-04-21 | Discharge: 2016-04-21 | Disposition: A | Discharge status: Home / Self Care | Payer: Medicare Other | Source: Ambulatory Visit | Attending: Radiation Oncology | Admitting: Radiation Oncology

## 2016-04-21 ENCOUNTER — Other Ambulatory Visit (HOSPITAL_BASED_OUTPATIENT_CLINIC_OR_DEPARTMENT_OTHER): Payer: Medicare Other

## 2016-04-21 VITALS — BP 178/86 | HR 85 | Temp 98.3°F | Resp 20 | Wt 178.0 lb

## 2016-04-21 DIAGNOSIS — C50211 Malignant neoplasm of upper-inner quadrant of right female breast: Secondary | ICD-10-CM | POA: Diagnosis not present

## 2016-04-21 DIAGNOSIS — C9001 Multiple myeloma in remission: Secondary | ICD-10-CM | POA: Diagnosis not present

## 2016-04-21 DIAGNOSIS — Z510 Encounter for antineoplastic radiation therapy: Secondary | ICD-10-CM | POA: Diagnosis not present

## 2016-04-21 DIAGNOSIS — M899 Disorder of bone, unspecified: Secondary | ICD-10-CM

## 2016-04-21 DIAGNOSIS — C50911 Malignant neoplasm of unspecified site of right female breast: Secondary | ICD-10-CM

## 2016-04-21 DIAGNOSIS — Z171 Estrogen receptor negative status [ER-]: Secondary | ICD-10-CM

## 2016-04-21 LAB — CBC WITH DIFFERENTIAL (CANCER CENTER ONLY)
BASO#: 0 10*3/uL (ref 0.0–0.2)
BASO%: 0.4 % (ref 0.0–2.0)
EOS%: 4.2 % (ref 0.0–7.0)
Eosinophils Absolute: 0.2 10*3/uL (ref 0.0–0.5)
HCT: 36.3 % (ref 34.8–46.6)
HGB: 12 g/dL (ref 11.6–15.9)
LYMPH#: 1.1 10*3/uL (ref 0.9–3.3)
LYMPH%: 23.6 % (ref 14.0–48.0)
MCH: 32.9 pg (ref 26.0–34.0)
MCHC: 33.1 g/dL (ref 32.0–36.0)
MCV: 100 fL (ref 81–101)
MONO#: 0.7 10*3/uL (ref 0.1–0.9)
MONO%: 15.2 % — AB (ref 0.0–13.0)
NEUT#: 2.7 10*3/uL (ref 1.5–6.5)
NEUT%: 56.6 % (ref 39.6–80.0)
PLATELETS: 238 10*3/uL (ref 145–400)
RBC: 3.65 10*6/uL — ABNORMAL LOW (ref 3.70–5.32)
RDW: 13.5 % (ref 11.1–15.7)
WBC: 4.8 10*3/uL (ref 3.9–10.0)

## 2016-04-21 LAB — CMP (CANCER CENTER ONLY)
ALBUMIN: 4.1 g/dL (ref 3.3–5.5)
ALK PHOS: 80 U/L (ref 26–84)
ALT: 27 U/L (ref 10–47)
AST: 32 U/L (ref 11–38)
BUN: 15 mg/dL (ref 7–22)
CO2: 24 mEq/L (ref 18–33)
CREATININE: 1.4 mg/dL — AB (ref 0.6–1.2)
Calcium: 10.2 mg/dL (ref 8.0–10.3)
Chloride: 111 mEq/L — ABNORMAL HIGH (ref 98–108)
Glucose, Bld: 84 mg/dL (ref 73–118)
POTASSIUM: 4.4 meq/L (ref 3.3–4.7)
Sodium: 146 mEq/L — ABNORMAL HIGH (ref 128–145)
TOTAL PROTEIN: 8.3 g/dL — AB (ref 6.4–8.1)
Total Bilirubin: 0.4 mg/dl (ref 0.20–1.60)

## 2016-04-21 MED ORDER — ZOLEDRONIC ACID 4 MG/100ML IV SOLN
4.0000 mg | Freq: Once | INTRAVENOUS | Status: AC
  Administered 2016-04-21: 4 mg via INTRAVENOUS
  Filled 2016-04-21: qty 100

## 2016-04-21 MED ORDER — SODIUM CHLORIDE 0.9% FLUSH
10.0000 mL | INTRAVENOUS | Status: DC | PRN
  Administered 2016-04-21: 10 mL via INTRAVENOUS
  Filled 2016-04-21: qty 10

## 2016-04-21 MED ORDER — HEPARIN SOD (PORK) LOCK FLUSH 100 UNIT/ML IV SOLN
500.0000 [IU] | Freq: Once | INTRAVENOUS | Status: AC
  Administered 2016-04-21: 500 [IU] via INTRAVENOUS
  Filled 2016-04-21: qty 5

## 2016-04-21 NOTE — Progress Notes (Signed)
Hematology and Oncology Follow Up Visit  [facility] [id number] Mar 27, 1946 70 y.o. 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     Interim History:  [name] is back for follow-up. She looks quite good. She does feel a little tired. She is still working. 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  She is taking radiation therapy. She is doing well with radiation. She has about 10 treatments to go.  She's had no problems with nausea or vomiting. She's had no change in bowel or bladder habits. She's had no leg swelling. She's had no rashes.  We need to remember that she does have a history of myeloma. 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 Her last myeloma studies done back in April did not show a monoclonal spike.    Her overall performance status is ECOG 1.  Medications:  Current Outpatient Prescriptions:  .  dexlansoprazole (DEXILANT) 60 MG capsule, Take 1 capsule (60 mg total) by mouth daily., Disp: 30 capsule, Rfl: 1 .  hyaluronate sodium (RADIAPLEXRX) GEL, Apply 1 application topically 2 (two) times daily., Disp: , Rfl:  .  lidocaine-prilocaine (EMLA) cream, Apply 1 application topically as needed (prior to accessing port)., Disp: , Rfl:  .  metFORMIN (GLUCOPHAGE) 500 MG tablet, Take 500 mg by mouth daily. , Disp: , Rfl: 1 .  non-metallic deodorant (ALRA) MISC, Apply 1 application topically daily as needed., Disp: , Rfl:  .  Vitamin D, Ergocalciferol, (DRISDOL) 50000 units CAPS capsule, Take 50,000 Units by mouth every 7 (seven) days. Pt takes on Wednesday., Disp: , Rfl:  .  zinc oxide (BALMEX) 11.3 % CREA cream, Apply 1 application topically as needed  (for irritation)., Disp: , Rfl:  .  ondansetron (ZOFRAN) 8 MG tablet, Take 8 mg by mouth 2 (two) times daily as needed for nausea or vomiting., Disp: , Rfl:  .  prochlorperazine (COMPAZINE) 10 MG tablet, Take 10 mg by mouth every 6 (six) hours as needed for nausea or vomiting., Disp: , Rfl:   Allergies: No Known Allergies  Past Medical History, Surgical history, Social history, and Family History were reviewed and updated.  Review of Systems: As above  Physical Exam:  weight is 178 lb (80.7 kg). Her oral temperature is 98.3 F (36.8 C). Her blood pressure is 178/86 (abnormal) and her pulse is 85. Her respiration is 20.   Wt Readings from Last 3 Encounters:  04/21/16 178 lb (80.7 kg)  04/18/16 176 lb 12.8 oz (80.2 kg)  04/11/16 177 lb 9.6 oz (80.6 kg)     Head and neck exam shows no ocular or oral lesions. There are no palpable cervical or supraclavicular lymph nodes. Lungs are clear bilaterally. Cardiac exam regular rate and rhythm with no murmurs, rubs or bruits. Breast exam shows left breast with no masses, edema or erythema. There is no left axillary adenopathy. Right breast shows a healing lumpectomy scar at about the 12:00 position. There is no mass.She has a little bit of erythema related to the radiation therapy. There is no right axillary adenopathy. Abdomen is soft. She has good bowel sounds. There is no fluid wave or there is no palpable  liver or spleen tip. Back exam shows no tenderness over the spine, ribs or hips. Extremities shows some mild swelling of the right arm. She has good range of motion of the right arm. She has good strength in her joints. Neurological exam shows no focal neurological deficits.  Lab Results  Component Value Date   WBC 4.8 04/21/2016   HGB 12.0 04/21/2016   HCT 36.3 04/21/2016   MCV 100 04/21/2016   PLT 238 04/21/2016     Chemistry      Component Value Date/Time   NA 146 (H) 04/21/2016 1018   NA 142 10/22/2015 0950   K 4.4 04/21/2016 1018     K 4.2 10/22/2015 0950   CL 111 (H) 04/21/2016 1018   CO2 24 04/21/2016 1018   CO2 24 10/22/2015 0950   BUN 15 04/21/2016 1018   BUN 14.0 10/22/2015 0950   CREATININE 1.4 (H) 04/21/2016 1018   CREATININE 1.6 (H) 10/22/2015 0950      Component Value Date/Time   CALCIUM 10.2 04/21/2016 1018   CALCIUM 9.4 10/22/2015 0950   ALKPHOS 80 04/21/2016 1018   ALKPHOS 79 10/22/2015 0950   AST 32 04/21/2016 1018   AST 35 (H) 10/22/2015 0950   ALT 27 04/21/2016 1018   ALT 41 10/22/2015 0950   BILITOT 0.40 04/21/2016 1018   BILITOT 0.30 10/22/2015 0950         Impression and Plan: Ms. Platek is a 70 year old African-American female with a stage Ia triple negative breast cancer of the right breast.She's completed her chemotherapy. She did well with this.She now is doing radiation. She has done well with this so far.  We will go ahead and give her Zometa today. She gets this every 6 months.  We will have to see what her myeloma studies show. Her total protein is up a little bit.  I would like to see her back in another 2 months. We will flush her Port-A-Cath we see her back.     [name], MD 12/1/201711:13 AM

## 2016-04-22 LAB — IGG, IGA, IGM
IGA/IMMUNOGLOBULIN A, SERUM: 290 mg/dL (ref 87–352)
IgM, Qn, Serum: 107 mg/dL (ref 26–217)

## 2016-04-24 ENCOUNTER — Encounter: Payer: Self-pay | Admitting: Hematology & Oncology

## 2016-04-24 ENCOUNTER — Ambulatory Visit
Admission: RE | Admit: 2016-04-24 | Discharge: 2016-04-24 | Disposition: A | Discharge status: Home / Self Care | Payer: Medicare Other | Source: Ambulatory Visit | Attending: Radiation Oncology | Admitting: Radiation Oncology

## 2016-04-24 DIAGNOSIS — Z171 Estrogen receptor negative status [ER-]: Secondary | ICD-10-CM | POA: Diagnosis not present

## 2016-04-24 DIAGNOSIS — Z510 Encounter for antineoplastic radiation therapy: Secondary | ICD-10-CM | POA: Diagnosis not present

## 2016-04-24 DIAGNOSIS — C50911 Malignant neoplasm of unspecified site of right female breast: Secondary | ICD-10-CM | POA: Diagnosis not present

## 2016-04-24 DIAGNOSIS — C50211 Malignant neoplasm of upper-inner quadrant of right female breast: Secondary | ICD-10-CM | POA: Diagnosis not present

## 2016-04-24 LAB — KAPPA/LAMBDA LIGHT CHAINS
IG KAPPA FREE LIGHT CHAIN: 50.9 mg/L — AB (ref 3.3–19.4)
IG LAMBDA FREE LIGHT CHAIN: 23.3 mg/L (ref 5.7–26.3)
KAPPA/LAMBDA FLC RATIO: 2.18 — AB (ref 0.26–1.65)

## 2016-04-25 ENCOUNTER — Encounter: Payer: Self-pay | Admitting: Radiation Oncology

## 2016-04-25 ENCOUNTER — Ambulatory Visit
Admission: RE | Admit: 2016-04-25 | Discharge: 2016-04-25 | Disposition: A | Discharge status: Home / Self Care | Payer: Medicare Other | Source: Ambulatory Visit | Attending: Radiation Oncology | Admitting: Radiation Oncology

## 2016-04-25 VITALS — BP 190/87 | HR 73 | Temp 97.8°F | Ht 64.0 in | Wt 179.4 lb

## 2016-04-25 DIAGNOSIS — L810 Postinflammatory hyperpigmentation: Secondary | ICD-10-CM

## 2016-04-25 DIAGNOSIS — Z510 Encounter for antineoplastic radiation therapy: Secondary | ICD-10-CM

## 2016-04-25 DIAGNOSIS — C50911 Malignant neoplasm of unspecified site of right female breast: Secondary | ICD-10-CM | POA: Insufficient documentation

## 2016-04-25 DIAGNOSIS — Z171 Estrogen receptor negative status [ER-]: Principal | ICD-10-CM

## 2016-04-25 DIAGNOSIS — C50211 Malignant neoplasm of upper-inner quadrant of right female breast: Secondary | ICD-10-CM | POA: Diagnosis not present

## 2016-04-25 LAB — PROTEIN ELECTROPHORESIS, SERUM, WITH REFLEX
A/G RATIO SPE: 0.9 (ref 0.7–1.7)
ALBUMIN: 3.6 g/dL (ref 2.9–4.4)
ALPHA 1: 0.3 g/dL (ref 0.0–0.4)
Alpha 2: 1 g/dL (ref 0.4–1.0)
BETA: 1.3 g/dL (ref 0.7–1.3)
Gamma Globulin: 1.6 g/dL (ref 0.4–1.8)
Globulin, Total: 4.2 g/dL — ABNORMAL HIGH (ref 2.2–3.9)
TOTAL PROTEIN: 7.8 g/dL (ref 6.0–8.5)

## 2016-04-25 MED ORDER — RADIAPLEXRX EX GEL
Freq: Once | CUTANEOUS | Status: AC
Start: 2016-04-25 — End: 2016-04-25
  Administered 2016-04-25: 10:00:00 via TOPICAL

## 2016-04-25 NOTE — Progress Notes (Signed)
  Radiation Oncology         [phone] ________________________________  Name: [name] MRN: [id number]  Date: 04/25/2016  DOB: [birth year]  Weekly Radiation Therapy Management    ICD-9-CM ICD-10-CM   1. Malignant neoplasm of right breast, stage 1, estrogen receptor negative (HCC) 174.9 C50.911 hyaluronate sodium (RADIAPLEXRX) gel   V86.1 Z17.1      Current Dose: 50.4 Gy     Planned Dose:  60.4 Gy  Narrative . . . . . . . . The patient presents for routine under treatment assessment.                                   The patient has completed 28 fractions to her right breast. She denies having pain. She reports fatigue. The patient is using radiaplex as directed. Nursing notes her BP was elevated today at 109/87.                                 Set-up films were reviewed.                                 The chart was checked. Physical Findings. . .  height is 5\' 4"  (1.626 m) and weight is 179 lb 6.4 oz (81.4 kg). Her oral temperature is 97.8 F (36.6 C). Her blood pressure is 190/87 (abnormal) and her pulse is 73. Her oxygen saturation is 100%.  Cardiopulmonary assessment is negative for acute distress and she exhibits normal effort. Slight hyperpigmentation changes in the right breast. Impression . . . . . . . The patient is tolerating radiation. Plan . . . . . . . . . . . . Continue treatment as planned.  ________________________________   Blair Promise, PhD, MD  This document serves as a record of services personally performed by Gery Pray, MD. It was created on his behalf by Maryla Morrow, a trained medical scribe. The creation of this record is based on the scribe's personal observations and the provider's statements to them. This document has been checked and approved by the attending provider.

## 2016-04-25 NOTE — Progress Notes (Signed)
[name] has completed 28 fractions to her right breast.  She denies having pain.  Her bp was elevated today at 190/87.  She reports having fatigue.  She is using radiaplex and has been given a refill.  The skin on her right breast has hyperpigmentation.  BP (!) 190/87 (BP Location: Left Arm, Patient Position: Sitting)   Pulse 73   Temp 97.8 F (36.6 C) (Oral)   Ht 5\' 4"  (1.626 m)   Wt 179 lb 6.4 oz (81.4 kg)   LMP 05/22/2000   SpO2 100%   BMI 30.79 kg/m    Wt Readings from Last 3 Encounters:  04/25/16 179 lb 6.4 oz (81.4 kg)  04/21/16 178 lb (80.7 kg)  04/18/16 176 lb 12.8 oz (80.2 kg)

## 2016-04-25 NOTE — Progress Notes (Signed)
  Radiation Oncology         ([phone] ________________________________  Name: [name] MRN: [id number]  Date: 04/25/2016  DOB: 12/18/1945  Electron beam Simulation Verification Note    ICD-9-CM ICD-10-CM   1. Malignant neoplasm of right breast, stage 1, estrogen receptor negative (HCC) 174.9 C50.911    V86.1 Z17.1     Status: outpatient  NARRATIVE: The patient was brought to the treatment unit and placed in the planned treatment position. The clinical setup was verified. Then port films were obtained and uploaded to the radiation oncology medical record software.  The treatment beams were carefully compared against the planned radiation fields. The position location and shape of the radiation fields was reviewed. They targeted volume of tissue appears to be appropriately covered by the radiation beams. Organs at risk appear to be excluded as planned.  Based on my personal review, I approved the simulation verification. The patient's treatment will proceed as planned.  -----------------------------------  Blair Promise, PhD, MD

## 2016-04-26 ENCOUNTER — Ambulatory Visit
Admission: RE | Admit: 2016-04-26 | Discharge: 2016-04-26 | Disposition: A | Discharge status: Home / Self Care | Payer: Medicare Other | Source: Ambulatory Visit | Attending: Radiation Oncology | Admitting: Radiation Oncology

## 2016-04-26 DIAGNOSIS — Z510 Encounter for antineoplastic radiation therapy: Secondary | ICD-10-CM | POA: Diagnosis not present

## 2016-04-26 DIAGNOSIS — C50911 Malignant neoplasm of unspecified site of right female breast: Secondary | ICD-10-CM | POA: Diagnosis not present

## 2016-04-26 DIAGNOSIS — Z171 Estrogen receptor negative status [ER-]: Secondary | ICD-10-CM | POA: Diagnosis not present

## 2016-04-27 ENCOUNTER — Ambulatory Visit
Admission: RE | Admit: 2016-04-27 | Discharge: 2016-04-27 | Disposition: A | Discharge status: Home / Self Care | Payer: Medicare Other | Source: Ambulatory Visit | Attending: Radiation Oncology | Admitting: Radiation Oncology

## 2016-04-27 DIAGNOSIS — Z510 Encounter for antineoplastic radiation therapy: Secondary | ICD-10-CM | POA: Diagnosis not present

## 2016-04-27 DIAGNOSIS — C50211 Malignant neoplasm of upper-inner quadrant of right female breast: Secondary | ICD-10-CM | POA: Diagnosis not present

## 2016-04-27 DIAGNOSIS — C50911 Malignant neoplasm of unspecified site of right female breast: Secondary | ICD-10-CM | POA: Diagnosis not present

## 2016-04-27 DIAGNOSIS — Z171 Estrogen receptor negative status [ER-]: Secondary | ICD-10-CM | POA: Diagnosis not present

## 2016-04-28 ENCOUNTER — Ambulatory Visit
Admission: RE | Admit: 2016-04-28 | Discharge: 2016-04-28 | Disposition: A | Discharge status: Home / Self Care | Payer: Medicare Other | Source: Ambulatory Visit | Attending: Radiation Oncology | Admitting: Radiation Oncology

## 2016-04-28 DIAGNOSIS — C50911 Malignant neoplasm of unspecified site of right female breast: Secondary | ICD-10-CM | POA: Diagnosis not present

## 2016-04-28 DIAGNOSIS — Z171 Estrogen receptor negative status [ER-]: Secondary | ICD-10-CM | POA: Diagnosis not present

## 2016-04-28 DIAGNOSIS — Z510 Encounter for antineoplastic radiation therapy: Secondary | ICD-10-CM | POA: Diagnosis not present

## 2016-05-01 ENCOUNTER — Ambulatory Visit
Admission: RE | Admit: 2016-05-01 | Discharge: 2016-05-01 | Disposition: A | Discharge status: Home / Self Care | Payer: Medicare Other | Source: Ambulatory Visit | Attending: Radiation Oncology | Admitting: Radiation Oncology

## 2016-05-01 DIAGNOSIS — C50911 Malignant neoplasm of unspecified site of right female breast: Secondary | ICD-10-CM | POA: Diagnosis not present

## 2016-05-01 DIAGNOSIS — Z171 Estrogen receptor negative status [ER-]: Secondary | ICD-10-CM | POA: Diagnosis not present

## 2016-05-01 DIAGNOSIS — Z510 Encounter for antineoplastic radiation therapy: Secondary | ICD-10-CM | POA: Diagnosis not present

## 2016-05-02 ENCOUNTER — Encounter: Payer: Self-pay | Admitting: Radiation Oncology

## 2016-05-02 ENCOUNTER — Ambulatory Visit
Admission: RE | Admit: 2016-05-02 | Discharge: 2016-05-02 | Disposition: A | Discharge status: Home / Self Care | Payer: Medicare Other | Source: Ambulatory Visit | Attending: Radiation Oncology | Admitting: Radiation Oncology

## 2016-05-02 VITALS — BP 188/95 | HR 83 | Temp 98.2°F | Resp 18 | Ht 64.0 in | Wt 177.8 lb

## 2016-05-02 DIAGNOSIS — Z171 Estrogen receptor negative status [ER-]: Principal | ICD-10-CM

## 2016-05-02 DIAGNOSIS — C50911 Malignant neoplasm of unspecified site of right female breast: Secondary | ICD-10-CM

## 2016-05-02 DIAGNOSIS — C50211 Malignant neoplasm of upper-inner quadrant of right female breast: Secondary | ICD-10-CM | POA: Diagnosis not present

## 2016-05-02 DIAGNOSIS — Z510 Encounter for antineoplastic radiation therapy: Secondary | ICD-10-CM | POA: Diagnosis not present

## 2016-05-02 NOTE — Progress Notes (Signed)
  Radiation Oncology         [phone] ________________________________  Name: [name] MRN: [id number]  Date: 05/02/2016  DOB: 02/17/1946  Weekly Radiation Therapy Management    ICD-9-CM ICD-10-CM   1. Malignant neoplasm of right breast, stage 1, estrogen receptor negative (HCC) 174.9 C50.911    V86.1 Z17.1      Current Dose: 60.4 Gy     Planned Dose:  60.4 Gy  Narrative . . . . . . . . The patient presents for routine under treatment assessment.                                   The patient has completed treatment with 33 fractions to her right breast. She denies having pain. Reports fatigue. Her BP was elevated today at 188/95.The patient is using radiaplex as directed. She reports a good appetite.                                  Set-up films were reviewed.                                 The chart was checked. Physical Findings. . .  height is 5\' 4"  (1.626 m) and weight is 177 lb 12.8 oz (80.6 kg). Her oral temperature is 98.2 F (36.8 C). Her blood pressure is 188/95 (abnormal) and her pulse is 83. Her respiration is 18 and oxygen saturation is 100%.  Cardiopulmonary assessment is negative for acute distress and she exhibits normal effort. Lungs clears to auscultation bilaterally. Hyperpigmentation changes in the right breast, no skin breakdown noted. Impression . . . . . . . The patient has tolerated radiation. Plan . . . . . . . . . . . . The patient has completed the planned course of treatment. She will return in one month for routine follow up. I encouraged the patient to contact me in the interim with any questions or concerns that may arise. ________________________________   Blair Promise, PhD, MD  This document serves as a record of services personally performed by Gery Pray, MD. It was created on his behalf by Maryla Morrow, a trained medical scribe. The creation of this record is based on the scribe's personal observations and the provider's statements  to them. This document has been checked and approved by the attending provider.

## 2016-05-02 NOTE — Progress Notes (Addendum)
[name] has completed 33 fractions to her right breast.  She denies having pain.  Her bp was elevated today at 188/95.  Asked to mention to mention to her PCP that her B/P has been elevated the last few times it was taken since stopping her B/P medication. She reports having fatigue.  She is using radiaplex and has been given a refill.  The skin on her right breast in tact  has hyperpigmentation.  Appetite is good.  EOT instructions given for skin care and one month follow up card given to see Dr. Sondra Come. Wt Readings from Last 3 Encounters:  05/02/16 177 lb 12.8 oz (80.6 kg)  04/25/16 179 lb 6.4 oz (81.4 kg)  04/21/16 178 lb (80.7 kg)  BP (!) 188/95   Pulse 83   Temp 98.2 F (36.8 C) (Oral)   Resp 18   Ht 5\' 4"  (1.626 m)   Wt 177 lb 12.8 oz (80.6 kg)   LMP 05/22/2000   SpO2 100%   BMI 30.52 kg/m

## 2016-05-02 NOTE — Progress Notes (Signed)
°  Radiation Oncology         [phone] ________________________________  Name: Basia Schlender MRN: [id number]  Date: 05/02/2016  DOB: 02-29-1946  End of Treatment Note  Diagnosis:   Malignant neoplasm of the right breast, Stage I     Indication for treatment:  Curative       Radiation treatment dates:   03/16/16 - 05/02/16  Site/dose:   Right breast treated to 50.4 Gy in 28 fractions of 1.8 Gy  Right breast boosted to 60.4 Gy in 5 fractions of 2 Gy  Beams/energy:   3D  //  10X, 6X  Boost : En Face  // 9MeV  Narrative: The patient tolerated radiation treatment relatively well. She experienced some fatigue, but consistently denied pain. Some itching I the treatment area but not skin breakdown. Hyperpigmentation changes. Plan: The patient has completed radiation treatment. The patient will return to radiation oncology clinic for routine followup in one month. I advised them to call or return sooner if they have any questions or concerns related to their recovery or treatment. She will follow up with her PCP regarding her BP concerns.  -----------------------------------  Blair Promise, PhD, MD  This document serves as a record of services personally performed by Gery Pray, MD. It was created on his behalf by Maryla Morrow, a trained medical scribe. The creation of this record is based on the scribe's personal observations and the provider's statements to them. This document has been checked and approved by the attending provider.

## 2016-05-18 ENCOUNTER — Encounter: Payer: Self-pay | Admitting: Radiation Oncology

## 2016-05-18 ENCOUNTER — Telehealth: Payer: Self-pay

## 2016-05-18 NOTE — Telephone Encounter (Addendum)
[name] called to request a letter regarding receiving dental care after having received radiation to her skull in 2008. Dr. Isidore Moos has written a letter after reviewing her treatment sites. [name] requests that we mail it to her, which will be done today. She knows to call back if she has any further questions or concerns.

## 2016-06-15 ENCOUNTER — Ambulatory Visit: Payer: Self-pay | Admitting: Radiation Oncology

## 2016-06-20 ENCOUNTER — Other Ambulatory Visit: Payer: Medicare Other

## 2016-06-20 ENCOUNTER — Ambulatory Visit: Payer: Medicare Other | Admitting: Hematology & Oncology

## 2016-06-21 ENCOUNTER — Encounter: Payer: Self-pay | Admitting: Oncology

## 2016-06-22 ENCOUNTER — Encounter: Payer: Self-pay | Admitting: Radiation Oncology

## 2016-06-22 ENCOUNTER — Ambulatory Visit
Admission: RE | Admit: 2016-06-22 | Discharge: 2016-06-22 | Disposition: A | Discharge status: Home / Self Care | Payer: Medicare Other | Source: Ambulatory Visit | Attending: Radiation Oncology | Admitting: Radiation Oncology

## 2016-06-22 DIAGNOSIS — C50911 Malignant neoplasm of unspecified site of right female breast: Secondary | ICD-10-CM | POA: Diagnosis not present

## 2016-06-22 DIAGNOSIS — L818 Other specified disorders of pigmentation: Secondary | ICD-10-CM | POA: Insufficient documentation

## 2016-06-22 DIAGNOSIS — Y842 Radiological procedure and radiotherapy as the cause of abnormal reaction of the patient, or of later complication, without mention of misadventure at the time of the procedure: Secondary | ICD-10-CM | POA: Diagnosis not present

## 2016-06-22 DIAGNOSIS — Z79899 Other long term (current) drug therapy: Secondary | ICD-10-CM | POA: Diagnosis not present

## 2016-06-22 DIAGNOSIS — Z171 Estrogen receptor negative status [ER-]: Secondary | ICD-10-CM | POA: Insufficient documentation

## 2016-06-22 DIAGNOSIS — Z7984 Long term (current) use of oral hypoglycemic drugs: Secondary | ICD-10-CM | POA: Diagnosis not present

## 2016-06-22 NOTE — Progress Notes (Signed)
Radiation Oncology         [phone] ________________________________  Name: [name] MRN: [id number]  Date: 06/22/2016  DOB: 1946-01-27  Follow-Up Visit Note  CC: Erin Reel, MD  Erin Baton, MD    ICD-9-CM ICD-10-CM   1. Malignant neoplasm of right breast, stage 1, estrogen receptor negative ([place]) 174.9 C50.911    V86.1 Z17.1     Diagnosis: Breast cancer stage IA (T1aN0M0) infiltrating duct carcinoma of the right breast, triple negative   Interval Since Last Radiation:  1 month 03/16/16-05/02/16 50.4 Gy to the right breast with 10 Gy boost  Narrative:  The patient returns today for routine follow-up. Patient denies pain or fatigue at this time. Per nursing, the skin on her right breast has hyperpigmentation.                            ALLERGIES:  has No Known Allergies.  Meds: Current Outpatient Prescriptions  Medication Sig Dispense Refill  . dexlansoprazole (DEXILANT) 60 MG capsule Take 1 capsule (60 mg total) by mouth daily. 30 capsule 1  . hyaluronate sodium (RADIAPLEXRX) GEL Apply 1 application topically 2 (two) times daily.    Marland Kitchen lidocaine-prilocaine (EMLA) cream Apply 1 application topically as needed (prior to accessing port).    . metFORMIN (GLUCOPHAGE) 500 MG tablet Take 500 mg by mouth daily.   1  . non-metallic deodorant (ALRA) MISC Apply 1 application topically daily as needed.    . Vitamin D, Ergocalciferol, (DRISDOL) 50000 units CAPS capsule Take 50,000 Units by mouth every 7 (seven) days. Pt takes on Wednesday.    . zinc oxide (BALMEX) 11.3 % CREA cream Apply 1 application topically as needed (for irritation).    . ondansetron (ZOFRAN) 8 MG tablet Take 8 mg by mouth 2 (two) times daily as needed for nausea or vomiting.    . prochlorperazine (COMPAZINE) 10 MG tablet Take 10 mg by mouth every 6 (six) hours as needed for nausea or vomiting.     No current facility-administered medications for this encounter.     Physical Findings: The patient is in no  acute distress. Patient is alert and oriented.  height is 5\' 4"  (1.626 m) and weight is 181 lb (82.1 kg). Her oral temperature is 97.7 F (36.5 C). Her blood pressure is 163/87 (abnormal) and her pulse is 85. Her oxygen saturation is 100%. .  No significant changes. Lungs are clear to auscultation bilaterally. Heart has regular rate and rhythm. No palpable cervical, supraclavicular, or axillary adenopathy. Abdomen soft, non-tender, normal bowel sounds. Left breast there is no palpable mass or nipple discharge. Right breast there is hyperpigmentation changes. No dominant mass, nipple discharge, or bleeding in the right breast.   Lab Findings: Lab Results  Component Value Date   WBC 4.8 04/21/2016   HGB 12.0 04/21/2016   HCT 36.3 04/21/2016   MCV 100 04/21/2016   PLT 238 04/21/2016    Radiographic Findings: No results found.  Impression:  The patient is recovering from the effects of radiation. No evidence of recurrence on clinical exam. Patient is doing well since her adjuvant radiation therapy.  Plan:  Follow up in radiation oncology in 6 months.  ____________________________________    This document serves as a record of services personally performed by Gery Pray, MD. It was created on his behalf by Bethann Humble, a trained medical scribe. The creation of this record is based on the scribe's personal observations and the  provider's statements to them. This document has been checked and approved by the attending provider.  

## 2016-06-22 NOTE — Progress Notes (Signed)
[name] is here for follow up.  She denies having any pain or fatigue.  The skin on her right breast has hyperpigmentation.  BP (!) 163/87 (BP Location: Left Arm, Patient Position: Sitting)   Pulse 85   Temp 97.7 F (36.5 C) (Oral)   Ht 5\' 4"  (1.626 m)   Wt 181 lb (82.1 kg)   LMP 05/22/2000   SpO2 100%   BMI 31.07 kg/m    Wt Readings from Last 3 Encounters:  06/22/16 181 lb (82.1 kg)  05/02/16 177 lb 12.8 oz (80.6 kg)  04/25/16 179 lb 6.4 oz (81.4 kg)

## 2016-06-28 ENCOUNTER — Ambulatory Visit: Payer: Medicare Other

## 2016-06-28 ENCOUNTER — Ambulatory Visit (HOSPITAL_BASED_OUTPATIENT_CLINIC_OR_DEPARTMENT_OTHER): Payer: Medicare Other | Admitting: Family

## 2016-06-28 ENCOUNTER — Other Ambulatory Visit (HOSPITAL_BASED_OUTPATIENT_CLINIC_OR_DEPARTMENT_OTHER): Payer: Medicare Other

## 2016-06-28 VITALS — BP 158/68 | HR 79 | Temp 97.8°F | Resp 16 | Wt 182.0 lb

## 2016-06-28 DIAGNOSIS — C9000 Multiple myeloma not having achieved remission: Secondary | ICD-10-CM

## 2016-06-28 DIAGNOSIS — C50911 Malignant neoplasm of unspecified site of right female breast: Secondary | ICD-10-CM

## 2016-06-28 DIAGNOSIS — C9001 Multiple myeloma in remission: Secondary | ICD-10-CM | POA: Diagnosis not present

## 2016-06-28 DIAGNOSIS — Z853 Personal history of malignant neoplasm of breast: Secondary | ICD-10-CM | POA: Diagnosis not present

## 2016-06-28 DIAGNOSIS — Z171 Estrogen receptor negative status [ER-]: Principal | ICD-10-CM

## 2016-06-28 DIAGNOSIS — M899 Disorder of bone, unspecified: Secondary | ICD-10-CM

## 2016-06-28 LAB — CBC WITH DIFFERENTIAL (CANCER CENTER ONLY)
BASO#: 0 10*3/uL (ref 0.0–0.2)
BASO%: 0.4 % (ref 0.0–2.0)
EOS ABS: 0.2 10*3/uL (ref 0.0–0.5)
EOS%: 3.5 % (ref 0.0–7.0)
HCT: 37.3 % (ref 34.8–46.6)
HGB: 12.2 g/dL (ref 11.6–15.9)
LYMPH#: 1.3 10*3/uL (ref 0.9–3.3)
LYMPH%: 27.1 % (ref 14.0–48.0)
MCH: 31.6 pg (ref 26.0–34.0)
MCHC: 32.7 g/dL (ref 32.0–36.0)
MCV: 97 fL (ref 81–101)
MONO#: 0.7 10*3/uL (ref 0.1–0.9)
MONO%: 15.6 % — AB (ref 0.0–13.0)
NEUT#: 2.5 10*3/uL (ref 1.5–6.5)
NEUT%: 53.4 % (ref 39.6–80.0)
PLATELETS: 236 10*3/uL (ref 145–400)
RBC: 3.86 10*6/uL (ref 3.70–5.32)
RDW: 14.1 % (ref 11.1–15.7)
WBC: 4.6 10*3/uL (ref 3.9–10.0)

## 2016-06-28 LAB — CMP (CANCER CENTER ONLY)
ALBUMIN: 3.6 g/dL (ref 3.3–5.5)
ALT(SGPT): 35 U/L (ref 10–47)
AST: 31 U/L (ref 11–38)
Alkaline Phosphatase: 67 U/L (ref 26–84)
BUN, Bld: 13 mg/dL (ref 7–22)
CHLORIDE: 105 meq/L (ref 98–108)
CO2: 27 meq/L (ref 18–33)
CREATININE: 1.4 mg/dL — AB (ref 0.6–1.2)
Calcium: 9.2 mg/dL (ref 8.0–10.3)
Glucose, Bld: 80 mg/dL (ref 73–118)
POTASSIUM: 4 meq/L (ref 3.3–4.7)
Sodium: 145 mEq/L (ref 128–145)
Total Bilirubin: 0.6 mg/dl (ref 0.20–1.60)
Total Protein: 7.3 g/dL (ref 6.4–8.1)

## 2016-06-28 MED ORDER — SODIUM CHLORIDE 0.9% FLUSH
10.0000 mL | INTRAVENOUS | Status: DC | PRN
  Administered 2016-06-28: 10 mL via INTRAVENOUS
  Filled 2016-06-28: qty 10

## 2016-06-28 MED ORDER — HEPARIN SOD (PORK) LOCK FLUSH 100 UNIT/ML IV SOLN
500.0000 [IU] | Freq: Once | INTRAVENOUS | Status: AC
  Administered 2016-06-28: 500 [IU] via INTRAVENOUS
  Filled 2016-06-28: qty 5

## 2016-06-28 NOTE — Progress Notes (Signed)
Hematology and Oncology Follow Up Visit  [place] [id number] 10/24/1945 71 y.o. 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 She is doing well and staying busy with work. She enjoys driving the school bus. She is still walking for exercise.  She completed chemo in August of last year and 30 fractions of radiation to the right breast in December. Her radiation hyperpigmentation is fading nicely.  No changes with the chest. No lymphadenopathy found on exam.  So far, her myeloma studies have been stable. Total protein was mildly elevated at 4.2 in December. Results for today's labs are pending.  No fever, chills, n/v, cough, rash, dizziness, SOB, chest pain, palpitations, abdominal pain or changes in bowel or bladder habits.  No swelling, tenderness, numbness or tingling in her extremities. No new aches or c/o bone pain.  She has maintained a good appetite and is staying well hydrated. Her weight is stable.   Medications:  Allergies as of 06/28/2016   No Known Allergies     Medication List       Accurate as of 06/28/16 10:03 AM. Always use your most recent med list.          dexlansoprazole 60 MG capsule Commonly known as:  DEXILANT Take 1 capsule (60 mg total) by mouth daily.   lidocaine-prilocaine cream Commonly known as:  EMLA Apply 1 application topically as needed (prior to accessing port).   metFORMIN 500 MG tablet Commonly known as:  GLUCOPHAGE Take 500 mg by mouth daily.   non-metallic deodorant Misc Commonly known as:  ALRA Apply 1 application topically daily as needed.   ondansetron 8 MG tablet Commonly known  as:  ZOFRAN Take 8 mg by mouth 2 (two) times daily as needed for nausea or vomiting.   prochlorperazine 10 MG tablet Commonly known as:  COMPAZINE Take 10 mg by mouth every 6 (six) hours as needed for nausea or vomiting.   Vitamin D (Ergocalciferol) 50000 units Caps capsule Commonly known as:  DRISDOL Take 50,000 Units by mouth every 7 (seven) days. Pt takes on Wednesday.   zinc oxide 11.3 % Crea cream Commonly known as:  BALMEX Apply 1 application topically as needed (for irritation).       Allergies: No Known Allergies  Past Medical History, Surgical history, Social history, and Family History were reviewed and updated.  Review of Systems: All other 10 point review of systems is negative.   Physical Exam:  vitals were not taken for this visit.  Wt Readings from Last 3 Encounters:  06/28/16 182 lb 0.6 oz (82.6 kg)  06/22/16 181 lb (82.1 kg)  05/02/16 177 lb 12.8 oz (80.6 kg)    Ocular: Sclerae unicteric, pupils equal, round and reactive to light Ear-nose-throat: Oropharynx clear, dentition fair Lymphatic: No cervical supraclavicular or axillary adenopathy Lungs no rales or rhonchi, good excursion bilaterally Heart regular rate and rhythm, no murmur appreciated Abd soft, nontender, positive bowel sounds, no liver or spleen tip palpated on exam MSK no focal spinal tenderness, no joint edema Neuro: non-focal, well-oriented, appropriate affect Breasts: No mass, lesion, rash of lymphadenopathy found on exam  Lab Results  Component Value Date  WBC 4.8 04/21/2016   HGB 12.0 04/21/2016   HCT 36.3 04/21/2016   MCV 100 04/21/2016   PLT 238 04/21/2016   Lab Results  Component Value Date   FERRITIN 2,980 (H) 12/01/2015   IRON 26 (L) 12/01/2015   TIBC 143 (L) 12/01/2015   UIBC 117 12/01/2015   IRONPCTSAT 18 12/01/2015   Lab Results  Component Value Date   RETICCTPCT <0.4 (L) 12/01/2015   RBC 3.65 (L) 04/21/2016   Lab Results  Component Value Date   KPAFRELGTCHN  3.35 (H) 03/08/2015   LAMBDASER 1.98 03/08/2015   KAPLAMBRATIO 2.18 (H) 04/21/2016   Lab Results  Component Value Date   IGGSERUM 1,495 04/21/2016   IGA 433 (H) 03/08/2015   IGMSERUM 107 04/21/2016   Lab Results  Component Value Date   TOTALPROTELP 7.8 03/08/2015   ALBUMINELP 4.2 03/08/2015   A1GS 0.4 (H) 03/08/2015   A2GS 0.9 03/08/2015   BETS 0.5 03/08/2015   BETA2SER 0.5 03/08/2015   GAMS 1.4 03/08/2015   MSPIKE Not Observed 04/21/2016   SPEI * 03/08/2015     Chemistry      Component Value Date/Time   NA 146 (H) 04/21/2016 1018   NA 142 10/22/2015 0950   K 4.4 04/21/2016 1018   K 4.2 10/22/2015 0950   CL 111 (H) 04/21/2016 1018   CO2 24 04/21/2016 1018   CO2 24 10/22/2015 0950   BUN 15 04/21/2016 1018   BUN 14.0 10/22/2015 0950   CREATININE 1.4 (H) 04/21/2016 1018   CREATININE 1.6 (H) 10/22/2015 0950      Component Value Date/Time   CALCIUM 10.2 04/21/2016 1018   CALCIUM 9.4 10/22/2015 0950   ALKPHOS 80 04/21/2016 1018   ALKPHOS 79 10/22/2015 0950   AST 32 04/21/2016 1018   AST 35 (H) 10/22/2015 0950   ALT 27 04/21/2016 1018   ALT 41 10/22/2015 0950   BILITOT 0.40 04/21/2016 1018   BILITOT 0.30 10/22/2015 0950     Impression and Plan: [name] is a 71 yo African American female with IgG Kappa myeloma - remission s/p ASCT and also stage Ia triple negative breast cancer of the right breast. She has completed chemo and radiation and continues to do quite well. She is asymptomatic and has no complaints at this time.  We will see what her myeloma studies show. So far, they have been stable. Total protein was mildly elevated in December. Today's results are pending.  She will be due again for Zometa in June 2018.  We will plan to see her back in 2 months for repeat lab work, follow-up and port flush.  She will contact our office with any questions or concerns. We can certainly see her sooner if need be.   Eliezer Bottom, NP 2/7/201810:03 AM

## 2016-06-28 NOTE — Patient Instructions (Signed)

## 2016-06-29 LAB — KAPPA/LAMBDA LIGHT CHAINS
IG KAPPA FREE LIGHT CHAIN: 38.8 mg/L — AB (ref 3.3–19.4)
Ig Lambda Free Light Chain: 20.6 mg/L (ref 5.7–26.3)
Kappa/Lambda FluidC Ratio: 1.88 — ABNORMAL HIGH (ref 0.26–1.65)

## 2016-06-30 LAB — MULTIPLE MYELOMA PANEL, SERUM
ALBUMIN/GLOB SERPL: 1 (ref 0.7–1.7)
ALPHA 1: 0.2 g/dL (ref 0.0–0.4)
Albumin SerPl Elph-Mcnc: 3.5 g/dL (ref 2.9–4.4)
Alpha2 Glob SerPl Elph-Mcnc: 0.9 g/dL (ref 0.4–1.0)
B-Globulin SerPl Elph-Mcnc: 1.2 g/dL (ref 0.7–1.3)
GAMMA GLOB SERPL ELPH-MCNC: 1.3 g/dL (ref 0.4–1.8)
GLOBULIN, TOTAL: 3.6 g/dL (ref 2.2–3.9)
IGA/IMMUNOGLOBULIN A, SERUM: 267 mg/dL (ref 87–352)
IgG, Qn, Serum: 1230 mg/dL (ref 700–1600)
IgM, Qn, Serum: 103 mg/dL (ref 26–217)
Total Protein: 7.1 g/dL (ref 6.0–8.5)

## 2016-08-10 DIAGNOSIS — N390 Urinary tract infection, site not specified: Secondary | ICD-10-CM | POA: Diagnosis not present

## 2016-08-10 DIAGNOSIS — N183 Chronic kidney disease, stage 3 (moderate): Secondary | ICD-10-CM | POA: Diagnosis not present

## 2016-08-10 DIAGNOSIS — E784 Other hyperlipidemia: Secondary | ICD-10-CM | POA: Diagnosis not present

## 2016-08-10 DIAGNOSIS — E1122 Type 2 diabetes mellitus with diabetic chronic kidney disease: Secondary | ICD-10-CM | POA: Diagnosis not present

## 2016-08-10 DIAGNOSIS — E559 Vitamin D deficiency, unspecified: Secondary | ICD-10-CM | POA: Diagnosis not present

## 2016-08-10 DIAGNOSIS — R8299 Other abnormal findings in urine: Secondary | ICD-10-CM | POA: Diagnosis not present

## 2016-08-17 DIAGNOSIS — Z6834 Body mass index (BMI) 34.0-34.9, adult: Secondary | ICD-10-CM | POA: Diagnosis not present

## 2016-08-17 DIAGNOSIS — C50011 Malignant neoplasm of nipple and areola, right female breast: Secondary | ICD-10-CM | POA: Diagnosis not present

## 2016-08-17 DIAGNOSIS — D649 Anemia, unspecified: Secondary | ICD-10-CM | POA: Diagnosis not present

## 2016-08-17 DIAGNOSIS — E668 Other obesity: Secondary | ICD-10-CM | POA: Diagnosis not present

## 2016-08-17 DIAGNOSIS — E1122 Type 2 diabetes mellitus with diabetic chronic kidney disease: Secondary | ICD-10-CM | POA: Diagnosis not present

## 2016-08-17 DIAGNOSIS — N183 Chronic kidney disease, stage 3 (moderate): Secondary | ICD-10-CM | POA: Diagnosis not present

## 2016-08-17 DIAGNOSIS — Z1389 Encounter for screening for other disorder: Secondary | ICD-10-CM | POA: Diagnosis not present

## 2016-08-17 DIAGNOSIS — Z0000 Encounter for general adult medical examination without abnormal findings: Secondary | ICD-10-CM | POA: Diagnosis not present

## 2016-08-17 DIAGNOSIS — M179 Osteoarthritis of knee, unspecified: Secondary | ICD-10-CM | POA: Diagnosis not present

## 2016-08-17 DIAGNOSIS — E784 Other hyperlipidemia: Secondary | ICD-10-CM | POA: Diagnosis not present

## 2016-08-17 DIAGNOSIS — I129 Hypertensive chronic kidney disease with stage 1 through stage 4 chronic kidney disease, or unspecified chronic kidney disease: Secondary | ICD-10-CM | POA: Diagnosis not present

## 2016-08-17 DIAGNOSIS — C9000 Multiple myeloma not having achieved remission: Secondary | ICD-10-CM | POA: Diagnosis not present

## 2016-08-29 ENCOUNTER — Other Ambulatory Visit (HOSPITAL_BASED_OUTPATIENT_CLINIC_OR_DEPARTMENT_OTHER): Payer: Medicare Other

## 2016-08-29 ENCOUNTER — Ambulatory Visit (HOSPITAL_BASED_OUTPATIENT_CLINIC_OR_DEPARTMENT_OTHER): Payer: Medicare Other | Admitting: Hematology & Oncology

## 2016-08-29 ENCOUNTER — Ambulatory Visit (HOSPITAL_BASED_OUTPATIENT_CLINIC_OR_DEPARTMENT_OTHER): Payer: Medicare Other

## 2016-08-29 DIAGNOSIS — C50911 Malignant neoplasm of unspecified site of right female breast: Secondary | ICD-10-CM | POA: Diagnosis not present

## 2016-08-29 DIAGNOSIS — Z171 Estrogen receptor negative status [ER-]: Principal | ICD-10-CM

## 2016-08-29 DIAGNOSIS — Z452 Encounter for adjustment and management of vascular access device: Secondary | ICD-10-CM | POA: Diagnosis not present

## 2016-08-29 DIAGNOSIS — M899 Disorder of bone, unspecified: Secondary | ICD-10-CM | POA: Diagnosis not present

## 2016-08-29 DIAGNOSIS — C9001 Multiple myeloma in remission: Secondary | ICD-10-CM | POA: Diagnosis not present

## 2016-08-29 DIAGNOSIS — Z853 Personal history of malignant neoplasm of breast: Secondary | ICD-10-CM

## 2016-08-29 DIAGNOSIS — M898X9 Other specified disorders of bone, unspecified site: Secondary | ICD-10-CM

## 2016-08-29 LAB — CMP (CANCER CENTER ONLY)
ALT(SGPT): 29 U/L (ref 10–47)
AST: 27 U/L (ref 11–38)
Albumin: 3.7 g/dL (ref 3.3–5.5)
Alkaline Phosphatase: 71 U/L (ref 26–84)
BILIRUBIN TOTAL: 0.5 mg/dL (ref 0.20–1.60)
BUN: 15 mg/dL (ref 7–22)
CHLORIDE: 107 meq/L (ref 98–108)
CO2: 28 meq/L (ref 18–33)
Calcium: 9.5 mg/dL (ref 8.0–10.3)
Creat: 1.5 mg/dl — ABNORMAL HIGH (ref 0.6–1.2)
GLUCOSE: 92 mg/dL (ref 73–118)
Potassium: 4.4 mEq/L (ref 3.3–4.7)
Sodium: 145 mEq/L (ref 128–145)
Total Protein: 7.2 g/dL (ref 6.4–8.1)

## 2016-08-29 LAB — CBC WITH DIFFERENTIAL (CANCER CENTER ONLY)
BASO#: 0 10*3/uL (ref 0.0–0.2)
BASO%: 0.5 % (ref 0.0–2.0)
EOS ABS: 0.1 10*3/uL (ref 0.0–0.5)
EOS%: 3 % (ref 0.0–7.0)
HCT: 37.7 % (ref 34.8–46.6)
HEMOGLOBIN: 12.3 g/dL (ref 11.6–15.9)
LYMPH#: 1.4 10*3/uL (ref 0.9–3.3)
LYMPH%: 32.7 % (ref 14.0–48.0)
MCH: 32.4 pg (ref 26.0–34.0)
MCHC: 32.6 g/dL (ref 32.0–36.0)
MCV: 99 fL (ref 81–101)
MONO#: 0.6 10*3/uL (ref 0.1–0.9)
MONO%: 13.6 % — AB (ref 0.0–13.0)
NEUT%: 50.2 % (ref 39.6–80.0)
NEUTROS ABS: 2.2 10*3/uL (ref 1.5–6.5)
Platelets: 246 10*3/uL (ref 145–400)
RBC: 3.8 10*6/uL (ref 3.70–5.32)
RDW: 13.8 % (ref 11.1–15.7)
WBC: 4.3 10*3/uL (ref 3.9–10.0)

## 2016-08-29 MED ORDER — LIDOCAINE-PRILOCAINE 2.5-2.5 % EX CREA
TOPICAL_CREAM | CUTANEOUS | Status: AC
  Filled 2016-08-29: qty 30

## 2016-08-29 MED ORDER — SODIUM CHLORIDE 0.9% FLUSH
10.0000 mL | INTRAVENOUS | Status: DC | PRN
  Administered 2016-08-29: 10 mL via INTRAVENOUS
  Filled 2016-08-29: qty 10

## 2016-08-29 MED ORDER — HEPARIN SOD (PORK) LOCK FLUSH 100 UNIT/ML IV SOLN
500.0000 [IU] | Freq: Once | INTRAVENOUS | Status: AC
Start: 2016-08-29 — End: 2016-08-29
  Administered 2016-08-29: 500 [IU] via INTRAVENOUS
  Filled 2016-08-29: qty 5

## 2016-08-29 NOTE — Progress Notes (Signed)
Hematology and Oncology Follow Up Visit  Donnelly [id number] Dec 07, 1945 71 y.o. 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    Interim History:  Ms. Kyllonen is here today for follow-up. She is doing well and staying busy with work. She enjoys driving the school bus. She is still walking for exercise.  She completed chemo in August of last year and 30 fractions of radiation to the right breast in December. Her radiation hyperpigmentation is fading nicely.  No changes with the chest. No lymphadenopathy found on exam.  So far, her myeloma studies have been stable. Total protein was mildly elevated at 4.2 in December. Results for today's labs are pending.  No fever, chills, n/v, cough, rash, dizziness, SOB, chest pain, palpitations, abdominal pain or changes in bowel or bladder habits.  No swelling, tenderness, numbness or tingling in her extremities. No new aches or c/o bone pain.  She has maintained a good appetite and is staying well hydrated. Her weight is stable.   Medications:  Allergies as of 08/29/2016   No Known Allergies     Medication List       Accurate as of 08/29/16 11:46 AM. Always use your most recent med list.          dexlansoprazole 60 MG capsule Commonly known as:  DEXILANT Take 1 capsule (60 mg total) by mouth daily.   lidocaine-prilocaine cream Commonly known as:  EMLA Apply 1 application topically as needed (prior to accessing port).   metFORMIN 500 MG tablet Commonly known as:  GLUCOPHAGE Take 500 mg by mouth daily.   non-metallic deodorant Misc Commonly  known as:  ALRA Apply 1 application topically daily as needed.   ondansetron 8 MG tablet Commonly known as:  ZOFRAN Take 8 mg by mouth 2 (two) times daily as needed for nausea or vomiting.   prochlorperazine 10 MG tablet Commonly known as:  COMPAZINE Take 10 mg by mouth every 6 (six) hours as needed for nausea or vomiting.   Vitamin D (Ergocalciferol) 50000 units Caps capsule Commonly known as:  DRISDOL Take 50,000 Units by mouth every 7 (seven) days. Pt takes on Wednesday.   zinc oxide 11.3 % Crea cream Commonly known as:  BALMEX Apply 1 application topically as needed (for irritation).       Allergies: No Known Allergies  Past Medical History, Surgical history, Social history, and Family History were reviewed and updated.  Review of Systems: All other 10 point review of systems is negative.   Physical Exam:  vitals were not taken for this visit.  Wt Readings from Last 3 Encounters:  06/28/16 182 lb 0.6 oz (82.6 kg)  06/22/16 181 lb (82.1 kg)  05/02/16 177 lb 12.8 oz (80.6 kg)    Well-developed and well-nourished African-American female. Head and neck exam shows no ocular or oral lesions. There are no palpable cervical or supraclavicular lymph nodes. Lungs are clear. Cardiac exam regular rate and rhythm with no murmurs, rubs or bruits. Abdomen is soft. She has good bowel sounds. There is no fluid wave. There is no palpable liver or spleen tip. Breast exam shows left breast  with no masses, edema or erythema. There is no left axillary adenopathy. Right breast shows well-healed lumpectomy at the 2:00 position. She has some hyperpigmentation from radiation. There is some slight firmness at the lumpectomy site. No distinct masses noted in the right breast. There is no right axillary adenopathy. Back exam shows no tenderness over the spine, ribs or hips. Extremities shows no clubbing, cyanosis or edema. Neurological exam shows no focal neurological deficits. Lab Results  Component  Value Date   WBC 4.3 08/29/2016   HGB 12.3 08/29/2016   HCT 37.7 08/29/2016   MCV 99 08/29/2016   PLT 246 08/29/2016   Lab Results  Component Value Date   FERRITIN 2,980 (H) 12/01/2015   IRON 26 (L) 12/01/2015   TIBC 143 (L) 12/01/2015   UIBC 117 12/01/2015   IRONPCTSAT 18 12/01/2015   Lab Results  Component Value Date   RETICCTPCT <0.4 (L) 12/01/2015   RBC 3.80 08/29/2016   Lab Results  Component Value Date   KPAFRELGTCHN 3.35 (H) 03/08/2015   LAMBDASER 1.98 03/08/2015   KAPLAMBRATIO 1.88 (H) 06/28/2016   Lab Results  Component Value Date   IGGSERUM 1,230 06/28/2016   IGA 433 (H) 03/08/2015   IGMSERUM 103 06/28/2016   Lab Results  Component Value Date   TOTALPROTELP 7.8 03/08/2015   ALBUMINELP 4.2 03/08/2015   A1GS 0.4 (H) 03/08/2015   A2GS 0.9 03/08/2015   BETS 0.5 03/08/2015   BETA2SER 0.5 03/08/2015   GAMS 1.4 03/08/2015   MSPIKE Not Observed 04/21/2016   SPEI * 03/08/2015     Chemistry      Component Value Date/Time   NA 145 08/29/2016 1045   NA 142 10/22/2015 0950   K 4.4 08/29/2016 1045   K 4.2 10/22/2015 0950   CL 107 08/29/2016 1045   CO2 28 08/29/2016 1045   CO2 24 10/22/2015 0950   BUN 15 08/29/2016 1045   BUN 14.0 10/22/2015 0950   CREATININE 1.5 (H) 08/29/2016 1045   CREATININE 1.6 (H) 10/22/2015 0950      Component Value Date/Time   CALCIUM 9.5 08/29/2016 1045   CALCIUM 9.4 10/22/2015 0950   ALKPHOS 71 08/29/2016 1045   ALKPHOS 79 10/22/2015 0950   AST 27 08/29/2016 1045   AST 35 (H) 10/22/2015 0950   ALT 29 08/29/2016 1045   ALT 41 10/22/2015 0950   BILITOT 0.50 08/29/2016 1045   BILITOT 0.30 10/22/2015 0950     Impression and Plan: Ms. Vanlue is a 71 yo African American female with IgG Kappa myeloma - remission s/p ASCT and also stage Ia triple negative breast cancer of the right breast. She has completed chemo and radiation and continues to do quite well. She is asymptomatic and has no complaints at this time.   We will plan  to get her back to see [place] in 3 months. We see her back, we will give her Zometa. We probably will give her Zometa for a total of 2 years.  Her myeloma does not seem to be an issue. It has been 12 years since she had her transplant. She had the stem cell transplant for her myeloma in October 2006.  She will be going to [place] for a little bit of vacation this summer.    [name], MD 4/10/201811:46 AM

## 2016-08-29 NOTE — Patient Instructions (Signed)
Implanted Port Insertion, Care After °This sheet gives you information about how to care for yourself after your procedure. Your health care provider may also give you more specific instructions. If you have problems or questions, contact your health care provider. °What can I expect after the procedure? °After your procedure, it is common to have: °· Discomfort at the port insertion site. °· Bruising on the skin over the port. This should improve over 3-4 days. ° °Follow these instructions at home: °Port care °· After your port is placed, you will get a manufacturer's information card. The card has information about your port. Keep this card with you at all times. °· Take care of the port as told by your health care provider. Ask your health care provider if you or a family member can get training for taking care of the port at home. A home health care nurse may also take care of the port. °· Make sure to remember what type of port you have. °Incision care °· Follow instructions from your health care provider about how to take care of your port insertion site. Make sure you: °? Wash your hands with soap and water before you change your bandage (dressing). If soap and water are not available, use hand sanitizer. °? Change your dressing as told by your health care provider. °? Leave stitches (sutures), skin glue, or adhesive strips in place. These skin closures may need to stay in place for 2 weeks or longer. If adhesive strip edges start to loosen and curl up, you may trim the loose edges. Do not remove adhesive strips completely unless your health care provider tells you to do that. °· Check your port insertion site every day for signs of infection. Check for: °? More redness, swelling, or pain. °? More fluid or blood. °? Warmth. °? Pus or a bad smell. °General instructions °· Do not take baths, swim, or use a hot tub until your health care provider approves. °· Do not lift anything that is heavier than 10 lb (4.5  kg) for a week, or as told by your health care provider. °· Ask your health care provider when it is okay to: °? Return to work or school. °? Resume usual physical activities or sports. °· Do not drive for 24 hours if you were given a medicine to help you relax (sedative). °· Take over-the-counter and prescription medicines only as told by your health care provider. °· Wear a medical alert bracelet in case of an emergency. This will tell any health care providers that you have a port. °· Keep all follow-up visits as told by your health care provider. This is important. °Contact a health care provider if: °· You cannot flush your port with saline as directed, or you cannot draw blood from the port. °· You have a fever or chills. °· You have more redness, swelling, or pain around your port insertion site. °· You have more fluid or blood coming from your port insertion site. °· Your port insertion site feels warm to the touch. °· You have pus or a bad smell coming from the port insertion site. °Get help right away if: °· You have chest pain or shortness of breath. °· You have bleeding from your port that you cannot control. °Summary °· Take care of the port as told by your health care provider. °· Change your dressing as told by your health care provider. °· Keep all follow-up visits as told by your health care provider. °  This information is not intended to replace advice given to you by your health care provider. Make sure you discuss any questions you have with your health care provider. °Document Released: 02/26/2013 Document Revised: 03/29/2016 Document Reviewed: 03/29/2016 °Elsevier Interactive Patient Education © 2017 Elsevier Inc. ° °

## 2016-08-30 LAB — KAPPA/LAMBDA LIGHT CHAINS
Ig Kappa Free Light Chain: 26.6 mg/L — ABNORMAL HIGH (ref 3.3–19.4)
Ig Lambda Free Light Chain: 16.6 mg/L (ref 5.7–26.3)
Kappa/Lambda FluidC Ratio: 1.6 (ref 0.26–1.65)

## 2016-08-31 LAB — MULTIPLE MYELOMA PANEL, SERUM
ALBUMIN SERPL ELPH-MCNC: 3.3 g/dL (ref 2.9–4.4)
ALPHA2 GLOB SERPL ELPH-MCNC: 0.8 g/dL (ref 0.4–1.0)
Albumin/Glob SerPl: 1.1 (ref 0.7–1.7)
Alpha 1: 0.2 g/dL (ref 0.0–0.4)
B-Globulin SerPl Elph-Mcnc: 1.1 g/dL (ref 0.7–1.3)
Gamma Glob SerPl Elph-Mcnc: 1.2 g/dL (ref 0.4–1.8)
Globulin, Total: 3.3 g/dL (ref 2.2–3.9)
IGM (IMMUNOGLOBIN M), SRM: 99 mg/dL (ref 26–217)
IgA, Qn, Serum: 268 mg/dL (ref 87–352)
TOTAL PROTEIN: 6.6 g/dL (ref 6.0–8.5)

## 2016-09-01 ENCOUNTER — Telehealth: Payer: Self-pay | Admitting: *Deleted

## 2016-09-01 NOTE — Telephone Encounter (Addendum)
Patient is aware of results  ----- Message from [name], MD sent at 08/31/2016  5:05 PM EDT ----- Call - NO myeloma detected!!!  pete

## 2016-10-31 IMAGING — MR MR BREAST BILAT WO/W CM
8 of 12 series · 33 of 48 positions shown · IV contrast (8 ML Multihance)
Comparison: Previous exam(s).

CLINICAL DATA: Patient presents for bilateral breast MRI due to
recent diagnosis of invasive ductal carcinoma with DCIS found on
stereotactic core biopsy of a 4 mm mass in the upper inner quadrant
right breast.

LABS:  BUN/ creatinine 20/1.7 with GFR 36. Labs at [REDACTED]
EXAM:
BILATERAL BREAST MRI WITH AND WITHOUT CONTRAST
TECHNIQUE: Multiplanar, multisequence MR images of both breasts were obtained
prior to and following the intravenous administration of 8 ml of
MultiHance.

[Series 2: t2_tirm_tra ipat (a-p) · axial · 3.0mm · 0.70mm/px · 1 of 55 slices shown]
[im 1/55]
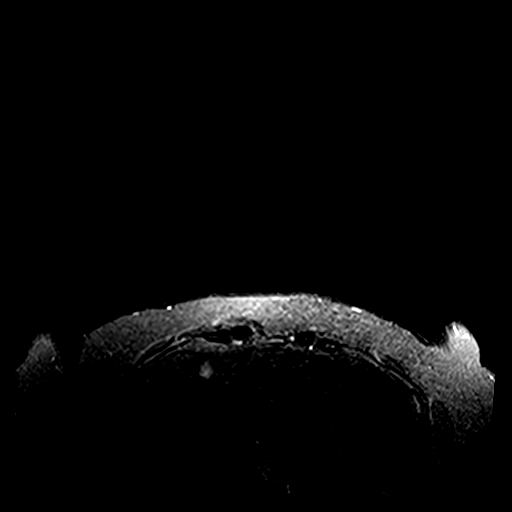

[Series 3: fl3d pre-cm no · axial · non-contrast · 1.2mm · 0.94mm/px · z∈[-72,+100]mm · 5 of 144 slices shown]
[im 1/144]
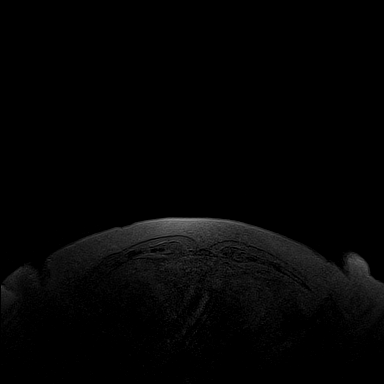
[im 36/144]
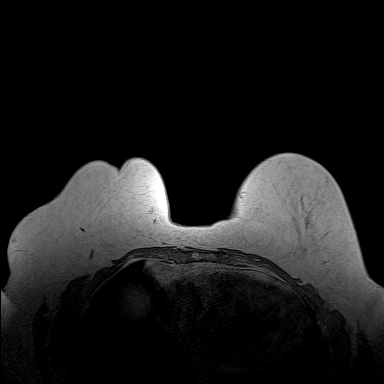
[im 72/144]
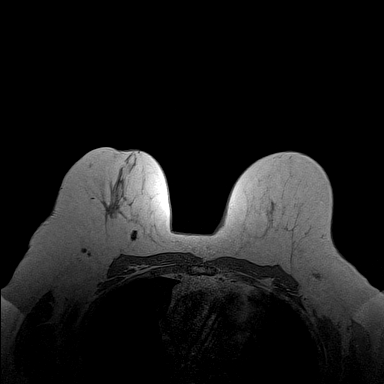
[im 108/144]
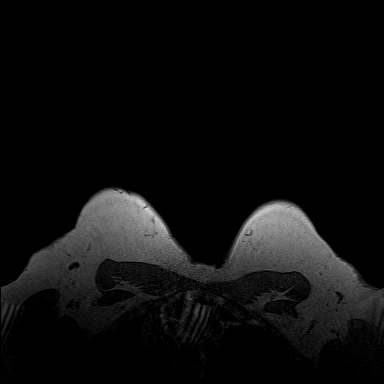
[im 144/144]
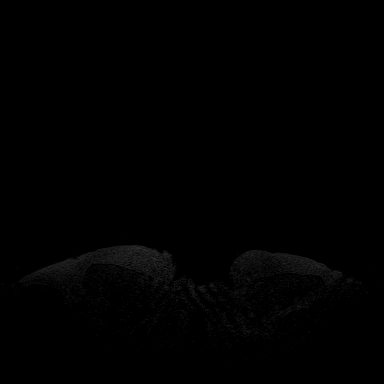

[Series 4: fl3d pre-cm · axial · non-contrast · 1.2mm · 0.94mm/px · z∈[-72,+100]mm · 5 of 144 slices shown]
[im 1/144]
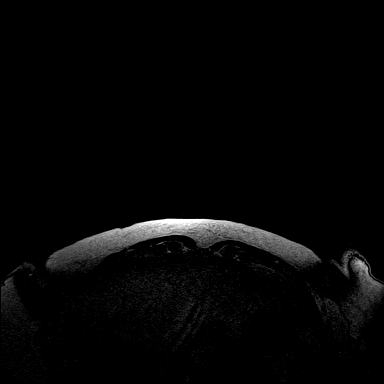
[im 36/144]
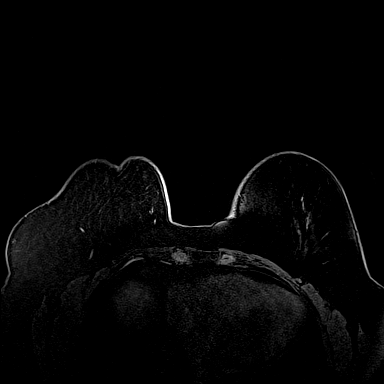
[im 72/144]
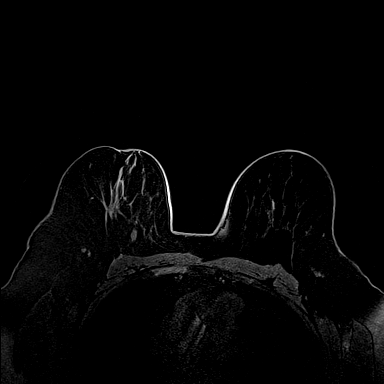
[im 108/144]
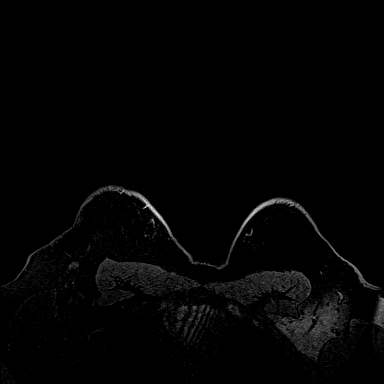
[im 144/144]
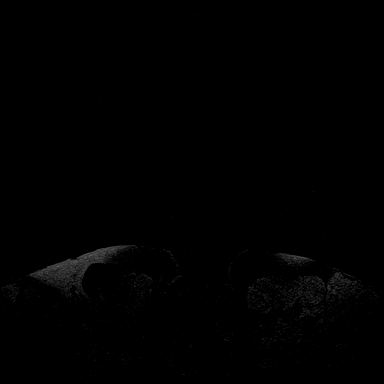

[Series 5: fl3d post-cm 20 · axial · 1.2mm · 0.94mm/px · z∈[-72,+100]mm · 5 of 144 slices shown (1 of 3)]
[im 1/144]
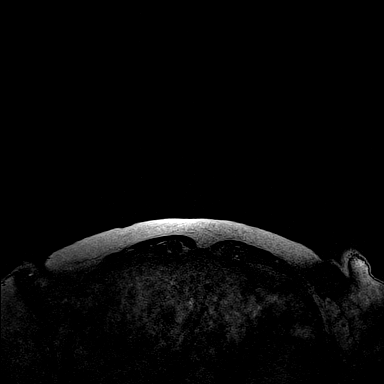
[im 36/144]
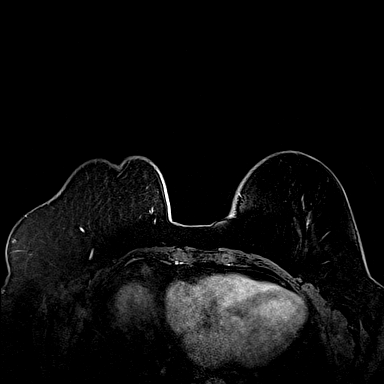
[im 72/144]
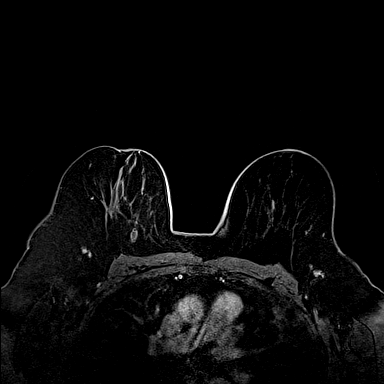
[im 108/144]
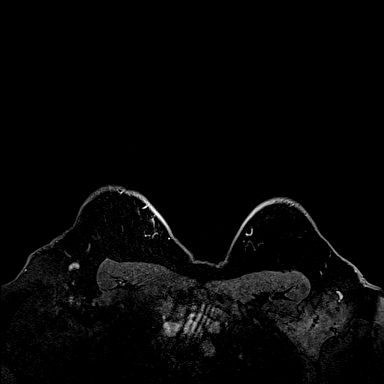
[im 144/144]
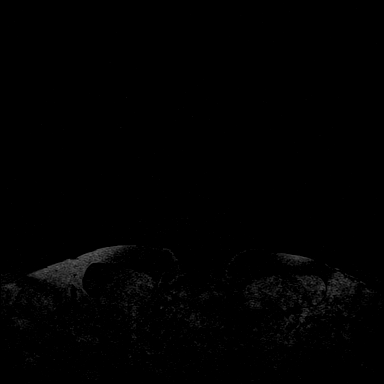

[Series 6: fl3d post-cm 20 · axial · 1.2mm · 0.94mm/px · z∈[-72,+100]mm · 5 of 144 slices shown (2 of 3)]
[im 1/144]
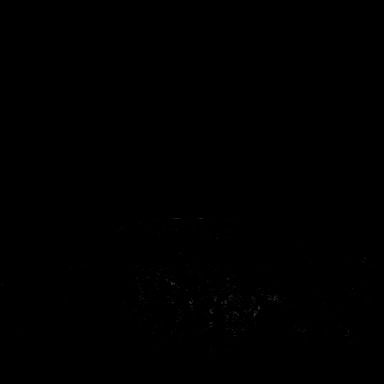
[im 36/144]
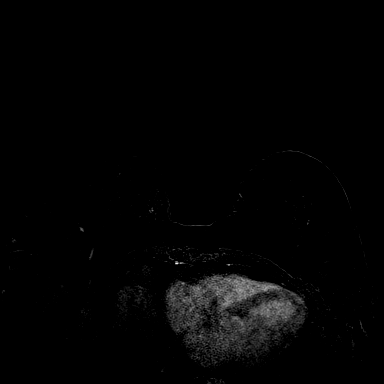
[im 72/144]
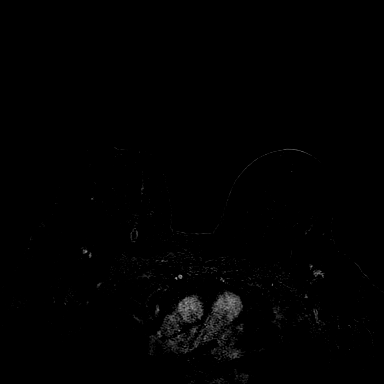
[im 108/144]
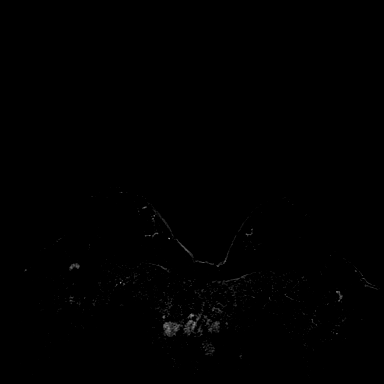
[im 144/144]
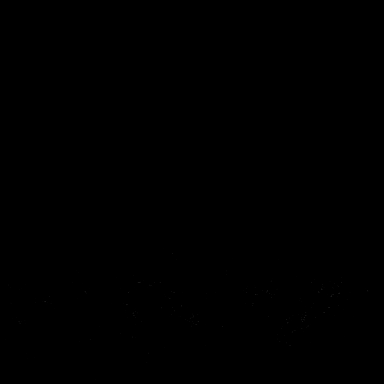

[Series 7: fl3d post-cm 20 · axial · 172.8mm · 0.94mm/px · 1 of 1 slices shown (3 of 3)]
[im 1/1]
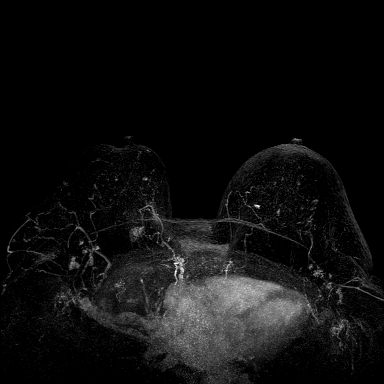

[Series 8: fl3d post-cm 3min · axial · 1.2mm · 0.94mm/px · z∈[-72,+100]mm · 6 of 144 slices shown]
[im 1/144]
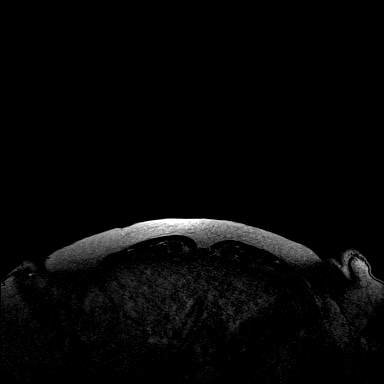
[im 29/144]
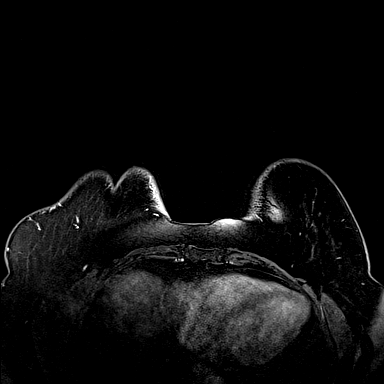
[im 58/144]
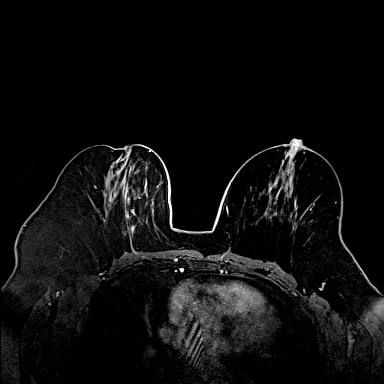
[im 86/144]
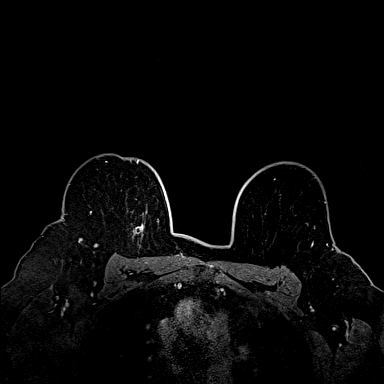
[im 115/144]
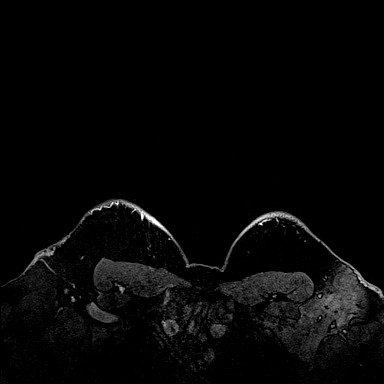
[im 144/144]
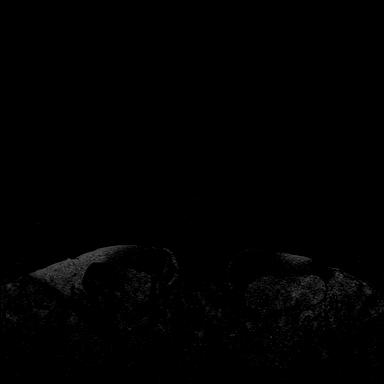

[Series 9: fl3d post-cm 3min_sub · axial · 1.2mm · 0.94mm/px · z∈[-72,+65]mm · 5 of 144 slices shown]
[im 1/144]
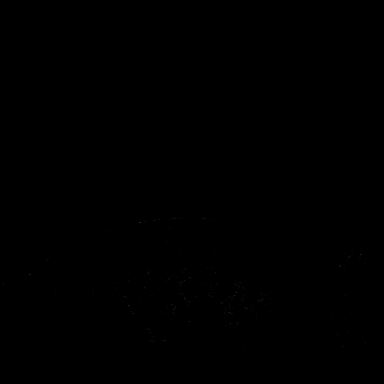
[im 29/144]
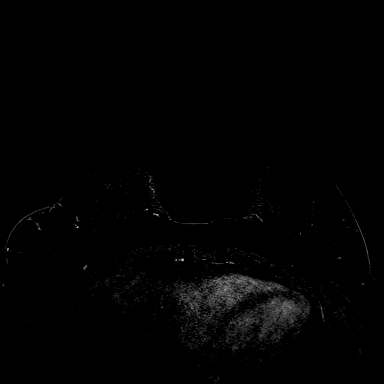
[im 58/144]
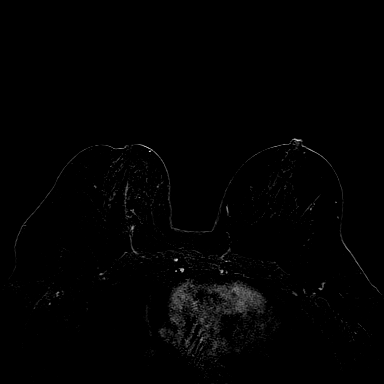
[im 86/144]
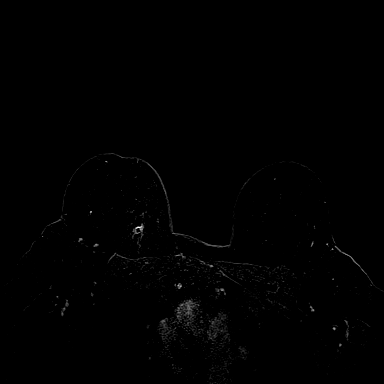
[im 115/144]
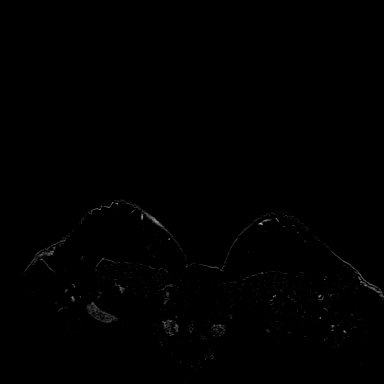

[33 of 48 positions shown; findings below may reference images not displayed]

THREE-DIMENSIONAL MR IMAGE RENDERING ON INDEPENDENT WORKSTATION:

Three-dimensional MR images were rendered by post-processing of the
original MR data on an independent workstation. The
three-dimensional MR images were interpreted, and findings are
reported in the following complete MRI report for this study. Three
dimensional images were evaluated at the independent [employer]
workstation
FINDINGS: Breast composition: b. Scattered fibroglandular tissue.

Background parenchymal enhancement: Mild.

Right breast: There is metallic clip artifact and hematoma at the
site of patient's known 4 mm biopsy-proven malignancy in the upper
inner quadrant. No additional suspicious findings within the right
breast.

Left breast: No suspicious mass or abnormal enhancement.

Lymph nodes: No abnormal appearing lymph nodes.

Ancillary findings:  1.6 cm cyst over the left lobe of the liver.
IMPRESSION: Biopsy-proven 4 mm malignancy over the upper inner quadrant of the
right breast with associated clip artifact and post biopsy hematoma.

RECOMMENDATION:
Recommend continued management per clinical treatment plan.

BI-RADS CATEGORY  6: Known biopsy-proven malignancy.

## 2016-11-28 ENCOUNTER — Ambulatory Visit (HOSPITAL_BASED_OUTPATIENT_CLINIC_OR_DEPARTMENT_OTHER): Payer: Medicare Other

## 2016-11-28 ENCOUNTER — Other Ambulatory Visit (HOSPITAL_BASED_OUTPATIENT_CLINIC_OR_DEPARTMENT_OTHER): Payer: Medicare Other

## 2016-11-28 ENCOUNTER — Ambulatory Visit: Payer: Medicare Other

## 2016-11-28 ENCOUNTER — Ambulatory Visit (HOSPITAL_BASED_OUTPATIENT_CLINIC_OR_DEPARTMENT_OTHER): Payer: Medicare Other | Admitting: Hematology & Oncology

## 2016-11-28 ENCOUNTER — Telehealth: Payer: Self-pay | Admitting: *Deleted

## 2016-11-28 DIAGNOSIS — Z171 Estrogen receptor negative status [ER-]: Secondary | ICD-10-CM

## 2016-11-28 DIAGNOSIS — Z923 Personal history of irradiation: Secondary | ICD-10-CM | POA: Diagnosis not present

## 2016-11-28 DIAGNOSIS — M818 Other osteoporosis without current pathological fracture: Secondary | ICD-10-CM

## 2016-11-28 DIAGNOSIS — Z9221 Personal history of antineoplastic chemotherapy: Secondary | ICD-10-CM

## 2016-11-28 DIAGNOSIS — I10 Essential (primary) hypertension: Secondary | ICD-10-CM

## 2016-11-28 DIAGNOSIS — C50911 Malignant neoplasm of unspecified site of right female breast: Secondary | ICD-10-CM

## 2016-11-28 DIAGNOSIS — C9001 Multiple myeloma in remission: Secondary | ICD-10-CM

## 2016-11-28 DIAGNOSIS — M899 Disorder of bone, unspecified: Secondary | ICD-10-CM

## 2016-11-28 LAB — CBC WITH DIFFERENTIAL (CANCER CENTER ONLY)
BASO#: 0 10*3/uL (ref 0.0–0.2)
BASO%: 0.4 % (ref 0.0–2.0)
EOS%: 2.7 % (ref 0.0–7.0)
Eosinophils Absolute: 0.1 10*3/uL (ref 0.0–0.5)
HCT: 37.3 % (ref 34.8–46.6)
HGB: 12.3 g/dL (ref 11.6–15.9)
LYMPH#: 1.6 10*3/uL (ref 0.9–3.3)
LYMPH%: 30.7 % (ref 14.0–48.0)
MCH: 32.5 pg (ref 26.0–34.0)
MCHC: 33 g/dL (ref 32.0–36.0)
MCV: 99 fL (ref 81–101)
MONO#: 0.6 10*3/uL (ref 0.1–0.9)
MONO%: 12.5 % (ref 0.0–13.0)
NEUT#: 2.8 10*3/uL (ref 1.5–6.5)
NEUT%: 53.7 % (ref 39.6–80.0)
PLATELETS: 254 10*3/uL (ref 145–400)
RBC: 3.78 10*6/uL (ref 3.70–5.32)
RDW: 13.6 % (ref 11.1–15.7)
WBC: 5.1 10*3/uL (ref 3.9–10.0)

## 2016-11-28 LAB — CMP (CANCER CENTER ONLY)
ALK PHOS: 89 U/L — AB (ref 26–84)
ALT: 40 U/L (ref 10–47)
AST: 25 U/L (ref 11–38)
Albumin: 3.6 g/dL (ref 3.3–5.5)
BUN: 12 mg/dL (ref 7–22)
CO2: 26 mEq/L (ref 18–33)
CREATININE: 1.6 mg/dL — AB (ref 0.6–1.2)
Calcium: 9.6 mg/dL (ref 8.0–10.3)
Chloride: 106 mEq/L (ref 98–108)
GLUCOSE: 130 mg/dL — AB (ref 73–118)
POTASSIUM: 4 meq/L (ref 3.3–4.7)
SODIUM: 141 meq/L (ref 128–145)
Total Bilirubin: 0.5 mg/dl (ref 0.20–1.60)
Total Protein: 7.5 g/dL (ref 6.4–8.1)

## 2016-11-28 MED ORDER — ZOLEDRONIC ACID 4 MG/5ML IV CONC
3.3000 mg | Freq: Once | INTRAVENOUS | Status: AC
  Administered 2016-11-28: 3.3 mg via INTRAVENOUS
  Filled 2016-11-28: qty 4.13

## 2016-11-28 MED ORDER — AMLODIPINE BESYLATE 5 MG PO TABS: 5.0000 mg | ORAL_TABLET | Freq: Every day | ORAL | 6 refills | Status: DC

## 2016-11-28 MED ORDER — AMLODIPINE BESY-BENAZEPRIL HCL 5-20 MG PO CAPS: 1.0000 | ORAL_CAPSULE | Freq: Every day | ORAL | 3 refills | Status: DC

## 2016-11-28 MED ORDER — LISINOPRIL 20 MG PO TABS: 20.0000 mg | ORAL_TABLET | Freq: Every day | ORAL | 6 refills | Status: DC

## 2016-11-28 NOTE — Patient Instructions (Signed)

## 2016-11-28 NOTE — Patient Instructions (Signed)
Implanted Port Home Guide An implanted port is a type of central line that is placed under the skin. Central lines are used to provide IV access when treatment or nutrition needs to be given through a person's veins. Implanted ports are used for long-term IV access. An implanted port may be placed because:  You need IV medicine that would be irritating to the small veins in your hands or arms.  You need long-term IV medicines, such as antibiotics.  You need IV nutrition for a long period.  You need frequent blood draws for lab tests.  You need dialysis.  Implanted ports are usually placed in the chest area, but they can also be placed in the upper arm, the abdomen, or the leg. An implanted port has two main parts:  Reservoir. The reservoir is round and will appear as a small, raised area under your skin. The reservoir is the part where a needle is inserted to give medicines or draw blood.  Catheter. The catheter is a thin, flexible tube that extends from the reservoir. The catheter is placed into a large vein. Medicine that is inserted into the reservoir goes into the catheter and then into the vein.  How will I care for my incision site? Do not get the incision site wet. Bathe or shower as directed by your health care provider. How is my port accessed? Special steps must be taken to access the port:  Before the port is accessed, a numbing cream can be placed on the skin. This helps numb the skin over the port site.  Your health care provider uses a sterile technique to access the port. ? Your health care provider must put on a mask and sterile gloves. ? The skin over your port is cleaned carefully with an antiseptic and allowed to dry. ? The port is gently pinched between sterile gloves, and a needle is inserted into the port.  Only "non-coring" port needles should be used to access the port. Once the port is accessed, a blood return should be checked. This helps ensure that the port  is in the vein and is not clogged.  If your port needs to remain accessed for a constant infusion, a clear (transparent) bandage will be placed over the needle site. The bandage and needle will need to be changed every week, or as directed by your health care provider.  Keep the bandage covering the needle clean and dry. Do not get it wet. Follow your health care provider's instructions on how to take a shower or bath while the port is accessed.  If your port does not need to stay accessed, no bandage is needed over the port.  What is flushing? Flushing helps keep the port from getting clogged. Follow your health care provider's instructions on how and when to flush the port. Ports are usually flushed with saline solution or a medicine called heparin. The need for flushing will depend on how the port is used.  If the port is used for intermittent medicines or blood draws, the port will need to be flushed: ? After medicines have been given. ? After blood has been drawn. ? As part of routine maintenance.  If a constant infusion is running, the port may not need to be flushed.  How long will my port stay implanted? The port can stay in for as long as your health care provider thinks it is needed. When it is time for the port to come out, surgery will be   done to remove it. The procedure is similar to the one performed when the port was put in. When should I seek immediate medical care? When you have an implanted port, you should seek immediate medical care if:  You notice a bad smell coming from the incision site.  You have swelling, redness, or drainage at the incision site.  You have more swelling or pain at the port site or the surrounding area.  You have a fever that is not controlled with medicine.  This information is not intended to replace advice given to you by your health care provider. Make sure you discuss any questions you have with your health care provider. Document  Released: 05/08/2005 Document Revised: 10/14/2015 Document Reviewed: 01/13/2013 Elsevier Interactive Patient Education  2017 Elsevier Inc.  

## 2016-11-28 NOTE — Progress Notes (Signed)
Hematology and Oncology Follow Up Visit  [facility] [id number] 06/25/1945 71 y.o. 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    Interim History:  [name] is here today for follow-up. She's had a good summer. He is very busy summer for her. She just had her family reunion. There was have a good time at her family reunion.  Her boyfriend and she saw Honey. They had a very good business this past weekend down at the [facility]. With the weather being so nice, a lot of people bought their honey.  She is going to go to [place] to visit a daughter in August. 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  She is had no issues with respect to recurrent breast cancer. There's been no issues with myeloma recurrence. Her last myeloma studies done 4 months ago were all normal. There is no monoclonal spike noted.  She is trying to exercise.  Overall, her performance status is ECOG 0.  Medications:  Allergies as of 11/28/2016   No Known Allergies     Medication List       Accurate as of 11/28/16  9:59 AM. Always use your most recent med list.          dexlansoprazole 60 MG capsule Commonly known as:  DEXILANT Take 1 capsule (60 mg total) by mouth daily.   lidocaine-prilocaine cream Commonly known as:  EMLA Apply 1 application topically as needed (prior to accessing port).   metFORMIN 500 MG tablet Commonly known as:  GLUCOPHAGE Take 500 mg by mouth daily.   non-metallic deodorant Misc Commonly known as:  ALRA Apply 1 application  topically daily as needed.   ondansetron 8 MG tablet Commonly known as:  ZOFRAN Take 8 mg by mouth 2 (two) times daily as needed for nausea or vomiting.   prochlorperazine 10 MG tablet Commonly known as:  COMPAZINE Take 10 mg by mouth every 6 (six) hours as needed for nausea or vomiting.   Vitamin D (Ergocalciferol) 50000 units Caps capsule Commonly known as:  DRISDOL Take 50,000 Units by mouth every 7 (seven) days. Pt takes on Wednesday.   zinc oxide 11.3 % Crea cream Commonly known as:  BALMEX Apply 1 application topically as needed (for irritation).       Allergies: No Known Allergies  Past Medical History, Surgical history, Social history, and Family History were reviewed and updated.  Review of Systems: All other 10 point review of systems is negative.   Physical Exam:  vitals were not taken for this visit.  Wt Readings from Last 3 Encounters:  11/28/16 190 lb (86.2 kg)  06/28/16 182 lb 0.6 oz (82.6 kg)  06/22/16 181 lb (82.1 kg)    Well-developed and well-nourished African-American female. Head and neck exam shows no ocular or oral lesions. There are no palpable cervical or supraclavicular lymph nodes. Lungs are clear. Cardiac exam regular rate and rhythm with no murmurs, rubs or bruits. Abdomen is soft. She has good bowel sounds. There is no fluid wave. There is no palpable  liver or spleen tip. Breast exam shows left breast with no masses, edema or erythema. There is no left axillary adenopathy. Right breast shows well-healed lumpectomy at the 2:00 position. She has some hyperpigmentation from radiation. There is some slight firmness at the lumpectomy site. No distinct masses noted in the right breast. There is no right axillary adenopathy. Back exam shows no tenderness over the spine, ribs or hips. Extremities shows no clubbing, cyanosis or edema. Neurological exam shows no focal neurological deficits. Lab Results  Component Value Date   WBC 5.1 11/28/2016   HGB 12.3  11/28/2016   HCT 37.3 11/28/2016   MCV 99 11/28/2016   PLT 254 11/28/2016   Lab Results  Component Value Date   FERRITIN 2,980 (H) 12/01/2015   IRON 26 (L) 12/01/2015   TIBC 143 (L) 12/01/2015   UIBC 117 12/01/2015   IRONPCTSAT 18 12/01/2015   Lab Results  Component Value Date   RETICCTPCT <0.4 (L) 12/01/2015   RBC 3.78 11/28/2016   Lab Results  Component Value Date   KPAFRELGTCHN 3.35 (H) 03/08/2015   LAMBDASER 1.98 03/08/2015   KAPLAMBRATIO 1.60 08/29/2016   Lab Results  Component Value Date   IGGSERUM 1,174 08/29/2016   IGA 433 (H) 03/08/2015   IGMSERUM 99 08/29/2016   Lab Results  Component Value Date   TOTALPROTELP 7.8 03/08/2015   ALBUMINELP 4.2 03/08/2015   A1GS 0.4 (H) 03/08/2015   A2GS 0.9 03/08/2015   BETS 0.5 03/08/2015   BETA2SER 0.5 03/08/2015   GAMS 1.4 03/08/2015   MSPIKE Not Observed 04/21/2016   SPEI * 03/08/2015     Chemistry      Component Value Date/Time   NA 141 11/28/2016 0908   NA 142 10/22/2015 0950   K 4.0 11/28/2016 0908   K 4.2 10/22/2015 0950   CL 106 11/28/2016 0908   CO2 26 11/28/2016 0908   CO2 24 10/22/2015 0950   BUN 12 11/28/2016 0908   BUN 14.0 10/22/2015 0950   CREATININE 1.6 (H) 11/28/2016 0908   CREATININE 1.6 (H) 10/22/2015 0950      Component Value Date/Time   CALCIUM 9.6 11/28/2016 0908   CALCIUM 9.4 10/22/2015 0950   ALKPHOS 89 (H) 11/28/2016 0908   ALKPHOS 79 10/22/2015 0950   AST 25 11/28/2016 0908   AST 35 (H) 10/22/2015 0950   ALT 40 11/28/2016 0908   ALT 41 10/22/2015 0950   BILITOT 0.50 11/28/2016 0908   BILITOT 0.30 10/22/2015 0950     Impression and Plan: [name] is a 71 yo African American female with IgG Kappa myeloma - remission s/p ASCT and also stage Ia triple negative breast cancer of the right breast. She has completed chemo and radiation and continues to do quite well. She is asymptomatic and has no complaints at this time.   I'm worried about her renal function. Her blood pressure  is not under good control. We really need to do some about this. As such, we really need to get her on something to help with her blood pressure. I will start her on Lotrel (5/20). Other she will see her family doctor in another couple months.  Her breast cancer looks fantastic. I'll see any issue with recurrent breast cancer.  We will decrease her dose of Zometa. This is because of her renal function.  I will plan to get her back in 3 more months. Hopefully, we will see her blood pressure under better control.  [name], MD 7/10/20189:59 AM

## 2016-11-28 NOTE — Telephone Encounter (Signed)
Patient cannot afford the copay for the new LOTREL prescription. Juliann Pulse, the pharmacist, suggested two prescriptions amlodipine 5mg  with lisinopril 20mg  daily.   Reviewed with Dr Marin Olp and he's okay with the changes. Verbal order given.

## 2016-11-29 LAB — KAPPA/LAMBDA LIGHT CHAINS
Ig Kappa Free Light Chain: 28.6 mg/L — ABNORMAL HIGH (ref 3.3–19.4)
Ig Lambda Free Light Chain: 17.7 mg/L (ref 5.7–26.3)
Kappa/Lambda FluidC Ratio: 1.62 (ref 0.26–1.65)

## 2016-11-29 LAB — IGG, IGA, IGM
IGA/IMMUNOGLOBULIN A, SERUM: 286 mg/dL (ref 87–352)
IGM (IMMUNOGLOBIN M), SRM: 106 mg/dL (ref 26–217)

## 2016-11-30 ENCOUNTER — Encounter: Payer: Self-pay | Admitting: Radiation Oncology

## 2016-11-30 ENCOUNTER — Ambulatory Visit
Admission: RE | Admit: 2016-11-30 | Discharge: 2016-11-30 | Disposition: A | Discharge status: Home / Self Care | Payer: Medicare Other | Source: Ambulatory Visit | Attending: Radiation Oncology | Admitting: Radiation Oncology

## 2016-11-30 DIAGNOSIS — Z7984 Long term (current) use of oral hypoglycemic drugs: Secondary | ICD-10-CM | POA: Diagnosis not present

## 2016-11-30 DIAGNOSIS — Z853 Personal history of malignant neoplasm of breast: Secondary | ICD-10-CM | POA: Diagnosis not present

## 2016-11-30 DIAGNOSIS — C50911 Malignant neoplasm of unspecified site of right female breast: Secondary | ICD-10-CM | POA: Diagnosis present

## 2016-11-30 DIAGNOSIS — Z9889 Other specified postprocedural states: Secondary | ICD-10-CM | POA: Insufficient documentation

## 2016-11-30 DIAGNOSIS — Z171 Estrogen receptor negative status [ER-]: Secondary | ICD-10-CM

## 2016-11-30 DIAGNOSIS — Z08 Encounter for follow-up examination after completed treatment for malignant neoplasm: Secondary | ICD-10-CM | POA: Diagnosis not present

## 2016-11-30 LAB — PROTEIN ELECTROPHORESIS, SERUM, WITH REFLEX
A/G Ratio: 1 (ref 0.7–1.7)
ALPHA 1: 0.2 g/dL (ref 0.0–0.4)
Albumin: 3.6 g/dL (ref 2.9–4.4)
Alpha 2: 0.9 g/dL (ref 0.4–1.0)
Beta: 1.3 g/dL (ref 0.7–1.3)
GAMMA GLOBULIN: 1.1 g/dL (ref 0.4–1.8)
GLOBULIN, TOTAL: 3.5 g/dL (ref 2.2–3.9)
TOTAL PROTEIN: 7.1 g/dL (ref 6.0–8.5)

## 2016-11-30 NOTE — Progress Notes (Signed)
Radiation Oncology         [phone] ________________________________  Name: [name] MRN: [id number]  Date: 11/30/2016  DOB: 07-01-1945  Follow-Up Visit Note  CC: [name], MD  Excell Seltzer, MD    ICD-10-CM   1. 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  She denies having pain and fatigue.  She will have a mammogram July 21 or 27.  No arm swelling or nipple discharge or bleeding.                          ALLERGIES:  has No Known Allergies.  Meds: Current Outpatient Prescriptions  Medication Sig Dispense Refill  . amLODipine (NORVASC) 5 MG tablet Take 1 tablet (5 mg total) by mouth daily. 30 tablet 6  . dexlansoprazole (DEXILANT) 60 MG capsule Take 1 capsule (60 mg total) by mouth daily. 30 capsule 1  . lidocaine-prilocaine (EMLA) cream Apply 1 application topically as needed (prior to accessing port).    Marland Kitchen lisinopril (PRINIVIL,ZESTRIL) 20 MG tablet Take 1 tablet (20 mg total) by mouth daily. 30 tablet 6  . metFORMIN (GLUCOPHAGE) 500 MG tablet Take 500 mg by mouth daily.   1  . Vitamin D, Ergocalciferol, (DRISDOL) 50000 units CAPS capsule Take 50,000 Units by mouth every 7 (seven) days. Pt takes on Wednesday.    . zinc oxide (BALMEX) 11.3 % CREA cream Apply 1 application topically as needed (for irritation).    . ondansetron (ZOFRAN) 8 MG tablet Take 8 mg by mouth 2 (two) times daily as needed for nausea or vomiting.    . prochlorperazine (COMPAZINE) 10 MG tablet Take 10 mg by mouth every 6 (six) hours as needed for nausea or vomiting.     No current facility-administered medications for this encounter.     Physical  Findings: The patient is in no acute distress. Patient is alert and oriented.  height is 5\' 4"  (1.626 m) and weight is 189 lb (85.7 kg). Her oral temperature is 97.8 F (36.6 C). Her blood pressure is 154/90 (abnormal) and her pulse is 101 (abnormal). Her oxygen saturation is 100%.  Lungs are clear to auscultation bilaterally. Heart has regular rate and rhythm. No palpable cervical, supraclavicular, or axillary adenopathy. Abdomen soft, non-tender, normal bowel sounds. Left breast: no palpable mass or nipple discharge. Right breast: hyperpigmentation changes. No palpable  mass or nipple discharge.  Lab Findings: Lab Results  Component Value Date   WBC 5.1 11/28/2016   HGB 12.3 11/28/2016   HCT 37.3 11/28/2016   MCV 99 11/28/2016   PLT 254 11/28/2016    Radiographic Findings: No results found.  Impression:   No evidence of recurrence on clinical exam.  Plan:  PRN f/u with radiation oncology and close follow up in medical oncology  -----------------------------------  Blair Promise, PhD, MD  This document serves as a record of services personally performed by Gery Pray, MD. It was created on his behalf by Linward Natal, a trained medical scribe. The creation of this record is based on the scribe's personal observations and the provider's statements to them. This document has been  checked and approved by the attending provider.

## 2016-11-30 NOTE — Progress Notes (Signed)
[name] is here for follow up after treatment to her right breast.  She denies having pain and fatigue.  She will have a mammogram July 21 or 27. The skin on her right breast has hyperpigmentation.    BP (!) 154/90 (BP Location: Left Arm, Patient Position: Sitting)   Pulse (!) 101   Temp 97.8 F (36.6 C) (Oral)   Ht 5\' 4"  (1.626 m)   Wt 189 lb (85.7 kg)   LMP 05/22/2000   SpO2 100%   BMI 32.44 kg/m    Wt Readings from Last 3 Encounters:  11/30/16 189 lb (85.7 kg)  11/28/16 190 lb (86.2 kg)  06/28/16 182 lb 0.6 oz (82.6 kg)

## 2016-12-07 ENCOUNTER — Encounter: Payer: Self-pay | Admitting: Internal Medicine

## 2016-12-07 DIAGNOSIS — I129 Hypertensive chronic kidney disease with stage 1 through stage 4 chronic kidney disease, or unspecified chronic kidney disease: Secondary | ICD-10-CM | POA: Diagnosis not present

## 2016-12-07 DIAGNOSIS — I10 Essential (primary) hypertension: Secondary | ICD-10-CM | POA: Diagnosis not present

## 2016-12-07 DIAGNOSIS — Z6833 Body mass index (BMI) 33.0-33.9, adult: Secondary | ICD-10-CM | POA: Diagnosis not present

## 2016-12-07 DIAGNOSIS — N183 Chronic kidney disease, stage 3 (moderate): Secondary | ICD-10-CM | POA: Diagnosis not present

## 2016-12-07 DIAGNOSIS — E1122 Type 2 diabetes mellitus with diabetic chronic kidney disease: Secondary | ICD-10-CM | POA: Diagnosis not present

## 2016-12-08 DIAGNOSIS — R339 Retention of urine, unspecified: Secondary | ICD-10-CM | POA: Diagnosis not present

## 2016-12-08 DIAGNOSIS — Z124 Encounter for screening for malignant neoplasm of cervix: Secondary | ICD-10-CM | POA: Diagnosis not present

## 2016-12-08 DIAGNOSIS — R87610 Atypical squamous cells of undetermined significance on cytologic smear of cervix (ASC-US): Secondary | ICD-10-CM | POA: Diagnosis not present

## 2016-12-08 DIAGNOSIS — N8111 Cystocele, midline: Secondary | ICD-10-CM | POA: Diagnosis not present

## 2016-12-08 DIAGNOSIS — B372 Candidiasis of skin and nail: Secondary | ICD-10-CM | POA: Diagnosis not present

## 2016-12-20 ENCOUNTER — Other Ambulatory Visit: Payer: Self-pay | Admitting: Hematology & Oncology

## 2016-12-20 DIAGNOSIS — Z853 Personal history of malignant neoplasm of breast: Secondary | ICD-10-CM

## 2016-12-21 ENCOUNTER — Other Ambulatory Visit: Payer: Self-pay | Admitting: *Deleted

## 2016-12-21 ENCOUNTER — Telehealth: Payer: Self-pay | Admitting: *Deleted

## 2016-12-21 DIAGNOSIS — C9001 Multiple myeloma in remission: Secondary | ICD-10-CM

## 2016-12-21 NOTE — Telephone Encounter (Signed)
Received call from patient stating that she is having a lot of problems with her jaw and teeth.  Referral made to [facility] dentistry Dr Enrique Sack per dr. Marin Olp.

## 2016-12-26 ENCOUNTER — Ambulatory Visit
Admission: RE | Admit: 2016-12-26 | Discharge: 2016-12-26 | Disposition: A | Discharge status: Home / Self Care | Payer: Medicare Other | Source: Ambulatory Visit | Attending: Hematology & Oncology | Admitting: Hematology & Oncology

## 2016-12-26 DIAGNOSIS — R922 Inconclusive mammogram: Secondary | ICD-10-CM | POA: Diagnosis not present

## 2016-12-26 DIAGNOSIS — Z853 Personal history of malignant neoplasm of breast: Secondary | ICD-10-CM

## 2016-12-26 HISTORY — DX: Malignant neoplasm of unspecified site of unspecified female breast: C50.919

## 2017-01-01 ENCOUNTER — Telehealth: Payer: Self-pay | Admitting: *Deleted

## 2017-01-01 NOTE — Telephone Encounter (Signed)
Received call from [place] with Medication Assistance program.  Patient has conflicting allergies in PCP office and ours.  Pharmacist inquired about this.  After some research noted patient experienced throat itching and cough after being prescribed Lisinopril.  Patient called her PCP about this who switched her to Avapro 75 mg.  Medication list updated as well as allergy list.

## 2017-01-09 ENCOUNTER — Other Ambulatory Visit: Payer: Self-pay | Admitting: *Deleted

## 2017-01-09 ENCOUNTER — Encounter: Payer: Self-pay | Admitting: *Deleted

## 2017-01-31 IMAGING — CR DG CHEST 2V
2 series · 2 of 2 positions shown · non-contrast
Comparison: 10/29/2015, 10/27/2012

CLINICAL DATA: Worsening chest pain and shortness of breath. Breast
cancer. Receiving chemotherapy.

EXAM:
CHEST  2 VIEW

[w chest pa]
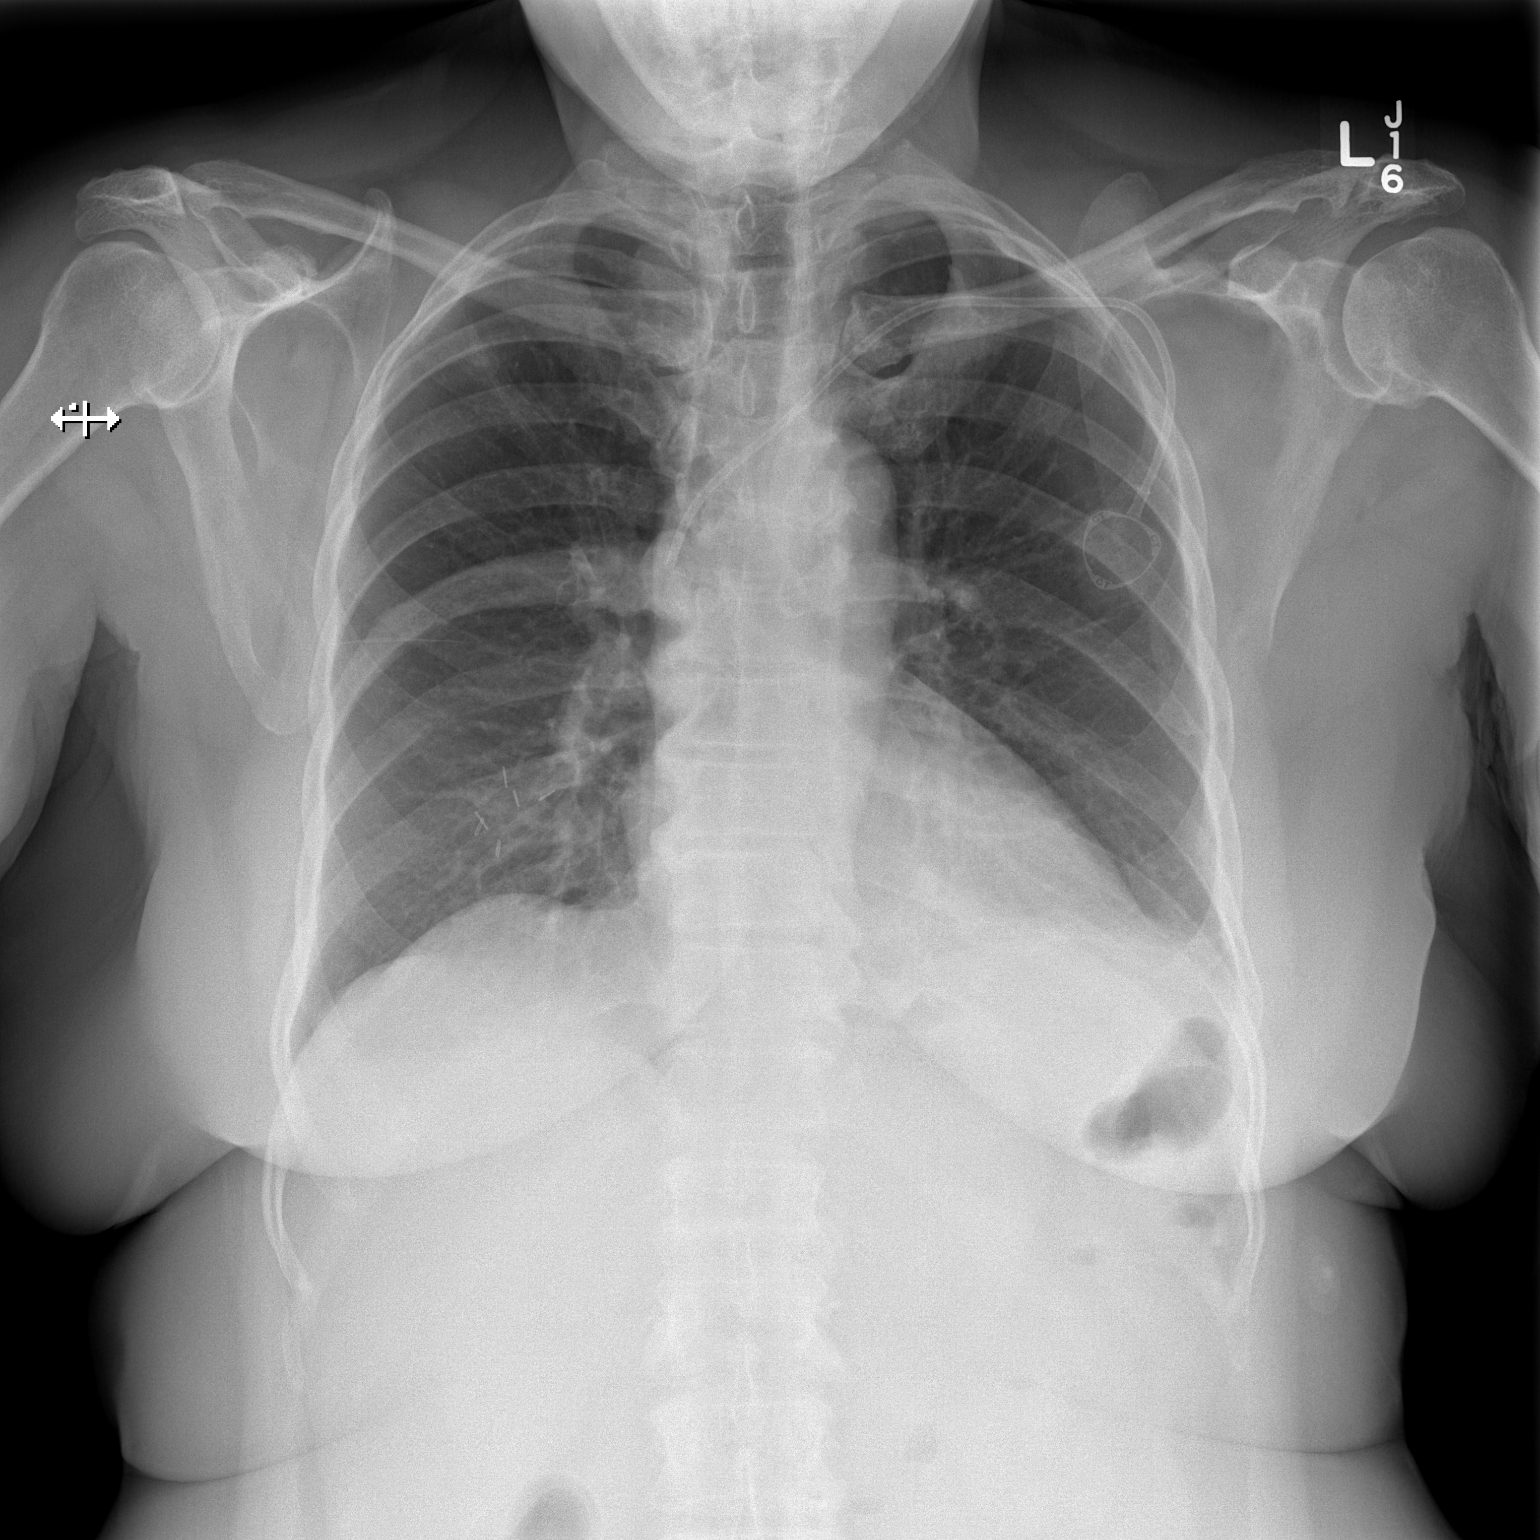

[w chest lat]
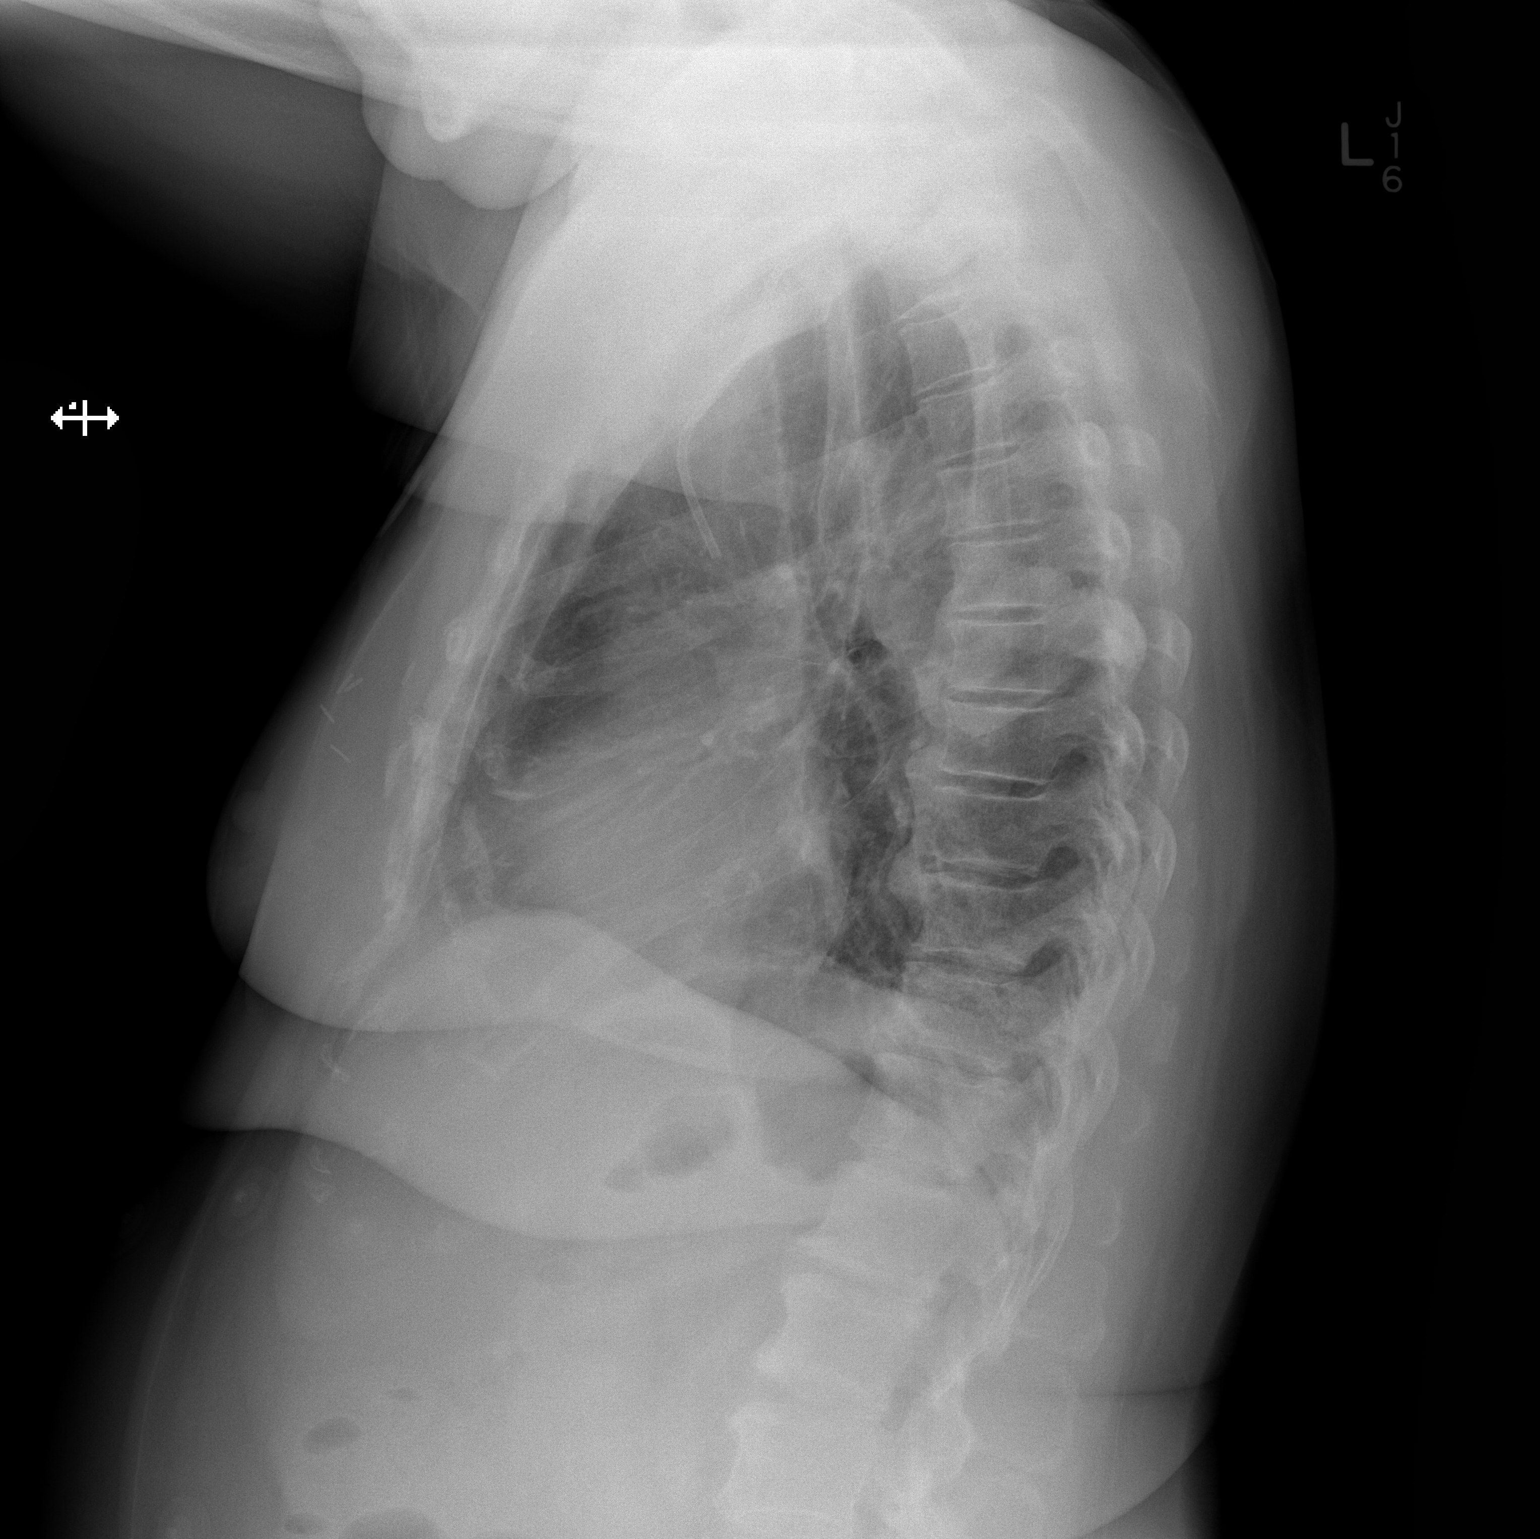

[2 of 2 positions shown; findings below may reference images not displayed]

FINDINGS: Postop changes over the right breast with surgical clips noted. Left
subclavian power port catheter tip proximal SVC as before. Normal
heart size and vascularity. Minimal left basilar streaky opacity,
atelectasis is favored over pneumonia.

Right lung remains clear. Artifact over the right apex. No edema,
significant effusion or pneumothorax. Trachea is midline.
Degenerative changes of the spine and left shoulder.
IMPRESSION: Streaky left lower lobe opacity compatible with atelectasis, less
likely developing pneumonia.

Stable postoperative findings as above

## 2017-02-02 IMAGING — DX DG CHEST 1V PORT
1 series · 1 of 1 positions shown · non-contrast
Comparison: 11/30/2015 and prior chest radiographs

CLINICAL DATA: 70-year-old female with acute shortness of breath
today.

EXAM:
PORTABLE CHEST 1 VIEW

[chest ap]
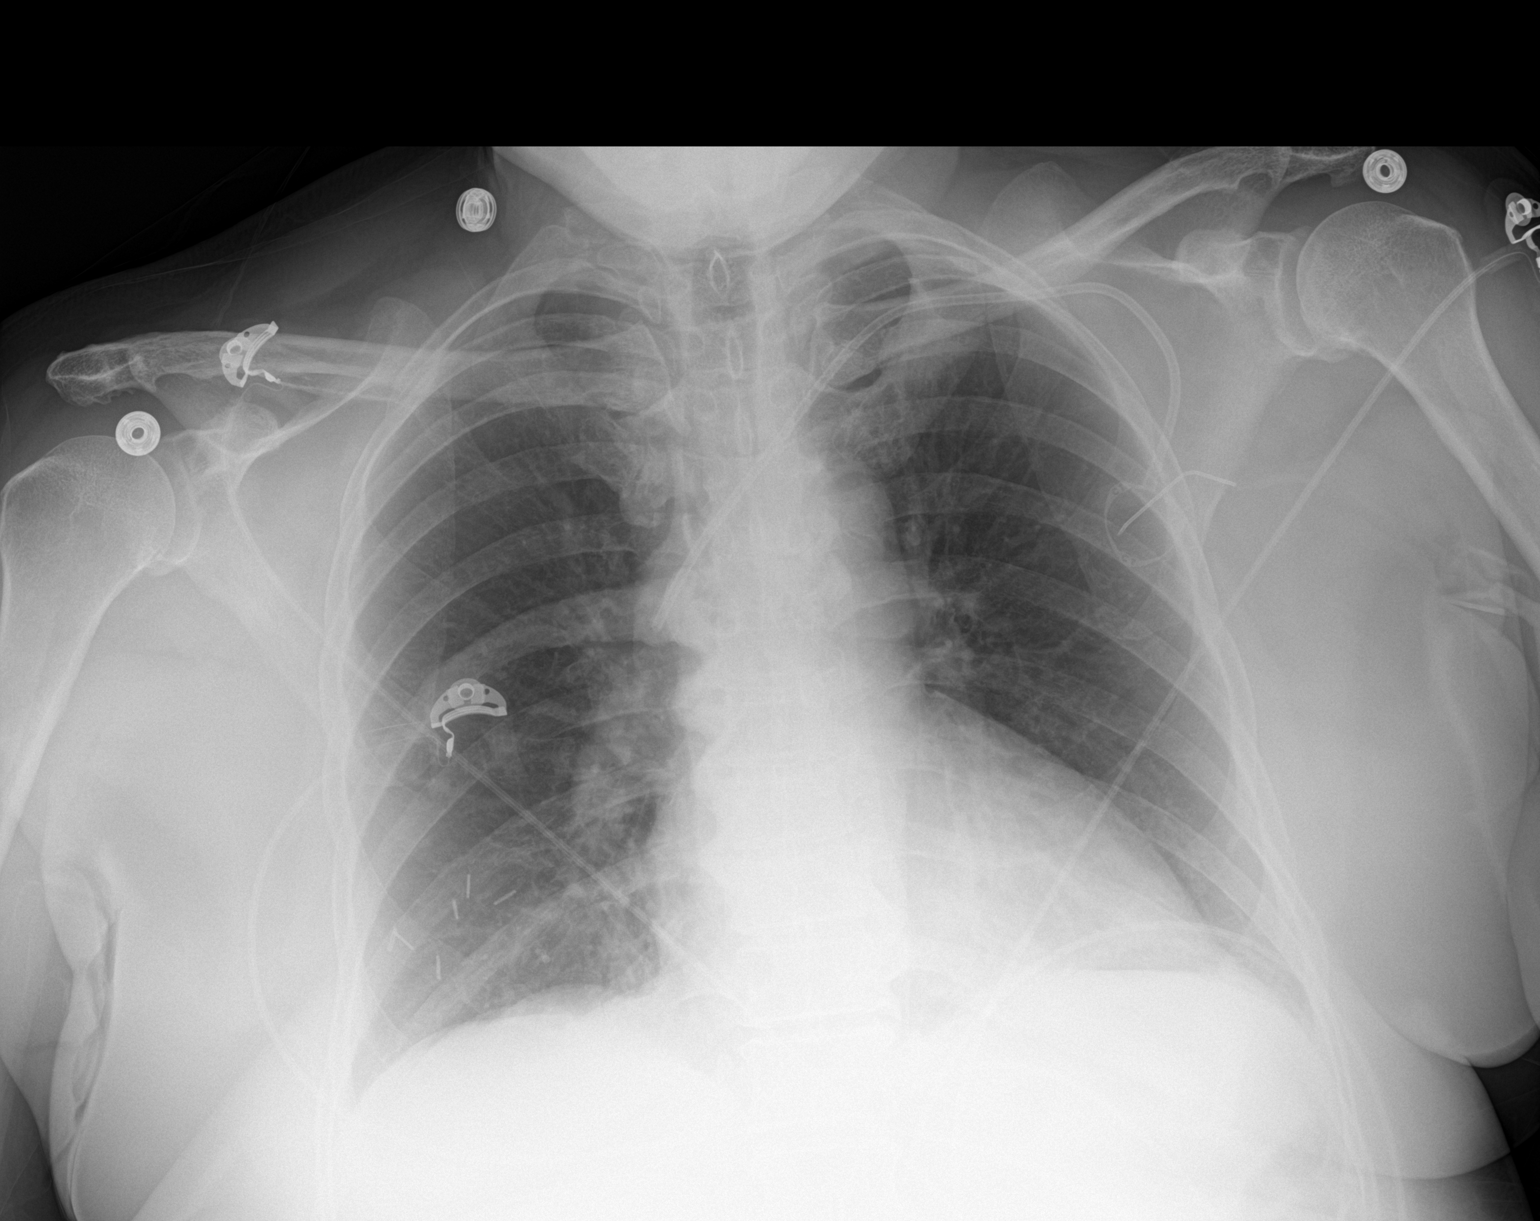

[1 of 1 positions shown; findings below may reference images not displayed]

FINDINGS: Upper limits normal heart size again noted.

A left subclavian Port-A-Cath is present with tip overlying the mid
SVC.

Mild pulmonary vascular congestion noted.

There is no evidence of focal airspace disease, pulmonary edema,
suspicious pulmonary nodule/mass, pleural effusion, or pneumothorax.

No acute bony abnormalities are identified.

Surgical clips overlying the lower right chest identified.
IMPRESSION: Mild pulmonary vascular congestion without other significant
abnormality.

## 2017-02-22 DIAGNOSIS — C50011 Malignant neoplasm of nipple and areola, right female breast: Secondary | ICD-10-CM | POA: Diagnosis not present

## 2017-02-22 DIAGNOSIS — E668 Other obesity: Secondary | ICD-10-CM | POA: Diagnosis not present

## 2017-02-22 DIAGNOSIS — Z23 Encounter for immunization: Secondary | ICD-10-CM | POA: Diagnosis not present

## 2017-02-22 DIAGNOSIS — I129 Hypertensive chronic kidney disease with stage 1 through stage 4 chronic kidney disease, or unspecified chronic kidney disease: Secondary | ICD-10-CM | POA: Diagnosis not present

## 2017-02-22 DIAGNOSIS — N183 Chronic kidney disease, stage 3 (moderate): Secondary | ICD-10-CM | POA: Diagnosis not present

## 2017-02-22 DIAGNOSIS — C9000 Multiple myeloma not having achieved remission: Secondary | ICD-10-CM | POA: Diagnosis not present

## 2017-02-22 DIAGNOSIS — Z6833 Body mass index (BMI) 33.0-33.9, adult: Secondary | ICD-10-CM | POA: Diagnosis not present

## 2017-02-22 DIAGNOSIS — E1122 Type 2 diabetes mellitus with diabetic chronic kidney disease: Secondary | ICD-10-CM | POA: Diagnosis not present

## 2017-03-17 ENCOUNTER — Other Ambulatory Visit: Payer: Self-pay | Admitting: Nurse Practitioner

## 2017-06-26 ENCOUNTER — Inpatient Hospital Stay: Payer: Medicare Other

## 2017-06-26 ENCOUNTER — Encounter: Payer: Self-pay | Admitting: Hematology & Oncology

## 2017-06-26 ENCOUNTER — Other Ambulatory Visit: Payer: Self-pay

## 2017-06-26 ENCOUNTER — Inpatient Hospital Stay: Payer: Medicare Other | Attending: Hematology & Oncology | Admitting: Hematology & Oncology

## 2017-06-26 VITALS — BP 164/88 | HR 72 | Temp 97.7°F | Resp 18 | Wt 191.5 lb

## 2017-06-26 DIAGNOSIS — C9001 Multiple myeloma in remission: Secondary | ICD-10-CM | POA: Diagnosis not present

## 2017-06-26 DIAGNOSIS — Z9484 Stem cells transplant status: Secondary | ICD-10-CM | POA: Diagnosis not present

## 2017-06-26 DIAGNOSIS — Z853 Personal history of malignant neoplasm of breast: Secondary | ICD-10-CM | POA: Diagnosis not present

## 2017-06-26 DIAGNOSIS — Z923 Personal history of irradiation: Secondary | ICD-10-CM | POA: Diagnosis not present

## 2017-06-26 DIAGNOSIS — M818 Other osteoporosis without current pathological fracture: Secondary | ICD-10-CM

## 2017-06-26 DIAGNOSIS — Z9221 Personal history of antineoplastic chemotherapy: Secondary | ICD-10-CM | POA: Diagnosis not present

## 2017-06-26 DIAGNOSIS — D61810 Antineoplastic chemotherapy induced pancytopenia: Secondary | ICD-10-CM

## 2017-06-26 DIAGNOSIS — Z171 Estrogen receptor negative status [ER-]: Secondary | ICD-10-CM | POA: Insufficient documentation

## 2017-06-26 DIAGNOSIS — I10 Essential (primary) hypertension: Secondary | ICD-10-CM

## 2017-06-26 DIAGNOSIS — C50911 Malignant neoplasm of unspecified site of right female breast: Secondary | ICD-10-CM

## 2017-06-26 DIAGNOSIS — Z79899 Other long term (current) drug therapy: Secondary | ICD-10-CM | POA: Insufficient documentation

## 2017-06-26 DIAGNOSIS — Z170 Estrogen receptor positive status [ER+]: Secondary | ICD-10-CM

## 2017-06-26 LAB — CBC WITH DIFFERENTIAL (CANCER CENTER ONLY)
BASOS ABS: 0 10*3/uL (ref 0.0–0.1)
BASOS PCT: 1 %
Eosinophils Absolute: 0.2 10*3/uL (ref 0.0–0.5)
Eosinophils Relative: 4 %
HEMATOCRIT: 37.9 % (ref 34.8–46.6)
HEMOGLOBIN: 12.4 g/dL (ref 11.6–15.9)
Lymphocytes Relative: 33 %
Lymphs Abs: 1.4 10*3/uL (ref 0.9–3.3)
MCH: 31.9 pg (ref 26.0–34.0)
MCHC: 32.7 g/dL (ref 32.0–36.0)
MCV: 97.4 fL (ref 81.0–101.0)
Monocytes Absolute: 0.5 10*3/uL (ref 0.1–0.9)
Monocytes Relative: 11 %
NEUTROS ABS: 2.3 10*3/uL (ref 1.5–6.5)
NEUTROS PCT: 51 %
Platelet Count: 270 10*3/uL (ref 145–400)
RBC: 3.89 MIL/uL (ref 3.70–5.32)
RDW: 13.5 % (ref 11.1–15.7)
WBC Count: 4.3 10*3/uL (ref 3.9–10.0)

## 2017-06-26 LAB — CMP (CANCER CENTER ONLY)
ALBUMIN: 3.7 g/dL (ref 3.5–5.0)
ALK PHOS: 85 U/L (ref 40–150)
ALT: 26 U/L (ref 0–55)
AST: 23 U/L (ref 5–34)
Anion gap: 11 (ref 3–11)
BILIRUBIN TOTAL: 0.3 mg/dL (ref 0.2–1.2)
BUN: 19 mg/dL (ref 7–26)
CALCIUM: 9.5 mg/dL (ref 8.4–10.4)
CO2: 23 mmol/L (ref 22–29)
CREATININE: 1.44 mg/dL — AB (ref 0.60–1.10)
Chloride: 107 mmol/L (ref 98–109)
GFR, EST NON AFRICAN AMERICAN: 36 mL/min — AB (ref >60)
GFR, Est AFR Am: 42 mL/min — ABNORMAL LOW (ref >60)
GLUCOSE: 92 mg/dL (ref 70–140)
POTASSIUM: 4.3 mmol/L (ref 3.5–5.1)
Sodium: 141 mmol/L (ref 136–145)
TOTAL PROTEIN: 7.7 g/dL (ref 6.4–8.3)

## 2017-06-26 MED ORDER — SODIUM CHLORIDE 0.9 % IJ SOLN
10.0000 mL | Freq: Once | INTRAMUSCULAR | Status: AC
  Administered 2017-06-26: 10 mL
  Filled 2017-06-26: qty 10

## 2017-06-26 MED ORDER — HEPARIN SOD (PORK) LOCK FLUSH 100 UNIT/ML IV SOLN
500.0000 [IU] | Freq: Once | INTRAVENOUS | Status: AC
  Administered 2017-06-26: 500 [IU] via INTRAVENOUS
  Filled 2017-06-26: qty 5

## 2017-06-26 NOTE — Progress Notes (Signed)
Hematology and Oncology Follow Up Visit  [facility] [id number] 05/24/1945 72 y.o. 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    Interim History:  Ms. Berkel is here today for follow-up.  She is doing quite well.  She had no problems over the holidays.  We had the big snow, she was off from work for a week or so.  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  She is exercising.  She is staying in great shape.  Her boyfriend is still selling honey.  She helps him during the summertime.  She is had no problems with cough or shortness of breath.  She has had no fever.  She has had no nausea or vomiting.  She has had no change in bowel or bladder habits.  Last mammogram was back in August 2018.  Everything looked fine.  She is planning on going to [facility] to visit her son in April.  She will be headed up to [place] to visit a daughter in July or August.  Overall, her performance status is ECOG 0.  Medications:  Allergies as of 06/26/2017      Reactions   Lisinopril Other (See Comments), Cough   Throat itching      Medication List        Accurate as of 06/26/17 10:38 AM. Always use your most recent med list.          amLODipine 5 MG tablet Commonly known as:  NORVASC Take 1 tablet (5 mg total) by mouth daily.   dexlansoprazole 60 MG capsule Commonly known as:  DEXILANT Take 1 capsule (60 mg total) by mouth daily.   EDARBI 40 MG Tabs Generic drug:  Azilsartan Medoxomil Take 40 mg by mouth every morning.   lidocaine-prilocaine cream Commonly known as:  EMLA Apply 1 application  topically as needed (prior to accessing port).   metFORMIN 500 MG tablet Commonly known as:  GLUCOPHAGE Take 500 mg by mouth daily.   ondansetron 8 MG tablet Commonly known as:  ZOFRAN Take 8 mg by mouth 2 (two) times daily as needed for nausea or vomiting.   prochlorperazine 10 MG tablet Commonly known as:  COMPAZINE Take 10 mg by mouth every 6 (six) hours as needed for nausea or vomiting.   Vitamin D (Ergocalciferol) 50000 units Caps capsule Commonly known as:  DRISDOL Take 50,000 Units by mouth every 7 (seven) days. Pt takes on Wednesday.   zinc oxide 11.3 % Crea cream Commonly known as:  BALMEX Apply 1 application topically as needed (for irritation).       Allergies:  Allergies  Allergen Reactions  . Lisinopril Other (See Comments) and Cough    Throat itching    Past Medical History, Surgical history, Social history, and Family History were reviewed and updated.  Review of Systems: Review of Systems  Constitutional: Negative.   HENT: Negative.   Eyes: Negative.   Respiratory: Negative.   Cardiovascular: Negative.   Gastrointestinal: Negative.   Genitourinary: Negative.   Musculoskeletal: Negative.   Skin: Negative.   Neurological: Negative.   Endo/Heme/Allergies: Negative.   Psychiatric/Behavioral: Negative.  Physical Exam:  weight is 191 lb 8 oz (86.9 kg). Her oral temperature is 97.7 F (36.5 C). Her blood pressure is 164/88 (abnormal) and her pulse is 72. Her respiration is 18 and oxygen saturation is 100%.   Wt Readings from Last 3 Encounters:  06/26/17 191 lb 8 oz (86.9 kg)  11/30/16 189 lb (85.7 kg)  11/28/16 190 lb (86.2 kg)    Physical Exam  Constitutional: She is oriented to person, place, and time.  HENT:  Head: Normocephalic and atraumatic.  Mouth/Throat: Oropharynx is clear and moist.  Eyes: EOM are normal. Pupils are equal, round, and reactive to light.  Neck: Normal range of motion.  Cardiovascular: Normal rate, regular  rhythm and normal heart sounds.  Pulmonary/Chest: Effort normal and breath sounds normal.  Abdominal: Soft. Bowel sounds are normal.  Musculoskeletal: Normal range of motion. She exhibits no edema, tenderness or deformity.  Lymphadenopathy:    She has no cervical adenopathy.  Neurological: She is alert and oriented to person, place, and time.  Skin: Skin is warm and dry. No rash noted. No erythema.  Psychiatric: She has a normal mood and affect. Her behavior is normal. Judgment and thought content normal.  Vitals reviewed.  Lab Results  Component Value Date   WBC 4.3 06/26/2017   HGB 12.3 11/28/2016   HCT 37.9 06/26/2017   MCV 97.4 06/26/2017   PLT 270 06/26/2017   Lab Results  Component Value Date   FERRITIN 2,980 (H) 12/01/2015   IRON 26 (L) 12/01/2015   TIBC 143 (L) 12/01/2015   UIBC 117 12/01/2015   IRONPCTSAT 18 12/01/2015   Lab Results  Component Value Date   RETICCTPCT <0.4 (L) 12/01/2015   RBC 3.89 06/26/2017   Lab Results  Component Value Date   KPAFRELGTCHN 3.35 (H) 03/08/2015   LAMBDASER 1.98 03/08/2015   KAPLAMBRATIO 1.62 11/28/2016   Lab Results  Component Value Date   IGGSERUM 1,177 11/28/2016   IGA 433 (H) 03/08/2015   IGMSERUM 106 11/28/2016   Lab Results  Component Value Date   TOTALPROTELP 7.8 03/08/2015   ALBUMINELP 4.2 03/08/2015   A1GS 0.4 (H) 03/08/2015   A2GS 0.9 03/08/2015   BETS 0.5 03/08/2015   BETA2SER 0.5 03/08/2015   GAMS 1.4 03/08/2015   MSPIKE Not Observed 11/28/2016   SPEI * 03/08/2015     Chemistry      Component Value Date/Time   NA 141 11/28/2016 0908   NA 142 10/22/2015 0950   K 4.0 11/28/2016 0908   K 4.2 10/22/2015 0950   CL 106 11/28/2016 0908   CO2 26 11/28/2016 0908   CO2 24 10/22/2015 0950   BUN 12 11/28/2016 0908   BUN 14.0 10/22/2015 0950   CREATININE 1.6 (H) 11/28/2016 0908   CREATININE 1.6 (H) 10/22/2015 0950      Component Value Date/Time   CALCIUM 9.6 11/28/2016 0908   CALCIUM 9.4 10/22/2015 0950     ALKPHOS 89 (H) 11/28/2016 0908   ALKPHOS 79 10/22/2015 0950   AST 25 11/28/2016 0908   AST 35 (H) 10/22/2015 0950   ALT 40 11/28/2016 0908   ALT 41 10/22/2015 0950   BILITOT 0.50 11/28/2016 0908   BILITOT 0.30 10/22/2015 0950     Impression and Plan: Ms. 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 She has completed chemo and radiation and continues to do quite well.  She is asymptomatic and has no complaints at this time.   We will hold on her Zometa today.  She has to go back to work soon.  We will get her back in 6 more months.  We will give her Zometa then.  Her myeloma has not been a problem.  It is now been over 12 years that she had her stem cell transplant for the myeloma.  She is cured of this.    [name], MD 2/5/201910:38 AM

## 2017-06-26 NOTE — Patient Instructions (Signed)
Implanted Port Home Guide An implanted port is a type of central line that is placed under the skin. Central lines are used to provide IV access when treatment or nutrition needs to be given through a person's veins. Implanted ports are used for long-term IV access. An implanted port may be placed because:  You need IV medicine that would be irritating to the small veins in your hands or arms.  You need long-term IV medicines, such as antibiotics.  You need IV nutrition for a long period.  You need frequent blood draws for lab tests.  You need dialysis.  Implanted ports are usually placed in the chest area, but they can also be placed in the upper arm, the abdomen, or the leg. An implanted port has two main parts:  Reservoir. The reservoir is round and will appear as a small, raised area under your skin. The reservoir is the part where a needle is inserted to give medicines or draw blood.  Catheter. The catheter is a thin, flexible tube that extends from the reservoir. The catheter is placed into a large vein. Medicine that is inserted into the reservoir goes into the catheter and then into the vein.  How will I care for my incision site? Do not get the incision site wet. Bathe or shower as directed by your health care provider. How is my port accessed? Special steps must be taken to access the port:  Before the port is accessed, a numbing cream can be placed on the skin. This helps numb the skin over the port site.  Your health care provider uses a sterile technique to access the port. ? Your health care provider must put on a mask and sterile gloves. ? The skin over your port is cleaned carefully with an antiseptic and allowed to dry. ? The port is gently pinched between sterile gloves, and a needle is inserted into the port.  Only "non-coring" port needles should be used to access the port. Once the port is accessed, a blood return should be checked. This helps ensure that the port  is in the vein and is not clogged.  If your port needs to remain accessed for a constant infusion, a clear (transparent) bandage will be placed over the needle site. The bandage and needle will need to be changed every week, or as directed by your health care provider.  Keep the bandage covering the needle clean and dry. Do not get it wet. Follow your health care provider's instructions on how to take a shower or bath while the port is accessed.  If your port does not need to stay accessed, no bandage is needed over the port.  What is flushing? Flushing helps keep the port from getting clogged. Follow your health care provider's instructions on how and when to flush the port. Ports are usually flushed with saline solution or a medicine called heparin. The need for flushing will depend on how the port is used.  If the port is used for intermittent medicines or blood draws, the port will need to be flushed: ? After medicines have been given. ? After blood has been drawn. ? As part of routine maintenance.  If a constant infusion is running, the port may not need to be flushed.  How long will my port stay implanted? The port can stay in for as long as your health care provider thinks it is needed. When it is time for the port to come out, surgery will be   done to remove it. The procedure is similar to the one performed when the port was put in. When should I seek immediate medical care? When you have an implanted port, you should seek immediate medical care if:  You notice a bad smell coming from the incision site.  You have swelling, redness, or drainage at the incision site.  You have more swelling or pain at the port site or the surrounding area.  You have a fever that is not controlled with medicine.  This information is not intended to replace advice given to you by your health care provider. Make sure you discuss any questions you have with your health care provider. Document  Released: 05/08/2005 Document Revised: 10/14/2015 Document Reviewed: 01/13/2013 Elsevier Interactive Patient Education  2017 Elsevier Inc.  

## 2017-06-27 LAB — KAPPA/LAMBDA LIGHT CHAINS
Kappa free light chain: 34.4 mg/L — ABNORMAL HIGH (ref 3.3–19.4)
Kappa, lambda light chain ratio: 2.02 — ABNORMAL HIGH (ref 0.26–1.65)
Lambda free light chains: 17 mg/L (ref 5.7–26.3)

## 2017-06-27 LAB — IGG, IGA, IGM
IGA: 314 mg/dL (ref 87–352)
IGM (IMMUNOGLOBULIN M), SRM: 104 mg/dL (ref 26–217)
IgG (Immunoglobin G), Serum: 1242 mg/dL (ref 700–1600)

## 2017-06-27 LAB — VITAMIN D 25 HYDROXY (VIT D DEFICIENCY, FRACTURES): VIT D 25 HYDROXY: 48.5 ng/mL (ref 30.0–100.0)

## 2017-06-28 LAB — PROTEIN ELECTROPHORESIS, SERUM, WITH REFLEX
A/G Ratio: 0.9 (ref 0.7–1.7)
Albumin ELP: 3.4 g/dL (ref 2.9–4.4)
Alpha-1-Globulin: 0.2 g/dL (ref 0.0–0.4)
Alpha-2-Globulin: 0.9 g/dL (ref 0.4–1.0)
BETA GLOBULIN: 1.2 g/dL (ref 0.7–1.3)
GAMMA GLOBULIN: 1.3 g/dL (ref 0.4–1.8)
Globulin, Total: 3.7 g/dL (ref 2.2–3.9)
Total Protein ELP: 7.1 g/dL (ref 6.0–8.5)

## 2017-08-13 DIAGNOSIS — E1122 Type 2 diabetes mellitus with diabetic chronic kidney disease: Secondary | ICD-10-CM | POA: Diagnosis not present

## 2017-08-13 DIAGNOSIS — E7849 Other hyperlipidemia: Secondary | ICD-10-CM | POA: Diagnosis not present

## 2017-08-13 DIAGNOSIS — R82998 Other abnormal findings in urine: Secondary | ICD-10-CM | POA: Diagnosis not present

## 2017-08-13 DIAGNOSIS — E559 Vitamin D deficiency, unspecified: Secondary | ICD-10-CM | POA: Diagnosis not present

## 2017-08-20 ENCOUNTER — Encounter (HOSPITAL_COMMUNITY): Payer: Self-pay | Admitting: Dentistry

## 2017-08-20 ENCOUNTER — Ambulatory Visit (HOSPITAL_COMMUNITY): Payer: Self-pay | Admitting: Dentistry

## 2017-08-20 VITALS — BP 190/81 | HR 75 | Temp 97.8°F

## 2017-08-20 DIAGNOSIS — K083 Retained dental root: Secondary | ICD-10-CM

## 2017-08-20 DIAGNOSIS — T148XXD Other injury of unspecified body region, subsequent encounter: Secondary | ICD-10-CM

## 2017-08-20 DIAGNOSIS — K0889 Other specified disorders of teeth and supporting structures: Secondary | ICD-10-CM

## 2017-08-20 DIAGNOSIS — K029 Dental caries, unspecified: Secondary | ICD-10-CM

## 2017-08-20 DIAGNOSIS — K08409 Partial loss of teeth, unspecified cause, unspecified class: Secondary | ICD-10-CM

## 2017-08-20 DIAGNOSIS — C9000 Multiple myeloma not having achieved remission: Secondary | ICD-10-CM

## 2017-08-20 DIAGNOSIS — K036 Deposits [accretions] on teeth: Secondary | ICD-10-CM

## 2017-08-20 DIAGNOSIS — K06010 Localized gingival recession, unspecified: Secondary | ICD-10-CM

## 2017-08-20 DIAGNOSIS — M264 Malocclusion, unspecified: Secondary | ICD-10-CM

## 2017-08-20 DIAGNOSIS — K0530 Chronic periodontitis, unspecified: Secondary | ICD-10-CM

## 2017-08-20 MED ORDER — CHLORHEXIDINE GLUCONATE 0.12 % MT SOLN: OROMUCOSAL | 99 refills | Status: DC

## 2017-08-20 NOTE — Patient Instructions (Signed)
The patient will be referred to Dr. Diona Browner, oral surgeon, for evaluation of delayed healing in the area of #21 as well as future extractions of tooth numbers 11, 12, 17, 20, 28, and 29 as needed. Patient may wish to proceed with segmental extractions to decrease risk for having multiple sites of exposed bone due to development of possible osteonecrosis of the jaw.  Dr. Enrique Sack

## 2017-08-20 NOTE — Progress Notes (Signed)
DENTAL CONSULTATION  Date of Consultation:  08/20/2017 Patient Name:   [name] Date of Birth:   July 24, 1945 Medical Record Number: [id number]  VITALS: BP (!) 190/81 (BP Location: Right Arm)   Pulse 75   Temp 97.8 F (36.6 C)   LMP 05/22/2000   CHIEF COMPLAINT: Patient referred by Dr. Marin Olp for a dental consultation.  HPI: [name] Is a 72 year old female with history of multiple myeloma and previous IV bisphosphonate use. Patient has received 14 doses of IV Zometa. Dental consultation requested to evaluate for possible osteonecrosis of the jaw related to previous IV bisphosphate therapy.  The patient currently denies acute toothaches, swellings, or abscesses. Patient was last seen 4-5 months ago for fabrication of an acrylic partial denture replacing tooth numbers 4, 7, 14, and 15. This was fabricated by [facility].  Patient denies having any problems with her maxillary acrylic partial denture at this time. Prior to that, the patient was seen approximately one year ago for a root canal therapy, post and core, and crown on tooth #8.  Patient also gives a history of  having tooth #21 "Fall out" approximately 4-5 months ago. He area where the tooth fell out is "still sore".The patient has not been seen for regular dental care since she lost her dental insurance while working for [employer].  The patient denies having dental phobia.  PROBLEM LIST: Patient Active Problem List   Diagnosis Date Noted  . Lytic bone lesions on xray 03/14/2016  . Dyspnea 12/14/2015  . AKI (acute kidney injury) ([place]) 12/14/2015  . Anemia of chronic disease 12/14/2015  . Leukocytosis 12/14/2015  . Tachycardia 12/14/2015  . Pressure ulcer 12/14/2015  . 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 E. coli gastroenteritis   . Pancytopenia due to chemotherapy ([place]) 12/01/2015  . Chronic kidney disease, stage 3 ([place]) 12/01/2015  . HSV-1 (herpes simplex virus 1)  infection 12/01/2015  . Dehydration 12/01/2015  . Anorexia 12/01/2015  . Loss of weight 12/01/2015  . Diarrhea 12/01/2015  . Cough 12/01/2015  . Protein-calorie malnutrition, severe 12/01/2015  . Breast cancer, stage 1 ([place]) 09/09/2015  . Encounter for CDL (commercial driving license) exam 95/32/0233  . Depression 04/11/2012  . Personal history of adenomatous colonic polyps 11/02/2011  . Degenerative joint disease involving multiple joints 03/29/2011  . Myeloma ([place]) 03/28/2011    PMH: Past Medical History:  Diagnosis Date  . Anemia   . Arthralgia    chronic, 2nd degree to stem cell transplantation  . Breast cancer ([place])   . Breast cancer, stage 1 ([place]) 09/09/2015  . Chronic pain of left knee   . Degenerative joint disease involving multiple joints 03/29/2011  . DJD (degenerative joint disease)   . GERD (gastroesophageal reflux disease)   . Herpes simplex esophagitis 2006  . History of blood transfusion    with first pregnancy  . History of multiple myeloma    chemo XRT, Dr. Marin Olp  . History of radiation therapy 03/16/16-05/02/16   right breast 50.4 Gy in 28 fractions, boost to 60.4 Gy in 5 fractions  . HTN (hypertension) 2005  . Hyperlipidemia   . Myeloma ([place]) 03/28/2011  . OA (osteoarthritis)   . Paget's bone disease   . Personal history of adenomatous colonic polyps 11/02/2011   2 diminutive serrated adenomas 10/2011 Repeat colonoscopy 2018 (change from original recall based upon current guidelines 12/07/2014)    . PONV (postoperative nausea and vomiting)   . Radiation 09/28/2006 - 10/11/2006  skull treated to 1980 cGy by Dr. Elba Barman  . Renal insufficiency    H/O resolved  . Vitamin D deficiency     PSH: Past Surgical History:  Procedure Laterality Date  . 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 BREAST LUMPECTOMY Right 11/2015   radiation and chemo  . BREAST LUMPECTOMY WITH RADIOACTIVE SEED AND SENTINEL LYMPH NODE  BIOPSY Right 09/22/2015   Procedure: BREAST LUMPECTOMY WITH RADIOACTIVE SEED AND SENTINEL LYMPH NODE BIOPSY;  Surgeon: Excell Seltzer, MD;  Location: [facility];  Service: General;  Laterality: Right;  . JOINT REPLACEMENT Left   . KNEE ARTHROSCOPY  05/2008   left  . TOTAL KNEE ARTHROPLASTY  10/11/2010   left    ALLERGIES: Allergies  Allergen Reactions  . Lisinopril Other (See Comments) and Cough    Throat itching    MEDICATIONS: Current Outpatient Medications  Medication Sig Dispense Refill  . amLODipine (NORVASC) 5 MG tablet Take 1 tablet (5 mg total) by mouth daily. 30 tablet 6  . Azilsartan Medoxomil (EDARBI) 40 MG TABS Take 40 mg by mouth every morning.    Marland Kitchen dexlansoprazole (DEXILANT) 60 MG capsule Take 1 capsule (60 mg total) by mouth daily. 30 capsule 1  . lidocaine-prilocaine (EMLA) cream Apply 1 application topically as needed (prior to accessing port).    . metFORMIN (GLUCOPHAGE) 500 MG tablet Take 500 mg by mouth daily.   1  . ondansetron (ZOFRAN) 8 MG tablet Take 8 mg by mouth 2 (two) times daily as needed for nausea or vomiting.    . prochlorperazine (COMPAZINE) 10 MG tablet Take 10 mg by mouth every 6 (six) hours as needed for nausea or vomiting.    . Vitamin D, Ergocalciferol, (DRISDOL) 50000 units CAPS capsule Take 50,000 Units by mouth every 7 (seven) days. Pt takes on Wednesday.    . zinc oxide (BALMEX) 11.3 % CREA cream Apply 1 application topically as needed (for irritation).     No current facility-administered medications for this visit.     LABS: Lab Results  Component Value Date   WBC 4.3 06/26/2017   HGB 12.3 11/28/2016   HCT 37.9 06/26/2017   MCV 97.4 06/26/2017   PLT 270 06/26/2017      Component Value Date/Time   NA 141 06/26/2017 0940   NA 141 11/28/2016 0908   NA 142 10/22/2015 0950   K 4.3 06/26/2017 0940   K 4.0 11/28/2016 0908   K 4.2 10/22/2015 0950   CL 107 06/26/2017 0940   CL 106 11/28/2016 0908   CO2 23 06/26/2017  0940   CO2 26 11/28/2016 0908   CO2 24 10/22/2015 0950   GLUCOSE 92 06/26/2017 0940   GLUCOSE 130 (H) 11/28/2016 0908   BUN 19 06/26/2017 0940   BUN 12 11/28/2016 0908   BUN 14.0 10/22/2015 0950   CREATININE 1.44 (H) 06/26/2017 0940   CREATININE 1.6 (H) 11/28/2016 0908   CREATININE 1.6 (H) 10/22/2015 0950   CALCIUM 9.5 06/26/2017 0940   CALCIUM 9.6 11/28/2016 0908   CALCIUM 9.4 10/22/2015 0950   GFRNONAA 36 (L) 06/26/2017 0940   GFRAA 42 (L) 06/26/2017 0940   Lab Results  Component Value Date   INR 1.07 12/14/2015   INR 2.12 (H) 10/14/2010   INR 1.49 10/13/2010   No results found for: PTT  SOCIAL HISTORY: Social History   Socioeconomic History  . Marital status: Legally Separated    Spouse  name: Not on file  . Number of children: 3  . Years of education: Not on file  . Highest education level: Not on file  Occupational History  . Occupation: disability    Employer: RETIRED  Social Needs  . Financial resource strain: Not on file  . Food insecurity:    Worry: Not on file    Inability: Not on file  . Transportation needs:    Medical: Not on file    Non-medical: Not on file  Tobacco Use  . Smoking status: Never Smoker  . Smokeless tobacco: Never Used  . Tobacco comment: never used tobacco  Substance and Sexual Activity  . Alcohol use: No    Alcohol/week: 0.0 oz  . Drug use: No  . Sexual activity: Never  Lifestyle  . Physical activity:    Days per week: Not on file    Minutes per session: Not on file  . Stress: Not on file  Relationships  . Social connections:    Talks on phone: Not on file    Gets together: Not on file    Attends religious service: Not on file    Active member of club or organization: Not on file    Attends meetings of clubs or organizations: Not on file    Relationship status: Not on file  . Intimate partner violence:    Fear of current or ex partner: Not on file    Emotionally abused: Not on file    Physically abused: Not on file     Forced sexual activity: Not on file  Other Topics Concern  . Not on file  Social History Narrative   Workouts:   Treadmill 3x/ week   Arm weights daily           FAMILY HISTORY: Family History  Problem Relation Age of Onset  . Lung cancer Father        smoker  . Throat cancer Brother   . Heart attack Mother   . Diabetes Mother   . Diabetes Sister   . Liver cancer Brother   . Pancreatic cancer Sister   . Rheum arthritis Sister   . Colon cancer Neg Hx     REVIEW OF SYSTEMS: Reviewed with the patient as per History of present illness. Psych: Patient denies having dental phobia.  DENTAL HISTORY: CHIEF COMPLAINT: Patient referred by Dr. Marin Olp for a dental consultation.  HPI: [name] Is a 72 year old female with history of multiple myeloma and previous IV bisphosphonate use. Patient has received 14 doses of IV Zometa. Dental consultation requested to evaluate for possible osteonecrosis of the jaw related to previous IV bisphosphate therapy.  The patient currently denies acute toothaches, swellings, or abscesses. Patient was last seen 4-5 months ago for fabrication of an acrylic partial denture replacing tooth numbers 4, 7, 14, and 15. This was fabricated by [facility].  Patient denies having any problems with her maxillary acrylic partial denture at this time. Prior to that, the patient was seen approximately one year ago for a root canal therapy, post and core, and crown on tooth #8.  Patient also gives a history of  having tooth #21 "Fall out" approximately 4-5 months ago. He area where the tooth fell out is "still sore".The patient has not been seen for regular dental care since she lost her dental insurance while working for [employer].  The patient denies having dental phobia.  DENTAL EXAMINATION: GENERAL:  The patient is a well-developed, well-nourished female  in no acute distress. HEAD AND NECK:  There is no palpable neck  lymphadenopathy. The patient denies acute TMJ symptoms. INTRAORAL EXAM:  There is no evidence of oral abscess formation. There is delayed healing in the area where tooth #21 when the tooth fell  out approximately 4-5 months ago.  This may represent an area of osteonecrosis of the jaw. DENTITION:  Patient is missing tooth numbers 1, 2, 4, 7, 14, 15, 16, 18, 19, 21, 30, 31, and 32. There may be a retained root in the area of #31 that is now covered by bone. PERIODONTAL:  The patient has chronic periodontitis with plaque and calculus accumulations, gingival recession, and tooth mobility. There is incipient to moderate bone loss noted.  There is extensive bone loss in the area of #21. DENTAL CARIES/SUBOPTIMAL RESTORATIONS:  Multiple dental caries and suboptimal dental restorations are noted as per dental charting form. There appears to be a fracture of the crown at the gum line associated with tooth #11. ENDODONTIC:  Patient has had previous root canal therapy associated with tooth numbers 8, 10, 11 and 17. CROWN AND BRIDGE: There are multiple crown restorations noted. Some of the crown restorations are suboptimal secondary to recurrent caries PROSTHODONTIC: The patient has a maxillary acrylic partial denture replacing tooth numbers 4, 7, 14, 15 that is acceptable. OCCLUSION:  The patient has a poor occlusal scheme secondary to multiple missing teeth, supra-eruption and drifting of the unopposed teeth into the edentulous areas, lack of replacement of all missing teeth, and class III malocclusion.  RADIOGRAPHIC INTERPRETATION: An orthopantogram was taken and supplemented with a full series of dental radiographs.  There is a retained root segment in the area of #28. There are multiple root canal therapies associated with tooth numbers 8, 10, 11, and 17. There are multiple dental caries and suboptimal dental restorations noted.There is supra-eruption and drifting of the unopposed teeth into the edentulous  areas.There is incipient to moderate bone loss noted. There is a radiolucent area in previous #21 extraction site that may be consistent with delayed healing and/or osteonecrosis of the jaw.     ASSESSMENTS: 1. Multiple myeloma with history of 14 doses of IV Zometa 2. 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 4. Multiple dental caries and suboptimal dental restorations 5. Fracture of the crown on tooth #11 at the gum line with mobility of the coronal aspect of the crown. 6. Chronic periodontitis with bone loss 7. Gingival recession 8. Tooth mobility 9. Accretions 10. Multiple missing teeth 11. Supra-eruption and drifting of the unopposed teeth into the edentulous areas 12. Class III malocclusion 13. Questionable need for antibiotic premedication prior to invasive dental procedures due to previous total knee replacement 14. Risk for osteonecrosis of the jaw with anticipated invasive dental procedures due to history of 14 doses of IV Zometa therapy  PLAN/RECOMMENDATIONS: 1. 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  We discussed various treatment options to include no treatment, multiple extractions with alveoloplasty, pre-prosthetic surgery as indicated, periodontal therapy, dental restorations, root canal therapy, crown and bridge therapy, implant therapy, and replacement of missing teeth as indicated. We also discussed referral of the patient to an oral surgeon to evaluate delayed healing in the area of #  21 and to discuss future extractions as indicated with the risk for future osteonecrosis of the jaw related to previous bisphosphonate therapy. We also discussed referral to [facility] for comprehensive dental evaluation and treatment due to her medical and dental complexities. The patient currently wishes to proceed with evaluation by a local oral surgeon for the delayed healing in the area of #21 and to discuss future dental extractions as indicated.  Patient will then consider follow-up at the [facility] for her other comprehensive dental care needs.  Patient currently agrees to the use of chlorhexidine rinses twice daily as prescribed to assist in this infection of the oral cavity.  2. Discussion of findings with medical team and coordination of future medical and dental care as needed.  I spent in excess of  120 minutes during the conduct of this consultation and >50% of this time involved direct face-to-face encounter for counseling and/or coordination of the patient's care.    Lenn Cal, DDS

## 2017-08-24 ENCOUNTER — Encounter: Payer: Self-pay | Admitting: Internal Medicine

## 2017-08-24 LAB — IFOBT (OCCULT BLOOD): IMMUNOLOGICAL FECAL OCCULT BLOOD TEST: POSITIVE

## 2017-08-29 ENCOUNTER — Encounter (HOSPITAL_COMMUNITY): Payer: Self-pay | Admitting: Emergency Medicine

## 2017-08-29 ENCOUNTER — Emergency Department (HOSPITAL_COMMUNITY)
Admission: EM | Admit: 2017-08-29 | Discharge: 2017-08-29 | Disposition: A | Discharge status: Home / Self Care | Payer: No Typology Code available for payment source | Attending: Emergency Medicine | Admitting: Emergency Medicine

## 2017-08-29 ENCOUNTER — Emergency Department (HOSPITAL_COMMUNITY): Payer: No Typology Code available for payment source

## 2017-08-29 DIAGNOSIS — Y929 Unspecified place or not applicable: Secondary | ICD-10-CM | POA: Diagnosis not present

## 2017-08-29 DIAGNOSIS — I10 Essential (primary) hypertension: Secondary | ICD-10-CM | POA: Insufficient documentation

## 2017-08-29 DIAGNOSIS — Z79899 Other long term (current) drug therapy: Secondary | ICD-10-CM | POA: Diagnosis not present

## 2017-08-29 DIAGNOSIS — Z853 Personal history of malignant neoplasm of breast: Secondary | ICD-10-CM | POA: Insufficient documentation

## 2017-08-29 DIAGNOSIS — Z041 Encounter for examination and observation following transport accident: Secondary | ICD-10-CM | POA: Diagnosis present

## 2017-08-29 DIAGNOSIS — Y939 Activity, unspecified: Secondary | ICD-10-CM | POA: Diagnosis not present

## 2017-08-29 DIAGNOSIS — R51 Headache: Secondary | ICD-10-CM | POA: Insufficient documentation

## 2017-08-29 DIAGNOSIS — Y999 Unspecified external cause status: Secondary | ICD-10-CM | POA: Diagnosis not present

## 2017-08-29 DIAGNOSIS — Z9481 Bone marrow transplant status: Secondary | ICD-10-CM | POA: Insufficient documentation

## 2017-08-29 DIAGNOSIS — R519 Headache, unspecified: Secondary | ICD-10-CM
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
## 2017-08-29 NOTE — ED Notes (Signed)
ED Provider at bedside. 

## 2017-09-10 ENCOUNTER — Telehealth: Payer: Self-pay

## 2017-09-10 NOTE — Telephone Encounter (Signed)
Patient has been scheduled for endo/colon for 6/5 and 6/20

## 2017-09-10 NOTE — Telephone Encounter (Signed)
-----   Message from Gatha Mayer, MD sent at 09/07/2017 10:34 AM EDT ----- Regarding: needs colonoscopy hemosure + and has hx polyps  Please set up direct colonoscopy  Papers on your desk  Thx  CEG

## 2017-09-10 NOTE — Telephone Encounter (Signed)
duplicate

## 2017-10-24 ENCOUNTER — Ambulatory Visit (AMBULATORY_SURGERY_CENTER): Payer: Self-pay

## 2017-10-24 ENCOUNTER — Other Ambulatory Visit: Payer: Self-pay

## 2017-10-24 VITALS — Ht 62.0 in | Wt 191.0 lb

## 2017-10-24 DIAGNOSIS — R195 Other fecal abnormalities: Secondary | ICD-10-CM

## 2017-10-24 NOTE — Progress Notes (Signed)
No egg or soy allergy known to patient  No issues with past sedation with any surgeries  or procedures, no intubation problems  No diet pills per patient No home 02 use per patient  No blood thinners per patient  Pt denies issues with constipation  No A fib or A flutter  EMMI video sent to pt's e mail pt declined   

## 2017-10-24 NOTE — Progress Notes (Signed)
Pt is not sure if she is taking Metformin. Advised when she gets home to look at bottles. If she is taking it not to take the day of her procedure.  Pt and pt husband verbalize understanding.

## 2017-11-08 ENCOUNTER — Encounter: Payer: Self-pay | Admitting: Internal Medicine

## 2017-11-08 ENCOUNTER — Other Ambulatory Visit: Payer: Self-pay

## 2017-11-08 ENCOUNTER — Ambulatory Visit (AMBULATORY_SURGERY_CENTER): Payer: Medicare Other | Admitting: Internal Medicine

## 2017-11-08 VITALS — BP 186/82 | HR 75 | Temp 98.9°F | Resp 19 | Ht 62.0 in | Wt 191.0 lb

## 2017-11-08 DIAGNOSIS — Z86010 Personal history of colonic polyps: Secondary | ICD-10-CM

## 2017-11-08 DIAGNOSIS — R195 Other fecal abnormalities: Secondary | ICD-10-CM

## 2017-11-08 MED ORDER — SODIUM CHLORIDE 0.9 % IV SOLN: 500.0000 mL | Freq: Once | INTRAVENOUS | Status: DC

## 2017-11-08 NOTE — Op Note (Signed)
[name] Patient Name: [name] Procedure Date: 11/08/2017 3:44 PM MRN: [id number] Endoscopist: Gatha Mayer , MD Age: 72 Referring MD:  Date of Birth: Oct 19, 1945 Gender: Female Account #: [id number] Procedure:                Colonoscopy Indications:              Heme positive stool, Personal history of colonic                            polyps Medicines:                Propofol per Anesthesia, Monitored Anesthesia Care Procedure:                Pre-Anesthesia Assessment:                           - Prior to the procedure, a History and Physical                            was performed, and patient medications and                            allergies were reviewed. The patient's tolerance of                            previous anesthesia was also reviewed. The risks                            and benefits of the procedure and the sedation                            options and risks were discussed with the patient.                            All questions were answered, and informed consent                            was obtained. Prior Anticoagulants: The patient has                            taken no previous anticoagulant or antiplatelet                            agents. ASA Grade Assessment: II - A patient with                            mild systemic disease. After reviewing the risks                            and benefits, the patient was deemed in                            satisfactory condition to undergo the procedure.  After obtaining informed consent, the colonoscope                            was passed under direct vision. Throughout the                            procedure, the patient's blood pressure, pulse, and                            oxygen saturations were monitored continuously. The                            Colonoscope was introduced through the anus and                            advanced to the the cecum,  identified by                            appendiceal orifice and ileocecal valve. The                            colonoscopy was performed without difficulty. The                            patient tolerated the procedure well. The quality                            of the bowel preparation was adequate. The                            ileocecal valve, appendiceal orifice, and rectum                            were photographed. The bowel preparation used was                            Miralax. Scope In: 3:52:47 PM Scope Out: 4:03:37 PM Scope Withdrawal Time: 0 hours 7 minutes 47 seconds  Total Procedure Duration: 0 hours 10 minutes 50 seconds  Findings:                 The perianal and digital rectal examinations were                            normal.                           Internal hemorrhoids were found during retroflexion.                           The exam was otherwise without abnormality on                            direct and retroflexion views. Complications:            No immediate complications. Estimated Blood Loss:  Estimated blood loss: none. Impression:               - Internal hemorrhoids. Likely source iFOBT + stool                           - The examination was otherwise normal on direct                            and retroflexion views.                           - No specimens collected. Recommendation:           - Patient has a contact number available for                            emergencies. The signs and symptoms of potential                            delayed complications were discussed with the                            patient. Return to normal activities tomorrow.                            Written discharge instructions were provided to the                            patient.                           - Resume previous diet.                           - Continue present medications.                           - Repeat colonoscopy in 5 years for  surveillance.                           - would not do routine hemoccult testing Gatha Mayer, MD 11/08/2017 4:12:45 PM This report has been signed electronically.

## 2017-11-08 NOTE — Patient Instructions (Addendum)
   No polyps seen today. Your next routine colonoscopy should be in 5 years - 2024.  I think the blood came from small internal hemorrhoids.  I appreciate the opportunity to care for you. Gatha Mayer, MD, FACG  YOU HAD AN ENDOSCOPIC PROCEDURE TODAY AT [facility]:   Refer to the procedure report that was given to you for any specific questions about what was found during the examination.  If the procedure report does not answer your questions, please call your gastroenterologist to clarify.  If you requested that your care partner not be given the details of your procedure findings, then the procedure report has been included in a sealed envelope for you to review at your convenience later.  YOU SHOULD EXPECT: Some feelings of bloating in the abdomen. Passage of more gas than usual.  Walking can help get rid of the air that was put into your GI tract during the procedure and reduce the bloating. If you had a lower endoscopy (such as a colonoscopy or flexible sigmoidoscopy) you may notice spotting of blood in your stool or on the toilet paper. If you underwent a bowel prep for your procedure, you may not have a normal bowel movement for a few days.  Please Note:  You might notice some irritation and congestion in your nose or some drainage.  This is from the oxygen used during your procedure.  There is no need for concern and it should clear up in a day or so.  SYMPTOMS TO REPORT IMMEDIATELY:   Following lower endoscopy (colonoscopy or flexible sigmoidoscopy):  Excessive amounts of blood in the stool  Significant tenderness or worsening of abdominal pains  Swelling of the abdomen that is new, acute  Fever of 100F or higher  For urgent or emergent issues, a gastroenterologist can be reached at any hour by calling [phone].   DIET:  We do recommend a small meal at first, but then you may proceed to your regular diet.  Drink plenty of fluids but you should  avoid alcoholic beverages for 24 hours.  ACTIVITY:  You should plan to take it easy for the rest of today and you should NOT DRIVE or use heavy machinery until tomorrow (because of the sedation medicines used during the test).    FOLLOW UP: Our staff will call the number listed on your records the next business day following your procedure to check on you and address any questions or concerns that you may have regarding the information given to you following your procedure. If we do not reach you, we will leave a message.  However, if you are feeling well and you are not experiencing any problems, there is no need to return our call.  We will assume that you have returned to your regular daily activities without incident.  If any biopsies were taken you will be contacted by phone or by letter within the next 1-3 weeks.  Please call us at [phone] if you have not heard about the biopsies in 3 weeks.    SIGNATURES/CONFIDENTIALITY: You and/or your care partner have signed paperwork which will be entered into your electronic medical record.  These signatures attest to the fact that that the information above on your After Visit Summary has been reviewed and is understood.  Full responsibility of the confidentiality of this discharge information lies with you and/or your care-partner.

## 2017-11-08 NOTE — Progress Notes (Signed)
Pt's states no medical or surgical changes since previsit or office visit. 

## 2017-11-08 NOTE — Progress Notes (Signed)
Alert and oriented X3, report given to RN

## 2017-11-09 ENCOUNTER — Telehealth: Payer: Self-pay | Admitting: *Deleted

## 2017-11-09 NOTE — Telephone Encounter (Signed)
  Follow up Call-  Call back number 11/08/2017  Post procedure Call Back phone  # [phone]  Permission to leave phone message Yes  Some recent data might be hidden     Patient questions:  Do you have a fever, pain , or abdominal swelling? No. Pain Score  0 *  Have you tolerated food without any problems? Yes.    Have you been able to return to your normal activities? Yes.    Do you have any questions about your discharge instructions: Diet   No. Medications  No. Follow up visit  No.  Do you have questions or concerns about your Care? No.  Actions: * If pain score is 4 or above: No action needed, pain <4.

## 2017-11-26 ENCOUNTER — Other Ambulatory Visit: Payer: Self-pay | Admitting: Hematology & Oncology

## 2017-11-26 DIAGNOSIS — Z853 Personal history of malignant neoplasm of breast: Secondary | ICD-10-CM

## 2017-12-25 ENCOUNTER — Inpatient Hospital Stay: Payer: Medicare Other | Attending: Hematology & Oncology | Admitting: Hematology & Oncology

## 2017-12-25 ENCOUNTER — Encounter: Payer: Self-pay | Admitting: Hematology & Oncology

## 2017-12-25 ENCOUNTER — Inpatient Hospital Stay: Payer: Medicare Other

## 2017-12-25 ENCOUNTER — Other Ambulatory Visit: Payer: Self-pay

## 2017-12-25 VITALS — BP 134/74 | HR 90 | Temp 98.0°F | Resp 18 | Wt 191.0 lb

## 2017-12-25 DIAGNOSIS — Z9484 Stem cells transplant status: Secondary | ICD-10-CM | POA: Insufficient documentation

## 2017-12-25 DIAGNOSIS — Z95828 Presence of other vascular implants and grafts: Secondary | ICD-10-CM

## 2017-12-25 DIAGNOSIS — Z923 Personal history of irradiation: Secondary | ICD-10-CM | POA: Insufficient documentation

## 2017-12-25 DIAGNOSIS — R1012 Left upper quadrant pain: Secondary | ICD-10-CM | POA: Diagnosis not present

## 2017-12-25 DIAGNOSIS — C9001 Multiple myeloma in remission: Secondary | ICD-10-CM | POA: Insufficient documentation

## 2017-12-25 DIAGNOSIS — Z9221 Personal history of antineoplastic chemotherapy: Secondary | ICD-10-CM | POA: Diagnosis not present

## 2017-12-25 DIAGNOSIS — Z853 Personal history of malignant neoplasm of breast: Secondary | ICD-10-CM | POA: Insufficient documentation

## 2017-12-25 DIAGNOSIS — C50911 Malignant neoplasm of unspecified site of right female breast: Secondary | ICD-10-CM

## 2017-12-25 DIAGNOSIS — Z171 Estrogen receptor negative status [ER-]: Secondary | ICD-10-CM

## 2017-12-25 LAB — CMP (CANCER CENTER ONLY)
ALK PHOS: 90 U/L — AB (ref 26–84)
ALT: 32 U/L (ref 10–47)
ANION GAP: 13 (ref 5–15)
AST: 28 U/L (ref 11–38)
Albumin: 3.8 g/dL (ref 3.5–5.0)
BILIRUBIN TOTAL: 0.5 mg/dL (ref 0.2–1.6)
BUN: 20 mg/dL (ref 7–22)
CALCIUM: 9.7 mg/dL (ref 8.0–10.3)
CO2: 26 mmol/L (ref 18–33)
Chloride: 103 mmol/L (ref 98–108)
Creatinine: 1.5 mg/dL — ABNORMAL HIGH (ref 0.60–1.20)
Glucose, Bld: 184 mg/dL — ABNORMAL HIGH (ref 73–118)
Potassium: 4.2 mmol/L (ref 3.3–4.7)
Sodium: 142 mmol/L (ref 128–145)
Total Protein: 8.2 g/dL — ABNORMAL HIGH (ref 6.4–8.1)

## 2017-12-25 LAB — CBC WITH DIFFERENTIAL (CANCER CENTER ONLY)
BASOS ABS: 0 10*3/uL (ref 0.0–0.1)
Basophils Relative: 0 %
Eosinophils Absolute: 0.1 10*3/uL (ref 0.0–0.5)
Eosinophils Relative: 2 %
HEMATOCRIT: 38.4 % (ref 34.8–46.6)
Hemoglobin: 12.6 g/dL (ref 11.6–15.9)
LYMPHS PCT: 21 %
Lymphs Abs: 1 10*3/uL (ref 0.9–3.3)
MCH: 31.9 pg (ref 26.0–34.0)
MCHC: 32.8 g/dL (ref 32.0–36.0)
MCV: 97.2 fL (ref 81.0–101.0)
MONOS PCT: 8 %
Monocytes Absolute: 0.4 10*3/uL (ref 0.1–0.9)
NEUTROS ABS: 3.1 10*3/uL (ref 1.5–6.5)
Neutrophils Relative %: 69 %
Platelet Count: 270 10*3/uL (ref 145–400)
RBC: 3.95 MIL/uL (ref 3.70–5.32)
RDW: 13.2 % (ref 11.1–15.7)
WBC Count: 4.5 10*3/uL (ref 3.9–10.0)

## 2017-12-25 MED ORDER — SODIUM CHLORIDE 0.9% FLUSH
10.0000 mL | INTRAVENOUS | Status: DC | PRN
  Administered 2017-12-25: 10 mL via INTRAVENOUS
  Filled 2017-12-25: qty 10

## 2017-12-25 MED ORDER — HEPARIN SOD (PORK) LOCK FLUSH 100 UNIT/ML IV SOLN
500.0000 [IU] | Freq: Once | INTRAVENOUS | Status: AC
  Administered 2017-12-25: 500 [IU] via INTRAVENOUS
  Filled 2017-12-25: qty 5

## 2017-12-25 NOTE — Progress Notes (Signed)
Hematology and Oncology Follow Up Visit  [place] [id number] December 11, 1945 72 y.o. 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    Interim History:  [name] is here today for follow-up.  It sounds like she needs extensive oral surgery.  We will hold the Zometa today.  In fact, I will hold her Zometa until January 2020.  Thankfully, myeloma is not a real problem for her.  We have not found any evidence of recurrent disease.  As far as her breast cancer is concerned, she is doing okay with this.  She was complaining of some pain over in the left upper abdomen.  I do not know if this is the ribs.  This is been going on for about a week.  We will have to watch this closely.  She is had no fever.  She is had no dysuria.  There is been no hematuria.  She is had no change in bowel habits.  There is been no leg swelling.  She had her 72th birthday back in June.  Her son from [place] came into town to surprise her.  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  They are doing quite well despite the problem with honey bees.  She is due for a mammogram this week.  Overall, her performance status is ECOG 0.  Medications:  Allergies as of 12/25/2017      Reactions   Lisinopril Other (See Comments), Cough   Throat itching      Medication List        Accurate as of 12/25/17 11:47 AM. Always use your most recent med list.          amLODipine 5 MG tablet Commonly known as:  NORVASC Take 1 tablet (5 mg total) by mouth daily.   cholecalciferol 1000 units tablet Commonly known as:  VITAMIN  D Take 5,000 Units by mouth daily.   Vitamin D3 5000 units Caps Take 5,000 Units by mouth daily.   dexlansoprazole 60 MG capsule Commonly known as:  DEXILANT Take 1 capsule (60 mg total) by mouth daily.   metFORMIN 500 MG tablet Commonly known as:  GLUCOPHAGE Take 500 mg by mouth daily.   naproxen sodium 220 MG tablet Commonly known as:  ALEVE Take 440 mg by mouth daily as needed (FOR PAIN).       Allergies:  Allergies  Allergen Reactions  . Lisinopril Other (See Comments) and Cough    Throat itching    Past Medical History, Surgical history, Social history, and Family History were reviewed and updated.  Review of Systems: Review of Systems  Constitutional: Negative.   HENT: Negative.   Eyes: Negative.   Respiratory: Negative.   Cardiovascular: Negative.   Gastrointestinal: Negative.   Genitourinary: Negative.   Musculoskeletal: Negative.   Skin: Negative.   Neurological: Negative.   Endo/Heme/Allergies: Negative.   Psychiatric/Behavioral: Negative.       Physical Exam:  weight is 191 lb (86.6 kg). Her oral temperature is 98 F (36.7 C). Her blood pressure is 134/74 and her pulse is 90. Her respiration is 18 and oxygen saturation is  98%.   Wt Readings from Last 3 Encounters:  12/25/17 191 lb (86.6 kg)  11/08/17 191 lb (86.6 kg)  10/24/17 191 lb (86.6 kg)    Physical Exam  Constitutional: She is oriented to person, place, and time.  HENT:  Head: Normocephalic and atraumatic.  Mouth/Throat: Oropharynx is clear and moist.  Eyes: Pupils are equal, round, and reactive to light. EOM are normal.  Neck: Normal range of motion.  Cardiovascular: Normal rate, regular rhythm and normal heart sounds.  Pulmonary/Chest: Effort normal and breath sounds normal.  Abdominal: Soft. Bowel sounds are normal.  Musculoskeletal: Normal range of motion. She exhibits no edema, tenderness or deformity.  Lymphadenopathy:    She has no cervical adenopathy.  Neurological: She  is alert and oriented to person, place, and time.  Skin: Skin is warm and dry. No rash noted. No erythema.  Psychiatric: She has a normal mood and affect. Her behavior is normal. Judgment and thought content normal.  Vitals reviewed.  Lab Results  Component Value Date   WBC 4.5 12/25/2017   HGB 12.6 12/25/2017   HCT 38.4 12/25/2017   MCV 97.2 12/25/2017   PLT 270 12/25/2017   Lab Results  Component Value Date   FERRITIN 2,980 (H) 12/01/2015   IRON 26 (L) 12/01/2015   TIBC 143 (L) 12/01/2015   UIBC 117 12/01/2015   IRONPCTSAT 18 12/01/2015   Lab Results  Component Value Date   RETICCTPCT <0.4 (L) 12/01/2015   RBC 3.95 12/25/2017   Lab Results  Component Value Date   KPAFRELGTCHN 34.4 (H) 06/26/2017   LAMBDASER 17.0 06/26/2017   KAPLAMBRATIO 2.02 (H) 06/26/2017   Lab Results  Component Value Date   IGGSERUM 1,242 06/26/2017   IGA 314 06/26/2017   IGMSERUM 104 06/26/2017   Lab Results  Component Value Date   TOTALPROTELP 7.1 06/26/2017   ALBUMINELP 3.4 06/26/2017   A1GS 0.2 06/26/2017   A2GS 0.9 06/26/2017   BETS 1.2 06/26/2017   BETA2SER 0.5 03/08/2015   GAMS 1.3 06/26/2017   MSPIKE Not Observed 06/26/2017   SPEI * 03/08/2015     Chemistry      Component Value Date/Time   NA 142 12/25/2017 0935   NA 141 11/28/2016 0908   NA 142 10/22/2015 0950   K 4.2 12/25/2017 0935   K 4.0 11/28/2016 0908   K 4.2 10/22/2015 0950   CL 103 12/25/2017 0935   CL 106 11/28/2016 0908   CO2 26 12/25/2017 0935   CO2 26 11/28/2016 0908   CO2 24 10/22/2015 0950   BUN 20 12/25/2017 0935   BUN 12 11/28/2016 0908   BUN 14.0 10/22/2015 0950   CREATININE 1.50 (H) 12/25/2017 0935   CREATININE 1.6 (H) 11/28/2016 0908   CREATININE 1.6 (H) 10/22/2015 0950      Component Value Date/Time   CALCIUM 9.7 12/25/2017 0935   CALCIUM 9.6 11/28/2016 0908   CALCIUM 9.4 10/22/2015 0950   ALKPHOS 90 (H) 12/25/2017 0935   ALKPHOS 89 (H) 11/28/2016 0908   ALKPHOS 79 10/22/2015 0950   AST  28 12/25/2017 0935   AST 35 (H) 10/22/2015 0950   ALT 32 12/25/2017 0935   ALT 40 11/28/2016 0908   ALT 41 10/22/2015 0950   BILITOT 0.5 12/25/2017 0935   BILITOT 0.30 10/22/2015 0950     Impression and Plan: [name] is a 72 yo African American female with IgG Kappa myeloma - remission s/p ASCT and also stage Ia triple negative breast cancer of the  right breast. She has completed chemo and radiation and continues to do quite well.   Given that she does have triple negative disease, I do think we have to look into this pain that she is having.  I will go ahead and get a bone scan on her to make sure that nothing is going on with her bones.  I would like to see her back in 2 months so that we can follow-up with her.    [name], MD 8/6/201911:47 AM

## 2017-12-26 ENCOUNTER — Telehealth: Payer: Self-pay | Admitting: *Deleted

## 2017-12-26 LAB — PROTEIN ELECTROPHORESIS, SERUM, WITH REFLEX
A/G Ratio: 1 (ref 0.7–1.7)
ALBUMIN ELP: 3.7 g/dL (ref 2.9–4.4)
ALPHA-1-GLOBULIN: 0.2 g/dL (ref 0.0–0.4)
Alpha-2-Globulin: 1 g/dL (ref 0.4–1.0)
Beta Globulin: 1.3 g/dL (ref 0.7–1.3)
Gamma Globulin: 1.2 g/dL (ref 0.4–1.8)
Globulin, Total: 3.7 g/dL (ref 2.2–3.9)
TOTAL PROTEIN ELP: 7.4 g/dL (ref 6.0–8.5)

## 2017-12-26 LAB — KAPPA/LAMBDA LIGHT CHAINS
Kappa free light chain: 39.5 mg/L — ABNORMAL HIGH (ref 3.3–19.4)
Kappa, lambda light chain ratio: 2.07 — ABNORMAL HIGH (ref 0.26–1.65)
Lambda free light chains: 19.1 mg/L (ref 5.7–26.3)

## 2017-12-26 LAB — IGG, IGA, IGM
IgA: 328 mg/dL (ref 87–352)
IgG (Immunoglobin G), Serum: 1282 mg/dL (ref 700–1600)
IgM (Immunoglobulin M), Srm: 107 mg/dL (ref 26–217)

## 2017-12-26 NOTE — Telephone Encounter (Addendum)
Patient is aware of results  ----- Message from [name], MD sent at 12/26/2017  3:56 PM EDT ----- Call - NO myeloma!!  Laurey Arrow

## 2017-12-28 ENCOUNTER — Ambulatory Visit
Admission: RE | Admit: 2017-12-28 | Discharge: 2017-12-28 | Disposition: A | Discharge status: Home / Self Care | Payer: Medicare Other | Source: Ambulatory Visit | Attending: Hematology & Oncology | Admitting: Hematology & Oncology

## 2017-12-28 DIAGNOSIS — Z853 Personal history of malignant neoplasm of breast: Secondary | ICD-10-CM

## 2018-01-14 ENCOUNTER — Encounter (HOSPITAL_COMMUNITY): Payer: Self-pay

## 2018-01-14 ENCOUNTER — Other Ambulatory Visit (HOSPITAL_COMMUNITY): Payer: Self-pay

## 2018-01-22 ENCOUNTER — Encounter (HOSPITAL_COMMUNITY): Payer: Medicare Other

## 2018-01-22 ENCOUNTER — Ambulatory Visit (HOSPITAL_COMMUNITY): Payer: Medicare Other

## 2018-01-30 ENCOUNTER — Ambulatory Visit (HOSPITAL_COMMUNITY)
Admission: RE | Admit: 2018-01-30 | Discharge: 2018-01-30 | Disposition: A | Discharge status: Home / Self Care | Payer: Medicare Other | Source: Ambulatory Visit | Attending: Hematology & Oncology | Admitting: Hematology & Oncology

## 2018-01-30 ENCOUNTER — Encounter (HOSPITAL_COMMUNITY)
Admission: RE | Admit: 2018-01-30 | Discharge: 2018-01-30 | Disposition: A | Discharge status: Home / Self Care | Payer: Medicare Other | Source: Ambulatory Visit | Attending: Hematology & Oncology | Admitting: Hematology & Oncology

## 2018-01-30 DIAGNOSIS — C50911 Malignant neoplasm of unspecified site of right female breast: Secondary | ICD-10-CM | POA: Diagnosis present

## 2018-01-30 DIAGNOSIS — Z171 Estrogen receptor negative status [ER-]: Secondary | ICD-10-CM | POA: Diagnosis present

## 2018-01-30 MED ORDER — TECHNETIUM TC 99M MEDRONATE IV KIT
20.4000 | PACK | Freq: Once | INTRAVENOUS | Status: AC | PRN
  Administered 2018-01-30: 20.4 via INTRAVENOUS

## 2018-02-07 ENCOUNTER — Telehealth: Payer: Self-pay | Admitting: *Deleted

## 2018-02-07 NOTE — Telephone Encounter (Addendum)
Patient is aware of results  ----- Message from [name], MD sent at 02/07/2018  6:55 AM EDT ----- Call - no obvious bone cancer or myeloma in the bones!!! pete

## 2018-03-05 ENCOUNTER — Inpatient Hospital Stay (HOSPITAL_BASED_OUTPATIENT_CLINIC_OR_DEPARTMENT_OTHER): Payer: Medicare Other | Admitting: Hematology & Oncology

## 2018-03-05 ENCOUNTER — Inpatient Hospital Stay: Payer: Medicare Other | Attending: Hematology & Oncology

## 2018-03-05 ENCOUNTER — Inpatient Hospital Stay: Payer: Medicare Other

## 2018-03-05 ENCOUNTER — Other Ambulatory Visit: Payer: Self-pay

## 2018-03-05 VITALS — BP 166/84 | HR 72 | Temp 97.7°F | Resp 20 | Wt 194.5 lb

## 2018-03-05 DIAGNOSIS — C9001 Multiple myeloma in remission: Secondary | ICD-10-CM

## 2018-03-05 DIAGNOSIS — Z923 Personal history of irradiation: Secondary | ICD-10-CM | POA: Diagnosis not present

## 2018-03-05 DIAGNOSIS — Z171 Estrogen receptor negative status [ER-]: Principal | ICD-10-CM

## 2018-03-05 DIAGNOSIS — Z9484 Stem cells transplant status: Secondary | ICD-10-CM | POA: Insufficient documentation

## 2018-03-05 DIAGNOSIS — C50911 Malignant neoplasm of unspecified site of right female breast: Secondary | ICD-10-CM

## 2018-03-05 DIAGNOSIS — Z95828 Presence of other vascular implants and grafts: Secondary | ICD-10-CM

## 2018-03-05 DIAGNOSIS — Z853 Personal history of malignant neoplasm of breast: Secondary | ICD-10-CM | POA: Insufficient documentation

## 2018-03-05 DIAGNOSIS — Z9221 Personal history of antineoplastic chemotherapy: Secondary | ICD-10-CM

## 2018-03-05 LAB — CMP (CANCER CENTER ONLY)
ALT: 35 U/L (ref 10–47)
ANION GAP: 3 — AB (ref 5–15)
AST: 28 U/L (ref 11–38)
Albumin: 3.7 g/dL (ref 3.5–5.0)
Alkaline Phosphatase: 80 U/L (ref 26–84)
BUN: 13 mg/dL (ref 7–22)
CALCIUM: 9.6 mg/dL (ref 8.0–10.3)
CO2: 26 mmol/L (ref 18–33)
Chloride: 111 mmol/L — ABNORMAL HIGH (ref 98–108)
Creatinine: 1.3 mg/dL — ABNORMAL HIGH (ref 0.60–1.20)
GLUCOSE: 122 mg/dL — AB (ref 73–118)
Potassium: 4.3 mmol/L (ref 3.3–4.7)
SODIUM: 140 mmol/L (ref 128–145)
TOTAL PROTEIN: 7.7 g/dL (ref 6.4–8.1)
Total Bilirubin: 0.4 mg/dL (ref 0.2–1.6)

## 2018-03-05 LAB — CBC WITH DIFFERENTIAL (CANCER CENTER ONLY)
ABS IMMATURE GRANULOCYTES: 0.01 10*3/uL (ref 0.00–0.07)
BASOS PCT: 1 %
Basophils Absolute: 0 10*3/uL (ref 0.0–0.1)
EOS ABS: 0.2 10*3/uL (ref 0.0–0.5)
Eosinophils Relative: 3 %
HCT: 38.5 % (ref 36.0–46.0)
Hemoglobin: 11.9 g/dL — ABNORMAL LOW (ref 12.0–15.0)
IMMATURE GRANULOCYTES: 0 %
Lymphocytes Relative: 33 %
Lymphs Abs: 1.5 10*3/uL (ref 0.7–4.0)
MCH: 30.7 pg (ref 26.0–34.0)
MCHC: 30.9 g/dL (ref 30.0–36.0)
MCV: 99.2 fL (ref 80.0–100.0)
Monocytes Absolute: 0.6 10*3/uL (ref 0.1–1.0)
Monocytes Relative: 13 %
NEUTROS ABS: 2.3 10*3/uL (ref 1.7–7.7)
Neutrophils Relative %: 50 %
PLATELETS: 298 10*3/uL (ref 150–400)
RBC: 3.88 MIL/uL (ref 3.87–5.11)
RDW: 13.6 % (ref 11.5–15.5)
WBC Count: 4.5 10*3/uL (ref 4.0–10.5)
nRBC: 0 % (ref 0.0–0.2)

## 2018-03-05 MED ORDER — HEPARIN SOD (PORK) LOCK FLUSH 100 UNIT/ML IV SOLN
500.0000 [IU] | Freq: Once | INTRAVENOUS | Status: AC
  Administered 2018-03-05: 500 [IU] via INTRAVENOUS
  Filled 2018-03-05: qty 5

## 2018-03-05 MED ORDER — SODIUM CHLORIDE 0.9% FLUSH
10.0000 mL | INTRAVENOUS | Status: DC | PRN
  Administered 2018-03-05: 10 mL via INTRAVENOUS
  Filled 2018-03-05: qty 10

## 2018-03-05 NOTE — Patient Instructions (Signed)
Implanted Port Insertion, Care After °This sheet gives you information about how to care for yourself after your procedure. Your health care provider may also give you more specific instructions. If you have problems or questions, contact your health care provider. °What can I expect after the procedure? °After your procedure, it is common to have: °· Discomfort at the port insertion site. °· Bruising on the skin over the port. This should improve over 3-4 days. ° °Follow these instructions at home: °Port care °· After your port is placed, you will get a manufacturer's information card. The card has information about your port. Keep this card with you at all times. °· Take care of the port as told by your health care provider. Ask your health care provider if you or a family member can get training for taking care of the port at home. A home health care nurse may also take care of the port. °· Make sure to remember what type of port you have. °Incision care °· Follow instructions from your health care provider about how to take care of your port insertion site. Make sure you: °? Wash your hands with soap and water before you change your bandage (dressing). If soap and water are not available, use hand sanitizer. °? Change your dressing as told by your health care provider. °? Leave stitches (sutures), skin glue, or adhesive strips in place. These skin closures may need to stay in place for 2 weeks or longer. If adhesive strip edges start to loosen and curl up, you may trim the loose edges. Do not remove adhesive strips completely unless your health care provider tells you to do that. °· Check your port insertion site every day for signs of infection. Check for: °? More redness, swelling, or pain. °? More fluid or blood. °? Warmth. °? Pus or a bad smell. °General instructions °· Do not take baths, swim, or use a hot tub until your health care provider approves. °· Do not lift anything that is heavier than 10 lb (4.5  kg) for a week, or as told by your health care provider. °· Ask your health care provider when it is okay to: °? Return to work or school. °? Resume usual physical activities or sports. °· Do not drive for 24 hours if you were given a medicine to help you relax (sedative). °· Take over-the-counter and prescription medicines only as told by your health care provider. °· Wear a medical alert bracelet in case of an emergency. This will tell any health care providers that you have a port. °· Keep all follow-up visits as told by your health care provider. This is important. °Contact a health care provider if: °· You cannot flush your port with saline as directed, or you cannot draw blood from the port. °· You have a fever or chills. °· You have more redness, swelling, or pain around your port insertion site. °· You have more fluid or blood coming from your port insertion site. °· Your port insertion site feels warm to the touch. °· You have pus or a bad smell coming from the port insertion site. °Get help right away if: °· You have chest pain or shortness of breath. °· You have bleeding from your port that you cannot control. °Summary °· Take care of the port as told by your health care provider. °· Change your dressing as told by your health care provider. °· Keep all follow-up visits as told by your health care provider. °  This information is not intended to replace advice given to you by your health care provider. Make sure you discuss any questions you have with your health care provider. °Document Released: 02/26/2013 Document Revised: 03/29/2016 Document Reviewed: 03/29/2016 °Elsevier Interactive Patient Education © 2017 Elsevier Inc. ° °

## 2018-03-05 NOTE — Progress Notes (Signed)
Hematology and Oncology Follow Up Visit  [facility] [id number] 04/20/1946 72 y.o. 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 Radiation therapy to the right breast - completed in December 2017 Status post autologous stem cell transplant for myeloma at [facility] in October 2006.    Interim History:  Ms. Schickling is here today for follow-up.  She is tired.  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  She drives 4 hours in the morning and 4 hours in the afternoon.  She is had no issues with pain.  She is having issues with dental work.  I think we should probably just stop the Zometa at this point.  Her myeloma has not been a problem.  Back in August points are, there is no monoclonal spike in her blood.  Her IgG level was 1282 mg/dL.  Her kappa light chain was 4 mg/dL.  She has had no change in bowel or bladder habits.  There is been no leg swelling.  Her weight has been going up.  I am not too happy about that.  She is trying to watch it.  Overall, her performance status is ECOG 0.  Medications:  Allergies as of 03/05/2018      Reactions   Lisinopril Other (See Comments), Cough   Throat itching      Medication List        Accurate as of 03/05/18 11:00 AM. Always use your most recent med list.          acetaminophen 325 MG tablet Commonly known as:  TYLENOL Take 650 mg by mouth every 6 (six) hours as needed.   amLODipine 5 MG tablet Commonly known as:  NORVASC Take 1 tablet (5 mg total) by mouth daily.   dexlansoprazole 60 MG capsule Commonly known as:  DEXILANT Take 1 capsule (60 mg total) by mouth daily.   metFORMIN 500 MG  tablet Commonly known as:  GLUCOPHAGE Take 500 mg by mouth daily.   Vitamin D3 5000 units Caps Take 5,000 Units by mouth daily.       Allergies:  Allergies  Allergen Reactions  . Lisinopril Other (See Comments) and Cough    Throat itching    Past Medical History, Surgical history, Social history, and Family History were reviewed and updated.  Review of Systems: Review of Systems  Constitutional: Negative.   HENT: Negative.   Eyes: Negative.   Respiratory: Negative.   Cardiovascular: Negative.   Gastrointestinal: Negative.   Genitourinary: Negative.   Musculoskeletal: Negative.   Skin: Negative.   Neurological: Negative.   Endo/Heme/Allergies: Negative.   Psychiatric/Behavioral: Negative.       Physical Exam:  weight is 194 lb 8 oz (88.2 kg). Her oral temperature is 97.7 F (36.5 C). Her blood pressure is 166/84 (abnormal) and her pulse is 72. Her respiration is 20 and oxygen saturation is 100%.   Wt Readings from Last 3 Encounters:  03/05/18 194 lb 8 oz (88.2 kg)  12/25/17 191 lb (86.6 kg)  11/08/17 191 lb (86.6 kg)    Physical Exam  Constitutional: She is oriented to person, place, and time.  HENT:  Head: Normocephalic and  atraumatic.  Mouth/Throat: Oropharynx is clear and moist.  Eyes: Pupils are equal, round, and reactive to light. EOM are normal.  Neck: Normal range of motion.  Cardiovascular: Normal rate, regular rhythm and normal heart sounds.  Pulmonary/Chest: Effort normal and breath sounds normal.  Abdominal: Soft. Bowel sounds are normal.  Musculoskeletal: Normal range of motion. She exhibits no edema, tenderness or deformity.  Lymphadenopathy:    She has no cervical adenopathy.  Neurological: She is alert and oriented to person, place, and time.  Skin: Skin is warm and dry. No rash noted. No erythema.  Psychiatric: She has a normal mood and affect. Her behavior is normal. Judgment and thought content normal.  Vitals reviewed.  Lab Results   Component Value Date   WBC 4.5 03/05/2018   HGB 11.9 (L) 03/05/2018   HCT 38.5 03/05/2018   MCV 99.2 03/05/2018   PLT 298 03/05/2018   Lab Results  Component Value Date   FERRITIN 2,980 (H) 12/01/2015   IRON 26 (L) 12/01/2015   TIBC 143 (L) 12/01/2015   UIBC 117 12/01/2015   IRONPCTSAT 18 12/01/2015   Lab Results  Component Value Date   RETICCTPCT <0.4 (L) 12/01/2015   RBC 3.88 03/05/2018   Lab Results  Component Value Date   KPAFRELGTCHN 39.5 (H) 12/25/2017   LAMBDASER 19.1 12/25/2017   KAPLAMBRATIO 2.07 (H) 12/25/2017   Lab Results  Component Value Date   IGGSERUM 1,282 12/25/2017   IGA 328 12/25/2017   IGMSERUM 107 12/25/2017   Lab Results  Component Value Date   TOTALPROTELP 7.4 12/25/2017   ALBUMINELP 3.7 12/25/2017   A1GS 0.2 12/25/2017   A2GS 1.0 12/25/2017   BETS 1.3 12/25/2017   BETA2SER 0.5 03/08/2015   GAMS 1.2 12/25/2017   MSPIKE Not Observed 12/25/2017   SPEI * 03/08/2015     Chemistry      Component Value Date/Time   NA 140 03/05/2018 0949   NA 141 11/28/2016 0908   NA 142 10/22/2015 0950   K 4.3 03/05/2018 0949   K 4.0 11/28/2016 0908   K 4.2 10/22/2015 0950   CL 111 (H) 03/05/2018 0949   CL 106 11/28/2016 0908   CO2 26 03/05/2018 0949   CO2 26 11/28/2016 0908   CO2 24 10/22/2015 0950   BUN 13 03/05/2018 0949   BUN 12 11/28/2016 0908   BUN 14.0 10/22/2015 0950   CREATININE 1.30 (H) 03/05/2018 0949   CREATININE 1.6 (H) 11/28/2016 0908   CREATININE 1.6 (H) 10/22/2015 0950      Component Value Date/Time   CALCIUM 9.6 03/05/2018 0949   CALCIUM 9.6 11/28/2016 0908   CALCIUM 9.4 10/22/2015 0950   ALKPHOS 80 03/05/2018 0949   ALKPHOS 89 (H) 11/28/2016 0908   ALKPHOS 79 10/22/2015 0950   AST 28 03/05/2018 0949   AST 35 (H) 10/22/2015 0950   ALT 35 03/05/2018 0949   ALT 40 11/28/2016 0908   ALT 41 10/22/2015 0950   BILITOT 0.4 03/05/2018 0949   BILITOT 0.30 10/22/2015 0950     Impression and Plan: Ms. Danko is a 72 yo African  American female with IgG Kappa myeloma - remission s/p ASCT and also stage Ia triple negative breast cancer of the right breast. She has completed chemo and radiation and continues to do quite well.   Again, with respect to her breast cancer, everything is still doing quite well.  I do not see any evidence of recurrent disease to date.  Is now is been a  little over 2 years that she completed chemotherapy.  Again, we will hold her Zometa for now.  I think we should probably just stop it.  She is having dental issues and I do not want Zometa to be a problem with this.  We will plan to get her back to see [place] in another 4 months.  We need to make sure that she has her Port-A-Cath flushed in 2 months.   [name], MD 10/15/201911:00 AM

## 2018-03-06 LAB — CANCER ANTIGEN 27.29: CAN 27.29: 45.1 U/mL — AB (ref 0.0–38.6)

## 2018-05-07 ENCOUNTER — Inpatient Hospital Stay: Payer: Medicare Other | Attending: Hematology & Oncology

## 2018-05-07 ENCOUNTER — Other Ambulatory Visit: Payer: Self-pay

## 2018-05-07 VITALS — BP 187/69 | HR 90 | Temp 98.4°F

## 2018-05-07 DIAGNOSIS — Z452 Encounter for adjustment and management of vascular access device: Secondary | ICD-10-CM | POA: Diagnosis not present

## 2018-05-07 DIAGNOSIS — C9001 Multiple myeloma in remission: Secondary | ICD-10-CM | POA: Diagnosis present

## 2018-05-07 DIAGNOSIS — Z95828 Presence of other vascular implants and grafts: Secondary | ICD-10-CM

## 2018-05-07 MED ORDER — SODIUM CHLORIDE 0.9% FLUSH
10.0000 mL | INTRAVENOUS | Status: DC | PRN
  Administered 2018-05-07: 10 mL via INTRAVENOUS
  Filled 2018-05-07: qty 10

## 2018-05-07 MED ORDER — HEPARIN SOD (PORK) LOCK FLUSH 100 UNIT/ML IV SOLN
500.0000 [IU] | Freq: Once | INTRAVENOUS | Status: AC
  Administered 2018-05-07: 500 [IU] via INTRAVENOUS
  Filled 2018-05-07: qty 5

## 2018-05-07 NOTE — Patient Instructions (Signed)
Implanted Port Insertion, Care After °This sheet gives you information about how to care for yourself after your procedure. Your health care provider may also give you more specific instructions. If you have problems or questions, contact your health care provider. °What can I expect after the procedure? °After your procedure, it is common to have: °· Discomfort at the port insertion site. °· Bruising on the skin over the port. This should improve over 3-4 days. ° °Follow these instructions at home: °Port care °· After your port is placed, you will get a manufacturer's information card. The card has information about your port. Keep this card with you at all times. °· Take care of the port as told by your health care provider. Ask your health care provider if you or a family member can get training for taking care of the port at home. A home health care nurse may also take care of the port. °· Make sure to remember what type of port you have. °Incision care °· Follow instructions from your health care provider about how to take care of your port insertion site. Make sure you: °? Wash your hands with soap and water before you change your bandage (dressing). If soap and water are not available, use hand sanitizer. °? Change your dressing as told by your health care provider. °? Leave stitches (sutures), skin glue, or adhesive strips in place. These skin closures may need to stay in place for 2 weeks or longer. If adhesive strip edges start to loosen and curl up, you may trim the loose edges. Do not remove adhesive strips completely unless your health care provider tells you to do that. °· Check your port insertion site every day for signs of infection. Check for: °? More redness, swelling, or pain. °? More fluid or blood. °? Warmth. °? Pus or a bad smell. °General instructions °· Do not take baths, swim, or use a hot tub until your health care provider approves. °· Do not lift anything that is heavier than 10 lb (4.5  kg) for a week, or as told by your health care provider. °· Ask your health care provider when it is okay to: °? Return to work or school. °? Resume usual physical activities or sports. °· Do not drive for 24 hours if you were given a medicine to help you relax (sedative). °· Take over-the-counter and prescription medicines only as told by your health care provider. °· Wear a medical alert bracelet in case of an emergency. This will tell any health care providers that you have a port. °· Keep all follow-up visits as told by your health care provider. This is important. °Contact a health care provider if: °· You cannot flush your port with saline as directed, or you cannot draw blood from the port. °· You have a fever or chills. °· You have more redness, swelling, or pain around your port insertion site. °· You have more fluid or blood coming from your port insertion site. °· Your port insertion site feels warm to the touch. °· You have pus or a bad smell coming from the port insertion site. °Get help right away if: °· You have chest pain or shortness of breath. °· You have bleeding from your port that you cannot control. °Summary °· Take care of the port as told by your health care provider. °· Change your dressing as told by your health care provider. °· Keep all follow-up visits as told by your health care provider. °  This information is not intended to replace advice given to you by your health care provider. Make sure you discuss any questions you have with your health care provider. °Document Released: 02/26/2013 Document Revised: 03/29/2016 Document Reviewed: 03/29/2016 °Elsevier Interactive Patient Education © 2017 Elsevier Inc. ° °

## 2018-05-09 ENCOUNTER — Other Ambulatory Visit: Payer: Self-pay | Admitting: Family

## 2018-05-09 DIAGNOSIS — C9001 Multiple myeloma in remission: Secondary | ICD-10-CM

## 2018-05-09 DIAGNOSIS — Z95828 Presence of other vascular implants and grafts: Secondary | ICD-10-CM

## 2018-05-21 ENCOUNTER — Other Ambulatory Visit: Payer: Self-pay | Admitting: Radiology

## 2018-05-23 ENCOUNTER — Ambulatory Visit (HOSPITAL_COMMUNITY)
Admission: RE | Admit: 2018-05-23 | Discharge: 2018-05-23 | Disposition: A | Discharge status: Home / Self Care | Payer: Medicare Other | Source: Ambulatory Visit | Attending: Family | Admitting: Family

## 2018-05-23 ENCOUNTER — Encounter (HOSPITAL_COMMUNITY): Payer: Self-pay

## 2018-05-23 DIAGNOSIS — Z79899 Other long term (current) drug therapy: Secondary | ICD-10-CM | POA: Insufficient documentation

## 2018-05-23 DIAGNOSIS — Z7984 Long term (current) use of oral hypoglycemic drugs: Secondary | ICD-10-CM | POA: Insufficient documentation

## 2018-05-23 DIAGNOSIS — K219 Gastro-esophageal reflux disease without esophagitis: Secondary | ICD-10-CM | POA: Insufficient documentation

## 2018-05-23 DIAGNOSIS — E785 Hyperlipidemia, unspecified: Secondary | ICD-10-CM | POA: Insufficient documentation

## 2018-05-23 DIAGNOSIS — I10 Essential (primary) hypertension: Secondary | ICD-10-CM | POA: Diagnosis not present

## 2018-05-23 DIAGNOSIS — Z853 Personal history of malignant neoplasm of breast: Secondary | ICD-10-CM | POA: Diagnosis not present

## 2018-05-23 DIAGNOSIS — M1990 Unspecified osteoarthritis, unspecified site: Secondary | ICD-10-CM | POA: Diagnosis not present

## 2018-05-23 DIAGNOSIS — C9001 Multiple myeloma in remission: Secondary | ICD-10-CM | POA: Insufficient documentation

## 2018-05-23 DIAGNOSIS — E119 Type 2 diabetes mellitus without complications: Secondary | ICD-10-CM | POA: Insufficient documentation

## 2018-05-23 DIAGNOSIS — Z452 Encounter for adjustment and management of vascular access device: Secondary | ICD-10-CM | POA: Diagnosis present

## 2018-05-23 DIAGNOSIS — E559 Vitamin D deficiency, unspecified: Secondary | ICD-10-CM | POA: Diagnosis not present

## 2018-05-23 DIAGNOSIS — Z888 Allergy status to other drugs, medicaments and biological substances status: Secondary | ICD-10-CM | POA: Insufficient documentation

## 2018-05-23 HISTORY — PX: IR REMOVAL TUN ACCESS W/ PORT W/O FL MOD SED: IMG2290

## 2018-05-23 LAB — BASIC METABOLIC PANEL
Anion gap: 10 (ref 5–15)
BUN: 17 mg/dL (ref 8–23)
CO2: 25 mmol/L (ref 22–32)
Calcium: 9.4 mg/dL (ref 8.9–10.3)
Chloride: 107 mmol/L (ref 98–111)
Creatinine, Ser: 1.41 mg/dL — ABNORMAL HIGH (ref 0.44–1.00)
GFR calc non Af Amer: 38 mL/min — ABNORMAL LOW (ref >60)
GFR, EST AFRICAN AMERICAN: 44 mL/min — AB (ref >60)
Glucose, Bld: 99 mg/dL (ref 70–99)
Potassium: 4.2 mmol/L (ref 3.5–5.1)
Sodium: 142 mmol/L (ref 135–145)

## 2018-05-23 LAB — CBC WITH DIFFERENTIAL/PLATELET
Abs Immature Granulocytes: 0.02 10*3/uL (ref 0.00–0.07)
Basophils Absolute: 0 10*3/uL (ref 0.0–0.1)
Basophils Relative: 0 %
Eosinophils Absolute: 0.1 10*3/uL (ref 0.0–0.5)
Eosinophils Relative: 3 %
HEMATOCRIT: 40.9 % (ref 36.0–46.0)
Hemoglobin: 12.8 g/dL (ref 12.0–15.0)
Immature Granulocytes: 0 %
Lymphocytes Relative: 30 %
Lymphs Abs: 1.4 10*3/uL (ref 0.7–4.0)
MCH: 31.7 pg (ref 26.0–34.0)
MCHC: 31.3 g/dL (ref 30.0–36.0)
MCV: 101.2 fL — ABNORMAL HIGH (ref 80.0–100.0)
Monocytes Absolute: 0.5 10*3/uL (ref 0.1–1.0)
Monocytes Relative: 10 %
Neutro Abs: 2.7 10*3/uL (ref 1.7–7.7)
Neutrophils Relative %: 57 %
Platelets: 291 10*3/uL (ref 150–400)
RBC: 4.04 MIL/uL (ref 3.87–5.11)
RDW: 13.5 % (ref 11.5–15.5)
WBC: 4.8 10*3/uL (ref 4.0–10.5)
nRBC: 0 % (ref 0.0–0.2)

## 2018-05-23 LAB — PROTIME-INR
INR: 0.88
Prothrombin Time: 11.9 seconds (ref 11.4–15.2)

## 2018-05-23 LAB — GLUCOSE, CAPILLARY: Glucose-Capillary: 92 mg/dL (ref 70–99)

## 2018-05-23 MED ORDER — MIDAZOLAM HCL 2 MG/2ML IJ SOLN
INTRAMUSCULAR | Status: AC
  Filled 2018-05-23: qty 4

## 2018-05-23 MED ORDER — NALOXONE HCL 0.4 MG/ML IJ SOLN
INTRAMUSCULAR | Status: AC
  Filled 2018-05-23: qty 1

## 2018-05-23 MED ORDER — CEFAZOLIN SODIUM-DEXTROSE 2-4 GM/100ML-% IV SOLN
INTRAVENOUS | Status: AC
  Administered 2018-05-23: 2 g via INTRAVENOUS
  Filled 2018-05-23: qty 100

## 2018-05-23 MED ORDER — SODIUM CHLORIDE 0.9 % IV SOLN
INTRAVENOUS | Status: DC
  Administered 2018-05-23: 12:00:00 via INTRAVENOUS

## 2018-05-23 MED ORDER — FENTANYL CITRATE (PF) 100 MCG/2ML IJ SOLN
INTRAMUSCULAR | Status: AC
  Filled 2018-05-23: qty 4

## 2018-05-23 MED ORDER — FENTANYL CITRATE (PF) 100 MCG/2ML IJ SOLN
INTRAMUSCULAR | Status: AC | PRN
  Administered 2018-05-23 (×2): 50 ug via INTRAVENOUS

## 2018-05-23 MED ORDER — CEFAZOLIN SODIUM-DEXTROSE 2-4 GM/100ML-% IV SOLN
2.0000 g | INTRAVENOUS | Status: AC
  Administered 2018-05-23: 2 g via INTRAVENOUS

## 2018-05-23 MED ORDER — HYDROCODONE-ACETAMINOPHEN 5-325 MG PO TABS: 1.0000 | ORAL_TABLET | ORAL | Status: DC | PRN

## 2018-05-23 MED ORDER — FLUMAZENIL 0.5 MG/5ML IV SOLN
INTRAVENOUS | Status: AC
  Filled 2018-05-23: qty 5

## 2018-05-23 MED ORDER — MIDAZOLAM HCL 2 MG/2ML IJ SOLN
INTRAMUSCULAR | Status: AC | PRN
  Administered 2018-05-23 (×2): 1 mg via INTRAVENOUS

## 2018-05-23 NOTE — Procedures (Signed)
Interventional Radiology Procedure:   Indications: Port is no longer needed  Procedure: Removal of left chest port  Findings: Complete removal of port  Complications: None     EBL: Minimal  Plan: Discharge to home in one hour.    Exzavier Ruderman R. Anselm Pancoast, MD  Pager: [phone]

## 2018-05-23 NOTE — H&P (Addendum)
Referring Physician(s): Ennever,P  Supervising Physician: Markus Daft  Patient Status:  [facility] OP  Chief Complaint: "I'm getting my port out"   Subjective: Patient familiar to IR service from prior right chest Port-A-Cath placement in 2006, since removed.  She has a history of both right breast cancer and multiple myeloma. She underwent surgical placement of left chest wall port a cath around 2017. She has completed treatment and her Port-A-Cath is no longer being used.  She presents today for Port-A-Cath removal.  She denies fever, headache, chest pain, dyspnea, cough, abdominal/back pain, nausea, vomiting or bleeding.  Past Medical History:  Diagnosis Date  . Anemia   . Arthralgia    chronic, 2nd degree to stem cell transplantation  . Breast cancer ([place])   . Breast cancer, stage 1 ([place]) 09/09/2015  . Chronic pain of left knee   . Degenerative joint disease involving multiple joints 03/29/2011  . Diabetes mellitus without complication [facility])    Patient reports she hasnt had Diabetes in years  . DJD (degenerative joint disease)   . E. coli gastroenteritis   . GERD (gastroesophageal reflux disease)   . Herpes simplex esophagitis 2006  . History of blood transfusion    with first pregnancy  . History of multiple myeloma    chemo XRT, Dr. Marin Olp  . History of radiation therapy 03/16/16-05/02/16   right breast 50.4 Gy in 28 fractions, boost to 60.4 Gy in 5 fractions  . 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 HTN (hypertension) 2005  . Hyperlipidemia   . Myeloma ([place]) 03/28/2011  . OA (osteoarthritis)   . Paget's bone disease   . Personal history of adenomatous colonic polyps 11/02/2011   2 diminutive serrated adenomas 10/2011 Repeat colonoscopy 2018 (change from original recall based upon current guidelines 12/07/2014)    . PONV (postoperative nausea and vomiting)   . Radiation 09/28/2006 - 10/11/2006   skull  treated to 1980 cGy by Dr. Elba Barman  . Renal insufficiency    H/O resolved  . Vitamin D deficiency    Past Surgical History:  Procedure Laterality Date  . BONE MARROW TRANSPLANT  01/2005   [facility], chemo tx at [place]-2006, radiation in 2008  . BREAST LUMPECTOMY Right 11/2015   radiation and chemo  . BREAST LUMPECTOMY WITH RADIOACTIVE SEED AND SENTINEL LYMPH NODE BIOPSY Right 09/22/2015   Procedure: BREAST LUMPECTOMY WITH RADIOACTIVE SEED AND SENTINEL LYMPH NODE BIOPSY;  Surgeon: Excell Seltzer, MD;  Location: [place];  Service: General;  Laterality: Right;  . COLONOSCOPY    . JOINT REPLACEMENT Left   . KNEE ARTHROSCOPY  05/2008   left  . TOTAL KNEE ARTHROPLASTY  10/11/2010   left      Allergies: Lisinopril  Medications: Prior to Admission medications   Medication Sig Start Date End Date Taking? Authorizing Provider  amLODipine (NORVASC) 5 MG tablet Take 1 tablet (5 mg total) by mouth daily. 11/28/16  Yes [name], MD  Cholecalciferol (VITAMIN D3) 5000 units CAPS Take 5,000 Units by mouth daily.   Yes [provider]  metFORMIN (GLUCOPHAGE) 500 MG tablet Take 500 mg by mouth daily.    Yes [provider]  acetaminophen (TYLENOL) 325 MG tablet Take 650 mg by mouth every 6 (six) hours as needed.    [provider]  dexlansoprazole (DEXILANT) 60 MG capsule Take 1 capsule (60 mg total) by mouth daily. 01/13/16   [place],  Holli Humbles, NP     Vital Signs: BP (!) 181/85   Pulse 92   Temp 98.4 F (36.9 C) (Oral)   Resp 16   Ht _0  (1.575 m)   Wt 175 lb (79.4 kg)   LMP 05/22/2000   SpO2 97%   BMI 32.01 kg/m   Physical Exam awake, alert.  Chest clear to auscultation bilaterally.  Clean, intact left chest wall Port-A-Cath.  Heart with regular rate and rhythm.  Abdomen soft, positive bowel sounds, nontender. Ext with FROM.  Imaging: No results found.  Labs:  CBC: Recent Labs    06/26/17 0940 12/25/17 0935 03/05/18 0949  05/23/18 1203  WBC 4.3 4.5 4.5 4.8  HGB 12.4 12.6 11.9* 12.8  HCT 37.9 38.4 38.5 40.9  PLT 270 270 298 291    COAGS: No results for input(s): INR, APTT in the last 8760 hours.  BMP: Recent Labs    06/26/17 0940 12/25/17 0935 03/05/18 0949  NA 141 142 140  K 4.3 4.2 4.3  CL 107 103 111*  CO2 _1 GLUCOSE 92 184* 122*  BUN _2 CALCIUM 9.5 9.7 9.6  CREATININE 1.44* 1.50* 1.30*  GFRNONAA 36*  --   --   GFRAA 42*  --   --     LIVER FUNCTION TESTS: Recent Labs    06/26/17 0940 12/25/17 0935 03/05/18 0949  BILITOT 0.3 0.5 0.4  AST _3 ALT 26 32 35  ALKPHOS 85 90* 80  PROT 7.7 8.2* 7.7  ALBUMIN 3.7 3.8 3.7    Assessment and Plan: Pt with history of both right breast cancer and multiple myeloma.  She has completed treatment and her surgically placed left chest wall Port-A-Cath is no longer being used.  She presents today for Port-A-Cath removal.  Details/risks of procedure, including but not limited to, internal bleeding, infection, injury to adjacent structures discussed with patient/family with their understanding and consent.   Electronically Signed: D. Rowe Robert, PA-C 05/23/2018, 12:17 PM   I spent a total of 20 minutes at the the patient's bedside AND on the patient's hospital floor or unit, greater than 50% of which was counseling/coordinating care for Port-A-Cath removal

## 2018-05-23 NOTE — Discharge Instructions (Signed)
Implanted Port Removal, Care After °This sheet gives you information about how to care for yourself after your procedure. Your health care provider may also give you more specific instructions. If you have problems or questions, contact your health care provider. °What can I expect after the procedure? °After the procedure, it is common to have: °· Soreness or pain near your incision. °· Some swelling or bruising near your incision. °Follow these instructions at home: °Medicines °· Take over-the-counter and prescription medicines only as told by your health care provider. °· If you were prescribed an antibiotic medicine, take it as told by your health care provider. Do not stop taking the antibiotic even if you start to feel better. °Bathing °· Do not take baths, swim, or use a hot tub until your health care provider approves. Ask your health care provider if you can take showers. You may only be allowed to take sponge baths. °Incision care ° °· Follow instructions from your health care provider about how to take care of your incision. Make sure you: °? Wash your hands with soap and water before you change your bandage (dressing). If soap and water are not available, use hand sanitizer. °? Change your dressing as told by your health care provider. °? Keep your dressing dry. °? Leave stitches (sutures), skin glue, or adhesive strips in place. These skin closures may need to stay in place for 2 weeks or longer. If adhesive strip edges start to loosen and curl up, you may trim the loose edges. Do not remove adhesive strips completely unless your health care provider tells you to do that. °· Check your incision area every day for signs of infection. Check for: °? More redness, swelling, or pain. °? More fluid or blood. °? Warmth. °? Pus or a bad smell. °Driving ° °· Do not drive for 24 hours if you were given a medicine to help you relax (sedative) during your procedure. °· If you did not receive a sedative, ask your  health care provider when it is safe to drive. °Activity °· Return to your normal activities as told by your health care provider. Ask your health care provider what activities are safe for you. °· Do not lift anything that is heavier than 10 lb (4.5 kg), or the limit that you are told, until your health care provider says that it is safe. °· Do not do activities that involve lifting your arms over your head. °General instructions °· Do not use any products that contain nicotine or tobacco, such as cigarettes and e-cigarettes. These can delay healing. If you need help quitting, ask your health care provider. °· Keep all follow-up visits as told by your health care provider. This is important. °Contact a health care provider if: °· You have more redness, swelling, or pain around your incision. °· You have more fluid or blood coming from your incision. °· Your incision feels warm to the touch. °· You have pus or a bad smell coming from your incision. °· You have pain that is not relieved by your pain medicine. °Get help right away if you have: °· A fever or chills. °· Chest pain. °· Difficulty breathing. °Summary °· After the procedure, it is common to have pain, soreness, swelling, or bruising near your incision. °· If you were prescribed an antibiotic medicine, take it as told by your health care provider. Do not stop taking the antibiotic even if you start to feel better. °· Do not drive for 24 hours   if you were given a sedative during your procedure. °· Return to your normal activities as told by your health care provider. Ask your health care provider what activities are safe for you. °This information is not intended to replace advice given to you by your health care provider. Make sure you discuss any questions you have with your health care provider. °Document Released: 04/19/2015 Document Revised: 06/21/2017 Document Reviewed: 06/21/2017 °Elsevier Interactive Patient Education © 2019 [employer] ° ° °Moderate Conscious Sedation, Adult, Care After °These instructions provide you with information about caring for yourself after your procedure. Your health care provider may also give you more specific instructions. Your treatment has been planned according to current medical practices, but problems sometimes occur. Call your health care provider if you have any problems or questions after your procedure. °What can I expect after the procedure? °After your procedure, it is common: °· To feel sleepy for several hours. °· To feel clumsy and have poor balance for several hours. °· To have poor judgment for several hours. °· To vomit if you eat too soon. °Follow these instructions at home: °For at least 24 hours after the procedure: ° °· Do not: °? Participate in activities where you could fall or become injured. °? Drive. °? Use heavy machinery. °? Drink alcohol. °? Take sleeping pills or medicines that cause drowsiness. °? Make important decisions or sign legal documents. °? Take care of children on your own. °· Rest. °Eating and drinking °· Follow the diet recommended by your health care provider. °· If you vomit: °? Drink water, juice, or soup when you can drink without vomiting. °? Make sure you have little or no nausea before eating solid foods. °General instructions °· Have a responsible adult stay with you until you are awake and alert. °· Take over-the-counter and prescription medicines only as told by your health care provider. °· If you smoke, do not smoke without supervision. °· Keep all follow-up visits as told by your health care provider. This is important. °Contact a health care provider if: °· You keep feeling nauseous or you keep vomiting. °· You feel light-headed. °· You develop a rash. °· You have a fever. °Get help right away if: °· You have trouble breathing. °This information is not intended to replace advice given to you by your health care provider. Make sure you discuss any questions  you have with your health care provider. °Document Released: 02/26/2013 Document Revised: 10/11/2015 Document Reviewed: 08/28/2015 °Elsevier Interactive Patient Education © 2019 [employer] ° °

## 2018-07-09 ENCOUNTER — Inpatient Hospital Stay: Payer: Medicare Other

## 2018-07-09 ENCOUNTER — Inpatient Hospital Stay: Payer: Medicare Other | Attending: Hematology & Oncology | Admitting: Hematology & Oncology

## 2018-07-09 ENCOUNTER — Telehealth: Payer: Self-pay | Admitting: Hematology & Oncology

## 2018-07-09 ENCOUNTER — Other Ambulatory Visit: Payer: Self-pay

## 2018-07-09 VITALS — BP 162/79 | HR 77 | Temp 97.9°F | Resp 20

## 2018-07-09 DIAGNOSIS — Z9221 Personal history of antineoplastic chemotherapy: Secondary | ICD-10-CM | POA: Insufficient documentation

## 2018-07-09 DIAGNOSIS — C9001 Multiple myeloma in remission: Secondary | ICD-10-CM | POA: Diagnosis present

## 2018-07-09 DIAGNOSIS — Z171 Estrogen receptor negative status [ER-]: Secondary | ICD-10-CM

## 2018-07-09 DIAGNOSIS — Z923 Personal history of irradiation: Secondary | ICD-10-CM | POA: Diagnosis not present

## 2018-07-09 DIAGNOSIS — Z853 Personal history of malignant neoplasm of breast: Secondary | ICD-10-CM | POA: Insufficient documentation

## 2018-07-09 DIAGNOSIS — C50911 Malignant neoplasm of unspecified site of right female breast: Secondary | ICD-10-CM

## 2018-07-09 LAB — CBC WITH DIFFERENTIAL (CANCER CENTER ONLY)
ABS IMMATURE GRANULOCYTES: 0.02 10*3/uL (ref 0.00–0.07)
Basophils Absolute: 0 10*3/uL (ref 0.0–0.1)
Basophils Relative: 1 %
Eosinophils Absolute: 0.1 10*3/uL (ref 0.0–0.5)
Eosinophils Relative: 3 %
HCT: 39.2 % (ref 36.0–46.0)
Hemoglobin: 12.5 g/dL (ref 12.0–15.0)
Immature Granulocytes: 0 %
Lymphocytes Relative: 32 %
Lymphs Abs: 1.7 10*3/uL (ref 0.7–4.0)
MCH: 31.4 pg (ref 26.0–34.0)
MCHC: 31.9 g/dL (ref 30.0–36.0)
MCV: 98.5 fL (ref 80.0–100.0)
Monocytes Absolute: 0.5 10*3/uL (ref 0.1–1.0)
Monocytes Relative: 9 %
NRBC: 0 % (ref 0.0–0.2)
Neutro Abs: 2.8 10*3/uL (ref 1.7–7.7)
Neutrophils Relative %: 55 %
Platelet Count: 294 10*3/uL (ref 150–400)
RBC: 3.98 MIL/uL (ref 3.87–5.11)
RDW: 13.3 % (ref 11.5–15.5)
WBC Count: 5.1 10*3/uL (ref 4.0–10.5)

## 2018-07-09 LAB — CMP (CANCER CENTER ONLY)
ALT: 15 U/L (ref 0–44)
AST: 15 U/L (ref 15–41)
Albumin: 4.4 g/dL (ref 3.5–5.0)
Alkaline Phosphatase: 74 U/L (ref 38–126)
Anion gap: 10 (ref 5–15)
BUN: 14 mg/dL (ref 8–23)
CO2: 27 mmol/L (ref 22–32)
Calcium: 10 mg/dL (ref 8.9–10.3)
Chloride: 104 mmol/L (ref 98–111)
Creatinine: 1.39 mg/dL — ABNORMAL HIGH (ref 0.44–1.00)
GFR, EST AFRICAN AMERICAN: 44 mL/min — AB (ref >60)
GFR, Estimated: 38 mL/min — ABNORMAL LOW (ref >60)
Glucose, Bld: 94 mg/dL (ref 70–99)
Potassium: 4.2 mmol/L (ref 3.5–5.1)
Sodium: 141 mmol/L (ref 135–145)
Total Bilirubin: 0.3 mg/dL (ref 0.3–1.2)
Total Protein: 7.8 g/dL (ref 6.5–8.1)
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
## 2018-07-09 NOTE — Telephone Encounter (Signed)
lmom to inform pt of 8/18 appt at 10 am per 2/18 LOS

## 2018-07-10 LAB — CANCER ANTIGEN 27.29: CA 27.29: 39.6 U/mL — ABNORMAL HIGH (ref 0.0–38.6)

## 2018-07-10 LAB — KAPPA/LAMBDA LIGHT CHAINS
Kappa free light chain: 35.3 mg/L — ABNORMAL HIGH (ref 3.3–19.4)
Kappa, lambda light chain ratio: 1.68 — ABNORMAL HIGH (ref 0.26–1.65)
Lambda free light chains: 21 mg/L (ref 5.7–26.3)

## 2018-07-10 LAB — IGG, IGA, IGM
IGA: 354 mg/dL — AB (ref 87–352)
IGG (IMMUNOGLOBIN G), SERUM: 1346 mg/dL (ref 700–1600)
IgM (Immunoglobulin M), Srm: 123 mg/dL (ref 26–217)

## 2018-07-11 LAB — MULTIPLE MYELOMA PANEL, SERUM
ALBUMIN SERPL ELPH-MCNC: 3.7 g/dL (ref 2.9–4.4)
Albumin/Glob SerPl: 1 (ref 0.7–1.7)
Alpha 1: 0.2 g/dL (ref 0.0–0.4)
Alpha2 Glob SerPl Elph-Mcnc: 1.1 g/dL — ABNORMAL HIGH (ref 0.4–1.0)
B-Globulin SerPl Elph-Mcnc: 1.3 g/dL (ref 0.7–1.3)
Gamma Glob SerPl Elph-Mcnc: 1.3 g/dL (ref 0.4–1.8)
Globulin, Total: 4 g/dL — ABNORMAL HIGH (ref 2.2–3.9)
IgA: 367 mg/dL — ABNORMAL HIGH (ref 87–352)
IgG (Immunoglobin G), Serum: 1298 mg/dL (ref 700–1600)
IgM (Immunoglobulin M), Srm: 125 mg/dL (ref 26–217)
Total Protein ELP: 7.7 g/dL (ref 6.0–8.5)

## 2018-12-02 ENCOUNTER — Other Ambulatory Visit: Payer: Self-pay | Admitting: Hematology & Oncology

## 2018-12-02 DIAGNOSIS — Z9889 Other specified postprocedural states: Secondary | ICD-10-CM

## 2019-01-01 ENCOUNTER — Ambulatory Visit
Admission: RE | Admit: 2019-01-01 | Discharge: 2019-01-01 | Disposition: A | Discharge status: Home / Self Care | Payer: Medicare Other | Source: Ambulatory Visit | Attending: Hematology & Oncology | Admitting: Hematology & Oncology

## 2019-01-01 ENCOUNTER — Other Ambulatory Visit: Payer: Self-pay

## 2019-01-01 DIAGNOSIS — Z9889 Other specified postprocedural states: Secondary | ICD-10-CM

## 2019-01-07 ENCOUNTER — Inpatient Hospital Stay: Payer: Medicare Other | Attending: Hematology & Oncology | Admitting: Hematology & Oncology

## 2019-01-07 ENCOUNTER — Other Ambulatory Visit: Payer: Self-pay

## 2019-01-07 ENCOUNTER — Inpatient Hospital Stay: Payer: Medicare Other

## 2019-01-07 VITALS — BP 174/77 | HR 77 | Temp 97.4°F | Resp 18 | Wt 187.5 lb

## 2019-01-07 DIAGNOSIS — Z923 Personal history of irradiation: Secondary | ICD-10-CM | POA: Insufficient documentation

## 2019-01-07 DIAGNOSIS — Z9221 Personal history of antineoplastic chemotherapy: Secondary | ICD-10-CM | POA: Diagnosis not present

## 2019-01-07 DIAGNOSIS — C9001 Multiple myeloma in remission: Secondary | ICD-10-CM | POA: Diagnosis present

## 2019-01-07 DIAGNOSIS — Z171 Estrogen receptor negative status [ER-]: Secondary | ICD-10-CM | POA: Diagnosis not present

## 2019-01-07 DIAGNOSIS — C50911 Malignant neoplasm of unspecified site of right female breast: Secondary | ICD-10-CM

## 2019-01-07 DIAGNOSIS — Z7984 Long term (current) use of oral hypoglycemic drugs: Secondary | ICD-10-CM | POA: Diagnosis not present

## 2019-01-07 DIAGNOSIS — Z79899 Other long term (current) drug therapy: Secondary | ICD-10-CM | POA: Insufficient documentation

## 2019-01-07 DIAGNOSIS — Z853 Personal history of malignant neoplasm of breast: Secondary | ICD-10-CM | POA: Insufficient documentation

## 2019-01-07 LAB — CBC WITH DIFFERENTIAL (CANCER CENTER ONLY)
Abs Immature Granulocytes: 0.01 10*3/uL (ref 0.00–0.07)
Basophils Absolute: 0 10*3/uL (ref 0.0–0.1)
Basophils Relative: 1 %
Eosinophils Absolute: 0.1 10*3/uL (ref 0.0–0.5)
Eosinophils Relative: 2 %
HCT: 40 % (ref 36.0–46.0)
Hemoglobin: 12.7 g/dL (ref 12.0–15.0)
Immature Granulocytes: 0 %
Lymphocytes Relative: 27 %
Lymphs Abs: 1.2 10*3/uL (ref 0.7–4.0)
MCH: 31.1 pg (ref 26.0–34.0)
MCHC: 31.8 g/dL (ref 30.0–36.0)
MCV: 98 fL (ref 80.0–100.0)
Monocytes Absolute: 0.4 10*3/uL (ref 0.1–1.0)
Monocytes Relative: 9 %
Neutro Abs: 2.7 10*3/uL (ref 1.7–7.7)
Neutrophils Relative %: 61 %
Platelet Count: 320 10*3/uL (ref 150–400)
RBC: 4.08 MIL/uL (ref 3.87–5.11)
RDW: 13.4 % (ref 11.5–15.5)
WBC Count: 4.5 10*3/uL (ref 4.0–10.5)
nRBC: 0 % (ref 0.0–0.2)

## 2019-01-07 LAB — CMP (CANCER CENTER ONLY)
ALT: 15 U/L (ref 0–44)
AST: 14 U/L — ABNORMAL LOW (ref 15–41)
Albumin: 4.2 g/dL (ref 3.5–5.0)
Alkaline Phosphatase: 80 U/L (ref 38–126)
Anion gap: 9 (ref 5–15)
BUN: 16 mg/dL (ref 8–23)
CO2: 26 mmol/L (ref 22–32)
Calcium: 9.3 mg/dL (ref 8.9–10.3)
Chloride: 104 mmol/L (ref 98–111)
Creatinine: 1.57 mg/dL — ABNORMAL HIGH (ref 0.44–1.00)
GFR, Est AFR Am: 38 mL/min — ABNORMAL LOW (ref >60)
GFR, Estimated: 33 mL/min — ABNORMAL LOW (ref >60)
Glucose, Bld: 134 mg/dL — ABNORMAL HIGH (ref 70–99)
Potassium: 4.4 mmol/L (ref 3.5–5.1)
Sodium: 139 mmol/L (ref 135–145)
Total Bilirubin: 0.3 mg/dL (ref 0.3–1.2)
Total Protein: 7.5 g/dL (ref 6.5–8.1)

## 2019-01-07 LAB — LACTATE DEHYDROGENASE: LDH: 148 U/L (ref 98–192)

## 2019-01-07 NOTE — Progress Notes (Signed)
Hematology and Oncology Follow Up Visit  [place] [id number] 12/23/1945 73 y.o. 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 Radiation therapy to the right breast - completed in December 2017 Status post autologous stem cell transplant for myeloma at [facility]         in October 2006.    Interim History:  Ms. Heffernan is here today for follow-up.  So far, she is doing a good job trying to avoid the coronavirus.  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  This is providing her some relief.  She was having some issues back in the wintertime.  Her family is been safe.  Her myeloma has not come back.  Her last M spike back in February was not observed.  Her IgG level was 1300 mg/dL.  She is had no problems with her breast cancer.  She has had no swelling.  There is been no pain.  She is had no fatigue or weakness.    Overall, her performance status is ECOG 0  Medications:  Allergies as of 01/07/2019      Reactions   Lisinopril Other (See Comments), Cough   Throat itching      Medication List       Accurate as of January 07, 2019 10:55 AM. If you have any questions, ask your nurse or doctor.        acetaminophen 325 MG tablet Commonly known as: TYLENOL Take 650 mg by mouth every 6 (six) hours as needed.   amLODipine 5 MG tablet Commonly known as: Norvasc Take 1 tablet (5 mg total) by mouth daily.   dexlansoprazole 60 MG capsule Commonly known as: DEXILANT Take 1 capsule (60 mg total) by mouth daily.   metFORMIN 500 MG tablet Commonly known as: GLUCOPHAGE Take 500 mg by mouth daily.   Vitamin D3 125 MCG (5000 UT) Caps Take 5,000 Units by mouth daily.       Allergies:  Allergies  Allergen Reactions  .  Lisinopril Other (See Comments) and Cough    Throat itching    Past Medical History, Surgical history, Social history, and Family History were reviewed and updated.  Review of Systems: Review of Systems  Constitutional: Negative.   HENT: Negative.   Eyes: Negative.   Respiratory: Negative.   Cardiovascular: Negative.   Gastrointestinal: Negative.   Genitourinary: Negative.   Musculoskeletal: Negative.   Skin: Negative.   Neurological: Negative.   Endo/Heme/Allergies: Negative.   Psychiatric/Behavioral: Negative.       Physical Exam:  weight is 187 lb 8 oz (85 kg). Her temperature is 97.4 F (36.3 C) (abnormal). Her blood pressure is 174/77 (abnormal) and her pulse is 77. Her respiration is 18 and oxygen saturation is 99%.   Wt Readings from Last 3 Encounters:  01/07/19 187 lb 8 oz (85 kg)  05/23/18 175 lb (79.4 kg)  03/05/18 194 lb 8 oz (88.2 kg)    Physical Exam Vitals signs reviewed.  HENT:     Head: Normocephalic and atraumatic.  Eyes:     Pupils: Pupils are equal, round, and reactive to light.  Neck:     Musculoskeletal: Normal range of motion.  Cardiovascular:     Rate and Rhythm: Normal rate and regular rhythm.     Heart sounds: Normal heart sounds.  Pulmonary:     Effort: Pulmonary effort is normal.     Breath sounds: Normal breath sounds.  Abdominal:     General: Bowel sounds are normal.     Palpations: Abdomen is soft.  Musculoskeletal: Normal range of motion.        General: No tenderness or deformity.  Lymphadenopathy:     Cervical: No cervical adenopathy.  Skin:    General: Skin is warm and dry.     Findings: No erythema or rash.  Neurological:     Mental Status: She is alert and oriented to person, place, and time.  Psychiatric:        Behavior: Behavior normal.        Thought Content: Thought content normal.        Judgment: Judgment normal.    Lab Results  Component Value Date   WBC 4.5 01/07/2019   HGB 12.7 01/07/2019   HCT 40.0  01/07/2019   MCV 98.0 01/07/2019   PLT 320 01/07/2019   Lab Results  Component Value Date   FERRITIN 2,980 (H) 12/01/2015   IRON 26 (L) 12/01/2015   TIBC 143 (L) 12/01/2015   UIBC 117 12/01/2015   IRONPCTSAT 18 12/01/2015   Lab Results  Component Value Date   RETICCTPCT <0.4 (L) 12/01/2015   RBC 4.08 01/07/2019   Lab Results  Component Value Date   KPAFRELGTCHN 35.3 (H) 07/09/2018   LAMBDASER 21.0 07/09/2018   KAPLAMBRATIO 1.68 (H) 07/09/2018   Lab Results  Component Value Date   IGGSERUM 1,298 07/09/2018   IGGSERUM 1,346 07/09/2018   IGA 367 (H) 07/09/2018   IGA 354 (H) 07/09/2018   IGMSERUM 125 07/09/2018   IGMSERUM 123 07/09/2018   Lab Results  Component Value Date   TOTALPROTELP 7.7 07/09/2018   ALBUMINELP 3.7 12/25/2017   A1GS 0.2 12/25/2017   A2GS 1.0 12/25/2017   BETS 1.3 12/25/2017   BETA2SER 0.5 03/08/2015   GAMS 1.2 12/25/2017   MSPIKE Not Observed 12/25/2017   SPEI * 03/08/2015     Chemistry      Component Value Date/Time   NA 139 01/07/2019 0956   NA 141 11/28/2016 0908   NA 142 10/22/2015 0950   K 4.4 01/07/2019 0956   K 4.0 11/28/2016 0908   K 4.2 10/22/2015 0950   CL 104 01/07/2019 0956   CL 106 11/28/2016 0908   CO2 26 01/07/2019 0956   CO2 26 11/28/2016 0908   CO2 24 10/22/2015 0950   BUN 16 01/07/2019 0956   BUN 12 11/28/2016 0908   BUN 14.0 10/22/2015 0950   CREATININE 1.57 (H) 01/07/2019 0956   CREATININE 1.6 (H) 11/28/2016 0908   CREATININE 1.6 (H) 10/22/2015 0950      Component Value Date/Time   CALCIUM 9.3 01/07/2019 0956   CALCIUM 9.6 11/28/2016 0908   CALCIUM 9.4 10/22/2015 0950   ALKPHOS 80 01/07/2019 0956   ALKPHOS 89 (H) 11/28/2016 0908   ALKPHOS 79 10/22/2015 0950   AST 14 (L) 01/07/2019 0956   AST 35 (H) 10/22/2015 0950   ALT 15 01/07/2019 0956   ALT 40 11/28/2016 0908   ALT 41 10/22/2015 0950   BILITOT 0.3 01/07/2019 0956   BILITOT 0.30 10/22/2015 0950     Impression and Plan: Ms. Charnley is a 73 yo  African American female with IgG Kappa myeloma - remission  s/p ASCT in May 2006.  She also stage Ia triple negative breast cancer of the right breast. She has completed chemo and radiation.  She completed over treatment back in December 2017.   She continues to do quite well.   Again, with respect to her breast cancer, everything is still doing quite well.  I do not see any evidence of recurrent disease to date.  Is now is been a little over 2 years that she completed chemotherapy.  We will plan to get her back to see [place] in another 6 months.   [name], MD 8/18/202010:55 AM

## 2019-01-08 LAB — PROTEIN ELECTROPHORESIS, SERUM, WITH REFLEX
A/G Ratio: 0.9 (ref 0.7–1.7)
Albumin ELP: 3.5 g/dL (ref 2.9–4.4)
Alpha-1-Globulin: 0.2 g/dL (ref 0.0–0.4)
Alpha-2-Globulin: 1 g/dL (ref 0.4–1.0)
Beta Globulin: 1.4 g/dL — ABNORMAL HIGH (ref 0.7–1.3)
Gamma Globulin: 1.4 g/dL (ref 0.4–1.8)
Globulin, Total: 4.1 g/dL — ABNORMAL HIGH (ref 2.2–3.9)
Total Protein ELP: 7.6 g/dL (ref 6.0–8.5)

## 2019-01-08 LAB — IGG, IGA, IGM
IgA: 374 mg/dL (ref 64–422)
IgG (Immunoglobin G), Serum: 1394 mg/dL (ref 586–1602)
IgM (Immunoglobulin M), Srm: 123 mg/dL (ref 26–217)

## 2019-01-08 LAB — KAPPA/LAMBDA LIGHT CHAINS
Kappa free light chain: 41.8 mg/L — ABNORMAL HIGH (ref 3.3–19.4)
Kappa, lambda light chain ratio: 1.79 — ABNORMAL HIGH (ref 0.26–1.65)
Lambda free light chains: 23.3 mg/L (ref 5.7–26.3)

## 2019-06-05 ENCOUNTER — Encounter (HOSPITAL_COMMUNITY): Payer: Self-pay

## 2019-06-05 ENCOUNTER — Observation Stay (HOSPITAL_COMMUNITY)
Admission: EM | Admit: 2019-06-05 | Discharge: 2019-06-06 | Disposition: A | Discharge status: Home / Self Care | Payer: Medicare Other | Attending: General Surgery | Admitting: General Surgery

## 2019-06-05 ENCOUNTER — Observation Stay (HOSPITAL_COMMUNITY): Payer: Medicare Other

## 2019-06-05 ENCOUNTER — Other Ambulatory Visit: Payer: Self-pay

## 2019-06-05 DIAGNOSIS — E1122 Type 2 diabetes mellitus with diabetic chronic kidney disease: Secondary | ICD-10-CM | POA: Insufficient documentation

## 2019-06-05 DIAGNOSIS — F329 Major depressive disorder, single episode, unspecified: Secondary | ICD-10-CM | POA: Insufficient documentation

## 2019-06-05 DIAGNOSIS — D62 Acute posthemorrhagic anemia: Secondary | ICD-10-CM | POA: Insufficient documentation

## 2019-06-05 DIAGNOSIS — Z853 Personal history of malignant neoplasm of breast: Secondary | ICD-10-CM | POA: Insufficient documentation

## 2019-06-05 DIAGNOSIS — Z20822 Contact with and (suspected) exposure to covid-19: Secondary | ICD-10-CM | POA: Insufficient documentation

## 2019-06-05 DIAGNOSIS — Z888 Allergy status to other drugs, medicaments and biological substances status: Secondary | ICD-10-CM | POA: Insufficient documentation

## 2019-06-05 DIAGNOSIS — N183 Chronic kidney disease, stage 3 unspecified: Secondary | ICD-10-CM | POA: Diagnosis not present

## 2019-06-05 DIAGNOSIS — Z8249 Family history of ischemic heart disease and other diseases of the circulatory system: Secondary | ICD-10-CM | POA: Insufficient documentation

## 2019-06-05 DIAGNOSIS — Z833 Family history of diabetes mellitus: Secondary | ICD-10-CM | POA: Diagnosis not present

## 2019-06-05 DIAGNOSIS — I129 Hypertensive chronic kidney disease with stage 1 through stage 4 chronic kidney disease, or unspecified chronic kidney disease: Secondary | ICD-10-CM | POA: Insufficient documentation

## 2019-06-05 DIAGNOSIS — Z23 Encounter for immunization: Secondary | ICD-10-CM | POA: Insufficient documentation

## 2019-06-05 DIAGNOSIS — Z96652 Presence of left artificial knee joint: Secondary | ICD-10-CM | POA: Insufficient documentation

## 2019-06-05 DIAGNOSIS — C9001 Multiple myeloma in remission: Secondary | ICD-10-CM | POA: Insufficient documentation

## 2019-06-05 DIAGNOSIS — S0101XA Laceration without foreign body of scalp, initial encounter: Principal | ICD-10-CM | POA: Insufficient documentation

## 2019-06-05 DIAGNOSIS — M1990 Unspecified osteoarthritis, unspecified site: Secondary | ICD-10-CM | POA: Insufficient documentation

## 2019-06-05 DIAGNOSIS — Z7984 Long term (current) use of oral hypoglycemic drugs: Secondary | ICD-10-CM | POA: Insufficient documentation

## 2019-06-05 DIAGNOSIS — Z79899 Other long term (current) drug therapy: Secondary | ICD-10-CM | POA: Insufficient documentation

## 2019-06-05 DIAGNOSIS — W208XXA Other cause of strike by thrown, projected or falling object, initial encounter: Secondary | ICD-10-CM | POA: Insufficient documentation

## 2019-06-05 DIAGNOSIS — S0990XA Unspecified injury of head, initial encounter: Secondary | ICD-10-CM

## 2019-06-05 DIAGNOSIS — R2681 Unsteadiness on feet: Secondary | ICD-10-CM | POA: Insufficient documentation

## 2019-06-05 HISTORY — DX: Laceration without foreign body of scalp, initial encounter: S01.01XA

## 2019-06-05 LAB — RESPIRATORY PANEL BY RT PCR (FLU A&B, COVID)
Influenza A by PCR: NEGATIVE
Influenza B by PCR: NEGATIVE
SARS Coronavirus 2 by RT PCR: NEGATIVE

## 2019-06-05 LAB — CBC WITH DIFFERENTIAL/PLATELET
Abs Immature Granulocytes: 0.02 10*3/uL (ref 0.00–0.07)
Basophils Absolute: 0 10*3/uL (ref 0.0–0.1)
Basophils Relative: 1 %
Eosinophils Absolute: 0.1 10*3/uL (ref 0.0–0.5)
Eosinophils Relative: 2 %
HCT: 37 % (ref 36.0–46.0)
Hemoglobin: 11.5 g/dL — ABNORMAL LOW (ref 12.0–15.0)
Immature Granulocytes: 0 %
Lymphocytes Relative: 33 %
Lymphs Abs: 1.6 10*3/uL (ref 0.7–4.0)
MCH: 31.2 pg (ref 26.0–34.0)
MCHC: 31.1 g/dL (ref 30.0–36.0)
MCV: 100.3 fL — ABNORMAL HIGH (ref 80.0–100.0)
Monocytes Absolute: 0.4 10*3/uL (ref 0.1–1.0)
Monocytes Relative: 9 %
Neutro Abs: 2.7 10*3/uL (ref 1.7–7.7)
Neutrophils Relative %: 55 %
Platelets: 285 10*3/uL (ref 150–400)
RBC: 3.69 MIL/uL — ABNORMAL LOW (ref 3.87–5.11)
RDW: 13 % (ref 11.5–15.5)
WBC: 4.9 10*3/uL (ref 4.0–10.5)
nRBC: 0 % (ref 0.0–0.2)

## 2019-06-05 LAB — CBC
HCT: 29.4 % — ABNORMAL LOW (ref 36.0–46.0)
Hemoglobin: 9.5 g/dL — ABNORMAL LOW (ref 12.0–15.0)
MCH: 31.5 pg (ref 26.0–34.0)
MCHC: 32.3 g/dL (ref 30.0–36.0)
MCV: 97.4 fL (ref 80.0–100.0)
Platelets: 263 10*3/uL (ref 150–400)
RBC: 3.02 MIL/uL — ABNORMAL LOW (ref 3.87–5.11)
RDW: 13 % (ref 11.5–15.5)
WBC: 10.5 10*3/uL (ref 4.0–10.5)
nRBC: 0 % (ref 0.0–0.2)

## 2019-06-05 LAB — BASIC METABOLIC PANEL
Anion gap: 12 (ref 5–15)
BUN: 16 mg/dL (ref 8–23)
CO2: 20 mmol/L — ABNORMAL LOW (ref 22–32)
Calcium: 9.3 mg/dL (ref 8.9–10.3)
Chloride: 109 mmol/L (ref 98–111)
Creatinine, Ser: 1.54 mg/dL — ABNORMAL HIGH (ref 0.44–1.00)
GFR calc Af Amer: 39 mL/min — ABNORMAL LOW (ref >60)
GFR calc non Af Amer: 34 mL/min — ABNORMAL LOW (ref >60)
Glucose, Bld: 136 mg/dL — ABNORMAL HIGH (ref 70–99)
Potassium: 4 mmol/L (ref 3.5–5.1)
Sodium: 141 mmol/L (ref 135–145)

## 2019-06-05 LAB — PROTIME-INR
INR: 1.1 (ref 0.8–1.2)
Prothrombin Time: 13.7 seconds (ref 11.4–15.2)

## 2019-06-05 LAB — TYPE AND SCREEN
ABO/RH(D): AB NEG
Antibody Screen: NEGATIVE

## 2019-06-05 LAB — GLUCOSE, CAPILLARY
Glucose-Capillary: 163 mg/dL — ABNORMAL HIGH (ref 70–99)
Glucose-Capillary: 119 mg/dL — ABNORMAL HIGH (ref 70–99)

## 2019-06-05 MED ORDER — METOPROLOL TARTRATE 5 MG/5ML IV SOLN: 5.0000 mg | Freq: Four times a day (QID) | INTRAVENOUS | Status: DC | PRN

## 2019-06-05 MED ORDER — IBUPROFEN 600 MG PO TABS: 600.0000 mg | ORAL_TABLET | Freq: Four times a day (QID) | ORAL | Status: DC | PRN

## 2019-06-05 MED ORDER — MORPHINE SULFATE (PF) 2 MG/ML IV SOLN: 1.0000 mg | INTRAVENOUS | Status: DC | PRN

## 2019-06-05 MED ORDER — SODIUM CHLORIDE 0.9% FLUSH
3.0000 mL | Freq: Two times a day (BID) | INTRAVENOUS | Status: DC
  Administered 2019-06-05 – 2019-06-06 (×3): 3 mL via INTRAVENOUS

## 2019-06-05 MED ORDER — ONDANSETRON 4 MG PO TBDP: 4.0000 mg | ORAL_TABLET | Freq: Four times a day (QID) | ORAL | Status: DC | PRN

## 2019-06-05 MED ORDER — LIDOCAINE HCL (PF) 1 % IJ SOLN
5.0000 mL | Freq: Once | INTRAMUSCULAR | Status: DC
  Filled 2019-06-05: qty 30

## 2019-06-05 MED ORDER — TETANUS-DIPHTH-ACELL PERTUSSIS 5-2.5-18.5 LF-MCG/0.5 IM SUSP
0.5000 mL | Freq: Once | INTRAMUSCULAR | Status: AC
  Administered 2019-06-05: 11:00:00 0.5 mL via INTRAMUSCULAR
  Filled 2019-06-05: qty 0.5

## 2019-06-05 MED ORDER — SODIUM CHLORIDE 0.9% FLUSH: 3.0000 mL | INTRAVENOUS | Status: DC | PRN

## 2019-06-05 MED ORDER — TRAMADOL HCL 50 MG PO TABS
50.0000 mg | ORAL_TABLET | Freq: Four times a day (QID) | ORAL | Status: DC | PRN
  Administered 2019-06-06: 09:00:00 100 mg via ORAL
  Filled 2019-06-05: qty 2

## 2019-06-05 MED ORDER — PANTOPRAZOLE SODIUM 40 MG IV SOLR: 40.0000 mg | Freq: Every day | INTRAVENOUS | Status: DC

## 2019-06-05 MED ORDER — VITAMIN D3 25 MCG (1000 UNIT) PO TABS
5000.0000 [IU] | ORAL_TABLET | Freq: Every day | ORAL | Status: DC
  Administered 2019-06-06: 09:00:00 5000 [IU] via ORAL
  Filled 2019-06-05 (×2): qty 5

## 2019-06-05 MED ORDER — FENTANYL CITRATE (PF) 100 MCG/2ML IJ SOLN
50.0000 ug | Freq: Once | INTRAMUSCULAR | Status: AC
  Administered 2019-06-05: 50 ug via INTRAVENOUS

## 2019-06-05 MED ORDER — LIDOCAINE HCL (PF) 1 % IJ SOLN
30.0000 mL | Freq: Once | INTRAMUSCULAR | Status: AC
  Administered 2019-06-05: 10:00:00 5 mL via SUBCUTANEOUS

## 2019-06-05 MED ORDER — CEFAZOLIN SODIUM-DEXTROSE 2-4 GM/100ML-% IV SOLN
2.0000 g | Freq: Once | INTRAVENOUS | Status: AC
  Administered 2019-06-05: 10:00:00 2 g via INTRAVENOUS
  Filled 2019-06-05: qty 100

## 2019-06-05 MED ORDER — PANTOPRAZOLE SODIUM 40 MG PO TBEC
40.0000 mg | DELAYED_RELEASE_TABLET | Freq: Every day | ORAL | Status: DC
  Administered 2019-06-05: 13:00:00 40 mg via ORAL
  Filled 2019-06-05: qty 1

## 2019-06-05 MED ORDER — BACITRACIN ZINC 500 UNIT/GM EX OINT
TOPICAL_OINTMENT | Freq: Two times a day (BID) | CUTANEOUS | Status: DC
  Filled 2019-06-05: qty 0.9
  Filled 2019-06-05: qty 28.35

## 2019-06-05 MED ORDER — ACETAMINOPHEN 325 MG PO TABS
650.0000 mg | ORAL_TABLET | Freq: Four times a day (QID) | ORAL | Status: DC | PRN
  Administered 2019-06-05: 650 mg via ORAL
  Filled 2019-06-05: qty 2

## 2019-06-05 MED ORDER — LIDOCAINE HCL (PF) 1 % IJ SOLN
INTRAMUSCULAR | Status: AC
  Filled 2019-06-05: qty 30

## 2019-06-05 MED ORDER — METFORMIN HCL 500 MG PO TABS
500.0000 mg | ORAL_TABLET | Freq: Every day | ORAL | Status: DC
  Administered 2019-06-05: 17:00:00 500 mg via ORAL
  Filled 2019-06-05: qty 1

## 2019-06-05 MED ORDER — FENTANYL CITRATE (PF) 100 MCG/2ML IJ SOLN
INTRAMUSCULAR | Status: AC
  Filled 2019-06-05: qty 2

## 2019-06-05 MED ORDER — SODIUM CHLORIDE 0.9 % IV SOLN: 250.0000 mL | INTRAVENOUS | Status: DC | PRN

## 2019-06-05 MED ORDER — ONDANSETRON HCL 4 MG/2ML IJ SOLN: 4.0000 mg | Freq: Four times a day (QID) | INTRAMUSCULAR | Status: DC | PRN

## 2019-06-05 NOTE — Progress Notes (Signed)
Patient arrived to [id number] via stretcher, AOX4, ambulatory with assistance. Dressing around the head in place, assessed clean dry and intact. VS as follows BP 140/78, PR 118, Temp 97.6, RR 19. No complaints of severe pain. Oriented to room, use of call bell, bed controls, informed about visitation policy. Will continue to monitor patient.
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
## 2019-06-05 NOTE — ED Notes (Signed)
Trauma PA at bedside. 

## 2019-06-05 NOTE — H&P (Signed)
[name] Trauma Admission Note  [place] 11-02-1945  [id number].    Requesting MD: Kathrynn Humble Chief Complaint/Reason for Consult: scalp laceration  HPI:  Patient is a 74 year old female who was brought to the ED with scalp laceration. 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 EMS struggled to control the bleeding and brought her to the ED. She denied significant pain initially but had some upon my examination. She complained of headache and some lightheadedness. She is alert and oriented and reports she did not sustain any other injuries. Denies pain elsewhere. She reported no other health conditions to me but chart lists Hx of HTN, T2DM, breast cancer and MM. She is currently in remission. She denies any daily medications. She reported NKDA, lisinopril listed as an allergy in chart. Patient reports she lives alone but has children. I spoke with her oldest daughter on the phone.  Reported tetanus was not up to date, tdap given in ED.   ROS: Review of Systems  Constitutional: Negative for chills and fever.  Eyes: Negative for blurred vision and double vision.  Respiratory: Negative for shortness of breath and wheezing.   Cardiovascular: Negative for chest pain and palpitations.  Gastrointestinal: Negative for abdominal pain, nausea and vomiting.  Musculoskeletal: Negative for back pain, joint pain and neck pain.  Neurological: Positive for dizziness and headaches. Negative for sensory change, speech change and loss of consciousness.    Family History  Problem Relation Age of Onset  . Lung cancer Father        smoker  . Liver cancer Father   . Throat cancer Brother   . Esophageal cancer Brother   . Heart attack Mother   . Diabetes Mother   . Diabetes Sister   . Liver cancer Brother   . Pancreatic cancer Sister   . Rheum arthritis Sister   . Colon polyps Daughter   . Colon cancer Neg  Hx   . Rectal cancer Neg Hx   . Stomach cancer Neg Hx   . Breast cancer Neg Hx     Past Medical History:  Diagnosis Date  . Anemia   . Arthralgia    chronic, 2nd degree to stem cell transplantation  . Breast cancer ([place])   . Breast cancer, stage 1 ([place]) 09/09/2015  . Chronic pain of left knee   . Degenerative joint disease involving multiple joints 03/29/2011  . Diabetes mellitus without complication [facility])    Patient reports she hasnt had Diabetes in years  . DJD (degenerative joint disease)   . E. coli gastroenteritis   . GERD (gastroesophageal reflux disease)   . Herpes simplex esophagitis 2006  . History of blood transfusion    with first pregnancy  . History of multiple myeloma    chemo XRT, Dr. Marin Olp  . History of radiation therapy 03/16/16-05/02/16   right breast 50.4 Gy in 28 fractions, boost to 60.4 Gy in 5 fractions  . 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 HTN (hypertension) 2005  . Hyperlipidemia   . Myeloma ([place]) 03/28/2011  . OA (osteoarthritis)   . Paget's bone disease   . Personal history of adenomatous colonic polyps 11/02/2011   2 diminutive serrated adenomas 10/2011 Repeat colonoscopy 2018 (change from original recall based upon current guidelines 12/07/2014)    . PONV (postoperative nausea and vomiting)   .  Radiation 09/28/2006 - 10/11/2006   skull treated to 1980 cGy by Dr. Elba Barman  . Renal insufficiency    H/O resolved  . Vitamin D deficiency     Past Surgical History:  Procedure Laterality Date  . 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 BREAST LUMPECTOMY Right 11/2015   radiation and chemo  . BREAST LUMPECTOMY WITH RADIOACTIVE SEED AND SENTINEL LYMPH NODE BIOPSY Right 09/22/2015   Procedure: BREAST LUMPECTOMY WITH RADIOACTIVE SEED AND SENTINEL LYMPH NODE BIOPSY;  Surgeon: Excell Seltzer, MD;  Location: [facility];  Service:  General;  Laterality: Right;  . COLONOSCOPY    . IR REMOVAL TUN ACCESS W/ PORT W/O FL MOD SED  05/23/2018  . JOINT REPLACEMENT Left   . KNEE ARTHROSCOPY  05/2008   left  . TOTAL KNEE ARTHROPLASTY  10/11/2010   left    Social History:  reports that she has never smoked. She has never used smokeless tobacco. She reports that she does not drink alcohol or use drugs.  Allergies:  Allergies  Allergen Reactions  . Lisinopril Other (See Comments) and Cough    Throat itching    (Not in a hospital admission)   Blood pressure 137/78, pulse (!) 109, temperature 97.6 F (36.4 C), temperature source Oral, resp. rate (!) 21, height '5\' 2"'  (1.575 m), weight 81.6 kg, last menstrual period 05/22/2000, SpO2 97 %. Physical Exam: Physical Exam Constitutional:      General: She is not in acute distress.    Appearance: She is well-developed and overweight.  HENT:     Head: Normocephalic. Laceration present. No raccoon eyes or Battle's sign.      Comments: 16 cm curved laceration across forehead with active bleeding and large hematoma    Right Ear: External ear normal.     Left Ear: External ear normal.     Nose: Nose normal.     Mouth/Throat:     Lips: Pink.     Mouth: Mucous membranes are moist.  Eyes:     General: Lids are normal. No scleral icterus.    Extraocular Movements: Extraocular movements intact.     Conjunctiva/sclera: Conjunctivae normal.     Pupils: Pupils are equal, round, and reactive to light.  Cardiovascular:     Rate and Rhythm: Regular rhythm. Tachycardia present.     Pulses:          Radial pulses are 2+ on the right side and 2+ on the left side.       Dorsalis pedis pulses are 2+ on the right side and 2+ on the left side.     Heart sounds: Normal heart sounds.  Pulmonary:     Effort: Pulmonary effort is normal.     Breath sounds: Normal breath sounds.  Abdominal:     General: Bowel sounds are normal. There is no distension.     Palpations: Abdomen is soft. There is  no hepatomegaly or splenomegaly.     Tenderness: There is no abdominal tenderness.  Musculoskeletal:     Cervical back: Normal range of motion and neck supple. No spinous process tenderness or muscular tenderness.     Right lower leg: No edema.     Left lower leg: No edema.     Comments: ROM grossly intact in all 4 extremities, no obvious deformities all 4 extremities   Skin:    General: Skin is warm and dry.     Findings:  No rash.  Neurological:     Mental Status: She is alert and oriented to person, place, and time.     GCS: GCS eye subscore is 4. GCS verbal subscore is 5. GCS motor subscore is 6.  Psychiatric:        Attention and Perception: Attention normal.        Mood and Affect: Mood normal.        Speech: Speech normal.        Behavior: Behavior normal. Behavior is cooperative.     Results for orders placed or performed during the hospital encounter of 06/05/19 (from the past 48 hour(s))  CBC with Differential     Status: Abnormal   Collection Time: 06/05/19  9:30 AM  Result Value Ref Range   WBC 4.9 4.0 - 10.5 K/uL   RBC 3.69 (L) 3.87 - 5.11 MIL/uL   Hemoglobin 11.5 (L) 12.0 - 15.0 g/dL   HCT 37.0 36.0 - 46.0 %   MCV 100.3 (H) 80.0 - 100.0 fL   MCH 31.2 26.0 - 34.0 pg   MCHC 31.1 30.0 - 36.0 g/dL   RDW 13.0 11.5 - 15.5 %   Platelets 285 150 - 400 K/uL   nRBC 0.0 0.0 - 0.2 %   Neutrophils Relative % 55 %   Neutro Abs 2.7 1.7 - 7.7 K/uL   Lymphocytes Relative 33 %   Lymphs Abs 1.6 0.7 - 4.0 K/uL   Monocytes Relative 9 %   Monocytes Absolute 0.4 0.1 - 1.0 K/uL   Eosinophils Relative 2 %   Eosinophils Absolute 0.1 0.0 - 0.5 K/uL   Basophils Relative 1 %   Basophils Absolute 0.0 0.0 - 0.1 K/uL   Immature Granulocytes 0 %   Abs Immature Granulocytes 0.02 0.00 - 0.07 K/uL    Comment: Performed at [facility], [address], [place], [place] [address]  Type and screen [place]     Status: None   Collection Time: 06/05/19  9:30 AM  Result  Value Ref Range   ABO/RH(D) AB NEG    Antibody Screen NEG    Sample Expiration      06/08/2019,2359 Performed at [facility], [place] [address] [place], [place] [address]   Basic metabolic panel     Status: Abnormal   Collection Time: 06/05/19  9:30 AM  Result Value Ref Range   Sodium 141 135 - 145 mmol/L   Potassium 4.0 3.5 - 5.1 mmol/L   Chloride 109 98 - 111 mmol/L   CO2 20 (L) 22 - 32 mmol/L   Glucose, Bld 136 (H) 70 - 99 mg/dL   BUN 16 8 - 23 mg/dL   Creatinine, Ser 1.54 (H) 0.44 - 1.00 mg/dL   Calcium 9.3 8.9 - 10.3 mg/dL   GFR calc non Af Amer 34 (L) >60 mL/min   GFR calc Af Amer 39 (L) >60 mL/min   Anion gap 12 5 - 15    Comment: Performed at [facility], [place] [address], [place], [place] [address]   No results found.    Assessment/Plan Struck in head with wheelchair - CT head ordered, patient is neurologically appropriate at this time.  Scalp laceration - closed in the emergency room, hgb 11.5 on arrival, continue to monitor vitals and cbc  Patient reported no PMH and no daily medications. Noted chart lists hx of HTN, T2DM, Hx of breast cancer in remission, hx of MM in remission.  Admit for observation. Trend hgb and vitals. Will need suture removal in 7-10 days. Will follow up on CT and medication reconciliation.    Brigid Re, [facility] Surgery 06/05/2019, 10:47 AM Please see Amion for pager number during day hours 7:00am-4:30pm

## 2019-06-05 NOTE — ED Provider Notes (Signed)
[place] EMERGENCY DEPARTMENT Provider Note   CSN: [id number] Arrival date & time: 06/05/19  9622     History Chief Complaint  Patient presents with  . head injury/lac    [name] is a 74 y.o. female with history of HTN, DM, breast cancer and MM currently in remission who presents with a head injury and scalp laceration. The patient is a bus driver for special needs patients and she pulled the wheelchair lift down and it fell on to the top of her head. She had immediate onset of bleeding and EMS was called. EMS had a difficult time controlling the bleeding. She has been NPO since 8PM last night. She denies any significant pain. Tetanus is not UTD.   HPI     Past Medical History:  Diagnosis Date  . Anemia   . Arthralgia    chronic, 2nd degree to stem cell transplantation  . Breast cancer ([place])   . Breast cancer, stage 1 ([place]) 09/09/2015  . Chronic pain of left knee   . Degenerative joint disease involving multiple joints 03/29/2011  . Diabetes mellitus without complication [facility])    Patient reports she hasnt had Diabetes in years  . DJD (degenerative joint disease)   . E. coli gastroenteritis   . GERD (gastroesophageal reflux disease)   . Herpes simplex esophagitis 2006  . History of blood transfusion    with first pregnancy  . History of multiple myeloma    chemo XRT, Dr. Marin Olp  . History of radiation therapy 03/16/16-05/02/16   right breast 50.4 Gy in 28 fractions, boost to 60.4 Gy in 5 fractions  . 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 HTN (hypertension) 2005  . Hyperlipidemia   . Myeloma ([place]) 03/28/2011  . OA (osteoarthritis)   . Paget's bone disease   . Personal history of adenomatous colonic polyps 11/02/2011   2 diminutive serrated adenomas 10/2011 Repeat colonoscopy 2018 (change from original recall based upon current guidelines 12/07/2014)    . PONV (postoperative  nausea and vomiting)   . Radiation 09/28/2006 - 10/11/2006   skull treated to 1980 cGy by Dr. Elba Barman  . Renal insufficiency    H/O resolved  . Vitamin D deficiency     Patient Active Problem List   Diagnosis Date Noted  . Lytic bone lesions on xray 03/14/2016  . Anemia of chronic disease 12/14/2015  . Chronic kidney disease, stage 3 12/01/2015  . Anorexia 12/01/2015  . Cough 12/01/2015  . Breast cancer, stage 1 ([place]) 09/09/2015  . 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 Depression 04/11/2012  . Personal history of adenomatous colonic polyps 11/02/2011  . Degenerative joint disease involving multiple joints 03/29/2011  . Myeloma ([place]) 03/28/2011    Past Surgical History:  Procedure Laterality Date  . 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 BREAST LUMPECTOMY Right 11/2015   radiation and chemo  . BREAST LUMPECTOMY WITH RADIOACTIVE SEED AND SENTINEL LYMPH NODE BIOPSY Right 09/22/2015   Procedure: BREAST LUMPECTOMY WITH RADIOACTIVE SEED AND SENTINEL LYMPH NODE BIOPSY;  Surgeon: Excell Seltzer, MD;  Location: [facility];  Service: General;  Laterality: Right;  . COLONOSCOPY    . IR REMOVAL TUN ACCESS W/ PORT W/O FL MOD SED  05/23/2018  . JOINT REPLACEMENT Left   . KNEE ARTHROSCOPY  05/2008   left  .  TOTAL KNEE ARTHROPLASTY  10/11/2010   left     OB History    Gravida  3   Para  3   Term      Preterm      AB      Living  3     SAB      TAB      Ectopic      Multiple      Live Births              Family History  Problem Relation Age of Onset  . Lung cancer Father        smoker  . Liver cancer Father   . Throat cancer Brother   . Esophageal cancer Brother   . Heart attack Mother   . Diabetes Mother   . Diabetes Sister   . Liver cancer Brother   . Pancreatic cancer Sister   . Rheum arthritis Sister   . Colon polyps Daughter   . Colon cancer Neg Hx   . Rectal  cancer Neg Hx   . Stomach cancer Neg Hx   . Breast cancer Neg Hx     Social History   Tobacco Use  . Smoking status: Never Smoker  . Smokeless tobacco: Never Used  . Tobacco comment: never used tobacco  Substance Use Topics  . Alcohol use: No    Alcohol/week: 0.0 standard drinks  . Drug use: No    Home Medications Prior to Admission medications   Medication Sig Start Date End Date Taking? Authorizing Provider  acetaminophen (TYLENOL) 325 MG tablet Take 650 mg by mouth every 6 (six) hours as needed.    [provider]  amLODipine (NORVASC) 5 MG tablet Take 1 tablet (5 mg total) by mouth daily. 11/28/16   [name], MD  Cholecalciferol (VITAMIN D3) 5000 units CAPS Take 5,000 Units by mouth daily.    [provider]  dexlansoprazole (DEXILANT) 60 MG capsule Take 1 capsule (60 mg total) by mouth daily. 01/13/16   [place], Holli Humbles, NP  metFORMIN (GLUCOPHAGE) 500 MG tablet Take 500 mg by mouth daily.     [provider]    Allergies    Lisinopril  Review of Systems   Review of Systems  Respiratory: Negative for shortness of breath.   Cardiovascular: Negative for chest pain.  Musculoskeletal: Negative for neck pain.  Neurological: Positive for light-headedness and headaches. Negative for syncope.  Hematological: Does not bruise/bleed easily.  All other systems reviewed and are negative.   Physical Exam Updated Vital Signs BP 118/80 (BP Location: Left Arm)   Pulse (!) 120   Temp 97.6 F (36.4 C) (Oral)   Resp 18   Ht 5' 2" (1.575 m)   Wt 81.6 kg   LMP 05/22/2000   SpO2 98%   BMI 32.92 kg/m   Physical Exam Vitals and nursing note reviewed.  Constitutional:      General: She is not in acute distress.    Appearance: Normal appearance. She is well-developed. She is not ill-appearing.  HENT:     Head: Normocephalic.     Comments: ~13cm Large, deep laceration on the top of the scalp that extends from the top of forehead to just past  the crown with hematoma underneath and several small arteriole bleeds Eyes:     General: No scleral icterus.       Right eye: No discharge.        Left eye: No discharge.  Conjunctiva/sclera: Conjunctivae normal.     Pupils: Pupils are equal, round, and reactive to light.  Cardiovascular:     Rate and Rhythm: Tachycardia present.  Pulmonary:     Effort: Pulmonary effort is normal. No respiratory distress.     Breath sounds: Normal breath sounds.  Abdominal:     General: There is no distension.     Palpations: Abdomen is soft.     Tenderness: There is no abdominal tenderness.  Musculoskeletal:     Cervical back: Normal range of motion.  Skin:    General: Skin is warm and dry.  Neurological:     Mental Status: She is alert and oriented to person, place, and time.  Psychiatric:        Behavior: Behavior normal.       ED Results / Procedures / Treatments   Labs (all labs ordered are listed, but only abnormal results are displayed) Labs Reviewed  CBC WITH DIFFERENTIAL/PLATELET - Abnormal; Notable for the following components:      Result Value   RBC 3.69 (*)    Hemoglobin 11.5 (*)    MCV 100.3 (*)    All other components within normal limits  BASIC METABOLIC PANEL - Abnormal; Notable for the following components:   CO2 20 (*)    Glucose, Bld 136 (*)    Creatinine, Ser 1.54 (*)    GFR calc non Af Amer 34 (*)    GFR calc Af Amer 39 (*)    All other components within normal limits  RESPIRATORY PANEL BY RT PCR (FLU A&B, COVID)  PROTIME-INR  CBC  TYPE AND SCREEN    EKG None  Radiology CT Head Wo Contrast  Result Date: 06/05/2019 CLINICAL DATA:  Head trauma, headache, laceration. History of multiple myeloma EXAM: CT HEAD WITHOUT CONTRAST TECHNIQUE: Contiguous axial images were obtained from the base of the skull through the vertex without intravenous contrast. COMPARISON:  08/29/2017 FINDINGS: Brain: No evidence of acute infarction, hemorrhage, hydrocephalus,  extra-axial collection or mass lesion/mass effect. Scattered low-density changes within the periventricular and subcortical white matter compatible with chronic microvascular ischemic change. Mild diffuse cerebral volume loss. Vascular: Mild atherosclerotic calcifications involving the large vessels of the skull base. No unexpected hyperdense vessel. Skull: Innumerable lucent lesions throughout the bony calvarium, largest within the right frontal bone. Findings are stable in size and number compared to prior study and are compatible with patient's history of multiple myeloma. No calvarial fracture. Sinuses/Orbits: No acute finding. Other: Large scalp hematoma with laceration overlying the left frontal and parietal regions. IMPRESSION: 1. No acute intracranial abnormality. 2. Large scalp hematoma with laceration overlying the left frontal and parietal regions. No underlying calvarial fracture. 3. Chronic microvascular ischemic changes and cerebral volume loss. 4. Innumerable lucent lesions throughout the bony calvarium compatible with patient's history of multiple myeloma, stable in appearance from prior. Electronically Signed   By: Davina Poke D.O.   On: 06/05/2019 11:31    Procedures Procedures (including critical care time)  CRITICAL CARE Performed by: Recardo Evangelist   Total critical care time: 30 minutes  Critical care time was exclusive of separately billable procedures and treating other patients.  Critical care was necessary to treat or prevent imminent or life-threatening deterioration.  Critical care was time spent personally by me on the following activities: development of treatment plan with patient and/or surrogate as well as nursing, discussions with consultants, evaluation of patient's response to treatment, examination of patient, obtaining history from patient or surrogate, ordering  and performing treatments and interventions, ordering and review of laboratory studies,  ordering and review of radiographic studies, pulse oximetry and re-evaluation of patient's condition.   Medications Ordered in ED Medications  ceFAZolin (ANCEF) IVPB 2g/100 mL premix (has no administration in time range)  Tdap (BOOSTRIX) injection 0.5 mL (has no administration in time range)    ED Course  I have reviewed the triage vital signs and the nursing notes.  Pertinent labs & imaging results that were available during my care of the patient were reviewed by me and considered in my medical decision making (see chart for details).  74 year old female presents with large scalp laceration after head trauma today. She is tachycardic and BP is soft. Wound is hemorrhaging and bleeding has been difficult to control. Shared visit with Dr. Kathrynn Humble. The wound is gaping and there is a large hematoma underneath. At this time we feel we cannot repair this due to the severity. Will order labs, head CT, tetanus, fluid bolus. Will discuss with trauma surgery.  9:46 AM Pt is become more lightheaded. BP is 118/80 and she is still tachycardic. Fluid bolus is running. Surgery at bedside.  CBC remarkable for hgb of 11 which is slightly lower. She has CKD at baseline.   Surgery has repaired wound - they will admit for obs and to monitor hgb.  MDM Rules/Calculators/A&P                       Final Clinical Impression(s) / ED Diagnoses Final diagnoses:  Injury of head, initial encounter  Scalp laceration, initial encounter    Rx / DC Orders ED Discharge Orders    None       Recardo Evangelist, PA-C 06/05/19 [place], [place], MD 06/06/19 [phone]

## 2019-06-05 NOTE — Progress Notes (Signed)
Pt c/o HA and was medicated with 2 tylenol PO and given sprite to drink.  Pt states no vision problems at present.

## 2019-06-05 NOTE — Op Note (Signed)
Operative note  Preoperative diagnosis: Scalp laceration 16 cm Postoperative diagnosis: Scalp laceration 16 cm Procedure: Closure of scalp laceration 16 cm Anesthesia: Local Surgeon: Georganna Skeans, MD Procedure in detail: We will called urgently to see this patient due to active hemorrhage from a large scalp laceration.  Emergency consent.  The wound was prepped and irrigated.  Local anesthetic was injected.  I evacuated a large clot.  The wound had some active bleeding from small vessels which I controlled with 3-0 Vicryl sutures.  The wound was then closed with combination of 2-0 and 3-0 Prolene's in a simple fashion.  There was good hemostasis and she tolerated this well.  We placed a sterile dressing.  No complications. Disposition: Complete work-up in ED, admit for observation  Georganna Skeans, MD, MPH, FACS Please use [email or web address] to contact on call provider

## 2019-06-05 NOTE — ED Notes (Signed)
13 cm Lac to head

## 2019-06-05 NOTE — Plan of Care (Signed)
  Problem: Education: Goal: Required Educational Video(s) Outcome: Progressing   Problem: Clinical Measurements: Goal: Ability to maintain clinical measurements within normal limits will improve Outcome: Progressing Goal: Postoperative complications will be avoided or minimized Outcome: Progressing   Problem: Skin Integrity: Goal: Demonstration of wound healing without infection will improve Outcome: Progressing   

## 2019-06-05 NOTE — Discharge Instructions (Signed)
Laceration Care, Adult A laceration is a cut that may go through all layers of the skin. The cut may also go into the tissue that is right under the skin. Some cuts heal on their own. Others need to be closed with stitches (sutures), staples, skin adhesive strips, or skin glue. Taking care of your injury lowers your risk of infection, helps your injury to heal better, and may prevent scarring. Supplies needed:  Soap.  Water.  Hand sanitizer.  Bandage (dressing).  Antibiotic ointment.  Clean towel. How to take care of your cut Wash your hands with soap and water before touching your wound or changing your bandage. If soap and water are not available, use hand sanitizer. If your doctor used stitches or staples:  Keep the wound clean and dry.  If you were given a bandage, change it at least once a day as told by your doctor. You should also change it if it gets wet or dirty.  Keep the wound completely dry for the first 24 hours, or as told by your doctor. After that, you may take a shower or a bath. Do not get the wound soaked in water until after the stitches or staples have been removed.  Clean the wound once a day, or as told by your doctor: ? Wash the wound with soap and water. ? Rinse the wound with water to remove all soap. ? Pat the wound dry with a clean towel. Do not rub the wound.  After you clean the wound, put a thin layer of antibiotic ointment on it as told by your doctor. This ointment: ? Helps to prevent infection. ? Keeps the bandage from sticking to the wound.  Have your stitches or staples removed as told by your doctor. If your doctor used skin adhesive strips:  Keep the wound clean and dry.  If you were given a bandage, you should change it at least once a day as told by your doctor. You should also change it if it gets wet or dirty.  Do not get the skin adhesive strips wet. You can take a shower or a bath, but keep the wound dry.  If the wound gets wet,  pat it dry with a clean towel. Do not rub the wound.  Skin adhesive strips fall off on their own. You can trim the strips as the wound heals. Do not remove any strips that are still stuck to the wound. They will fall off after a while. If your doctor used skin glue:  Try to keep your wound dry, but you may briefly wet it in the shower or bath. Do not soak the wound in water, such as by swimming.  After you take a shower or a bath, gently pat the wound dry with a clean towel. Do not rub the wound.  Do not do any activities that will make you really sweaty until the skin glue has fallen off on its own.  Do not apply liquid, cream, or ointment medicine to your wound while the skin glue is still on.  If you were given a bandage, you should change it at least once a day or as told by your doctor. You should also change it if it gets dirty or wet.  If a bandage is placed over the wound, do not let the tape touch the skin glue.  Do not pick at the glue. The skin glue usually stays on for 5-10 days. Then, it falls off the skin. General   instructions   Take over-the-counter and prescription medicines only as told by your doctor.  If you were given antibiotic medicine or ointment, take or apply it as told by your doctor. Do not stop using it even if your condition improves.  Do not scratch or pick at the wound.  Check your wound every day for signs of infection. Watch for: ? Redness, swelling, or pain. ? Fluid, blood, or pus.  Raise (elevate) the injured area above the level of your heart while you are sitting or lying down.  If directed, put ice on the affected area: ? Put ice in a plastic bag. ? Place a towel between your skin and the bag. ? Leave the ice on for 20 minutes, 2-3 times a day.  Prevent scarring by covering your wound with sunscreen of at least 30 SPF whenever you are outside after your wound has healed.  Keep all follow-up visits as told by your doctor. This is  important. Get help if:  You got a tetanus shot and you have any of these problems at the injection site: ? Swelling. ? Very bad pain. ? Redness. ? Bleeding.  You have a fever.  A wound that was closed breaks open.  You notice a bad smell coming from your wound or your bandage.  You notice something coming out of the wound, such as wood or glass.  Medicine does not relieve your pain.  You have more redness, swelling, or pain at the site of your wound.  You have fluid, blood, or pus coming from your wound.  You notice a change in the color of your skin near your wound.  You need to change the bandage often because fluid, blood, or pus is coming from the wound.  You start to have a new rash.  You start to have numbness around the wound. Get help right away if:  You have very bad swelling around the wound.  Your pain suddenly gets worse and is very bad.  You notice painful lumps near the wound or anywhere on your body.  You have a red streak going away from your wound.  The wound is on your hand or foot, and: ? You cannot move a finger or toe. ? Your fingers or toes look pale or bluish. Summary  A laceration is a cut that may go through all layers of the skin. The cut may also go into the tissue right under the skin.  Some cuts heal on their own. Others need to be closed with stitches, staples, skin adhesive strips, or skin glue.  Follow your doctor's instructions for caring for your cut. Proper care of a cut lowers the risk of infection, helps the cut heal better, and prevents scarring. This information is not intended to replace advice given to you by your health care provider. Make sure you discuss any questions you have with your health care provider. Document Revised: 07/06/2017 Document Reviewed: 05/28/2017 Elsevier Patient Education  2020 [employer]  

## 2019-06-05 NOTE — ED Notes (Signed)
Trauma PA paged to [phone] Caryl Pina, RN paged by [name]

## 2019-06-06 DIAGNOSIS — S0101XA Laceration without foreign body of scalp, initial encounter: Secondary | ICD-10-CM | POA: Diagnosis not present

## 2019-06-06 LAB — CBC
HCT: 23.9 % — ABNORMAL LOW (ref 36.0–46.0)
HCT: 22.5 % — ABNORMAL LOW (ref 36.0–46.0)
Hemoglobin: 7.7 g/dL — ABNORMAL LOW (ref 12.0–15.0)
Hemoglobin: 7.4 g/dL — ABNORMAL LOW (ref 12.0–15.0)
MCH: 31.2 pg (ref 26.0–34.0)
MCH: 31.8 pg (ref 26.0–34.0)
MCHC: 32.2 g/dL (ref 30.0–36.0)
MCHC: 32.9 g/dL (ref 30.0–36.0)
MCV: 96.8 fL (ref 80.0–100.0)
MCV: 96.6 fL (ref 80.0–100.0)
Platelets: 242 10*3/uL (ref 150–400)
Platelets: 244 10*3/uL (ref 150–400)
RBC: 2.47 MIL/uL — ABNORMAL LOW (ref 3.87–5.11)
RBC: 2.33 MIL/uL — ABNORMAL LOW (ref 3.87–5.11)
RDW: 13.2 % (ref 11.5–15.5)
RDW: 13.2 % (ref 11.5–15.5)
WBC: 8.4 10*3/uL (ref 4.0–10.5)
WBC: 8.1 10*3/uL (ref 4.0–10.5)
nRBC: 0 % (ref 0.0–0.2)
nRBC: 0 % (ref 0.0–0.2)

## 2019-06-06 LAB — BASIC METABOLIC PANEL
Anion gap: 11 (ref 5–15)
BUN: 19 mg/dL (ref 8–23)
CO2: 20 mmol/L — ABNORMAL LOW (ref 22–32)
Calcium: 8.9 mg/dL (ref 8.9–10.3)
Chloride: 109 mmol/L (ref 98–111)
Creatinine, Ser: 1.69 mg/dL — ABNORMAL HIGH (ref 0.44–1.00)
GFR calc Af Amer: 35 mL/min — ABNORMAL LOW (ref >60)
GFR calc non Af Amer: 30 mL/min — ABNORMAL LOW (ref >60)
Glucose, Bld: 136 mg/dL — ABNORMAL HIGH (ref 70–99)
Potassium: 4.5 mmol/L (ref 3.5–5.1)
Sodium: 140 mmol/L (ref 135–145)

## 2019-06-06 LAB — GLUCOSE, CAPILLARY
Glucose-Capillary: 124 mg/dL — ABNORMAL HIGH (ref 70–99)
Glucose-Capillary: 136 mg/dL — ABNORMAL HIGH (ref 70–99)

## 2019-06-06 MED ORDER — ONDANSETRON 4 MG PO TBDP: 4.0000 mg | ORAL_TABLET | Freq: Four times a day (QID) | ORAL | 0 refills | Status: DC | PRN

## 2019-06-06 MED ORDER — POLYETHYLENE GLYCOL 3350 17 GM/SCOOP PO POWD: 1.0000 | Freq: Once | ORAL | 0 refills | Status: AC

## 2019-06-06 MED ORDER — FERROUS SULFATE 325 (65 FE) MG PO TABS: 325.0000 mg | ORAL_TABLET | Freq: Every day | ORAL | 3 refills | Status: DC

## 2019-06-06 MED ORDER — FERROUS SULFATE 325 (65 FE) MG PO TABS: 325.0000 mg | ORAL_TABLET | Freq: Every day | ORAL | Status: DC

## 2019-06-06 MED ORDER — BACITRACIN ZINC 500 UNIT/GM EX OINT: TOPICAL_OINTMENT | Freq: Two times a day (BID) | CUTANEOUS | 0 refills | Status: DC

## 2019-06-06 MED ORDER — IBUPROFEN 600 MG PO TABS: 600.0000 mg | ORAL_TABLET | Freq: Three times a day (TID) | ORAL | 0 refills | Status: DC | PRN

## 2019-06-06 MED ORDER — TRAMADOL HCL 50 MG PO TABS: 50.0000 mg | ORAL_TABLET | Freq: Four times a day (QID) | ORAL | 0 refills | Status: DC | PRN
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
## 2019-06-06 NOTE — Evaluation (Signed)
Occupational Therapy Evaluation and Discharge Patient Details Name: [name] MRN: [id number] DOB: [birth year] Today's Date: 06/06/2019    History of Present Illness Patient is a 74 year old female who was brought to the ED with scalp laceration. 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 PHMx: DM, breast CA   Clinical Impression   This 75 yo female admitted with above presents to acute OT at an overall minguard A-S level for basic ADLs presently and reports her daughter will be staying with her this weekend. Feel she will progress quickly back to her PLOF the more she is up on her feet. No further OT needs, we will sign off.    Follow Up Recommendations  No OT follow up;Supervision/Assistance - 24 hour    Equipment Recommendations  None recommended by OT       Precautions / Restrictions Precautions Precautions: Fall Precaution Comments: (P) unsteady due to head injury Restrictions Weight Bearing Restrictions: No      Mobility Bed Mobility     General bed mobility comments: Pt up in recliner upon arrival  Transfers Overall transfer level: Needs assistance Equipment used: None Transfers: Sit to/from Stand Sit to Stand: Supervision         General transfer comment: minguard A ambulating around the room    Balance Overall balance assessment: Mild deficits observed, not formally tested                                         ADL either performed or assessed with clinical judgement   ADL Overall ADL's : Needs assistance/impaired                                       General ADL Comments: S with minguard A when ambulating overall for basic ADLs. I did talk with patient about her not being able to shower until the bandage is changed on her head.     Vision Baseline Vision/History: Wears glasses Wears Glasses: Reading only Patient Visual Report: No  change from baseline              Pertinent Vitals/Pain Pain Assessment: 0-10 Pain Score: 6  Pain Location: headache Pain Descriptors / Indicators: Aching Pain Intervention(s): Monitored during session     Hand Dominance Right   Extremity/Trunk Assessment Upper Extremity Assessment Upper Extremity Assessment: Overall WFL for tasks assessed   Lower Extremity Assessment Lower Extremity Assessment: (P) Overall WFL for tasks assessed     Communication Communication Communication: No difficulties   Cognition Arousal/Alertness: Awake/alert Behavior During Therapy: WFL for tasks assessed/performed Overall Cognitive Status: Within Functional Limits for tasks assessed                                              Home Living Family/patient expects to be discharged to:: Private residence Living Arrangements: Alone Available Help at Discharge: Family;Available PRN/intermittently(pt reports her dtr will stay with her for the weekend) Type of Home: Apartment Home Access: Level entry     Home Layout: One level     Bathroom Shower/Tub: Tub/shower unit;Curtain   Biochemist, clinical:  Standard Bathroom Accessibility: No   Home Equipment: Walker - 2 wheels;Cane - quad;Grab bars - tub/shower          Prior Functioning/Environment Level of Independence: Independent                 OT Problem List: Impaired balance (sitting and/or standing);Pain         OT Goals(Current goals can be found in the care plan section) Acute Rehab OT Goals Patient Stated Goal: to hopefully go home today  OT Frequency:                AM-PAC OT "6 Clicks" Daily Activity     Outcome Measure Help from another person eating meals?: None Help from another person taking care of personal grooming?: A Little Help from another person toileting, which includes using toliet, bedpan, or urinal?: A Little Help from another person bathing (including washing, rinsing, drying)?: A  Little Help from another person to put on and taking off regular upper body clothing?: A Little Help from another person to put on and taking off regular lower body clothing?: A Little 6 Click Score: 19   End of Session    Activity Tolerance: Patient tolerated treatment well Patient left: in chair;with call bell/phone within reach;with chair alarm set  OT Visit Diagnosis: Unsteadiness on feet (R26.81);Pain Pain - part of body: (headache)                Time: [id number] OT Time Calculation (min): 14 min Charges:  OT General Charges $OT Visit: 1 Visit OT Evaluation $OT Eval Moderate Complexity: 1 Mod  Cathy  OTR/L Acute [employer] Pager [phone] Office [phone]     06/06/2019, 1:15 PM

## 2019-06-06 NOTE — TOC Transition Note (Signed)
Transition of Care [facility]) - CM/SW Discharge Note   Patient Details  Name: Marielena Marconi MRN: [id number] Date of Birth: 1945-05-26  Transition of Care [facility]) CM/SW Contact:  Ella Bodo, RN Phone Number: 06/06/2019, 4:28 PM   Clinical Narrative:  Patient is a 74 year old female who was brought to the ED with scalp laceration. 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    PTA, pt independent, lives at home alone.  Daughter plans to stay with her through the weekend.  PT recommending [place] follow up, and pt agreeable to services.  Referral to [facility] for HHPT.  Referral to [facility] for RW, to be delivered to bedside prior to dc.    SBIRT completed; pt denies ETOH use or need for SA resources.       Final next level of care: [place] Barriers to Discharge: Barriers Resolved   Patient Goals and CMS Choice Patient states their goals for this hospitalization and ongoing recovery are:: to get home CMS Medicare.gov Compare Post Acute Care list provided to:: Patient Choice offered to / list presented to : Patient           Discharge Plan and Services   Discharge Planning Services: CM Consult Post Acute Care Choice: Home Health          DME Arranged: Walker rolling DME Agency: [employer] Date DME Agency Contacted: 06/06/19 Time DME Agency Contacted: [id number] Representative spoke with at DME Agency: [place]: PT [place]: [place] Date [place]: 06/06/19 Time [place]: [place] Representative spoke with at [place]: [place] ([place]) Interventions     Readmission Risk Interventions No flowsheet data found.  Reinaldo Raddle, RN, BSN  Trauma/Neuro ICU Case Manager [phone]

## 2019-06-06 NOTE — Evaluation (Signed)
Physical Therapy Evaluation Patient Details Name: [name] MRN: [id number] DOB: 1945/06/17 Today's Date: 06/06/2019   History of Present Illness  Patient is a 74 year old female who was brought to the ED with scalp laceration. 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 PHMx: DM, breast CA  Clinical Impression  Pt admitted for above. She has deficits in strength, balance, coordination, gait, and overall functional mobility. She was able to complete bed mobility and transfers with supervision for safety, and min guard/min assist for gait as described below. She presents with some unsteadiness and drifting to the R when ambulating, neglecting a few right obstacles on the way. Balance could be impacted by L eye swelling from her injury. She states she has been gettign up by herself for the bathroom, so encouraged her to call for help and placed chair alarm. Recommend pt use RW at home for balance. Pt states that her daughter and friend will be there to help her if necessary. Called daughter, [name], and she confirmed this information. Recommend HH PT in order to improve balance and strength and promote safety in her home. She would benefit from continued skilled PT intervention in the acute setting in order to address deficits.     Follow Up Recommendations Home health PT;Supervision for mobility/OOB    Equipment Recommendations  Rolling walker with 5" wheels    Recommendations for Other Services       Precautions / Restrictions Precautions Precautions: Fall Precaution Comments: unsteady due to head injury Restrictions Weight Bearing Restrictions: No      Mobility  Bed Mobility Overal bed mobility: Needs Assistance Bed Mobility: Supine to Sit     Supine to sit: Supervision;HOB elevated     General bed mobility comments: Pt up in recliner upon arrival  Transfers Overall transfer level: Needs  assistance Equipment used: None Transfers: Sit to/from Stand Sit to Stand: Supervision         General transfer comment: minguard A ambulating around the room  Ambulation/Gait Ambulation/Gait assistance: Min assist;Min guard Gait Distance (Feet): 300 Feet Assistive device: Rolling walker (2 wheeled);None Gait Pattern/deviations: Staggering right;Drifts right/left;Wide base of support;Decreased stride length;Step-through pattern Gait velocity: decreased   General Gait Details: pt a little unsteady and drifts to the right often when walking; needed cueing to avoid objects on the right; able to right herself except for one mild LOB towards end of gait, and needed a little tug on gait belt for safety; unclear how much of balance difficulty was related to swollen L eye  Stairs            Wheelchair Mobility    Modified Rankin (Stroke Patients Only)       Balance Overall balance assessment: Mild deficits observed, not formally tested Sitting-balance support: No upper extremity supported;Feet unsupported Sitting balance-Leahy Scale: Normal Sitting balance - Comments: EOB mod I   Standing balance support: During functional activity;No upper extremity supported;Bilateral upper extremity supported Standing balance-Leahy Scale: Fair Standing balance comment: static standing good, dynamic fair, aided with use of RW               High Level Balance Comments: head turns in all directions; no unsteadiness or LOB but limited neck motion             Pertinent Vitals/Pain Pain Assessment: 0-10 Pain Score: 6  Pain Location: headache Pain Descriptors / Indicators: Aching Pain Intervention(s): Monitored  during session    [place] expects to be discharged to:: Private residence Living Arrangements: Alone Available Help at Discharge: Family;Available PRN/intermittently(pt reports her dtr will stay with her for the weekend) Type of Home: Apartment Home  Access: Level entry     Home Layout: One level Home Equipment: Cane - quad;Grab bars - tub/shower      Prior Function Level of Independence: Independent               Hand Dominance   Dominant Hand: Right    Extremity/Trunk Assessment   Upper Extremity Assessment Upper Extremity Assessment: Overall WFL for tasks assessed    Lower Extremity Assessment Lower Extremity Assessment: Overall WFL for tasks assessed    Cervical / Trunk Assessment Cervical / Trunk Assessment: Normal  Communication   Communication: No difficulties  Cognition Arousal/Alertness: Awake/alert Behavior During Therapy: WFL for tasks assessed/performed Overall Cognitive Status: Within Functional Limits for tasks assessed                                 General Comments: decreased safety awareness      General Comments      Exercises     Assessment/Plan    PT Assessment Patient needs continued PT services  PT Problem List Decreased strength;Decreased balance;Decreased mobility;Decreased coordination;Decreased knowledge of use of DME;Decreased safety awareness;Decreased knowledge of precautions;Pain       PT Treatment Interventions DME instruction;Gait training;Functional mobility training;Therapeutic activities;Therapeutic exercise;Stair training;Balance training;Neuromuscular re-education;Patient/family education;Modalities    PT Goals (Current goals can be found in the Care Plan section)  Acute Rehab PT Goals Patient Stated Goal: to hopefully go home today PT Goal Formulation: With patient Time For Goal Achievement: 06/20/19 Potential to Achieve Goals: Good    Frequency Min 5X/week   Barriers to discharge        Co-evaluation               AM-PAC PT "6 Clicks" Mobility  Outcome Measure Help needed turning from your back to your side while in a flat bed without using bedrails?: None Help needed moving from lying on your back to sitting on the side of a  flat bed without using bedrails?: None Help needed moving to and from a bed to a chair (including a wheelchair)?: A Little Help needed standing up from a chair using your arms (e.g., wheelchair or bedside chair)?: None Help needed to walk in hospital room?: A Little Help needed climbing 3-5 steps with a railing? : A Little 6 Click Score: 21    End of Session Equipment Utilized During Treatment: Gait belt Activity Tolerance: Patient tolerated treatment well Patient left: in chair;with call bell/phone within reach;with chair alarm set Nurse Communication: Mobility status PT Visit Diagnosis: Unsteadiness on feet (R26.81);Other abnormalities of gait and mobility (R26.89);Muscle weakness (generalized) (M62.81);Pain Pain - part of body: (head)    Time: 1132-1150 PT Time Calculation (min) (ACUTE ONLY): 18 min   Charges:   PT Evaluation $PT Eval Moderate Complexity: 1 Mod         Christel Mormon, SPT   Suhayla Chisom 06/06/2019, 1:42 PM

## 2019-06-06 NOTE — Discharge Summary (Signed)
Patient ID: [name] [id number] [birth year] 74 y.o.  Admit date: 06/05/2019 Discharge date: 06/06/2019  Admitting Diagnosis: Scalp laceration secondary to wheelchair falling on her head  Discharge Diagnosis Patient Active Problem List   Diagnosis Date Noted  . Scalp laceration 06/05/2019  . Lytic bone lesions on xray 03/14/2016  . Anemia of chronic disease 12/14/2015  . Chronic kidney disease, stage 3 12/01/2015  . Anorexia 12/01/2015  . Cough 12/01/2015  . Breast cancer, stage 1 ([place]) 09/09/2015  . Encounter for CDL (commercial driving license) exam [id number]  . Depression 04/11/2012  . Personal history of adenomatous colonic polyps 11/02/2011  . Degenerative joint disease involving multiple joints 03/29/2011  . Myeloma ([place]) 03/28/2011  ABL anemia  Consultants none  Reason for Admission: Patient is a 74 year old female who was brought to the ED with scalp laceration. 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 EMS struggled to control the bleeding and brought her to the ED. She denied significant pain initially but had some upon my examination. She complained of headache and some lightheadedness. She is alert and oriented and reports she did not sustain any other injuries. Denies pain elsewhere. She reported no other health conditions to me but chart lists Hx of HTN, T2DM, breast cancer and MM. She is currently in remission. She denies any daily medications. She reported NKDA, lisinopril listed as an allergy in chart. Patient reports she lives alone but has children. I spoke with her oldest daughter on the phone.  Reported tetanus was not up to date, tdap given in ED.   Procedures Scalp laceration repair, Dr. Grandville Silos 06/05/19  Hospital Course:  The patient was admitted following scalp laceration repair in the ED by Dr. Grandville Silos.  She was admitted for observation given how much blood she had  loss and for mobilization .  She worked with therapies who recommended HHPT.  This is being attempted to be set up but if unable then she will be set up for outpatient PT.  Her hgb drifted to 7.4, but stabilized there.  She will be started on iron as an outpatient along with stool softeners.  She was otherwise stable on HD 1 for discharge home.    Physical Exam: See PE from progress note earlier today.  Allergies as of 06/06/2019      Reactions   Lisinopril Other (See Comments), Cough   Throat itching      Medication List    TAKE these medications   acetaminophen 325 MG tablet Commonly known as: TYLENOL Take 650 mg by mouth every 6 (six) hours as needed.   bacitracin ointment Apply topically 2 (two) times daily. To scalp laceration   ferrous sulfate 325 (65 FE) MG tablet Take 1 tablet (325 mg total) by mouth daily with breakfast. Start taking on: June 07, 2019   ibuprofen 600 MG tablet Commonly known as: ADVIL Take 1 tablet (600 mg total) by mouth every 8 (eight) hours as needed for headache or moderate pain.   metFORMIN 500 MG tablet Commonly known as: GLUCOPHAGE Take 500 mg by mouth daily.   ondansetron 4 MG disintegrating tablet Commonly known as: ZOFRAN-ODT Take 1 tablet (4 mg total) by mouth every 6 (six) hours as needed for nausea.   polyethylene glycol powder 17 GM/SCOOP powder Commonly known as: GLYCOLAX/MIRALAX Take 255 g by mouth once for 1 dose.   traMADol 50 MG tablet  Commonly known as: ULTRAM Take 1 tablet (50 mg total) by mouth every 6 (six) hours as needed for moderate pain or severe pain.   Vitamin D3 125 MCG (5000 UT) Caps Take 5,000 Units by mouth daily.            Durable Medical Equipment  (From admission, onward)         Start     Ordered   06/06/19 1415  For home use only DME Walker rolling  Once    Question Answer Comment  Walker: With 5 Inch Wheels   Patient needs a walker to treat with the following condition Gait instability        06/06/19 1415           Follow-up Information    [facility] Follow up.   Why: call with questions Contact information: [place] [phone]       [name], MD Follow up in 3 week(s).   Specialty: Internal Medicine Why: as need for a wound check for your head Contact information: [place] [address] [phone]        [facility] [place] Follow up on 06/16/2019.   Specialty: General Surgery Why: Nurse only appointment to have your sutures removed from your head.  Your appointment is at 3:00pm, arrive by 2:30pm for paperwork and check in.  please bring your insurance card and photo ID Contact information: [place] [address] [phone]           Signed: Saverio Danker, [facility] 06/06/2019, 3:32 PM Please see Amion for pager number during day hours 7:00am-4:30pm

## 2019-06-06 NOTE — Progress Notes (Signed)
[name] to be D/C'd  per MD order. Discussed with the patient and all questions fully answered.    IV catheter discontinued intact. Site without signs and symptoms of complications. Dressing and pressure applied.  An After Visit Summary was printed and given to the patient. Patient received prescription.  D/c education completed with patient/family including follow up instructions, medication list, d/c activities limitations if indicated, with other d/c instructions as indicated by MD - patient able to verbalize understanding, all questions fully answered.   Patient instructed to return to ED, call 911, or call MD for any changes in condition.   Patient to be escorted via [place], and D/C home via private auto.

## 2019-06-06 NOTE — Care Management Obs Status (Signed)
[facility] NOTIFICATION   Patient Details  Name: [name] MRN: [id number] Date of Birth: [birth year]   Medicare Observation Status Notification Given:       Ella Bodo, RN 06/06/2019, 4:02 PM

## 2019-06-23 ENCOUNTER — Ambulatory Visit (INDEPENDENT_AMBULATORY_CARE_PROVIDER_SITE_OTHER): Payer: Medicare Other | Admitting: Neurology

## 2019-06-23 ENCOUNTER — Encounter: Payer: Self-pay | Admitting: Neurology

## 2019-06-23 ENCOUNTER — Other Ambulatory Visit: Payer: Self-pay

## 2019-06-23 VITALS — BP 147/88 | HR 94 | Temp 97.3°F | Ht 61.5 in | Wt 171.0 lb

## 2019-06-23 DIAGNOSIS — S069X1A Unspecified intracranial injury with loss of consciousness of 30 minutes or less, initial encounter: Secondary | ICD-10-CM | POA: Diagnosis not present

## 2019-06-23 DIAGNOSIS — S069X9A Unspecified intracranial injury with loss of consciousness of unspecified duration, initial encounter: Secondary | ICD-10-CM | POA: Insufficient documentation

## 2019-06-23 DIAGNOSIS — G3184 Mild cognitive impairment, so stated: Secondary | ICD-10-CM | POA: Diagnosis not present

## 2019-06-23 DIAGNOSIS — Z9484 Stem cells transplant status: Secondary | ICD-10-CM

## 2019-06-23 DIAGNOSIS — S098XXA Other specified injuries of head, initial encounter: Secondary | ICD-10-CM | POA: Diagnosis not present

## 2019-06-23 DIAGNOSIS — R4701 Aphasia: Secondary | ICD-10-CM

## 2019-06-23 DIAGNOSIS — C9001 Multiple myeloma in remission: Secondary | ICD-10-CM

## 2019-06-23 DIAGNOSIS — R2689 Other abnormalities of gait and mobility: Secondary | ICD-10-CM | POA: Diagnosis not present

## 2019-06-23 DIAGNOSIS — S069XAA Unspecified intracranial injury with loss of consciousness status unknown, initial encounter: Secondary | ICD-10-CM | POA: Insufficient documentation

## 2019-06-23 DIAGNOSIS — H53482 Generalized contraction of visual field, left eye: Secondary | ICD-10-CM | POA: Insufficient documentation

## 2019-06-23 DIAGNOSIS — G44311 Acute post-traumatic headache, intractable: Secondary | ICD-10-CM | POA: Insufficient documentation

## 2019-06-23 HISTORY — DX: Aphasia: R47.01

## 2019-06-23 HISTORY — DX: Unspecified intracranial injury with loss of consciousness status unknown, initial encounter: S06.9XAA

## 2019-06-23 NOTE — Patient Instructions (Signed)
Traumatic Brain Injury Traumatic brain injury (TBI) is an injury to the brain that results from:  A hard, direct blow to the head (closed injury).  An object penetrating the skull and entering the brain (open injury). Traumatic brain injury is also called a head injury or a concussion. TBI can be mild, moderate, or severe. What are the causes? Common causes of this condition include:  Falls.  Motor vehicle accidents.  Sports injuries.  Assaults. What increases the risk? You are more likely to develop this condition if you:  Are 75 years old or older.  Are a man.  Play contact sports, especially football, hockey, or soccer.  Are in the military.  Are a victim of violence.  Abuse drugs or alcohol.  Have had a previous TBI. What are the signs or symptoms? Symptoms may vary from person to person, and may include:  Loss of consciousness.  Headache.  Confusion.  Fatigue.  Changes in sleep.  Dizziness.  Mood or personality changes.  Memory problems.  Nausea or vomiting or both.  Seizures.  Clumsiness.  Slurred speech.  Depression and anxiety.  Anger.  Trouble concentrating, organizing, or making decisions.  Inability to control emotions or actions (impulse control).  Loss of or dulling of the senses, such as hearing, vision, and touch. This can include: ? Blurred vision. ? Ringing in your ears. How is this diagnosed? This condition may be diagnosed based on:  Medical history and physical exam.  Neurologic exam. This checks for brain and nervous system function, including your reflexes, memory, and coordination.  CT scan. Your TBI may be described as mild, moderate, or severe. How is this treated? Treatment depends on the severity of your brain injury and may include:  Breathing support (mechanical ventilation).  Blood pressure medicines.  Pain medicines.  Treatments to decrease the swelling in your brain.  Brain surgery. This may be  needed to: ? Remove a blood clot. ? Repair bleeding. ? Remove an object that has penetrated the brain, such as a skull fragment or a bullet. Treatment of TBI also includes:  Physical and mental rest.  Careful observation.  Medicine. You may be prescribed medicines to help with symptoms such as headaches, nausea, or difficulty sleeping.  Physical, occupational, and speech therapy.  Referral to a concussion clinic or rehabilitation center. Follow these instructions at home: Medicines  Take over-the-counter and prescription medicines only as told by your health care provider.  Do not take blood thinners (anticoagulants), aspirin, or other anti-inflammatory medicines such as ibuprofen or naproxen unless approved by your health care provider. Activity  Rest. Rest helps the brain to heal. Make sure you: ? Get plenty of sleep. Most adults should get 7-9 hours of sleep each night. ? Rest during the day. Take daytime naps or rest breaks when you feel tired.  Do not do high-risk activities that could cause a second concussion, such as riding a bike or playing sports. Having another concussion before the first one has healed can be dangerous.  Avoid a lot of visual stimulation. This includes work on the computer or phone, watching TV, and reading.  Ask your health care provider what kind of activities are safe for you. Your ability to react may be slower after a brain injury. Never do these activities if you are dizzy. Your health care provider will likely give you a plan for gradually returning to activities. General instructions  Do not drink alcohol.  Watch your symptoms and tell others to do the   same. Complications sometimes occur after a brain injury. Older adults with a brain injury may have a higher risk of serious complications.  Seek support from friends and family.  Keep all follow-up visits as directed by your health care provider. This is important. Contact a health care  provider if:  Your symptoms get worse or they do not improve.  You have new symptoms.  You have another injury. Get help right away if:  You have: ? Severe, persistent headaches that are not relieved by medicine. ? Weakness or numbness in any part of your body. ? Confusion. ? Slurred speech. ? Difficulty waking up. ? Nausea or persistent vomiting. ? A feeling like you are moving when you are not (vertigo). ? Seizures or you faint. ? Changes in your vision. ? Clear or bloody discharge from your nose or ears.  You cannot use your arms or legs normally. Summary  Traumatic brain injury happens when there is a hard, direct blow to the head or when an object penetrates the skull and enters the brain.  Traumatic brain injuries may be mild, moderate, or severe. Treatment depends on the severity of your injury.  Get help right away if you have a head injury and you develop seizures, confusion, vomiting, weakness in the arms or legs, slurred speech, and other symptoms.  Rest is one of the best treatments. Do not return to activity until your health care provider approves. This information is not intended to replace advice given to you by your health care provider. Make sure you discuss any questions you have with your health care provider. Document Revised: 09/12/2017 Document Reviewed: 06/25/2017 [employer] Patient Education  2020 [employer]  

## 2019-06-23 NOTE — Progress Notes (Signed)
SLEEP MEDICINE CLINIC    Provider:  Larey Seat, MD  Primary Care Physician:  [name], MD [place] [address]     Referring Provider: [name], [place] [address] [place],  [place] [address]          Chief Complaint according to patient   Patient presents with:    . New Patient (Initial Visit)     pt with daughter, she had a head injury on her job. since then she has continued to have decline in neuro status in certain areas. she has weakness on the left side. she is dragging left foot and having to now use a walker. went to [facility] had a CT which was normal, but lots of symptoms developed more after dc home. she is working with [facility] PT and [facility].      HISTORY OF PRESENT ILLNESS:  [name] is a 74 y.o. year old 19 or [place] American female patient seen here upon a referral  by dr Virgina Jock on 06/23/2019 .  Chief concern :   At the pleasure of meeting [name] today she is a 74 year old African-American right-handed female presents in the presence of her daughter.  The patient is established with [name].  He asked for an urgent visit with the patient due to the events of 05 June 2019 when she fell at home and suffered a significant scalp laceration.  The wound required several stitches which I can still appreciate, with total length of the wound is about 10 cm and she has still some edema to his or hematomata's changes to the higher left temporal lobe.  Her left eyebrow is a little lower and the upper eyelid on the left is swollen as well.  I understand that the patient has a history of lytic bone lesions on x-ray, anemia of chronic disease, chronic kidney stage III, anorexia, breast cancer stage I it was treated not with chemo but with radiation therapy.  She has a history of adenomatous colonic polyps degenerative joint disease and was diagnosed with a myeloma.  The myeloma has caused anemia and lytic bone lesions.  Reason for the admission the  patient works as a Recruitment consultant for special needs patient advised pulling a wheelchair lift down but it fell on her hand.  EMS was called struggle to control the bleeding she was brought to the emergency department to the emergency department she was alert and oriented the wound had to be surgically treated.  It could have had a temp tetanus vaccination was not up-to-date and a Tdap was given in the emergency room Dr. Grandville Silos handled the scalp laceration repair.  She was given back to see an appointment ibuprofen acetaminophen and continue to use Metformin, ondansetron, MiraLAX, was given tramadol for moderate pain and asked to continue with vitamin D3 supplements.  Saverio Danker, PA at [facility] surgery summarized the events.  The patient was observed overnight and discharged on 15 January after it was established that she had not dropped further hemoglobin or hematocrit, at the time of her discharge her hip hematocrit was 37 her hemoglobin 11.5 her white-red blood cell count 3.69.  White blood cell count 4.9.  A CT has been obtained it showed no acute intracranial abnormality left scalp hematoma with laceration and no underlying calvarial fracture.  Chronic microvascular ischemic changes and cerebral volume loss which is not unlikely to be seen for age innumerable lucent lesions throughout the bony calvarium with a  history of multiple myeloma.   [name] daughter states that her mother had since this event progressive memory issues, she would repeat things or forget things that were just told to her, the seem to be more short-term memory.  She also seems to not use her left extremities as well arm and leg seem to be weaker or sometimes as if she is not fully aware of them.  She developed some slurring of speech.  This was not present prior to the incident.       Review of Systems: Out of a complete 14 system review, the patient complains of only the following symptoms, and all other reviewed  systems are negative.:    Head and neck pain since the patient was hit by a very heavy object.  Laceration-  Suspected concussion, TBI given her symptoms. Balance has improved, slurred speech has improved, headaches are still bad.  She has been unable to use her cellphone, can't remember passwords or codes, and has been much less active.   Myeloma patient status post bone-marrow transplant.    Social History   Socioeconomic History  . Marital status: Legally Separated    Spouse name: Not on file  . Number of children: 3  . Years of education: Not on file  . Highest education level: Not on file  Occupational History  . Occupation: disability    Employer: RETIRED  Tobacco Use  . Smoking status: Never Smoker  . Smokeless tobacco: Never Used  . Tobacco comment: never used tobacco  Substance and Sexual Activity  . Alcohol use: No    Alcohol/week: 0.0 standard drinks  . Drug use: No  . Sexual activity: Not Currently  Other Topics Concern  . Not on file  Social History Narrative   Workouts:   Treadmill 3x/ week   Arm weights daily          Social Determinants of Health   Financial Resource Strain:   . Difficulty of Paying Living Expenses: Not on file  Food Insecurity:   . Worried About Charity fundraiser in the Last Year: Not on file  . Ran Out of Food in the Last Year: Not on file  Transportation Needs:   . Lack of Transportation (Medical): Not on file  . Lack of Transportation (Non-Medical): Not on file  Physical Activity:   . Days of Exercise per Week: Not on file  . Minutes of Exercise per Session: Not on file  Stress:   . Feeling of Stress : Not on file  Social Connections:   . Frequency of Communication with Friends and Family: Not on file  . Frequency of Social Gatherings with Friends and Family: Not on file  . Attends Religious Services: Not on file  . Active Member of Clubs or Organizations: Not on file  . Attends Archivist Meetings: Not on  file  . Marital Status: Not on file    Family History  Problem Relation Age of Onset  . Lung cancer Father        smoker  . Liver cancer Father   . Throat cancer Brother   . Esophageal cancer Brother   . Heart attack Mother   . Diabetes Mother   . Diabetes Sister   . Liver cancer Brother   . Pancreatic cancer Sister   . Rheum arthritis Sister   . Colon polyps Daughter   . Colon cancer Neg Hx   . Rectal cancer Neg Hx   . Stomach  cancer Neg Hx   . Breast cancer Neg Hx     Past Medical History:  Diagnosis Date  . Anemia   . Arthralgia    chronic, 2nd degree to stem cell transplantation  . Breast cancer ([place])   . Breast cancer, stage 1 ([place]) 09/09/2015  . Chronic pain of left knee   . Degenerative joint disease involving multiple joints 03/29/2011  . Diabetes mellitus without complication [facility])    Patient reports she hasnt had Diabetes in years  . DJD (degenerative joint disease)   . E. coli gastroenteritis   . GERD (gastroesophageal reflux disease)   . Herpes simplex esophagitis 2006  . History of blood transfusion    with first pregnancy  . History of multiple myeloma    chemo XRT, Dr. Marin Olp  . History of radiation therapy 03/16/16-05/02/16   right breast 50.4 Gy in 28 fractions, boost to 60.4 Gy in 5 fractions  . 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 HTN (hypertension) 2005  . Hyperlipidemia   . Myeloma ([place]) 03/28/2011  . OA (osteoarthritis)   . Paget's bone disease   . Personal history of adenomatous colonic polyps 11/02/2011   2 diminutive serrated adenomas 10/2011 Repeat colonoscopy 2018 (change from original recall based upon current guidelines 12/07/2014)    . PONV (postoperative nausea and vomiting)   . Radiation 09/28/2006 - 10/11/2006   skull treated to 1980 cGy by Dr. Elba Barman  . Renal insufficiency    H/O resolved  . Scalp laceration 06/05/2019  . Vitamin D deficiency     Past Surgical  History:  Procedure Laterality Date  . BONE MARROW TRANSPLANT  01/2005   [facility], chemo tx at [facility]-2006, radiation in 2008  . BREAST LUMPECTOMY Right 11/2015   radiation and chemo  . BREAST LUMPECTOMY WITH RADIOACTIVE SEED AND SENTINEL LYMPH NODE BIOPSY Right 09/22/2015   Procedure: BREAST LUMPECTOMY WITH RADIOACTIVE SEED AND SENTINEL LYMPH NODE BIOPSY;  Surgeon: Excell Seltzer, MD;  Location: [facility];  Service: General;  Laterality: Right;  . COLONOSCOPY    . IR REMOVAL TUN ACCESS W/ PORT W/O FL MOD SED  05/23/2018  . JOINT REPLACEMENT Left   . KNEE ARTHROSCOPY  05/2008   left  . TOTAL KNEE ARTHROPLASTY  10/11/2010   left     Current Outpatient Medications on File Prior to Visit  Medication Sig Dispense Refill  . acetaminophen (TYLENOL) 325 MG tablet Take 650 mg by mouth every 6 (six) hours as needed.    . bacitracin ointment Apply topically 2 (two) times daily. To scalp laceration 120 g 0  . Cholecalciferol (VITAMIN D3) 5000 units CAPS Take 5,000 Units by mouth daily.    . ferrous sulfate 325 (65 FE) MG tablet Take 1 tablet (325 mg total) by mouth daily with breakfast.  3  . metFORMIN (GLUCOPHAGE) 500 MG tablet Take 500 mg by mouth daily.   1   No current facility-administered medications on file prior to visit.    Allergies  Allergen Reactions  . Lisinopril Other (See Comments) and Cough    Throat itching    Physical exam:  Today's Vitals   06/23/19 1320  BP: (!) 147/88  Pulse: 94  Temp: (!) 97.3 F (36.3 C)  Weight: 171 lb (77.6 kg)  Height: 5' 1.5" (1.562 m)   Body mass index is 31.79 kg/m.   Wt Readings from Last 3 Encounters:  06/23/19 171  lb (77.6 kg)  06/05/19 175 lb (79.4 kg)  01/07/19 187 lb 8 oz (85 kg)     Ht Readings from Last 3 Encounters:  06/23/19 5' 1.5" (1.562 m)  06/05/19 5' 4" (1.626 m)  05/23/18 5' 2" (1.575 m)      General: The patient is awake, alert and appears not in acute distress. The patient is well  groomed. Head: traumatic injuries to scalp , laceration and surgcial treatment with multiple stitches, still swollen. . Neck is supple. neck circumference:14 inches . Nasal airflow congested  Retrognathia is  seen.  Dental status: N/a  Cardiovascular:  Regular rate and cardiac rhythm by pulse,  without distended neck veins. Respiratory: Lungs are clear to auscultation.  Skin:  Without evidence of ankle edema, or rash. Trunk: The patient's posture is erect.   Neurologic exam : The patient is awake and alert, oriented to place and time.   Memory subjective described as intact.  Attention span & concentration ability appears impaired.  Speech is o non-  fluent,  with dysarthria, dysphonia and stammering . aphasia.  Mood and affect are aloof.    Cranial nerves: loss of smell or taste reported -  Pupils are equal and briskly reactive to light.  Funduscopic exam deferred.   Extraocular movements in vertical and horizontal planes without nystagmus. She has not followed fullywith her left eye- Visual fields by finger perimetry are left sided impaired, partially due to swelling of the eyelid, ptosis,sec to edema ?Mar[facility] Kitchen Hematoma.  Hearing was intact to soft voice and finger rubbing.    Facial sensation intact to fine touch.  Facial motor strength is symmetric and tongue and uvula move midline.  Neck ROM : rotation, tilt and flexion extension were normal for age and shoulder shrug was symmetrical.    Motor exam:  Symmetric bulk, tone and ROM.   Normal tone without cog wheeling, symmetric grip strength .   Sensory:  Fine touch, pinprick and vibration were tested  and  normal.  Proprioception tested in the upper extremities was normal.   Coordination: Rapid alternating movements in the fingers/hands were of normal speed.  The Finger-to-nose maneuver was intact without evidence of ataxia, dysmetria or tremor.   Gait and station: Patient could not rise unassisted from a seated position, she walked with  a walker- al new since her injury. shuffling gait, she makes miniature steps and turned with 6-7 steps, she was extremely poorly balanced. She also almost ran into the left door frame , confirming her impaired peripheral vision.  Toe and heel walk were deferred.  Deep tendon reflexes: in the  upper and lower extremities are symmetrically attenuated, failure of relaxation. Babinski response was  Up-going on the left!        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     but her left sided abnormal reflex is not fitting.  Gait ataxia can relate to neck or brain injury.   2) shuffling gait, she makes miniature steps and turned with 6-7 steps, she was extremely poorly balanced.  She also almost ran into the left door frame , confirming her impaired peripheral vision.   3) cognitive impairment.    My Plan is to proceed with:  1) MRI  brain with and without gadoliinum.  This brain injury may not have been visible on CT at the time of ED visit.   2) I agree with ST, PT , OT - all at the home as arranged.  In addition:  she needs visual field examination.   Gait and balance training for fall prevention. Ruling out NPH.   I would like to thank [name], MD for allowing me to meet with and to take care of this pleasant patient.     Larey Seat, MD  Board certified In Neurology through the ABPN, Fellow of the [employer] of Neurology. Medical Director of [employer].

## 2019-06-25 ENCOUNTER — Telehealth: Payer: Self-pay | Admitting: Neurology

## 2019-06-25 NOTE — Telephone Encounter (Signed)
[employer] medicare/[place]ial [place] order sent to GI. No auth they will reach out to the patient to schedule.

## 2019-06-30 ENCOUNTER — Telehealth: Payer: Self-pay | Admitting: *Deleted

## 2019-06-30 NOTE — Telephone Encounter (Signed)
Dr Dohmeier agreed to [facility] papers for the daughter. Paperwork completed and signed by MD and placed on Debra's desk.

## 2019-06-30 NOTE — Telephone Encounter (Signed)
Pt daughter call, need FMLA paperwork completed  Asap, Pt daughter stated that she  needs to  be able to take her mom to her doctor appts this week. [phone].

## 2019-07-09 ENCOUNTER — Inpatient Hospital Stay: Payer: Medicare Other | Attending: Hematology & Oncology | Admitting: Hematology & Oncology

## 2019-07-09 ENCOUNTER — Encounter: Payer: Self-pay | Admitting: Hematology & Oncology

## 2019-07-09 ENCOUNTER — Inpatient Hospital Stay: Payer: Medicare Other

## 2019-07-09 ENCOUNTER — Other Ambulatory Visit: Payer: Self-pay

## 2019-07-09 VITALS — BP 176/98 | HR 109 | Temp 97.7°F | Resp 19 | Wt 169.0 lb

## 2019-07-09 DIAGNOSIS — Z923 Personal history of irradiation: Secondary | ICD-10-CM | POA: Insufficient documentation

## 2019-07-09 DIAGNOSIS — Z79899 Other long term (current) drug therapy: Secondary | ICD-10-CM | POA: Insufficient documentation

## 2019-07-09 DIAGNOSIS — Z171 Estrogen receptor negative status [ER-]: Secondary | ICD-10-CM | POA: Insufficient documentation

## 2019-07-09 DIAGNOSIS — C9001 Multiple myeloma in remission: Secondary | ICD-10-CM | POA: Insufficient documentation

## 2019-07-09 DIAGNOSIS — Z853 Personal history of malignant neoplasm of breast: Secondary | ICD-10-CM | POA: Insufficient documentation

## 2019-07-09 DIAGNOSIS — Z9221 Personal history of antineoplastic chemotherapy: Secondary | ICD-10-CM | POA: Insufficient documentation

## 2019-07-09 DIAGNOSIS — C9000 Multiple myeloma not having achieved remission: Secondary | ICD-10-CM

## 2019-07-09 DIAGNOSIS — C50911 Malignant neoplasm of unspecified site of right female breast: Secondary | ICD-10-CM

## 2019-07-09 DIAGNOSIS — Z7984 Long term (current) use of oral hypoglycemic drugs: Secondary | ICD-10-CM | POA: Insufficient documentation

## 2019-07-09 LAB — CMP (CANCER CENTER ONLY)
ALT: 11 U/L (ref 0–44)
AST: 14 U/L — ABNORMAL LOW (ref 15–41)
Albumin: 4.8 g/dL (ref 3.5–5.0)
Alkaline Phosphatase: 64 U/L (ref 38–126)
Anion gap: 13 (ref 5–15)
BUN: 13 mg/dL (ref 8–23)
CO2: 24 mmol/L (ref 22–32)
Calcium: 10.4 mg/dL — ABNORMAL HIGH (ref 8.9–10.3)
Chloride: 105 mmol/L (ref 98–111)
Creatinine: 1.74 mg/dL — ABNORMAL HIGH (ref 0.44–1.00)
GFR, Est AFR Am: 34 mL/min — ABNORMAL LOW (ref >60)
GFR, Estimated: 29 mL/min — ABNORMAL LOW (ref >60)
Glucose, Bld: 129 mg/dL — ABNORMAL HIGH (ref 70–99)
Potassium: 3.5 mmol/L (ref 3.5–5.1)
Sodium: 142 mmol/L (ref 135–145)
Total Bilirubin: 0.3 mg/dL (ref 0.3–1.2)
Total Protein: 8 g/dL (ref 6.5–8.1)

## 2019-07-09 LAB — CBC WITH DIFFERENTIAL (CANCER CENTER ONLY)
Abs Immature Granulocytes: 0.01 10*3/uL (ref 0.00–0.07)
Basophils Absolute: 0 10*3/uL (ref 0.0–0.1)
Basophils Relative: 1 %
Eosinophils Absolute: 0.1 10*3/uL (ref 0.0–0.5)
Eosinophils Relative: 2 %
HCT: 36.7 % (ref 36.0–46.0)
Hemoglobin: 11.5 g/dL — ABNORMAL LOW (ref 12.0–15.0)
Immature Granulocytes: 0 %
Lymphocytes Relative: 27 %
Lymphs Abs: 1.2 10*3/uL (ref 0.7–4.0)
MCH: 30.4 pg (ref 26.0–34.0)
MCHC: 31.3 g/dL (ref 30.0–36.0)
MCV: 97.1 fL (ref 80.0–100.0)
Monocytes Absolute: 0.3 10*3/uL (ref 0.1–1.0)
Monocytes Relative: 7 %
Neutro Abs: 2.8 10*3/uL (ref 1.7–7.7)
Neutrophils Relative %: 63 %
Platelet Count: 347 10*3/uL (ref 150–400)
RBC: 3.78 MIL/uL — ABNORMAL LOW (ref 3.87–5.11)
RDW: 13.2 % (ref 11.5–15.5)
WBC Count: 4.4 10*3/uL (ref 4.0–10.5)
nRBC: 0 % (ref 0.0–0.2)

## 2019-07-09 MED ORDER — DRONABINOL 2.5 MG PO CAPS: 2.5000 mg | ORAL_CAPSULE | Freq: Two times a day (BID) | ORAL | 0 refills | Status: DC

## 2019-07-09 NOTE — Progress Notes (Signed)
Hematology and Oncology Follow Up Visit  [place] [id number] 1945-06-11 74 y.o. 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 Radiation therapy to the right breast - completed in December 2017 Status post autologous stem cell transplant for myeloma at [facility]         in October 2006.    Interim History:  Ms. Battenfield is here today for follow-up.  I am absolutely horrified as to what happened to her.  About 5 weeks ago, she incurred an accident at work.  Apparently, a wheelchair fell on top of her.  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  Apparently, a wheelchair lift did not work properly.  A wheelchair fell on top of her.  She incurred a large laceration on the scalp.  This required 20 2 sutures.  She has weakness over on her left leg.  She is using a walker right now.  Again I am just amazed as to what happened.  Thankfully she is still alive.  I just hope that this does not cause her myeloma to recur.  Her last myeloma studies that were done last year did not show a monoclonal spike in her blood.  She has had no obvious change in bowel or bladder habits.  She has had no cough.  She has had no leg swelling.  Overall, her performance status right now is ECOG 2.   Medications:  Allergies as of 07/09/2019      Reactions   Lisinopril Other (See Comments), Cough   Throat itching      Medication List       Accurate as of July 09, 2019 10:35 AM. If you have any questions, ask your nurse or doctor.        acetaminophen 325 MG tablet Commonly known as: TYLENOL Take 650 mg by mouth every 6 (six) hours as needed.   bacitracin ointment Apply topically 2 (two) times daily. To scalp  laceration   ferrous sulfate 325 (65 FE) MG tablet Take 1 tablet (325 mg total) by mouth daily with breakfast.   metFORMIN 500 MG tablet Commonly known as: GLUCOPHAGE Take 500 mg by mouth daily.   Vitamin D3 125 MCG (5000 UT) Caps Take 5,000 Units by mouth daily.       Allergies:  Allergies  Allergen Reactions  . Lisinopril Other (See Comments) and Cough    Throat itching    Past Medical History, Surgical history, Social history, and Family History were reviewed and updated.  Review of Systems: Review of Systems  Constitutional: Negative.   HENT: Negative.   Eyes: Negative.   Respiratory: Negative.   Cardiovascular: Negative.   Gastrointestinal: Negative.   Genitourinary: Negative.   Musculoskeletal: Negative.   Skin: Negative.   Neurological: Negative.   Endo/Heme/Allergies: Negative.   Psychiatric/Behavioral: Negative.       Physical Exam:  weight is 169 lb (76.7 kg). Her temporal temperature is 97.7 F (36.5 C). Her blood pressure is 176/98 (abnormal) and her pulse is 109 (abnormal). Her respiration is 19 and oxygen saturation is 98%.   Wt Readings from Last 3 Encounters:  07/09/19 169 lb (76.7 kg)  06/23/19 171 lb (77.6 kg)  06/05/19 175 lb (79.4 kg)    Physical Exam Vitals reviewed.  HENT:     Head: Normocephalic and atraumatic.  Eyes:     Pupils: Pupils are equal, round, and reactive to light.  Cardiovascular:     Rate and Rhythm: Normal rate and regular rhythm.     Heart sounds: Normal heart sounds.  Pulmonary:     Effort: Pulmonary effort is normal.     Breath sounds: Normal breath sounds.  Abdominal:     General: Bowel sounds are normal.     Palpations: Abdomen is soft.  Musculoskeletal:        General: No tenderness or deformity. Normal range of motion.     Cervical back: Normal range of motion.  Lymphadenopathy:     Cervical: No cervical adenopathy.  Skin:    General: Skin is warm and dry.     Findings: No erythema or rash.    Neurological:     Mental Status: She is alert and oriented to person, place, and time.  Psychiatric:        Behavior: Behavior normal.        Thought Content: Thought content normal.        Judgment: Judgment normal.    Lab Results  Component Value Date   WBC 4.4 07/09/2019   HGB 11.5 (L) 07/09/2019   HCT 36.7 07/09/2019   MCV 97.1 07/09/2019   PLT 347 07/09/2019   Lab Results  Component Value Date   FERRITIN 2,980 (H) 12/01/2015   IRON 26 (L) 12/01/2015   TIBC 143 (L) 12/01/2015   UIBC 117 12/01/2015   IRONPCTSAT 18 12/01/2015   Lab Results  Component Value Date   RETICCTPCT <0.4 (L) 12/01/2015   RBC 3.78 (L) 07/09/2019   Lab Results  Component Value Date   KPAFRELGTCHN 41.8 (H) 01/07/2019   LAMBDASER 23.3 01/07/2019   KAPLAMBRATIO 1.79 (H) 01/07/2019   Lab Results  Component Value Date   IGGSERUM 1,394 01/07/2019   IGA 374 01/07/2019   IGMSERUM 123 01/07/2019   Lab Results  Component Value Date   TOTALPROTELP 7.6 01/07/2019   ALBUMINELP 3.5 01/07/2019   A1GS 0.2 01/07/2019   A2GS 1.0 01/07/2019   BETS 1.4 (H) 01/07/2019   BETA2SER 0.5 03/08/2015   GAMS 1.4 01/07/2019   MSPIKE Not Observed 01/07/2019   SPEI * 03/08/2015     Chemistry      Component Value Date/Time   NA 142 07/09/2019 0948   NA 141 11/28/2016 0908   NA 142 10/22/2015 0950   K 3.5 07/09/2019 0948   K 4.0 11/28/2016 0908   K 4.2 10/22/2015 0950   CL 105 07/09/2019 0948   CL 106 11/28/2016 0908   CO2 24 07/09/2019 0948   CO2 26 11/28/2016 0908   CO2 24 10/22/2015 0950   BUN 13 07/09/2019 0948   BUN 12 11/28/2016 0908   BUN 14.0 10/22/2015 0950   CREATININE 1.74 (H) 07/09/2019 0948   CREATININE 1.6 (H) 11/28/2016 0908   CREATININE 1.6 (H) 10/22/2015 0950      Component Value Date/Time   CALCIUM 10.4 (H) 07/09/2019 0948   CALCIUM 9.6 11/28/2016 0908   CALCIUM 9.4 10/22/2015 0950   ALKPHOS 64 07/09/2019 0948   ALKPHOS 89 (H) 11/28/2016 0908   ALKPHOS 79 10/22/2015 0950    AST 14 (L) 07/09/2019 0948   AST 35 (H) 10/22/2015 0950   ALT 11 07/09/2019 0948   ALT 40  11/28/2016 0908   ALT 41 10/22/2015 0950   BILITOT 0.3 07/09/2019 0948   BILITOT 0.30 10/22/2015 0950     Impression and Plan: Ms. Vonasek is a 74 yo African American female with IgG Kappa myeloma - remission s/p ASCT in May 2006.  She also stage Ia triple negative breast cancer of the right breast. She has completed chemo and radiation.  She completed over treatment back in December 2017.   I do not really feel bad for her.  I am not sure if she is going to have any count of permanent problems with this accident.  I will call in some Marinol for her.  Her appetite is down quite a bit.  This certainly could be a residual from this accident that she had.  I would like to see her back in 3 months.  I want to have her come back little bit sooner just because of what has happened to her.  Thankfully, I do not think there will be a problem with myeloma or breast cancer.  However, with stress like this it is certainly conceivable that there might be a recurrence of one of the other malignancy.    I had to spend about 35 minutes with her today.  I just was not aware that she had this bad accident.  I wanted to take time to talk to her and try to figure out how we can follow her along and make sure that we do our part for her.     [name], MD 2/17/202110:35 AM

## 2019-07-10 ENCOUNTER — Other Ambulatory Visit: Payer: Medicare Other

## 2019-07-10 LAB — KAPPA/LAMBDA LIGHT CHAINS
Kappa free light chain: 37.2 mg/L — ABNORMAL HIGH (ref 3.3–19.4)
Kappa, lambda light chain ratio: 1.58 (ref 0.26–1.65)
Lambda free light chains: 23.6 mg/L (ref 5.7–26.3)

## 2019-07-10 LAB — IGG, IGA, IGM
IgA: 369 mg/dL (ref 64–422)
IgG (Immunoglobin G), Serum: 1170 mg/dL (ref 586–1602)
IgM (Immunoglobulin M), Srm: 105 mg/dL (ref 26–217)

## 2019-07-11 LAB — PROTEIN ELECTROPHORESIS, SERUM, WITH REFLEX
A/G Ratio: 1 (ref 0.7–1.7)
Albumin ELP: 4.1 g/dL (ref 2.9–4.4)
Alpha-1-Globulin: 0.3 g/dL (ref 0.0–0.4)
Alpha-2-Globulin: 1.2 g/dL — ABNORMAL HIGH (ref 0.4–1.0)
Beta Globulin: 1.6 g/dL — ABNORMAL HIGH (ref 0.7–1.3)
Gamma Globulin: 1.3 g/dL (ref 0.4–1.8)
Globulin, Total: 4.3 g/dL — ABNORMAL HIGH (ref 2.2–3.9)
Total Protein ELP: 8.4 g/dL (ref 6.0–8.5)

## 2019-07-17 ENCOUNTER — Ambulatory Visit
Admission: RE | Admit: 2019-07-17 | Discharge: 2019-07-17 | Disposition: A | Discharge status: Home / Self Care | Payer: Medicare Other | Source: Ambulatory Visit | Attending: Neurology | Admitting: Neurology

## 2019-07-17 DIAGNOSIS — R2689 Other abnormalities of gait and mobility: Secondary | ICD-10-CM

## 2019-07-17 DIAGNOSIS — R4701 Aphasia: Secondary | ICD-10-CM | POA: Diagnosis not present

## 2019-07-17 DIAGNOSIS — S069X1A Unspecified intracranial injury with loss of consciousness of 30 minutes or less, initial encounter: Secondary | ICD-10-CM

## 2019-07-17 DIAGNOSIS — G44311 Acute post-traumatic headache, intractable: Secondary | ICD-10-CM

## 2019-07-17 DIAGNOSIS — G3184 Mild cognitive impairment, so stated: Secondary | ICD-10-CM

## 2019-07-17 DIAGNOSIS — H53482 Generalized contraction of visual field, left eye: Secondary | ICD-10-CM

## 2019-07-17 MED ORDER — GADOBENATE DIMEGLUMINE 529 MG/ML IV SOLN
8.0000 mL | Freq: Once | INTRAVENOUS | Status: AC | PRN
  Administered 2019-07-17: 8 mL via INTRAVENOUS

## 2019-07-18 NOTE — Progress Notes (Signed)
Multiple subacute or non-acute ischaemic strokes were seen. Left occipital could affect vision and is the most recent stroke/ 19-71 month old by description.  Skull changes were consistent with history of myeloma.  Brain atrophy advanced for age.

## 2019-07-21 ENCOUNTER — Encounter: Payer: Self-pay | Admitting: Neurology

## 2019-07-21 ENCOUNTER — Telehealth: Payer: Self-pay | Admitting: Adult Health

## 2019-07-21 ENCOUNTER — Telehealth: Payer: Self-pay | Admitting: Neurology

## 2019-07-21 NOTE — Telephone Encounter (Signed)
Daughter returned call and I reviewed the MRI brain results with her. The patient cognition is still very declined. The patient was unable to understand what I was stating. Advised the patient's daughter that there was areas on the brain that indicated old strokes 3-6 months old were present on the brain.  She is stating that between the cognition and the weakness and decreased with strength and vision she needs a letter indicating that she can not drive. This is something to be in writing more so for the patient because she is wanting to get out and drive and the daughter knows that she should not. She is walking with a walker still and she is requesting if handicap card would also be signed for the patient. Advised I would have to bring both of those questions to Dr Dohmeier's attention. Patient states that she will be able to pick these up on wed if Dr Dohmeier agrees to complete. Advised I would also gather some education material to help with prevention of further strokes. She was appreciative.

## 2019-07-21 NOTE — Telephone Encounter (Signed)
-----   Message from Larey Seat, MD sent at 07/18/2019  2:20 PM EST ----- Multiple subacute or non-acute ischaemic strokes were seen. Left occipital could affect vision and is the most recent stroke/ 58-38 month old by description.  Skull changes were consistent with history of myeloma.  Brain atrophy advanced for age.

## 2019-07-21 NOTE — Telephone Encounter (Signed)
error 

## 2019-07-21 NOTE — Telephone Encounter (Signed)
Called the patient and was able to review the MRI results with the patient. I began to educate the patient on ways to prevent any future strokes. As we were talking the phone call dropped.  I called the patient back and it went to VM. LVM asking the patient to call back.

## 2019-07-23 ENCOUNTER — Telehealth: Payer: Self-pay | Admitting: Neurology

## 2019-07-23 NOTE — Telephone Encounter (Signed)
Pt's daughter stopped by today to have FMLA paperwork filled out and would like to see if her mother could have an appointment sooner than her 04/01 to follow up on MRI results. Please advise at [phone].

## 2019-07-23 NOTE — Telephone Encounter (Signed)
Called the patient back and offered Monday opening at 3:30 pm. Patient daughter has to check with her supervisor and she will return call first thing in am. We will also review the paperwork at the time of visit  If daughter calls back 3/8 apt 3:30 is held for the patient.

## 2019-07-24 ENCOUNTER — Encounter: Payer: Self-pay | Admitting: Neurology

## 2019-07-24 DIAGNOSIS — Z0289 Encounter for other administrative examinations: Secondary | ICD-10-CM

## 2019-07-28 ENCOUNTER — Ambulatory Visit (INDEPENDENT_AMBULATORY_CARE_PROVIDER_SITE_OTHER): Payer: Medicare Other | Admitting: Neurology

## 2019-07-28 ENCOUNTER — Encounter: Payer: Self-pay | Admitting: Neurology

## 2019-07-28 ENCOUNTER — Other Ambulatory Visit: Payer: Self-pay

## 2019-07-28 VITALS — BP 164/90 | HR 96 | Temp 97.4°F | Ht 63.5 in | Wt 166.0 lb

## 2019-07-28 DIAGNOSIS — S098XXA Other specified injuries of head, initial encounter: Secondary | ICD-10-CM | POA: Diagnosis not present

## 2019-07-28 DIAGNOSIS — I69320 Aphasia following cerebral infarction: Secondary | ICD-10-CM

## 2019-07-28 DIAGNOSIS — H53482 Generalized contraction of visual field, left eye: Secondary | ICD-10-CM

## 2019-07-28 DIAGNOSIS — Z8673 Personal history of transient ischemic attack (TIA), and cerebral infarction without residual deficits: Secondary | ICD-10-CM

## 2019-07-28 DIAGNOSIS — R4701 Aphasia: Secondary | ICD-10-CM

## 2019-07-28 NOTE — Progress Notes (Addendum)
[facility]    Provider:  Larey Seat, MD  Primary Care Physician:  [name], MD [place] [address]     Referring Provider: [name], [place] [employer],  [name] [address]          Chief Complaint according to patient   Patient presents with:    . New Patient (Initial Visit)     pt with daughter, she had a head injury on her job. since then she has continued to have decline in neuro status in certain areas. she has weakness on the left side. she is dragging left foot and having to now use a walker. went to [facility] had a CT which was normal, but lots of symptoms developed more after dc home. she is working with [facility].      [name]- [name] is a 74 y.o. year old [place] American female patient seen here for [name]  by [name] on 07/28/2019.Patient  originally seen for work related concussion-  I had ordered an MRI with and without contrast to evaluate her brain after I found evidence of homonymous hemianopsia, and aphasia when we first met.  This MRI was performed and compared to a CT of the head obtained in the emergency room on 06-05-2019.  The MRI revealed much more than the CT the brain parenchyma showed multiple right frontoparietal subcortical patchy lesions which are also seen in flair and indicate late and subacute infarcts.  There is a left occipital infarct which does not show deep to accentuation and is likely a more recent infarct.  There are age-appropriate changes of chronic small vessel disease, subarachnoid spaces and ventricular system shows slight agitation.  Postcontrast images did not result in areas of acute abnormality and enhancement.   The conclusion was that this is an abnormal MRI showing multiple strokes involving over time.  This does not mean that she could have had a cluster of strokes at the same time and later another set of strokes.   However I am much more concerned about her aphasia being a result of left  sided temporoparietal changes and the left occipital infarct as described by Dr. [name] being related to the homonymous hemianopsia.    The symptoms did not according to the family present before her head trauma.  Her skull show structure changes consistent with a history of myeloma and patchy lesions.  There is some brain atrophy.  Her gait is improving as she is continue with physical therapy and her speech has become more fluent.  However it is very unlikely that the visual fields could recover. The patient has had PT and ST for the last 2 weeks and improved gait and fluency of speech.       HISTORY OF PRESENT ILLNESS:  2-1-2021_ Chief concern :   At the pleasure of meeting [name] today she is a 74 year old African-American right-handed female presents in the presence of her daughter.  The patient is established with [name].  He asked for an urgent visit with the patient due to the events of 05 June 2019 when she fell at home and suffered a significant scalp laceration.  The wound required several stitches which I can still appreciate, with total length of the wound is about 10 cm and she has still some edema to his or hematomata's changes to the higher left temporal lobe.  Her left eyebrow is a little lower and the upper eyelid  on the left is swollen as well.  I understand that the patient has a history of lytic bone lesions on x-ray, anemia of chronic disease, chronic kidney stage III, anorexia, breast cancer stage I it was treated not with chemo but with radiation therapy.  She has a history of adenomatous colonic polyps degenerative joint disease and was diagnosed with a myeloma.  The myeloma has caused anemia and lytic bone lesions.  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  EMS was called struggle to control the bleeding she was brought to the emergency department to the  emergency department she was alert and oriented the wound had to be surgically treated.  It could have had a temp tetanus vaccination was not up-to-date and a Tdap was given in the emergency room Dr. Grandville Silos handled the scalp laceration repair.  She was given back to see an appointment ibuprofen acetaminophen and continue to use Metformin, ondansetron, MiraLAX, was given tramadol for moderate pain and asked to continue with vitamin D3 supplements.  Saverio Danker, PA at [facility] surgery summarized the events.  The patient was observed overnight and discharged on 15 January after it was established that she had not dropped further hemoglobin or hematocrit, at the time of her discharge her hip hematocrit was 37 her hemoglobin 11.5 her white-red blood cell count 3.69.  White blood cell count 4.9.  A CT has been obtained it showed no acute intracranial abnormality left scalp hematoma with laceration and no underlying calvarial fracture.  Chronic microvascular ischemic changes and cerebral volume loss which is not unlikely to be seen for age innumerable lucent lesions throughout the bony calvarium with a history of multiple myeloma.   [name] daughter states that her mother had since this event progressive memory issues, she would repeat things or forget things that were just told to her, the seem to be more short-term memory.  She also seems to not use her left extremities as well arm and leg seem to be weaker or sometimes as if she is not fully aware of them.  She developed some slurring of speech.  This was not present prior to the incident.       Review of Systems: Out of a complete 14 system review, the patient complains of only the following symptoms, and all other reviewed systems are negative.:    Head and neck pain since the patient was hit by a very heavy object.  Laceration-  Suspected concussion, TBI given her symptoms. Balance has improved, slurred speech has improved, headaches  are still bad.  She has been unable to use her cellphone, can't remember passwords or codes, and has been much less active.   Myeloma patient status post bone-marrow transplant.    Still not able to use her phone as she did well and lived completely dependent before the  Work- related accident on 06-05-2019.   Social History   Socioeconomic History  . Marital status: Legally Separated    Spouse name: Not on file  . Number of children: 3  . Years of education: Not on file  . Highest education level: Not on file  Occupational History  . Occupation: disability    Employer: RETIRED  Tobacco Use  . Smoking status: Never Smoker  . Smokeless tobacco: Never Used  . Tobacco comment: never used tobacco  Substance and Sexual Activity  . Alcohol use:  No    Alcohol/week: 0.0 standard drinks  . Drug use: No  . Sexual activity: Not Currently  Other Topics Concern  . Not on file  Social History Narrative   Workouts:   Treadmill 3x/ week   Arm weights daily          Social Determinants of Health   Financial Resource Strain:   . Difficulty of Paying Living Expenses: Not on file  Food Insecurity:   . Worried About Charity fundraiser in the Last Year: Not on file  . Ran Out of Food in the Last Year: Not on file  Transportation Needs:   . Lack of Transportation (Medical): Not on file  . Lack of Transportation (Non-Medical): Not on file  Physical Activity:   . Days of Exercise per Week: Not on file  . Minutes of Exercise per Session: Not on file  Stress:   . Feeling of Stress : Not on file  Social Connections:   . Frequency of Communication with Friends and Family: Not on file  . Frequency of Social Gatherings with Friends and Family: Not on file  . Attends Religious Services: Not on file  . Active Member of Clubs or Organizations: Not on file  . Attends Archivist Meetings: Not on file  . Marital Status: Not on file    Family History  Problem Relation Age of Onset    . Lung cancer Father        smoker  . Liver cancer Father   . Throat cancer Brother   . Esophageal cancer Brother   . Heart attack Mother   . Diabetes Mother   . Diabetes Sister   . Liver cancer Brother   . Pancreatic cancer Sister   . Rheum arthritis Sister   . Colon polyps Daughter   . Colon cancer Neg Hx   . Rectal cancer Neg Hx   . Stomach cancer Neg Hx   . Breast cancer Neg Hx     Past Medical History:  Diagnosis Date  . Anemia   . Arthralgia    chronic, 2nd degree to stem cell transplantation  . Breast cancer ([place])   . Breast cancer, stage 1 ([place]) 09/09/2015  . Chronic pain of left knee   . Degenerative joint disease involving multiple joints 03/29/2011  . Diabetes mellitus without complication [facility])    Patient reports she hasnt had Diabetes in years  . DJD (degenerative joint disease)   . E. coli gastroenteritis   . GERD (gastroesophageal reflux disease)   . Herpes simplex esophagitis 2006  . History of blood transfusion    with first pregnancy  . History of multiple myeloma    chemo XRT, Dr. Marin Olp  . History of radiation therapy 03/16/16-05/02/16   right breast 50.4 Gy in 28 fractions, boost to 60.4 Gy in 5 fractions  . 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 HTN (hypertension) 2005  . Hyperlipidemia   . Myeloma ([place]) 03/28/2011  . OA (osteoarthritis)   . Paget's bone disease   . Personal history of adenomatous colonic polyps 11/02/2011   2 diminutive serrated adenomas 10/2011 Repeat colonoscopy 2018 (change from original recall based upon current guidelines 12/07/2014)    . PONV (postoperative nausea and vomiting)   . Radiation 09/28/2006 - 10/11/2006   skull treated to 1980 cGy by Dr. Elba Barman  . Renal insufficiency    H/O resolved  . Scalp laceration  06/05/2019  . Vitamin D deficiency     Past Surgical History:  Procedure Laterality Date  . BONE MARROW TRANSPLANT  01/2005   [facility], chemo tx at  [facility]-2006, radiation in 2008  . BREAST LUMPECTOMY Right 11/2015   radiation and chemo  . BREAST LUMPECTOMY WITH RADIOACTIVE SEED AND SENTINEL LYMPH NODE BIOPSY Right 09/22/2015   Procedure: BREAST LUMPECTOMY WITH RADIOACTIVE SEED AND SENTINEL LYMPH NODE BIOPSY;  Surgeon: Excell Seltzer, MD;  Location: [facility];  Service: General;  Laterality: Right;  . COLONOSCOPY    . IR REMOVAL TUN ACCESS W/ PORT W/O FL MOD SED  05/23/2018  . JOINT REPLACEMENT Left   . KNEE ARTHROSCOPY  05/2008   left  . TOTAL KNEE ARTHROPLASTY  10/11/2010   left     Current Outpatient Medications on File Prior to Visit  Medication Sig Dispense Refill  . acetaminophen (TYLENOL) 325 MG tablet Take 650 mg by mouth every 6 (six) hours as needed.    . bacitracin ointment Apply topically 2 (two) times daily. To scalp laceration 120 g 0  . Cholecalciferol (VITAMIN D3) 5000 units CAPS Take 5,000 Units by mouth daily.    . ferrous sulfate 325 (65 FE) MG tablet Take 1 tablet (325 mg total) by mouth daily with breakfast.  3  . metFORMIN (GLUCOPHAGE) 500 MG tablet Take 500 mg by mouth daily.   1   No current facility-administered medications on file prior to visit.    Allergies  Allergen Reactions  . Lisinopril Other (See Comments) and Cough    Throat itching    Physical exam:  Today's Vitals   07/28/19 1518  BP: (!) 164/90  Pulse: 96  Temp: (!) 97.4 F (36.3 C)  Weight: 166 lb (75.3 kg)  Height: 5' 3.5" (1.613 m)   Body mass index is 28.94 kg/m.   Wt Readings from Last 3 Encounters:  07/28/19 166 lb (75.3 kg)  07/09/19 169 lb (76.7 kg)  06/23/19 171 lb (77.6 kg)     Ht Readings from Last 3 Encounters:  07/28/19 5' 3.5" (1.613 m)  06/23/19 5' 1.5" (1.562 m)  06/05/19 '5\' 4"'  (1.626 m)      General: The patient is awake, alert and appears not in acute distress. The patient is well groomed. Head: traumatic injuries to scalp , laceration and surgcial treatment with multiple stitches,  still a little bit swollen. . Neck is supple. neck circumference:14 inches . Nasal airflow congested  Retrognathia is  seen.  Dental status: N/a  Cardiovascular:  Regular rate and cardiac rhythm by pulse,  without distended neck veins. Respiratory: Lungs are clear to auscultation.  Skin:  Without evidence of ankle edema, or rash. Trunk: The patient's posture is erect.   Neurologic exam : The patient is awake and alert, oriented to place and time.   Memory subjective described as impaired   Attention span & concentration ability appears impaired.  Speech is o non-  fluent,  with dysarthria, dysphonia and stammering . aphasia.  Mood and affect are aloof.    Cranial nerves: loss of smell or taste reported - BOTH , and loss of appetite.  Pupils are equal t. Funduscopic exam deferred.   Extraocular movements in vertical and horizontal planes with Nystagmus to the left . She has not followed fully with her left eye- and there is still restriction of her left visual field,   Visual fields by finger perimetry are left sided impaired, partially due to swelling of  the eyelid, ptosis,sec to edema ?Marland Kitchen Hematoma.  Hearing was intact to soft voice and finger rubbing.    Facial sensation intact to fine touch.  Facial motor strength is symmetric and tongue and uvula move midline.  Neck ROM : rotation, tilt and flexion extension were normal for age and shoulder shrug was symmetrical.    Motor exam:  Symmetric bulk, tone and ROM.   Normal tone without cog wheeling, symmetric grip strength .   Sensory:  Fine touch, pinprick and vibration were tested  and  normal.  Proprioception tested in the upper extremities intact   Coordination: Rapid alternating movements in the fingers/hands were of normal speed.  The Finger-to-nose maneuver was intact without evidence of ataxia, dysmetria or tremor.   Gait and station: Patient could  rise unassisted from a seated position, she walked without cane !!!-  Still  shuffling gait, she makes larger steps and turned with 5 steps, she was  balanced.  ( She also almost ran into the left door frame , confirming her impaired peripheral vision.) Toe and heel walk were deferred.  Deep tendon reflexes: in the  upper and lower extremities are symmetrically attenuated, failure of relaxation. Babinski response was  Up-going on the left!        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  The MRI revealed a stroke in the left occiput and multifocal older strokes.  I referred to Cardiology to help with timely carotid dopplers, echocardiogram and BP control help.  I will refer to ophthalmology to get a true visual field test. tis recommendation was filed in first visit with the patient.    I agree with ST, PT , OT - all at the home as arranged.  Gait and balance training for fall prevention.   I would like to thank [name], MD for allowing me to meet with and to take care of this pleasant patient.     Larey Seat, MD  Board certified In Neurology through the ABPN, Fellow of the [employer] of Neurology. Medical Director of [employer].   Addendum - dr Einar Gip: planning to have loop recorder implanted , due to multiple strokes.  I will order a HST to rule out OSA.

## 2019-07-29 ENCOUNTER — Other Ambulatory Visit: Payer: Self-pay | Admitting: Neurology

## 2019-07-29 DIAGNOSIS — I69320 Aphasia following cerebral infarction: Secondary | ICD-10-CM

## 2019-07-29 DIAGNOSIS — H53482 Generalized contraction of visual field, left eye: Secondary | ICD-10-CM

## 2019-07-29 DIAGNOSIS — Z8673 Personal history of transient ischemic attack (TIA), and cerebral infarction without residual deficits: Secondary | ICD-10-CM

## 2019-08-04 ENCOUNTER — Other Ambulatory Visit: Payer: Self-pay | Admitting: Neurology

## 2019-08-04 DIAGNOSIS — H53482 Generalized contraction of visual field, left eye: Secondary | ICD-10-CM

## 2019-08-04 DIAGNOSIS — Z8673 Personal history of transient ischemic attack (TIA), and cerebral infarction without residual deficits: Secondary | ICD-10-CM

## 2019-08-04 DIAGNOSIS — R2689 Other abnormalities of gait and mobility: Secondary | ICD-10-CM

## 2019-08-08 ENCOUNTER — Ambulatory Visit (HOSPITAL_COMMUNITY)
Admission: RE | Admit: 2019-08-08 | Discharge: 2019-08-08 | Disposition: A | Discharge status: Home / Self Care | Payer: Medicare Other | Source: Ambulatory Visit | Attending: Neurology | Admitting: Neurology

## 2019-08-08 ENCOUNTER — Other Ambulatory Visit: Payer: Self-pay

## 2019-08-08 DIAGNOSIS — Z8673 Personal history of transient ischemic attack (TIA), and cerebral infarction without residual deficits: Secondary | ICD-10-CM | POA: Diagnosis present

## 2019-08-08 DIAGNOSIS — H53482 Generalized contraction of visual field, left eye: Secondary | ICD-10-CM

## 2019-08-08 DIAGNOSIS — R2689 Other abnormalities of gait and mobility: Secondary | ICD-10-CM

## 2019-08-08 NOTE — Progress Notes (Signed)
Good result- no significant carotid stenosis. Normal flow in vertebral arteries , too.

## 2019-08-11 ENCOUNTER — Encounter: Payer: Self-pay | Admitting: Neurology

## 2019-08-12 ENCOUNTER — Telehealth: Payer: Self-pay | Admitting: Neurology

## 2019-08-12 NOTE — Telephone Encounter (Signed)
-----   Message from Larey Seat, MD sent at 08/08/2019  1:54 PM EDT ----- Good result- no significant carotid stenosis. Normal flow in vertebral arteries , too.

## 2019-08-12 NOTE — Telephone Encounter (Signed)
Called the patient's daughter and advised of the normal doppler findings. She verbalized understanding and had no concerns.

## 2019-08-21 ENCOUNTER — Ambulatory Visit: Payer: Medicare Other | Admitting: Neurology

## 2019-09-17 NOTE — Progress Notes (Signed)
Primary Physician/Referring:  [name], MD  Patient ID: [name], female    DOB: 18-Aug-1945, 74 y.o.   MRN: [id number]  Chief Complaint  Patient presents with  . Hx of strokes  . Aphasia  . New Patient (Initial Visit)    Referred by Dr. Asencion Partridge Dohmeier   HPI:    [name]  is a 74 y.o. African-American female patient with history of multiple myeloma SP radiation therapy in 2006 and chemotherapy, history of breast lumpectomy SP radiation and chemo with radioactive seed implantation in 2017, she works as a Recruitment consultant for special needs and the hydraulic pump fell on her headl at the job site and had extensive scalp laceration on 06/05/2019, CT of the head had revealed no acute intracranial abnormality, chronic microvascular ischemic changes and cerebral volume loss.  Since then she has continued to have left-sided weakness, memory deficits, left hemianopsia and MRI on 07/21/2019 revealed much more extensive right brain involvement and also left occipital infarct which is more recent suggestive of multiple late and subacute infarct.  She is now referred to me for further evaluation.  She has recuperated significantly, speech still disturbed, left visual field defect still persist but overall feels well and is started to get around and walk without any assistance.  No chest pain or shortness of breath.  She is a non-smoker.  Past medical history significant for GERD, hyperlipidemia, hypertension and stage III chronic kidney disease.  Past Medical History:  Diagnosis Date  . Anemia   . Arthralgia    chronic, 2nd degree to stem cell transplantation  . Breast cancer ([place])   . Breast cancer, stage 1 ([place]) 09/09/2015  . Chronic pain of left knee   . Degenerative joint disease involving multiple joints 03/29/2011  . Diabetes mellitus without complication [facility])    Patient reports she hasnt had Diabetes in years  . DJD (degenerative joint disease)   . E. coli gastroenteritis   . GERD  (gastroesophageal reflux disease)   . Herpes simplex esophagitis 2006  . History of blood transfusion    with first pregnancy  . History of multiple myeloma    chemo XRT, Dr. Marin Olp  . History of radiation therapy 03/16/16-05/02/16   right breast 50.4 Gy in 28 fractions, boost to 60.4 Gy in 5 fractions  . 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 HTN (hypertension) 2005  . Hyperlipidemia   . Myeloma ([place]) 03/28/2011  . OA (osteoarthritis)   . Paget's bone disease   . Personal history of adenomatous colonic polyps 11/02/2011   2 diminutive serrated adenomas 10/2011 Repeat colonoscopy 2018 (change from original recall based upon current guidelines 12/07/2014)    . PONV (postoperative nausea and vomiting)   . Radiation 09/28/2006 - 10/11/2006   skull treated to 1980 cGy by Dr. Elba Barman  . Renal insufficiency    H/O resolved  . Scalp laceration 06/05/2019  . Vitamin D deficiency    Past Surgical History:  Procedure Laterality Date  . 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 BREAST LUMPECTOMY Right 11/2015   radiation and chemo  . BREAST LUMPECTOMY WITH RADIOACTIVE SEED AND SENTINEL LYMPH NODE BIOPSY Right 09/22/2015   Procedure: BREAST LUMPECTOMY WITH RADIOACTIVE SEED AND SENTINEL LYMPH NODE BIOPSY;  Surgeon: Excell Seltzer, MD;  Location: [place];  Service: General;  Laterality: Right;  . COLONOSCOPY    .  IR REMOVAL TUN ACCESS W/ PORT W/O FL MOD SED  05/23/2018  . JOINT REPLACEMENT Left   . KNEE ARTHROSCOPY  05/2008   left  . TOTAL KNEE ARTHROPLASTY  10/11/2010   left   Family History  Problem Relation Age of Onset  . Lung cancer Father        smoker  . Liver cancer Father   . Throat cancer Brother   . Esophageal cancer Brother   . Heart attack Mother   . Diabetes Mother   . Diabetes Sister   . Colon polyps Daughter   . Colon cancer Neg Hx   . Rectal  cancer Neg Hx   . Stomach cancer Neg Hx   . Breast cancer Neg Hx     Social History   Tobacco Use  . Smoking status: Never Smoker  . Smokeless tobacco: Never Used  . Tobacco comment: never used tobacco  Substance Use Topics  . Alcohol use: No    Alcohol/week: 0.0 standard drinks   Marital Status: Legally Separated  ROS  Review of Systems  Eyes: Positive for visual disturbance (left eye).  Cardiovascular: Negative for chest pain, dyspnea on exertion and leg swelling.  Gastrointestinal: Negative for melena.  Neurological: Positive for dizziness, focal weakness, headaches and loss of balance.   Objective  Blood pressure (!) 196/89, pulse 62, temperature (!) 97.3 F (36.3 C), temperature source Temporal, resp. rate 16, height 5' 3.5" (1.613 m), weight 161 lb (73 kg), last menstrual period 05/22/2000, SpO2 90 %.  Vitals with BMI 09/19/2019 07/28/2019 07/09/2019  Height 5' 3.5" 5' 3.5" -  Weight 161 lbs 166 lbs 169 lbs  BMI 28.07 39.76 73.41  Systolic 937 902 409  Diastolic 89 90 98  Pulse 62 96 109     Physical Exam  Constitutional: She is oriented to person, place, and time.  Cardiovascular: Normal rate, regular rhythm, normal heart sounds and intact distal pulses. Exam reveals no gallop.  No murmur heard. No leg edema, no JVD.  Pulmonary/Chest: Effort normal and breath sounds normal.  Abdominal: Soft. Bowel sounds are normal.  Neurological: She is oriented to person, place, and time.   Laboratory examination:   Recent Labs    06/05/19 0930 06/06/19 0403 07/09/19 0948  NA 141 140 142  K 4.0 4.5 3.5  CL 109 109 105  CO2 20* 20* 24  GLUCOSE 136* 136* 129*  BUN _0 CREATININE 1.54* 1.69* 1.74*  CALCIUM 9.3 8.9 10.4*  GFRNONAA 34* 30* 29*  GFRAA 39* 35* 34*   CrCl cannot be calculated (Patient's most recent lab result is older than the maximum 21 days allowed.).  CMP Latest Ref Rng & Units 07/09/2019 06/06/2019 06/05/2019  Glucose 70 - 99 mg/dL 129(H) 136(H)  136(H)  BUN 8 - 23 mg/dL _1 Creatinine 0.44 - 1.00 mg/dL 1.74(H) 1.69(H) 1.54(H)  Sodium 135 - 145 mmol/L 142 140 141  Potassium 3.5 - 5.1 mmol/L 3.5 4.5 4.0  Chloride 98 - 111 mmol/L 105 109 109  CO2 22 - 32 mmol/L 24 20(L) 20(L)  Calcium 8.9 - 10.3 mg/dL 10.4(H) 8.9 9.3  Total Protein 6.5 - 8.1 g/dL 8.0 - -  Total Bilirubin 0.3 - 1.2 mg/dL 0.3 - -  Alkaline Phos 38 - 126 U/L 64 - -  AST 15 - 41 U/L 14(L) - -  ALT 0 - 44 U/L 11 - -   CBC Latest Ref Rng & Units 07/09/2019 06/06/2019 06/06/2019  WBC  4.0 - 10.5 K/uL 4.4 8.1 8.4  Hemoglobin 12.0 - 15.0 g/dL 11.5(L) 7.4(L) 7.7(L)  Hematocrit 36.0 - 46.0 % 36.7 22.5(L) 23.9(L)  Platelets 150 - 400 K/uL 347 244 242   External labs:   Cholesterol, total 242.000 m 09/01/2019 HDL 26 MG/DL 09/01/2019 LDL 187.000 m 09/01/2019 Triglycerides 145.000 09/01/2019  A1C 5.500 % 09/01/2019 TSH 1.430 09/01/2019  Hemoglobin 11.200 g/ 09/01/2019 INR 1.100 06/05/2019 Platelets 347.000 07/09/2019  Creatinine, Serum 1.600 mg/ 09/01/2019 Potassium 3.500 07/09/2019 ALT (SGPT) 17.000 uni 09/01/2019  Medications and allergies   Allergies  Allergen Reactions  . Lisinopril Other (See Comments) and Cough    Throat itching     Current Outpatient Medications  Medication Instructions  . acetaminophen (TYLENOL) 650 mg, Oral, Every 6 hours PRN  . amLODipine (NORVASC) 10 mg, Oral, Every evening  . aspirin EC 81 mg, Oral, Daily  . atenolol (TENORMIN) 100 mg, Oral, Daily  . bacitracin ointment Topical, 2 times daily, To scalp laceration  . dexlansoprazole (DEXILANT) 60 mg, Oral, Daily  . doxylamine (Sleep) (UNISOM) 25 mg, Oral, At bedtime PRN  . ferrous sulfate 325 mg, Oral, Daily with breakfast  . megestrol (MEGACE) 40 mg, Oral, Daily  . metFORMIN (GLUCOPHAGE) 500 mg, Oral, Daily  . rosuvastatin (CRESTOR) 10 mg, Oral, Daily  . Vitamin D3 5,000 Units, Oral, Daily   Radiology:   No results found.  Cardiac Studies:   Nuclear Medicine Cardiac [place]  Scan 09/09/04 AT 1610 HOURS:  Findings: 25 millicuries of technetium 27mpertechneate. Subsequently multigated acquisition images of the heart were obtained. The calculated ejection fractions from two technologists are 67 percent and 63 percent. The overall average ejection fraction is therefore 65 percent.  IMPRESSION:   Average calculated ejection fraction is 65 percent.  Echocardiogram 12/14/2015: Left ventricle: The cavity size was normal. Posterior wall thickness was increased in a pattern of mild VH. Systolic function was normal. The estimated ejection fraction was in the range of 55% to 60%. Wall motion was normal; there were no regional wall motion abnormalities. Doppler parameters are consistent with abnormal left ventricular relaxation (grade 1 diastolic dysfunction). Doppler parameters are consistent with indeterminate ventricular filling pressure.  Aortic valve: Transvalvular velocity was within the normal range. There was no stenosis. There was no regurgitation.  Mitral valve: Transvalvular velocity was within the normal range. There was no evidence for stenosis. There was no regurgitation.  Right ventricle: The cavity size was normal. Wall thickness was normal. Systolic function was normal.  Atrial septum: No defect or patent foramen ovale was identified by color flow Doppler.  Tricuspid valve: There was trivial regurgitation.  Pulmonary arteries: Systolic pressure was within the normal range. PA peak pressure: 22 mm Hg (S).  Carotid Arterial Duplex Study 08/08/2019:  Right Carotid: Velocities in the right ICA are consistent with a 1-39% stenosis.  Left Carotid: Velocities in the left ICA are consistent with a 1-39% stenosis.  Vertebrals:  Bilateral vertebral arteries demonstrate antegrade flow.  Subclavians: Normal flow hemodynamics were seen in bilateral subclavian arteries.  EKG  EKG 09/19/2019: Normal sinus rhythm at rate of 78 bpm, leftward axis, poor R wave progression,  probably normal variant.  LVH.    Assessment     ICD-10-CM   1. Cerebrovascular accident (CVA) due to embolism of precerebral artery (HCC)  I63.10 EKG 12-Lead    LONG TERM MONITOR-LIVE TELEMETRY (3-14 DAYS)  2. Hypercholesteremia  E78.00 rosuvastatin (CRESTOR) 20 MG tablet  3. Primary hypertension  I10 amLODipine (NORVASC) 10 MG tablet  atenolol (TENORMIN) 100 MG tablet  4. Type 2 diabetes mellitus with stage 3b chronic kidney disease, without long-term current use of insulin (HCC)  E11.21    N18.32      Meds ordered this encounter  Medications  . amLODipine (NORVASC) 10 MG tablet    Sig: Take 1 tablet (10 mg total) by mouth every evening.    Dispense:  30 tablet    Refill:  2  . atenolol (TENORMIN) 100 MG tablet    Sig: Take 1 tablet (100 mg total) by mouth daily.    Dispense:  30 tablet    Refill:  2  . rosuvastatin (CRESTOR) 20 MG tablet    Sig: Take 0.5 tablets (10 mg total) by mouth daily.    Dispense:  15 tablet    Refill:  0    There are no discontinued medications.  Recommendations:   [name]  is a 73 y.o. 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  Past medical history significant for GERD, hyperlipidemia, hypertension and stage III chronic kidney disease.  Her blood pressure has been uncontrolled, I will add atenolol 100 p.o. every morning and amlodipine 10 mg daily p.m. and started on Crestor 20 mg daily.  Continue aspirin for now.  With regard to evaluation of stroke, she needs TEE and also remote outpatient cardiac telemetry for 10 days to  exclude paroxysmal atrial fibrillation.  If these studies are unyielding, she will need loop recorder implantation.  Daughter present at the bedside and all questions answered.  I have reviewed her labs, CT scans and MRI and also prior office visit notes and updated them. This was a 60-minute encounter.  Adrian Prows, MD, [facility] 09/19/2019, 11:06 AM [facility]. [place] Office: [phone]

## 2019-09-19 ENCOUNTER — Other Ambulatory Visit: Payer: Self-pay

## 2019-09-19 ENCOUNTER — Encounter: Payer: Self-pay | Admitting: Cardiology

## 2019-09-19 ENCOUNTER — Ambulatory Visit: Payer: Medicare Other

## 2019-09-19 ENCOUNTER — Ambulatory Visit: Payer: Medicare Other | Admitting: Cardiology

## 2019-09-19 VITALS — BP 196/89 | HR 62 | Temp 97.3°F | Resp 16 | Ht 63.5 in | Wt 161.0 lb

## 2019-09-19 DIAGNOSIS — E7800 Pure hypercholesterolemia, unspecified: Secondary | ICD-10-CM

## 2019-09-19 DIAGNOSIS — E1122 Type 2 diabetes mellitus with diabetic chronic kidney disease: Secondary | ICD-10-CM

## 2019-09-19 DIAGNOSIS — I10 Essential (primary) hypertension: Secondary | ICD-10-CM

## 2019-09-19 DIAGNOSIS — I6310 Cerebral infarction due to embolism of unspecified precerebral artery: Secondary | ICD-10-CM

## 2019-09-19 MED ORDER — ATENOLOL 100 MG PO TABS: 100.0000 mg | ORAL_TABLET | Freq: Every day | ORAL | 2 refills | Status: DC

## 2019-09-19 MED ORDER — ROSUVASTATIN CALCIUM 20 MG PO TABS: 10.0000 mg | ORAL_TABLET | Freq: Every day | ORAL | 0 refills | Status: DC

## 2019-09-19 MED ORDER — AMLODIPINE BESYLATE 10 MG PO TABS: 10.0000 mg | ORAL_TABLET | Freq: Every evening | ORAL | 2 refills | Status: DC

## 2019-10-03 ENCOUNTER — Other Ambulatory Visit: Payer: Self-pay | Admitting: Cardiology

## 2019-10-03 ENCOUNTER — Other Ambulatory Visit (HOSPITAL_COMMUNITY)
Admission: RE | Admit: 2019-10-03 | Discharge: 2019-10-03 | Disposition: A | Discharge status: Home / Self Care | Payer: Medicare Other | Source: Ambulatory Visit | Attending: Cardiology | Admitting: Cardiology

## 2019-10-03 DIAGNOSIS — E7800 Pure hypercholesterolemia, unspecified: Secondary | ICD-10-CM

## 2019-10-03 DIAGNOSIS — Z01812 Encounter for preprocedural laboratory examination: Secondary | ICD-10-CM | POA: Diagnosis present

## 2019-10-03 DIAGNOSIS — I10 Essential (primary) hypertension: Secondary | ICD-10-CM

## 2019-10-03 DIAGNOSIS — Z20822 Contact with and (suspected) exposure to covid-19: Secondary | ICD-10-CM | POA: Insufficient documentation

## 2019-10-03 LAB — SARS CORONAVIRUS 2 (TAT 6-24 HRS): SARS Coronavirus 2: NEGATIVE

## 2019-10-04 LAB — BASIC METABOLIC PANEL
BUN/Creatinine Ratio: 14 (ref 12–28)
BUN: 27 mg/dL (ref 8–27)
CO2: 17 mmol/L — ABNORMAL LOW (ref 20–29)
Calcium: 10.5 mg/dL — ABNORMAL HIGH (ref 8.7–10.3)
Chloride: 111 mmol/L — ABNORMAL HIGH (ref 96–106)
Creatinine, Ser: 1.88 mg/dL — ABNORMAL HIGH (ref 0.57–1.00)
GFR calc Af Amer: 31 mL/min/{1.73_m2} — ABNORMAL LOW (ref >59)
GFR calc non Af Amer: 26 mL/min/{1.73_m2} — ABNORMAL LOW (ref >59)
Glucose: 89 mg/dL (ref 65–99)
Potassium: 4.8 mmol/L (ref 3.5–5.2)
Sodium: 145 mmol/L — ABNORMAL HIGH (ref 134–144)

## 2019-10-04 NOTE — Progress Notes (Signed)
Renal function is stable, sodium level slightly high, labs are stable for TEE.

## 2019-10-04 NOTE — H&P (View-Only) (Signed)
Renal function is stable, sodium level slightly high, labs are stable for TEE.

## 2019-10-06 ENCOUNTER — Inpatient Hospital Stay: Payer: Medicare Other

## 2019-10-06 ENCOUNTER — Inpatient Hospital Stay: Payer: Medicare Other | Attending: Hematology & Oncology | Admitting: Family

## 2019-10-07 ENCOUNTER — Ambulatory Visit (HOSPITAL_COMMUNITY)
Admission: RE | Admit: 2019-10-07 | Discharge: 2019-10-07 | Disposition: A | Discharge status: Home / Self Care | Payer: Medicare Other | Source: Home / Self Care | Attending: Cardiology | Admitting: Cardiology

## 2019-10-07 ENCOUNTER — Other Ambulatory Visit: Payer: Self-pay

## 2019-10-07 ENCOUNTER — Encounter (HOSPITAL_COMMUNITY)
Admission: RE | Disposition: A | Discharge status: Home / Self Care | Payer: Self-pay | Source: Home / Self Care | Attending: Cardiology

## 2019-10-07 ENCOUNTER — Ambulatory Visit (HOSPITAL_COMMUNITY)
Admission: RE | Admit: 2019-10-07 | Discharge: 2019-10-07 | Disposition: A | Discharge status: Home / Self Care | Payer: Medicare Other | Attending: Cardiology | Admitting: Cardiology

## 2019-10-07 ENCOUNTER — Encounter (HOSPITAL_COMMUNITY): Payer: Self-pay | Admitting: Cardiology

## 2019-10-07 DIAGNOSIS — E785 Hyperlipidemia, unspecified: Secondary | ICD-10-CM | POA: Diagnosis not present

## 2019-10-07 DIAGNOSIS — Z79899 Other long term (current) drug therapy: Secondary | ICD-10-CM | POA: Diagnosis not present

## 2019-10-07 DIAGNOSIS — Z8619 Personal history of other infectious and parasitic diseases: Secondary | ICD-10-CM | POA: Insufficient documentation

## 2019-10-07 DIAGNOSIS — I129 Hypertensive chronic kidney disease with stage 1 through stage 4 chronic kidney disease, or unspecified chronic kidney disease: Secondary | ICD-10-CM | POA: Insufficient documentation

## 2019-10-07 DIAGNOSIS — N1832 Chronic kidney disease, stage 3b: Secondary | ICD-10-CM | POA: Diagnosis not present

## 2019-10-07 DIAGNOSIS — Z96652 Presence of left artificial knee joint: Secondary | ICD-10-CM | POA: Insufficient documentation

## 2019-10-07 DIAGNOSIS — G459 Transient cerebral ischemic attack, unspecified: Secondary | ICD-10-CM | POA: Diagnosis not present

## 2019-10-07 DIAGNOSIS — Z8249 Family history of ischemic heart disease and other diseases of the circulatory system: Secondary | ICD-10-CM | POA: Insufficient documentation

## 2019-10-07 DIAGNOSIS — Z7982 Long term (current) use of aspirin: Secondary | ICD-10-CM | POA: Diagnosis not present

## 2019-10-07 DIAGNOSIS — K219 Gastro-esophageal reflux disease without esophagitis: Secondary | ICD-10-CM | POA: Insufficient documentation

## 2019-10-07 DIAGNOSIS — M1990 Unspecified osteoarthritis, unspecified site: Secondary | ICD-10-CM | POA: Diagnosis not present

## 2019-10-07 DIAGNOSIS — Z946 Bone transplant status: Secondary | ICD-10-CM | POA: Insufficient documentation

## 2019-10-07 DIAGNOSIS — Z888 Allergy status to other drugs, medicaments and biological substances status: Secondary | ICD-10-CM | POA: Diagnosis not present

## 2019-10-07 DIAGNOSIS — Z7984 Long term (current) use of oral hypoglycemic drugs: Secondary | ICD-10-CM | POA: Diagnosis not present

## 2019-10-07 HISTORY — PX: BUBBLE STUDY: SHX6837

## 2019-10-07 HISTORY — PX: TEE WITHOUT CARDIOVERSION: SHX5443

## 2019-10-07 SURGERY — ECHOCARDIOGRAM, TRANSESOPHAGEAL
Anesthesia: Moderate Sedation

## 2019-10-07 MED ORDER — MIDAZOLAM HCL (PF) 10 MG/2ML IJ SOLN
INTRAMUSCULAR | Status: DC | PRN
  Administered 2019-10-07 (×2): 2 mg via INTRAVENOUS
  Administered 2019-10-07: 1 mg via INTRAVENOUS

## 2019-10-07 MED ORDER — FENTANYL CITRATE (PF) 100 MCG/2ML IJ SOLN
INTRAMUSCULAR | Status: AC
  Filled 2019-10-07: qty 2

## 2019-10-07 MED ORDER — BUTAMBEN-TETRACAINE-BENZOCAINE 2-2-14 % EX AERO
INHALATION_SPRAY | CUTANEOUS | Status: DC | PRN
  Administered 2019-10-07: 2 via TOPICAL

## 2019-10-07 MED ORDER — FENTANYL CITRATE (PF) 100 MCG/2ML IJ SOLN
INTRAMUSCULAR | Status: DC | PRN
  Administered 2019-10-07 (×2): 25 ug via INTRAVENOUS

## 2019-10-07 MED ORDER — DIPHENHYDRAMINE HCL 50 MG/ML IJ SOLN
INTRAMUSCULAR | Status: AC
  Filled 2019-10-07: qty 1

## 2019-10-07 MED ORDER — SODIUM CHLORIDE BACTERIOSTATIC 0.9 % IJ SOLN
INTRAMUSCULAR | Status: DC | PRN
  Administered 2019-10-07 (×2): 9 mL via INTRAVENOUS

## 2019-10-07 MED ORDER — SODIUM CHLORIDE 0.9 % IV SOLN
INTRAVENOUS | Status: DC
  Administered 2019-10-07: 500 mL via INTRAVENOUS

## 2019-10-07 MED ORDER — MIDAZOLAM HCL (PF) 5 MG/ML IJ SOLN
INTRAMUSCULAR | Status: AC
  Filled 2019-10-07: qty 2

## 2019-10-07 NOTE — Progress Notes (Signed)
  Echocardiogram 2D Echocardiogram has been performed.  [name] 10/07/2019, 10:57 AM

## 2019-10-07 NOTE — Discharge Instructions (Signed)

## 2019-10-07 NOTE — Interval H&P Note (Signed)
History and Physical Interval Note:  10/07/2019 10:30 AM  [facility]  has presented today for surgery, with the diagnosis of [place].  The various methods of treatment have been discussed with the patient and family. After consideration of risks, benefits and other options for treatment, the patient has consented to  Procedure(s): TRANSESOPHAGEAL ECHOCARDIOGRAM (TEE) (N/A) BUBBLE STUDY as a surgical intervention.  The patient's history has been reviewed, patient examined, no change in status, stable for surgery.  I have reviewed the patient's chart and labs.  Questions were answered to the patient's satisfaction.     Adrian Prows

## 2019-10-07 NOTE — CV Procedure (Signed)
Indication for TEE: CVA TEE: Under moderate sedation, TEE was performed without complications: LV: Normal. Normal EF. RV: Normal LA: Normal. Left atrial appendage: Normal without thrombus. Normal function. Inter atrial septum is intact without defect. Double contrast study weakly positive for late appearance of bubbles.  RA: Normal. Eustachian valve noted.   MV: Normal Trace MR. TV: Normal Trace TR AV: Normal. No AI or AS. PV: Normal. Trace PI.  Thoracic and ascending aorta: Normal without significant plaque or atheromatous changes.  Conscious sedation protocol was followed, I personally administered conscious sedation and monitored the patient. Patient received 5 milligrams of Versed and 69mcg fentanyl . Patient tolerated the procedure well and there was no complication from conscious sedation. Time administered was 11 minutes.   Adrian Prows, MD, [facility] 10/07/2019, 10:45 AM [facility]. [place] Office: [phone]

## 2019-10-21 NOTE — Progress Notes (Signed)
AT/SVT cannot be excluded. Will discuss soon

## 2019-10-23 NOTE — Progress Notes (Signed)
Primary Physician/Referring:  [name], MD  Patient ID: [name], female    DOB: 07-Nov-1945, 74 y.o.   MRN: [id number]  Chief Complaint  Patient presents with  . Cerebrovascular Accident    4 week  . Hypertension   HPI:    [name]  is a 74 y.o.  African-American female patient with history of multiple myeloma SP radiation therapy in 2006 and chemotherapy, history of breast lumpectomy SP radiation and chemo with radioactive seed implantation in 2017, GERD, hyperlipidemia, hypertension, stage III chronic kidney disease and was previously working as a Recruitment consultant for special needs and the hydraulic pump fell on her head at the job site and had extensive scalp laceration on 06/05/2019, CT of the head had revealed no acute intracranial abnormality, chronic microvascular ischemic changes and cerebral volume loss.  Since then she has continued to have left-sided weakness, memory deficits, left hemianopsia and MRI on 07/21/2019 revealed much more extensive right brain involvement and also left occipital infarct which is more recent suggestive of multiple late and subacute infarcts.  She underwent event monitoring on 09/09/2019 revealing a brief either atrial fibrillation or atrial tachycardia and TEE on 10/02/2019 which did not reveal cardiac source of cerebral emboli.  She now presents for follow-up.  Since last time as she has increased her physical activity she has noticed exertional chest pain and sometimes feels like it radiates to her back.  Last for 5 to 10 minutes and relieves after she rests for a while.  Her daughters have been concerned about her new onset of chest pain.  Past medical history significant for GERD, hyperlipidemia, hypertension and stage III chronic kidney disease.  Past Medical History:  Diagnosis Date  . Anemia   . Arthralgia    chronic, 2nd degree to stem cell transplantation  . Breast cancer ([place])   . Breast cancer, stage 1 ([place]) 09/09/2015  . Chronic pain of  left knee   . Degenerative joint disease involving multiple joints 03/29/2011  . Diabetes mellitus without complication [facility])    Patient reports she hasnt had Diabetes in years  . DJD (degenerative joint disease)   . E. coli gastroenteritis   . GERD (gastroesophageal reflux disease)   . Herpes simplex esophagitis 2006  . History of blood transfusion    with first pregnancy  . History of multiple myeloma    chemo XRT, Dr. Marin Olp  . History of radiation therapy 03/16/16-05/02/16   right breast 50.4 Gy in 28 fractions, boost to 60.4 Gy in 5 fractions  . 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 HTN (hypertension) 2005  . Hyperlipidemia   . Myeloma ([place]) 03/28/2011  . OA (osteoarthritis)   . Paget's bone disease   . Personal history of adenomatous colonic polyps 11/02/2011   2 diminutive serrated adenomas 10/2011 Repeat colonoscopy 2018 (change from original recall based upon current guidelines 12/07/2014)    . PONV (postoperative nausea and vomiting)   . Radiation 09/28/2006 - 10/11/2006   skull treated to 1980 cGy by Dr. Elba Barman  . Renal insufficiency    H/O resolved  . Scalp laceration 06/05/2019  . Stroke ([place])   . Vitamin D deficiency    Past Surgical History:  Procedure Laterality Date  . 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 BREAST LUMPECTOMY Right 11/2015   radiation and chemo  . BREAST LUMPECTOMY WITH  RADIOACTIVE SEED AND SENTINEL LYMPH NODE BIOPSY Right 09/22/2015   Procedure: BREAST LUMPECTOMY WITH RADIOACTIVE SEED AND SENTINEL LYMPH NODE BIOPSY;  Surgeon: Excell Seltzer, MD;  Location: [facility];  Service: General;  Laterality: Right;  . BUBBLE STUDY  10/07/2019   Procedure: BUBBLE STUDY;  Surgeon: Adrian Prows, MD;  Location: [place];  Service: Cardiovascular;;  . COLONOSCOPY    . IR REMOVAL TUN ACCESS W/ PORT W/O FL MOD SED  05/23/2018  .  JOINT REPLACEMENT Left   . KNEE ARTHROSCOPY  05/2008   left  . TEE WITHOUT CARDIOVERSION N/A 10/07/2019   Procedure: TRANSESOPHAGEAL ECHOCARDIOGRAM (TEE);  Surgeon: Adrian Prows, MD;  Location: [facility];  Service: Cardiovascular;  Laterality: N/A;  . TOTAL KNEE ARTHROPLASTY  10/11/2010   left   Family History  Problem Relation Age of Onset  . Lung cancer Father        smoker  . Liver cancer Father   . Throat cancer Brother   . Esophageal cancer Brother   . Heart attack Mother   . Diabetes Mother   . Diabetes Sister   . Colon polyps Daughter   . Colon cancer Neg Hx   . Rectal cancer Neg Hx   . Stomach cancer Neg Hx   . Breast cancer Neg Hx     Social History   Tobacco Use  . Smoking status: Never Smoker  . Smokeless tobacco: Never Used  . Tobacco comment: never used tobacco  Substance Use Topics  . Alcohol use: No    Alcohol/week: 0.0 standard drinks   Marital Status: Legally Separated  ROS  Review of Systems  Eyes: Positive for visual disturbance (left eye).  Cardiovascular: Negative for chest pain, dyspnea on exertion and leg swelling.  Gastrointestinal: Negative for melena.  Neurological: Positive for dizziness, focal weakness, headaches and loss of balance.   Objective  Blood pressure (!) 148/78, pulse 88, resp. rate 16, height 5' 3" (1.6 m), weight 160 lb (72.6 kg), last menstrual period 05/22/2000, SpO2 96 %.  Vitals with BMI 10/24/2019 10/07/2019 10/07/2019  Height 5' 3" - -  Weight 160 lbs - -  BMI 51.76 - -  Systolic 160 737 106  Diastolic 78 76 65  Pulse 88 67 58     Physical Exam  Constitutional: She is oriented to person, place, and time.  Cardiovascular: Normal rate, regular rhythm, normal heart sounds and intact distal pulses. Exam reveals no gallop.  No murmur heard. No leg edema, no JVD.  Pulmonary/Chest: Effort normal and breath sounds normal.  Abdominal: Soft. Bowel sounds are normal.  Neurological: She is oriented to person, place, and time.    Laboratory examination:   Recent Labs    06/06/19 0403 07/09/19 0948 10/03/19 1423  NA 140 142 145*  K 4.5 3.5 4.8  CL 109 105 111*  CO2 20* 24 17*  GLUCOSE 136* 129* 89  BUN _0 CREATININE 1.69* 1.74* 1.88*  CALCIUM 8.9 10.4* 10.5*  GFRNONAA 30* 29* 26*  GFRAA 35* 34* 31*   CrCl cannot be calculated (Patient's most recent lab result is older than the maximum 21 days allowed.).  CMP Latest Ref Rng & Units 10/03/2019 07/09/2019 06/06/2019  Glucose 65 - 99 mg/dL 89 129(H) 136(H)  BUN 8 - 27 mg/dL _1 Creatinine 0.57 - 1.00 mg/dL 1.88(H) 1.74(H) 1.69(H)  Sodium 134 - 144 mmol/L 145(H) 142 140  Potassium 3.5 - 5.2 mmol/L 4.8 3.5 4.5  Chloride 96 -  106 mmol/L 111(H) 105 109  CO2 20 - 29 mmol/L 17(L) 24 20(L)  Calcium 8.7 - 10.3 mg/dL 10.5(H) 10.4(H) 8.9  Total Protein 6.5 - 8.1 g/dL - 8.0 -  Total Bilirubin 0.3 - 1.2 mg/dL - 0.3 -  Alkaline Phos 38 - 126 U/L - 64 -  AST 15 - 41 U/L - 14(L) -  ALT 0 - 44 U/L - 11 -   CBC Latest Ref Rng & Units 07/09/2019 06/06/2019 06/06/2019  WBC 4.0 - 10.5 K/uL 4.4 8.1 8.4  Hemoglobin 12.0 - 15.0 g/dL 11.5(L) 7.4(L) 7.7(L)  Hematocrit 36.0 - 46.0 % 36.7 22.5(L) 23.9(L)  Platelets 150 - 400 K/uL 347 244 242    External labs:  Cholesterol, total 242.000 m 09/01/2019 HDL 26 MG/DL 09/01/2019 LDL 187.000 m 09/01/2019 Triglycerides 145.000 09/01/2019  A1C 5.500 % 09/01/2019 TSH 1.430 09/01/2019  Hemoglobin 11.200 g/ 09/01/2019 INR 1.100 06/05/2019 Platelets 347.000 07/09/2019  Creatinine, Serum 1.600 mg/ 09/01/2019 Potassium 3.500 07/09/2019 ALT (SGPT) 17.000 uni 09/01/2019  Medications and allergies   Allergies  Allergen Reactions  . Lisinopril Other (See Comments) and Cough    Throat itching     Current Outpatient Medications  Medication Instructions  . acetaminophen (TYLENOL) 650 mg, Oral, Every 6 hours PRN  . amLODipine (NORVASC) 10 mg, Oral, Every evening  . aspirin EC 81 mg, Oral, Daily  . atenolol (TENORMIN) 100 mg,  Oral, Daily  . dexlansoprazole (DEXILANT) 60 mg, Oral, Daily  . doxylamine (Sleep) (UNISOM) 25 mg, Oral, At bedtime PRN  . ferrous sulfate 325 mg, Oral, Daily with breakfast  . hydrALAZINE (APRESOLINE) 25 mg, Oral, 3 times daily  . megestrol (MEGACE) 40 mg, Oral, Daily  . metFORMIN (GLUCOPHAGE) 500 mg, Oral, Daily  . rosuvastatin (CRESTOR) 20 MG tablet TAKE 1/2 TABLET(10 MG) BY MOUTH DAILY  . Vitamin D3 5,000 Units, Oral, Daily   There are no discontinued medications.  Radiology:   MRI Brain 07/17/2019: Abnormal MRI scan of the brain showing multiple late subacute to chronic infarcts involving right greater than left subcortical white matter as well as a more recent left occipital infarct.  There is age advanced generalized cerebral atrophy and age-appropriate changes of chronic small vessel disease. No enhancing lesions are noted.  Cardiac Studies:   Nuclear Medicine Cardiac [place] Scan 09/09/04 AT 0762 HOURS:  Findings: 25 millicuries of technetium 71mpertechneate. Subsequently multigated acquisition images of the heart were obtained. The calculated ejection fractions from two technologists are 67 percent and 63 percent. The overall average ejection fraction is therefore 65 percent.  IMPRESSION:   Average calculated ejection fraction is 65 percent.  Carotid Arterial Duplex Study 08/08/2019:  Right Carotid: Velocities in the right ICA are consistent with a 1-39% stenosis.  Left Carotid: Velocities in the left ICA are consistent with a 1-39% stenosis.  Vertebrals:  Bilateral vertebral arteries demonstrate antegrade flow.  Subclavians: Normal flow hemodynamics were seen in bilateral subclavian arteries.  Event Monitor for 14 days Start date 09/19/2019:  Predominant rhythm is normal sinus rhythm.  Minimum heart rate 53 bpm at 6:26 PM and maximum heart rate 150 bpm at 12:02 PM on 09/22/2019.  Sinus tachycardia occurred between 14 to 30 %/day with an average rate of 119 bpm.  I cannot exclude  at least one episode of atrial tachycardia with a heart rate of 145 bpm.  No patient activated events.  Echo TEE 10/07/2019: 1. Left ventricular ejection fraction, by estimation, is 60 to 65%. The left ventricle has normal function. The  left ventricle has no regional wall motion abnormalities.  2. Right ventricular systolic function is normal. The right ventricular size is normal.  3. No left atrial/left atrial appendage thrombus was detected.  4. There was one episode of late appearence of bubbles in the LA, weakly positive for probable pulmonary shunt.  5. The mitral valve is normal in structure. Trivial mitral valve regurgitation.  6. The aortic valve is normal in structure. Aortic valve regurgitation is not visualized. No aortic stenosis is present.   Conclusion(s)/Recommendation(s): No LA/LAA thrombus identified. Negative bubble study for interatrial shunt. No intracardiac source of embolism  detected on this on this transesophageal echocardiogram.   EKG  09/19/2019: Normal sinus rhythm at rate of 78 bpm, leftward axis, poor R wave progression, probably normal variant.  LVH.    Assessment     ICD-10-CM   1. Cerebrovascular accident (CVA) due to embolism of precerebral artery ([place])  I63.10 Lipid Panel With LDL/HDL Ratio    Lipoprotein A (LPA)  2. Primary hypertension  I10 hydrALAZINE (APRESOLINE) 25 MG tablet  3. Hypercholesteremia  E78.00 Lipid Panel With LDL/HDL Ratio    Lipoprotein A (LPA)  4. Exertional chest pain  R07.9 PCV MYOCARDIAL PERFUSION WITH LEXISCAN  5. 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  Since then she has continued to have left-sided weakness, memory deficits, left hemianopsia and MRI on 07/21/2019 revealed much more extensive right brain involvement and also left occipital infarct which is more recent suggestive of multiple late and subacute infarcts.  She is tolerating amlodipine and also high intensity statin without any side effects, she will need lipid profile testing.  Her recent onset of chest pain associated with dyspnea on exertion which comes on during her exertional activity is very much indicating of angina pectoris, I will set her up for stress test. Schedule for a Lexiscan Sestamibi stress test to evaluate for myocardial ischemia. Patient unable to do treadmill stress testing due to neurologic gait instability.  Since addition of atenolol and amlodipine, blood pressure is much improved but still not at goal, I will add hydralazine 25 mg p.o. 3 times daily.  In view of embolic CVA, event monitor revealing brief atrial tachycardia versus atrial fibrillation, recent TEE revealing no source for cardiac embolism, carotid duplex revealing no significant extracranial abnormality, I recommended that we proceed with loop recorder implantation.  I have discussed the risks and benefits of loop recorder implantation including need for long-term monitoring, bleeding at the site of loop implantation, infection but not limited to this, overall low risk.  Patient's daughters are present and they would like to proceed with loop implantation.  I will also keep Dr. Roddie Mc informed about my evaluation.   Happy birthday!  Adrian Prows, MD, [facility] 10/25/2019, 5:03 PM [facility] [place] Pager: [phone] Office:  [phone]

## 2019-10-24 ENCOUNTER — Ambulatory Visit: Payer: Medicare Other | Admitting: Cardiology

## 2019-10-24 ENCOUNTER — Encounter: Payer: Self-pay | Admitting: Cardiology

## 2019-10-24 ENCOUNTER — Other Ambulatory Visit: Payer: Self-pay

## 2019-10-24 VITALS — BP 148/78 | HR 88 | Resp 16 | Ht 63.0 in | Wt 160.0 lb

## 2019-10-24 DIAGNOSIS — I10 Essential (primary) hypertension: Secondary | ICD-10-CM

## 2019-10-24 DIAGNOSIS — E7800 Pure hypercholesterolemia, unspecified: Secondary | ICD-10-CM

## 2019-10-24 DIAGNOSIS — I6310 Cerebral infarction due to embolism of unspecified precerebral artery: Secondary | ICD-10-CM

## 2019-10-24 DIAGNOSIS — R0609 Other forms of dyspnea: Secondary | ICD-10-CM

## 2019-10-24 DIAGNOSIS — R079 Chest pain, unspecified: Secondary | ICD-10-CM

## 2019-10-24 MED ORDER — HYDRALAZINE HCL 25 MG PO TABS: 25.0000 mg | ORAL_TABLET | Freq: Three times a day (TID) | ORAL | 2 refills | Status: DC

## 2019-11-05 ENCOUNTER — Telehealth: Payer: Self-pay | Admitting: Neurology

## 2019-11-05 NOTE — Addendum Note (Signed)
Addended by: Larey Seat on: 11/05/2019 05:47 PM   Modules accepted: Orders

## 2019-11-05 NOTE — Telephone Encounter (Signed)
-----   Message from Adrian Prows, MD sent at 10/25/2019  5:00 PM EDT ----- Regarding: Loop implantation Please check my detailed OV notes. I am concerned about multiple strokes and appears embolic. TEE, Carotid unrevealing and Event ? Atrial tachy vs A. Fib. I plan to implant a loop recorder for long term monitoring for atrial fibrillation. Wanted to see if it is appropriate.  JG

## 2019-11-07 LAB — LIPOPROTEIN A (LPA): Lipoprotein (a): 169.6 nmol/L — ABNORMAL HIGH (ref <75.0)

## 2019-11-07 LAB — LIPID PANEL WITH LDL/HDL RATIO
Cholesterol, Total: 177 mg/dL (ref 100–199)
HDL: 34 mg/dL — ABNORMAL LOW (ref >39)
LDL Chol Calc (NIH): 122 mg/dL — ABNORMAL HIGH (ref 0–99)
LDL/HDL Ratio: 3.6 ratio — ABNORMAL HIGH (ref 0.0–3.2)
Triglycerides: 117 mg/dL (ref 0–149)
VLDL Cholesterol Cal: 21 mg/dL (ref 5–40)

## 2019-11-10 ENCOUNTER — Other Ambulatory Visit: Payer: Self-pay

## 2019-11-10 ENCOUNTER — Ambulatory Visit: Payer: Medicare Other

## 2019-11-10 DIAGNOSIS — R079 Chest pain, unspecified: Secondary | ICD-10-CM

## 2019-11-10 DIAGNOSIS — R0609 Other forms of dyspnea: Secondary | ICD-10-CM

## 2019-11-11 NOTE — Progress Notes (Signed)
Normal stress test

## 2019-11-19 ENCOUNTER — Telehealth: Payer: Self-pay

## 2019-11-19 NOTE — Telephone Encounter (Signed)
LVM for pt to call me back to schedule sleep study  

## 2019-11-24 ENCOUNTER — Encounter: Payer: Self-pay | Admitting: Neurology

## 2019-12-03 ENCOUNTER — Ambulatory Visit (INDEPENDENT_AMBULATORY_CARE_PROVIDER_SITE_OTHER): Payer: Medicare Other | Admitting: Neurology

## 2019-12-03 ENCOUNTER — Other Ambulatory Visit: Payer: Self-pay | Admitting: Cardiology

## 2019-12-03 DIAGNOSIS — S098XXA Other specified injuries of head, initial encounter: Secondary | ICD-10-CM

## 2019-12-03 DIAGNOSIS — G4733 Obstructive sleep apnea (adult) (pediatric): Secondary | ICD-10-CM | POA: Diagnosis not present

## 2019-12-03 DIAGNOSIS — E7800 Pure hypercholesterolemia, unspecified: Secondary | ICD-10-CM

## 2019-12-03 DIAGNOSIS — Z8673 Personal history of transient ischemic attack (TIA), and cerebral infarction without residual deficits: Secondary | ICD-10-CM

## 2019-12-03 DIAGNOSIS — H53482 Generalized contraction of visual field, left eye: Secondary | ICD-10-CM

## 2019-12-03 DIAGNOSIS — I639 Cerebral infarction, unspecified: Secondary | ICD-10-CM

## 2019-12-04 ENCOUNTER — Other Ambulatory Visit: Payer: Self-pay

## 2019-12-07 ENCOUNTER — Other Ambulatory Visit: Payer: Self-pay | Admitting: Cardiology

## 2019-12-07 DIAGNOSIS — I10 Essential (primary) hypertension: Secondary | ICD-10-CM

## 2019-12-18 ENCOUNTER — Other Ambulatory Visit: Payer: Self-pay | Admitting: Cardiology

## 2019-12-18 DIAGNOSIS — I10 Essential (primary) hypertension: Secondary | ICD-10-CM

## 2019-12-19 DIAGNOSIS — I639 Cerebral infarction, unspecified: Secondary | ICD-10-CM | POA: Insufficient documentation

## 2019-12-19 NOTE — Progress Notes (Signed)
Summary & Diagnosis:   No sleep apnea of clinical significance noted- Therefor OSA is   not a risk factor for her strokes.   Recommendations:    No sleep related intervention is needed.   Interpreting Physician: Larey Seat, MD

## 2019-12-19 NOTE — Procedures (Addendum)
Patient Information     First Name: Erin Last Name: [name] ID: [id number]  Birth Date: 15-Apr-2046 Age: 74 Gender: Female  Referring Provider: [name], MD BMI: 28.2 (W=165 lb, H=5' 4'')  Neck Circ.:  14 ''  Sleep Study Information    Study Date: 12/03/19 S/H/A Version: 001.001.001.001 / 4.1.1528 / 32  History:    [name] is a 74 y.o. year old African American female patient seen here for RV  by [name] on 07/28/2019. This Patient was originally seen for work related concussion- I had ordered an MRI with and without contrast to evaluate her brain after I found evidence of homonymous hemianopsia and aphasia when we first met.  This MRI was performed and compared to a CT of the head obtained in the emergency room on 06-05-2019.  The MRI revealed : the brain parenchyma showed multiple right frontoparietal subcortical patchy lesions which are also seen in flair and indicate late and subacute infarcts.  There is a left occipital infarct which does not show deep to accentuation and is likely a more recent infarct.  There are age-appropriate changes of chronic small vessel disease, subarachnoid spaces and ventricular system shows slight agitation.  Postcontrast images did not result in areas of acute abnormality and enhancement.   The conclusion was that this is an abnormal MRI showing multiple strokes involving over time.  This does not mean that she could have had a cluster of strokes at the same time and later another set of strokes.  However, I am much more concerned about her aphasia being a result of left sided temporoparietal changes and the left occipital infarct as described by Dr. [name] being related to the homonymous hemianopsia.  The symptoms did not present before her head trauma.  Her skull shows structure changes consistent with a history of myeloma and patchy lesions.  There is some brain atrophy.  Her gait is improving as she is continuing with physical therapy and her speech has become more  fluent. The patient has had PT and ST for the last 2 weeks and improved gait and fluency of speech. Loss of taste and smell reported.   Summary & Diagnosis:     No sleep apnea of clinical significance noted- Therefor OSA is  not a risk factor for her strokes.   Recommendations:     No sleep related intervention is needed.   Interpreting Physician: Larey Seat, MD             Sleep Summary    Oxygen Saturation Statistics     Start Study Time: End Study Time: Total Recording Time:  7:58:03 PM 6:02:04 AM 10 h, 4 min  Total Sleep Time % REM of Sleep Time:  7 h, 50 min  13.4    Mean: 96 Minimum: 94 Maximum: 99  Mean of Desaturations Nadirs (%):   95  Oxygen Desaturation. %: 4-9 10-20 >20 Total  Events Number Total  1 100.0  0 0.0  0 0.0  1 100.0  Oxygen Saturation: <90 <=88 <85 <80 <70  Duration (minutes): Sleep % 0.0 0.0 0.0 0.0 0.0 0.0 0.0 0.0 0.0 0.0     Respiratory Indices      Total Events REM NREM All Night  pRDI:  12  pAHI:  7 ODI:  1  pAHIc:  0  % CSR: 0.0 7.3 2.4 0.0 0.0 3.1 2.1 0.4 0.0 3.6 2.1 0.3 0.0       Pulse Rate Statistics during Sleep (BPM)  Mean:  63 Minimum: 44 Maximum: 115    Indices are calculated using technically valid sleep time of 3 h, 17 min. pRDI/pAHI are calculated using oxi desaturations ? 3%  Body Position Statistics  Position Supine Prone Right Left Non-Supine  Sleep (min) 262.0 0.5 153.5 54.0 208.0  Sleep % 55.7 0.1 32.6 11.5 44.2  pRDI 2.4 N/A 7.5 N/A 7.8  pAHI 1.2 N/A 4.5 N/A 5.2  ODI 0.4 N/A 0.0 N/A 0.0     Snoring Statistics Snoring Level (dB) >40 >50 >60 >70 >80 >Threshold (45)  Sleep (min) 149.7 34.1 7.5 0.0 0.0 57.5  Sleep % 31.8 7.2 1.6 0.0 0.0 12.2    Mean: 42 dB

## 2019-12-22 ENCOUNTER — Telehealth: Payer: Self-pay

## 2019-12-22 NOTE — Telephone Encounter (Signed)
I called pt, spoke with daughter, [name], per DPR. I discussed pt's sleep study results and recommendations. I reminded pt's daughter of the appt in September. Pt's daughter verbalized understanding of results. Pt's daughter had no questions at this time but was encouraged to call back if questions arise.

## 2019-12-22 NOTE — Telephone Encounter (Signed)
-----   Message from Larey Seat, MD sent at 12/19/2019 12:24 PM EDT ----- Summary & Diagnosis:   No sleep apnea of clinical significance noted- Therefor OSA is   not a risk factor for her strokes.   Recommendations:    No sleep related intervention is needed.   Interpreting Physician: Larey Seat, MD

## 2020-01-27 ENCOUNTER — Telehealth: Payer: Self-pay | Admitting: *Deleted

## 2020-01-27 NOTE — Telephone Encounter (Signed)
Patient daughter called stating that her FMLA forms need some corrections to be made. She states this is urgent because her job is only giving her 3 days for these corrections to be made. She would like a call ASAP. Her phone number is [phone].

## 2020-01-27 NOTE — Telephone Encounter (Signed)
Received an additional form for the patient's daughter from [place]. Completed that form and updated the previous form. Returned to [place] to forward.

## 2020-01-27 NOTE — Telephone Encounter (Signed)
Called the daughter and advised the forms was completed and faxed. I have confirmed the numbers on the form that needed correcting. Hilda Blades will refax for the patient.

## 2020-01-27 NOTE — Telephone Encounter (Signed)
Pt daughter called, have missing or need corrections on FMLA.Please call asap [phone]

## 2020-01-28 ENCOUNTER — Encounter: Payer: Self-pay | Admitting: Neurology

## 2020-02-03 ENCOUNTER — Ambulatory Visit: Payer: Medicare Other | Admitting: Adult Health

## 2020-02-12 ENCOUNTER — Other Ambulatory Visit: Payer: Self-pay | Admitting: Hematology & Oncology

## 2020-02-12 DIAGNOSIS — Z853 Personal history of malignant neoplasm of breast: Secondary | ICD-10-CM

## 2020-02-12 DIAGNOSIS — Z9889 Other specified postprocedural states: Secondary | ICD-10-CM

## 2020-03-05 ENCOUNTER — Other Ambulatory Visit: Payer: Self-pay

## 2020-03-05 ENCOUNTER — Ambulatory Visit
Admission: RE | Admit: 2020-03-05 | Discharge: 2020-03-05 | Disposition: A | Discharge status: Home / Self Care | Payer: Medicare Other | Source: Ambulatory Visit | Attending: Hematology & Oncology | Admitting: Hematology & Oncology

## 2020-03-05 DIAGNOSIS — Z9889 Other specified postprocedural states: Secondary | ICD-10-CM

## 2020-03-05 DIAGNOSIS — Z853 Personal history of malignant neoplasm of breast: Secondary | ICD-10-CM

## 2020-03-08 ENCOUNTER — Emergency Department (HOSPITAL_COMMUNITY)
Admission: EM | Admit: 2020-03-08 | Discharge: 2020-03-08 | Disposition: A | Discharge status: Home / Self Care | Payer: Medicare Other | Attending: Emergency Medicine | Admitting: Emergency Medicine

## 2020-03-08 ENCOUNTER — Encounter (HOSPITAL_COMMUNITY): Payer: Self-pay | Admitting: Emergency Medicine

## 2020-03-08 DIAGNOSIS — R079 Chest pain, unspecified: Secondary | ICD-10-CM | POA: Insufficient documentation

## 2020-03-08 DIAGNOSIS — Z5321 Procedure and treatment not carried out due to patient leaving prior to being seen by health care provider: Secondary | ICD-10-CM | POA: Insufficient documentation

## 2020-03-08 DIAGNOSIS — R0602 Shortness of breath: Secondary | ICD-10-CM | POA: Insufficient documentation

## 2020-03-08 NOTE — ED Triage Notes (Signed)
Pt  Family only wants a ekg to be done and is then going to take pt home

## 2020-03-08 NOTE — ED Triage Notes (Signed)
Pt here with c/o chest pian that woke her up at 2 am along with some sob , no n/v

## 2020-03-17 ENCOUNTER — Ambulatory Visit (INDEPENDENT_AMBULATORY_CARE_PROVIDER_SITE_OTHER): Payer: Medicare Other | Admitting: Neurology

## 2020-03-17 ENCOUNTER — Other Ambulatory Visit: Payer: Self-pay

## 2020-03-17 ENCOUNTER — Encounter: Payer: Self-pay | Admitting: Neurology

## 2020-03-17 VITALS — BP 171/80 | HR 66 | Ht 62.0 in | Wt 155.0 lb

## 2020-03-17 DIAGNOSIS — I639 Cerebral infarction, unspecified: Secondary | ICD-10-CM | POA: Diagnosis not present

## 2020-03-17 DIAGNOSIS — S069X1A Unspecified intracranial injury with loss of consciousness of 30 minutes or less, initial encounter: Secondary | ICD-10-CM

## 2020-03-17 DIAGNOSIS — R4189 Other symptoms and signs involving cognitive functions and awareness: Secondary | ICD-10-CM

## 2020-03-17 DIAGNOSIS — I69354 Hemiplegia and hemiparesis following cerebral infarction affecting left non-dominant side: Secondary | ICD-10-CM

## 2020-03-17 DIAGNOSIS — H53489 Generalized contraction of visual field, unspecified eye: Secondary | ICD-10-CM
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
## 2020-03-17 NOTE — Patient Instructions (Signed)
Traumatic Brain Injury Traumatic brain injury (TBI) is an injury to the brain that results from:  A hard, direct blow to the head (closed injury).  An object penetrating the skull and entering the brain (open injury). Traumatic brain injury is also called a head injury or a concussion. TBI can be mild, moderate, or severe. What are the causes? Common causes of this condition include:  Falls.  Motor vehicle accidents.  Sports injuries.  Assaults. What increases the risk? You are more likely to develop this condition if you:  Are 75 years old or older.  Are a man.  Play contact sports, especially football, hockey, or soccer.  Are in the military.  Are a victim of violence.  Abuse drugs or alcohol.  Have had a previous TBI. What are the signs or symptoms? Symptoms may vary from person to person, and may include:  Loss of consciousness.  Headache.  Confusion.  Fatigue.  Changes in sleep.  Dizziness.  Mood or personality changes.  Memory problems.  Nausea or vomiting or both.  Seizures.  Clumsiness.  Slurred speech.  Depression and anxiety.  Anger.  Trouble concentrating, organizing, or making decisions.  Inability to control emotions or actions (impulse control).  Loss of or dulling of the senses, such as hearing, vision, and touch. This can include: ? Blurred vision. ? Ringing in your ears. How is this diagnosed? This condition may be diagnosed based on:  Medical history and physical exam.  Neurologic exam. This checks for brain and nervous system function, including your reflexes, memory, and coordination.  CT scan. Your TBI may be described as mild, moderate, or severe. How is this treated? Treatment depends on the severity of your brain injury and may include:  Breathing support (mechanical ventilation).  Blood pressure medicines.  Pain medicines.  Treatments to decrease the swelling in your brain.  Brain surgery. This may be  needed to: ? Remove a blood clot. ? Repair bleeding. ? Remove an object that has penetrated the brain, such as a skull fragment or a bullet. Treatment of TBI also includes:  Physical and mental rest.  Careful observation.  Medicine. You may be prescribed medicines to help with symptoms such as headaches, nausea, or difficulty sleeping.  Physical, occupational, and speech therapy.  Referral to a concussion clinic or rehabilitation center. Follow these instructions at home: Medicines  Take over-the-counter and prescription medicines only as told by your health care provider.  Do not take blood thinners (anticoagulants), aspirin, or other anti-inflammatory medicines such as ibuprofen or naproxen unless approved by your health care provider. Activity  Rest. Rest helps the brain to heal. Make sure you: ? Get plenty of sleep. Most adults should get 7-9 hours of sleep each night. ? Rest during the day. Take daytime naps or rest breaks when you feel tired.  Do not do high-risk activities that could cause a second concussion, such as riding a bike or playing sports. Having another concussion before the first one has healed can be dangerous.  Avoid a lot of visual stimulation. This includes work on the computer or phone, watching TV, and reading.  Ask your health care provider what kind of activities are safe for you. Your ability to react may be slower after a brain injury. Never do these activities if you are dizzy. Your health care provider will likely give you a plan for gradually returning to activities. General instructions  Do not drink alcohol.  Watch your symptoms and tell others to do the   same. Complications sometimes occur after a brain injury. Older adults with a brain injury may have a higher risk of serious complications.  Seek support from friends and family.  Keep all follow-up visits as directed by your health care provider. This is important. Contact a health care  provider if:  Your symptoms get worse or they do not improve.  You have new symptoms.  You have another injury. Get help right away if:  You have: ? Severe, persistent headaches that are not relieved by medicine. ? Weakness or numbness in any part of your body. ? Confusion. ? Slurred speech. ? Difficulty waking up. ? Nausea or persistent vomiting. ? A feeling like you are moving when you are not (vertigo). ? Seizures or you faint. ? Changes in your vision. ? Clear or bloody discharge from your nose or ears.  You cannot use your arms or legs normally. Summary  Traumatic brain injury happens when there is a hard, direct blow to the head or when an object penetrates the skull and enters the brain.  Traumatic brain injuries may be mild, moderate, or severe. Treatment depends on the severity of your injury.  Get help right away if you have a head injury and you develop seizures, confusion, vomiting, weakness in the arms or legs, slurred speech, and other symptoms.  Rest is one of the best treatments. Do not return to activity until your health care provider approves. This information is not intended to replace advice given to you by your health care provider. Make sure you discuss any questions you have with your health care provider. Document Revised: 09/12/2017 Document Reviewed: 06/25/2017 [employer] Patient Education  [place].

## 2020-03-17 NOTE — Addendum Note (Signed)
Addended by: Larey Seat on: 03/17/2020 12:24 PM   Modules accepted: Orders

## 2020-03-22 ENCOUNTER — Other Ambulatory Visit: Payer: Self-pay

## 2020-03-22 DIAGNOSIS — I10 Essential (primary) hypertension: Secondary | ICD-10-CM

## 2020-03-22 MED ORDER — ATENOLOL 100 MG PO TABS: ORAL_TABLET | ORAL | 1 refills | Status: DC

## 2020-05-18 ENCOUNTER — Encounter: Payer: Self-pay | Admitting: Neurology

## 2020-05-18 ENCOUNTER — Telehealth: Payer: Self-pay | Admitting: Neurology

## 2020-05-18 NOTE — Telephone Encounter (Signed)
Paperwork was on desk to be completed and was needing to be faxed to [facility] by 05/21/2020. I have completed the form and faxed to the number on the form for the patient's daughter.

## 2020-06-16 ENCOUNTER — Telehealth: Payer: Self-pay | Admitting: Neurology

## 2020-06-16 DIAGNOSIS — S069X1A Unspecified intracranial injury with loss of consciousness of 30 minutes or less, initial encounter: Secondary | ICD-10-CM

## 2020-06-16 DIAGNOSIS — I639 Cerebral infarction, unspecified: Secondary | ICD-10-CM

## 2020-06-16 DIAGNOSIS — I69354 Hemiplegia and hemiparesis following cerebral infarction affecting left non-dominant side: Secondary | ICD-10-CM

## 2020-06-16 NOTE — Telephone Encounter (Signed)
Berlinda Last from [facility] is asking if doctor wants the PT office to see pt. She states physical therapist & pt. are wanting to discharge case. Please advise.

## 2020-06-16 NOTE — Telephone Encounter (Signed)
Called the PT back to inquire on the patient's progression. The therapist state that the patient is physically ready for dc. She completes her exercises with no problems while in the the therapy. There was concern for she may forget while at home. She has met her goals and is ready for dc. They are requested a order be sent ok for dc to their office.

## 2020-06-16 NOTE — Addendum Note (Signed)
Addended by: Darleen Crocker on: 06/16/2020 03:35 PM   Modules accepted: Orders

## 2020-07-22 ENCOUNTER — Encounter: Payer: Self-pay | Admitting: Neurology

## 2020-07-22 ENCOUNTER — Ambulatory Visit (INDEPENDENT_AMBULATORY_CARE_PROVIDER_SITE_OTHER): Payer: Medicare Other | Admitting: Neurology

## 2020-07-22 VITALS — BP 137/67 | HR 55 | Ht 62.0 in | Wt 149.0 lb

## 2020-07-22 DIAGNOSIS — F0391 Unspecified dementia with behavioral disturbance: Secondary | ICD-10-CM

## 2020-07-22 MED ORDER — DONEPEZIL HCL 5 MG PO TABS: 5.0000 mg | ORAL_TABLET | Freq: Every day | ORAL | 3 refills | Status: DC
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
## 2020-07-22 NOTE — Patient Instructions (Signed)
Alzheimer's Disease Caregiver Guide Alzheimer's disease is a condition that makes a person:  Forget things.  Act differently.  Have trouble paying attention and doing simple tasks. These things get worse with time. The tips below can help you care for the person. How to help manage lifestyle changes Tips to help with symptoms  Be calm and patient.  Give simple, short answers to questions.  Avoid correcting the person in a negative way.  Try not to take things personally, even if the person forgets your name.  Do not argue with the person. This may make the person more upset. Tips to lessen frustration  Make appointments and do daily tasks when the person is at his or her best.  Take your time. Simple tasks may take longer. Allow plenty of time to complete tasks.  Limit choices for the person.  Involve the person in what you are doing.  Keep things organized: ? Keep a daily routine. ? Organize medicines in a pillbox for each day of the week. ? Keep a calendar in a central location to remind the person of meetings or other activities.  Avoid new or crowded places, if possible.  Use simple words, short sentences, and a calm voice. Only give one direction at a time.  Buy clothes and shoes that are easy to put on and take off.  Try to change the subject if the person becomes frustrated or angry. Tips to prevent injury  Keep floors clear. Remove rugs, magazine racks, and floor lamps.  Keep hallways well-lit.  Put a handrail and non-slip mat in the bathtub or shower.  Put childproof locks on cabinets that have dangerous items in them. These items include medicine, alcohol, guns, toxic cleaning items, sharp tools, matches, and lighters.  Put locks on doors where the person cannot see or reach them. This helps keep the person from going out of the house and getting lost.  Be ready for emergencies. Keep a list of emergency phone numbers and addresses close  by.  Remove car keys and lock garage doors so that the person does not try to drive.  Bracelets may be worn that track location and identify the person as having memory problems. This should be worn at all times for safety.   Tips for the future  Discuss financial and legal planning early. People with this disease have trouble managing their money as the disease gets worse. Get help from a professional.  Talk about advance directives, safety, and daily care. Take these steps: ? Create a living will and choose a power of attorney. This is someone who can make decisions for the person with Alzheimer's disease when he or she can no longer do so. ? Discuss driving safety and when to stop driving. The person's doctor can help with this. ? If the person lives alone, make sure he or she is safe. Some people need extra help at home. Other people need more care at a nursing home or care center.   How to recognize changes in the person's condition With this disease, memory problems and confusion slowly get worse. In time, the person may not know his or her friends and family members. The disease can also cause changes in behavior and mood, such as anxiety or anger. The person may see, hear, taste, smell, or feel things that are not real (hallucinate). These changes can come on all of a sudden. They may happen in response to something such as:  Pain.  An infection.  Changes in temperature or noise.  Too much stimulation.  Feeling lost or scared.  Medicines. Where to find support  Find out about services that can provide short-term care (respite care). These can allow you to take a break when you need it.  Join a support group near you. These groups can help you: ? Learn ways to manage stress. ? Share experiences with others. ? Get emotional comfort and support. ? Learn about caregiving as the disease gets worse. ? Know what community resources are available. Where to find more  information  Alzheimer's Association: [employer] Contact a doctor if:  The person has a fever.  The person has a sudden behavior change that does not get better with calming strategies.  The person is not able to take care of himself or herself at home.  You are no longer able to care for the person. Get help right away if:  The person has a sudden increase in confusion or new hallucinations.  The person threatens you or anyone else, including himself or herself. Get help right away if you feel like your loved one may hurt himself or herself or others, or has thoughts about taking his or her own life. Go to your nearest emergency room or:  Call your local emergency services (911 in the [place]  Call the [facility] at (740)189-0801. This is open 24 hours a day.  Text the Crisis Text Line at 9132370289. Summary  Alzheimer's disease causes a person to forget things.  A person who has this condition may have trouble doing simple tasks.  Take steps to keep the person from getting hurt. Plan for future care.  You can find support by joining a support group near you. This information is not intended to replace advice given to you by your health care provider. Make sure you discuss any questions you have with your health care provider. Document Revised: 08/25/2019 Document Reviewed: 08/25/2019 Elsevier Patient Education  2021 [place]. We plan to add this drug in 3-6 month -Memantine Tablets What is this medicine? MEMANTINE (MEM an teen) is used to treat dementia caused by Alzheimer's disease. This medicine may be used for other purposes; ask your health care provider or pharmacist if you have questions. COMMON BRAND NAME(S): Namenda What should I tell my health care provider before I take this medicine? They need to know if you have any of these conditions:  difficulty passing urine  kidney disease  liver disease  seizures  an unusual or  allergic reaction to memantine, other medicines, foods, dyes, or preservatives  pregnant or trying to get pregnant  breast-feeding How should I use this medicine? Take this medicine by mouth with a glass of water. Follow the directions on the prescription label. You may take this medicine with or without food. Take your doses at regular intervals. Do not take your medicine more often than directed. Continue to take your medicine even if you feel better. Do not stop taking except on the advice of your doctor or health care professional. Talk to your pediatrician regarding the use of this medicine in children. Special care may be needed. Overdosage: If you think you have taken too much of this medicine contact a poison control center or emergency room at once. NOTE: This medicine is only for you. Do not share this medicine with others. What if I miss a dose? If you miss a dose, take it as soon as you can. If it is  almost time for your next dose, take only that dose. Do not take double or extra doses. If you do not take your medicine for several days, contact your health care provider. Your dose may need to be changed. What may interact with this medicine?  acetazolamide  amantadine  cimetidine  dextromethorphan  dofetilide  hydrochlorothiazide  ketamine  metformin  methazolamide  quinidine  ranitidine  sodium bicarbonate  triamterene This list may not describe all possible interactions. Give your health care provider a list of all the medicines, herbs, non-prescription drugs, or dietary supplements you use. Also tell them if you smoke, drink alcohol, or use illegal drugs. Some items may interact with your medicine. What should I watch for while using this medicine? Visit your doctor or health care professional for regular checks on your progress. Check with your doctor or health care professional if there is no improvement in your symptoms or if they get worse. You may get  drowsy or dizzy. Do not drive, use machinery, or do anything that needs mental alertness until you know how this drug affects you. Do not stand or sit up quickly, especially if you are an older patient. This reduces the risk of dizzy or fainting spells. Alcohol can make you more drowsy and dizzy. Avoid alcoholic drinks. What side effects may I notice from receiving this medicine? Side effects that you should report to your doctor or health care professional as soon as possible:  allergic reactions like skin rash, itching or hives, swelling of the face, lips, or tongue  agitation or a feeling of restlessness  depressed mood  dizziness  hallucinations  redness, blistering, peeling or loosening of the skin, including inside the mouth  seizures  vomiting Side effects that usually do not require medical attention (report to your doctor or health care professional if they continue or are bothersome):  constipation  diarrhea  headache  nausea  trouble sleeping This list may not describe all possible side effects. Call your doctor for medical advice about side effects. You may report side effects to FDA at 1-800-FDA-1088. Where should I keep my medicine? Keep out of the reach of children. Store at room temperature between 15 degrees and 30 degrees C (59 degrees and 86 degrees F). Throw away any unused medicine after the expiration date. NOTE: This sheet is a summary. It may not cover all possible information. If you have questions about this medicine, talk to your doctor, pharmacist, or health care provider.  2021 Elsevier/Gold Standard (2013-02-24 14:10:42) Started today Donepezil tablets What is this medicine? DONEPEZIL (doe NEP e zil) is used to treat mild to moderate dementia caused by Alzheimer's disease. This medicine may be used for other purposes; ask your health care provider or pharmacist if you have questions. COMMON BRAND NAME(S): Aricept What should I tell my health  care provider before I take this medicine? They need to know if you have any of these conditions:  asthma or other lung disease  difficulty passing urine  head injury  heart disease  history of irregular heartbeat  liver disease  seizures (convulsions)  stomach or intestinal disease, ulcers or stomach bleeding  an unusual or allergic reaction to donepezil, other medicines, foods, dyes, or preservatives  pregnant or trying to get pregnant  breast-feeding How should I use this medicine? Take this medicine by mouth with a glass of water. Follow the directions on the prescription label. You may take this medicine with or without food. Take this medicine at regular  intervals. This medicine is usually taken before bedtime. Do not take it more often than directed. Continue to take your medicine even if you feel better. Do not stop taking except on your doctor's advice. If you are taking the 23 mg donepezil tablet, swallow it whole; do not cut, crush, or chew it. Talk to your pediatrician regarding the use of this medicine in children. Special care may be needed. Overdosage: If you think you have taken too much of this medicine contact a poison control center or emergency room at once. NOTE: This medicine is only for you. Do not share this medicine with others. What if I miss a dose? If you miss a dose, take it as soon as you can. If it is almost time for your next dose, take only that dose, do not take double or extra doses. What may interact with this medicine? Do not take this medicine with any of the following medications:  certain medicines for fungal infections like itraconazole, fluconazole, posaconazole, and voriconazole  cisapride  dextromethorphan; quinidine  dronedarone  pimozide  quinidine  thioridazine This medicine may also interact with the following medications:  antihistamines for allergy, cough and cold  atropine  bethanechol  carbamazepine  certain  medicines for bladder problems like oxybutynin, tolterodine  certain medicines for Parkinson's disease like benztropine, trihexyphenidyl  certain medicines for stomach problems like dicyclomine, hyoscyamine  certain medicines for travel sickness like scopolamine  dexamethasone  dofetilide  ipratropium  NSAIDs, medicines for pain and inflammation, like ibuprofen or naproxen  other medicines for Alzheimer's disease  other medicines that prolong the QT interval (cause an abnormal heart rhythm)  phenobarbital  phenytoin  rifampin, rifabutin or rifapentine  ziprasidone This list may not describe all possible interactions. Give your health care provider a list of all the medicines, herbs, non-prescription drugs, or dietary supplements you use. Also tell them if you smoke, drink alcohol, or use illegal drugs. Some items may interact with your medicine. What should I watch for while using this medicine? Visit your doctor or health care professional for regular checks on your progress. Check with your doctor or health care professional if your symptoms do not get better or if they get worse. You may get drowsy or dizzy. Do not drive, use machinery, or do anything that needs mental alertness until you know how this drug affects you. What side effects may I notice from receiving this medicine? Side effects that you should report to your doctor or health care professional as soon as possible:  allergic reactions like skin rash, itching or hives, swelling of the face, lips, or tongue  feeling faint or lightheaded, falls  loss of bladder control  seizures  signs and symptoms of a dangerous change in heartbeat or heart rhythm like chest pain; dizziness; fast or irregular heartbeat; palpitations; feeling faint or lightheaded, falls; breathing problems  signs and symptoms of infection like fever or chills; cough; sore throat; pain or trouble passing urine  signs and symptoms of liver  injury like dark yellow or brown urine; general ill feeling or flu-like symptoms; light-colored stools; loss of appetite; nausea; right upper belly pain; unusually weak or tired; yellowing of the eyes or skin  slow heartbeat or palpitations  unusual bleeding or bruising  vomiting Side effects that usually do not require medical attention (report to your doctor or health care professional if they continue or are bothersome):  diarrhea, especially when starting treatment  headache  loss of appetite  muscle cramps  nausea  stomach upset This list may not describe all possible side effects. Call your doctor for medical advice about side effects. You may report side effects to FDA at 1-800-FDA-1088. Where should I keep my medicine? Keep out of reach of children. Store at room temperature between 15 and 30 degrees C (59 and 86 degrees F). Throw away any unused medicine after the expiration date. NOTE: This sheet is a summary. It may not cover all possible information. If you have questions about this medicine, talk to your doctor, pharmacist, or health care provider.  2021 Elsevier/Gold Standard (2018-04-29 10:33:41)

## 2020-09-19 ENCOUNTER — Other Ambulatory Visit: Payer: Self-pay | Admitting: Cardiology

## 2020-09-19 DIAGNOSIS — I10 Essential (primary) hypertension: Secondary | ICD-10-CM

## 2020-09-20 ENCOUNTER — Other Ambulatory Visit: Payer: Self-pay

## 2020-09-20 DIAGNOSIS — I10 Essential (primary) hypertension: Secondary | ICD-10-CM

## 2020-09-20 MED ORDER — AMLODIPINE BESYLATE 10 MG PO TABS: ORAL_TABLET | ORAL | 0 refills | Status: DC

## 2020-10-16 ENCOUNTER — Other Ambulatory Visit: Payer: Self-pay | Admitting: Neurology

## 2020-10-16 ENCOUNTER — Other Ambulatory Visit: Payer: Self-pay | Admitting: Cardiology

## 2020-10-16 DIAGNOSIS — I10 Essential (primary) hypertension: Secondary | ICD-10-CM

## 2020-11-17 ENCOUNTER — Ambulatory Visit: Payer: Medicare Other | Admitting: Adult Health

## 2020-11-17 ENCOUNTER — Other Ambulatory Visit: Payer: Self-pay

## 2020-11-17 VITALS — BP 135/76 | HR 61 | Ht 62.0 in | Wt 151.0 lb

## 2020-11-17 DIAGNOSIS — R413 Other amnesia: Secondary | ICD-10-CM

## 2020-11-17 MED ORDER — DONEPEZIL HCL 10 MG PO TABS
10.0000 mg | ORAL_TABLET | Freq: Every day | ORAL | 5 refills | Status: DC
Start: 2020-11-17 — End: 2020-12-20

## 2020-11-17 NOTE — Patient Instructions (Signed)
Your Plan:  Increase aricept to 10 mg at bedtime Can consider starting namenda If your symptoms worsen or you develop new symptoms please let us know.   Thank you for coming to see [place] at [facility]. I hope we have been able to provide you high quality care today.  You may receive a patient satisfaction survey over the next few weeks. We would appreciate your feedback and comments so that we may continue to improve ourselves and the health of our patients.

## 2020-11-17 NOTE — Progress Notes (Addendum)
PATIENT: [name] DOB: 1945/10/20  REASON FOR VISIT: follow up HISTORY FROM: patient PRIMARY NEUROLOGIST: Dr. Brett Fairy  HISTORY OF PRESENT ILLNESS: Today 11/17/20:  [name] is a 75 year old female with a history of memory disturbance.  She returns today for follow-up.  She currently lives at home alone.  She is able to complete all ADLs independently.  She still prepares her own meals.  [facility] does [facility] [facility].  She has found this very beneficial.  She states that social interaction has been helpful for her memory.  She denies any changes in her mood or behavior.  She is currently on Aricept 5 mg at bedtime and has tolerated this well.  She returns today for an evaluation.   HISTORY (Copied from Dr.Dohmeier's note)  [name] is a 75 y.o. year old African American female patient seen here for RV  07/22/2020.  presents still with slurred speech, but overall improved. Dysphonia. Walking with a 4 prong cane- Todays MMSE is  similar to her neurocognitive assessment at 22/ 30- neuro-cognitve impairment.  MOCA at neuropsychologist was 9/30 !   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 Some brain atrophy was noted. 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  There is a dementia now, and it is not MILD. Workers comp is working with her and she may need now changes to her environment , assistance and social contacts.  Her physical recovery was greatly aided by PT and OT.          REVIEW OF SYSTEMS: Out of a complete 14 system review of symptoms, the patient complains only of the following symptoms, and all other reviewed systems are negative.  ALLERGIES: Allergies  Allergen Reactions   Lisinopril Other (See Comments) and Cough    Throat itching    HOME  MEDICATIONS: Outpatient Medications Prior to Visit  Medication Sig Dispense Refill   acetaminophen (TYLENOL) 325 MG tablet Take 650 mg by mouth every 6 (six) hours as needed.     amLODipine (NORVASC) 10 MG tablet TAKE 1 TABLET(10 MG) BY MOUTH EVERY EVENING 90 tablet 0   aspirin EC 81 MG tablet Take 81 mg by mouth daily.     atenolol (TENORMIN) 100 MG tablet TAKE 1 TABLET BY MOUTH EVERY DAY 90 tablet 1   donepezil (ARICEPT) 5 MG tablet Take 1 tablet (5 mg total) by mouth at bedtime. 30 tablet 3   doxylamine, Sleep, (UNISOM) 25 MG tablet Take 25 mg by mouth at bedtime as needed for sleep.      megestrol (MEGACE) 40 MG tablet Take 40 mg by mouth daily.     metFORMIN (GLUCOPHAGE) 500 MG tablet Take 500 mg by mouth daily.   1   omeprazole (PRILOSEC) 40 MG capsule Take 40 mg by mouth daily.     rosuvastatin (CRESTOR) 20 MG tablet TAKE 1/2 TABLET(10 MG) BY MOUTH DAILY 30 tablet 11   Cholecalciferol (VITAMIN D3) 5000 units CAPS Take 5,000 Units by mouth daily.     No facility-administered medications prior to visit.    PAST MEDICAL HISTORY: Past Medical History:  Diagnosis Date   Anemia    Arthralgia    chronic, 2nd degree to stem cell transplantation   Breast cancer ([place])    Breast cancer, stage 1 (  [place]) 09/09/2015   Chronic pain of left knee    Degenerative joint disease involving multiple joints 03/29/2011   Diabetes mellitus without complication [facility])    Patient reports she hasnt had Diabetes in years   DJD (degenerative joint disease)    E. coli gastroenteritis    GERD (gastroesophageal reflux disease)    Herpes simplex esophagitis 2006   History of blood transfusion    with first pregnancy   History of multiple myeloma    chemo XRT, Dr. 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   [facility], chemo tx at [place]-2006, radiation in 2008   BREAST LUMPECTOMY Right 11/2015   radiation and chemo   BREAST LUMPECTOMY WITH RADIOACTIVE SEED AND SENTINEL LYMPH NODE BIOPSY Right 09/22/2015   Procedure: BREAST LUMPECTOMY WITH RADIOACTIVE SEED AND SENTINEL LYMPH NODE BIOPSY;  Surgeon: Excell Seltzer, MD;  Location: [facility];  Service: General;  Laterality: Right;   BUBBLE STUDY  10/07/2019   Procedure: BUBBLE STUDY;  Surgeon: Adrian Prows, MD;  Location: [facility];  Service: Cardiovascular;;   COLONOSCOPY     IR REMOVAL TUN ACCESS W/ PORT W/O FL MOD SED  05/23/2018   JOINT REPLACEMENT Left    KNEE ARTHROSCOPY  05/2008   left   TEE WITHOUT CARDIOVERSION N/A 10/07/2019   Procedure: TRANSESOPHAGEAL ECHOCARDIOGRAM (TEE);  Surgeon: Adrian Prows, MD;  Location: [facility] ENDOSCOPY;  Service: Cardiovascular;  Laterality: N/A;   TOTAL KNEE ARTHROPLASTY  10/11/2010   left    FAMILY HISTORY: Family History  Problem Relation Age of Onset   Lung cancer Father        smoker   Liver cancer Father    Throat cancer Brother    Esophageal cancer Brother    Heart attack Mother    Diabetes Mother    Diabetes Sister    Colon polyps Daughter    Colon cancer Neg Hx    Rectal cancer Neg Hx    Stomach cancer Neg Hx    Breast cancer Neg Hx      SOCIAL HISTORY: Social History   Socioeconomic History   Marital status: Divorced    Spouse name: Not on file   Number of children: 3   Years of education: Not on file   Highest education level: Not on file  Occupational History   Occupation: disability    Employer: RETIRED  Tobacco Use   Smoking status: Never   Smokeless tobacco: Never   Tobacco comments:    never used tobacco  Vaping Use   Vaping Use: Never used  Substance and Sexual Activity   Alcohol use: No    Alcohol/week: 0.0 standard drinks   Drug use: No   Sexual activity: Not Currently  Other Topics Concern   Not on file  Social History Narrative         Social Determinants of Health   Financial Resource Strain: Not on file  Food Insecurity: Not on file  Transportation Needs: Not on file  Physical Activity: Not on file  Stress: Not on file  Social Connections: Not on file  Intimate Partner Violence: Not on file      PHYSICAL EXAM  Vitals:   11/17/20 1046  BP: 135/76  Pulse: 61  Weight: 151 lb (68.5 kg)  Height: _0  (1.575 m)   Body mass index is 27.62 kg/m.  MMSE - Mini Mental State Exam 11/17/2020 07/22/2020 03/17/2020  Orientation to time _1 Orientation to Place _2 Registration _3 Attention/ Calculation _4 Recall 1 0 0  Language- name 2 objects _5 Language- repeat 1 0 1  Language- follow 3 step command _6 Language- read & follow direction _7 Write a sentence _8 Copy design 0 0 0  Total score _9 Generalized: Well developed, in no acute distress   Neurological examination  Mentation: Alert oriented to person. Follows all commands speech and language fluent Cranial nerve II-XII: Pupils were equal round reactive to light. Extraocular movements were full, visual field were full on confrontational test. Facial sensation and strength were normal. Uvula tongue midline. Head turning and shoulder shrug  were normal and symmetric. Motor: The  motor testing reveals 5 over 5 strength of all 4 extremities. Good symmetric motor tone is noted throughout.  Sensory: Sensory testing is intact to soft touch on all 4 extremities. No evidence of extinction is noted.  Coordination: Cerebellar testing reveals good finger-nose-finger and heel-to-shin bilaterally.  Gait and station: Gait is normal. Reflexes: Deep tendon reflexes are symmetric and normal bilaterally.   DIAGNOSTIC DATA (LABS, IMAGING, TESTING) - I reviewed patient records, labs, notes, testing and imaging myself where available.  Lab Results  Component Value Date   WBC 4.4 07/09/2019   HGB 11.5 (L) 07/09/2019   HCT 36.7 07/09/2019   MCV 97.1 07/09/2019   PLT 347 07/09/2019      Component Value Date/Time   NA 145 (H) 10/03/2019 1423   NA 141 11/28/2016 0908   NA 142 10/22/2015 0950   K 4.8 10/03/2019 1423   K 4.0 11/28/2016 0908   K 4.2 10/22/2015 0950   CL 111 (H) 10/03/2019 1423   CL 106 11/28/2016 0908   CO2 17 (L) 10/03/2019 1423   CO2 26 11/28/2016 0908   CO2 24 10/22/2015 0950   GLUCOSE 89 10/03/2019 1423   GLUCOSE 129 (H) 07/09/2019 0948   GLUCOSE 130 (H) 11/28/2016 0908   BUN 27 10/03/2019 1423   BUN 12 11/28/2016 0908   BUN 14.0 10/22/2015 0950   CREATININE 1.88 (H) 10/03/2019 1423   CREATININE 1.74 (H) 07/09/2019 0948   CREATININE 1.6 (H) 11/28/2016 0908   CREATININE 1.6 (H) 10/22/2015 0950   CALCIUM 10.5 (H) 10/03/2019 1423   CALCIUM 9.6 11/28/2016 0908   CALCIUM 9.4 10/22/2015 0950   PROT 8.0 07/09/2019 0948   PROT 7.5 11/28/2016 0908   PROT 7.1 11/28/2016 0908   PROT 7.9 10/22/2015 0950  ALBUMIN 4.8 07/09/2019 0948   ALBUMIN 3.6 11/28/2016 0908   ALBUMIN 3.5 10/22/2015 0950   AST 14 (L) 07/09/2019 0948   AST 35 (H) 10/22/2015 0950   ALT 11 07/09/2019 0948   ALT 40 11/28/2016 0908   ALT 41 10/22/2015 0950   ALKPHOS 64 07/09/2019 0948   ALKPHOS 89 (H) 11/28/2016 0908   ALKPHOS 79 10/22/2015 0950   BILITOT 0.3 07/09/2019 0948   BILITOT  0.30 10/22/2015 0950   GFRNONAA 26 (L) 10/03/2019 1423   GFRNONAA 29 (L) 07/09/2019 0948   GFRAA 31 (L) 10/03/2019 1423   GFRAA 34 (L) 07/09/2019 0948   Lab Results  Component Value Date   CHOL 177 11/06/2019   HDL 34 (L) 11/06/2019   LDLCALC 122 (H) 11/06/2019   TRIG 117 11/06/2019   Lab Results  Component Value Date   HGBA1C 6.3 (H) 12/14/2015   Lab Results  Component Value Date   VITAMINB12 2,200 (H) 12/01/2015   No results found for: TSH    ASSESSMENT AND PLAN 75 y.o. year old female  has a past medical history of Anemia, Arthralgia, Breast cancer ([place]), Breast cancer, stage 1 ([place]) (09/09/2015), Chronic pain of left knee, Degenerative joint disease involving multiple joints (03/29/2011), Diabetes mellitus without complication ([place]), DJD (degenerative joint disease), E. coli gastroenteritis, GERD (gastroesophageal reflux disease), Herpes simplex esophagitis (2006), History of blood transfusion, History of multiple myeloma, History of radiation therapy (03/16/16-05/02/16), HSV-1 (herpes simplex virus 1) infection (12/01/2015), HSV-2 (herpes simplex virus 2) infection (12/05/2015), HTN (hypertension) (2005), Hyperlipidemia, Myeloma ([place]) (03/28/2011), OA (osteoarthritis), Paget's bone disease, Personal history of adenomatous colonic polyps (11/02/2011), PONV (postoperative nausea and vomiting), Radiation (09/28/2006 - 10/11/2006), Renal insufficiency, Scalp laceration (06/05/2019), Stroke ([place]), and Vitamin D deficiency. here with:  1.  Memory disturbance  -MMSE 21/30 previously 22 out of 30 - MOCA 16/30 -We will increase Aricept to 10 mg at bedtime -Advised if she tolerates this well they can call our office in 2 or 3 weeks and we will start the patient on Namenda 5 mg twice a day - Encourage patient to continue with PACE -Follow-up in 6 months or sooner if needed     Ward Givens, MSN, NP-C 11/17/2020, 11:03 AM [facility] [address] [place], [place] [address] [phone]

## 2020-11-19 NOTE — Progress Notes (Signed)
I agree with the assessment and plan as directed by NP on this visit . The patient had a significant Visual field abnormality in her initial visit, and she has abnormal CT and MRI of the brain, documented Paget's disease related bone lesions. My last visit with the patient took place with a case worker present, related to her original injury occurring at her then work place. She suffered a TBI. The position of worker's comp is that the dementia must have been present before the accident, but we have NO DOCUMENTATION of mental status changes before the accident. Her TBI has certainly accelerated if not originated her deficits.  I was available for consultation.   Larey Seat, MD

## 2020-12-17 ENCOUNTER — Encounter: Payer: Self-pay | Admitting: Neurology

## 2020-12-20 ENCOUNTER — Other Ambulatory Visit: Payer: Self-pay | Admitting: Neurology

## 2020-12-20 MED ORDER — DONEPEZIL HCL 5 MG PO TABS
5.0000 mg | ORAL_TABLET | Freq: Every day | ORAL | 5 refills | Status: DC
Start: 2020-12-20 — End: 2020-12-29

## 2020-12-29 ENCOUNTER — Other Ambulatory Visit: Payer: Self-pay | Admitting: Neurology

## 2020-12-29 ENCOUNTER — Other Ambulatory Visit: Payer: Self-pay | Admitting: *Deleted

## 2020-12-29 MED ORDER — DONEPEZIL HCL 5 MG PO TABS: 5.0000 mg | ORAL_TABLET | Freq: Every day | ORAL | 5 refills | Status: DC

## 2021-03-03 ENCOUNTER — Other Ambulatory Visit: Payer: Self-pay

## 2021-03-03 DIAGNOSIS — E7800 Pure hypercholesterolemia, unspecified: Secondary | ICD-10-CM

## 2021-03-03 MED ORDER — ROSUVASTATIN CALCIUM 20 MG PO TABS: ORAL_TABLET | ORAL | 0 refills | Status: DC

## 2021-03-26 ENCOUNTER — Other Ambulatory Visit: Payer: Self-pay | Admitting: Cardiology

## 2021-03-26 DIAGNOSIS — I10 Essential (primary) hypertension: Secondary | ICD-10-CM

## 2021-04-03 ENCOUNTER — Other Ambulatory Visit: Payer: Self-pay | Admitting: Cardiology

## 2021-04-03 DIAGNOSIS — I10 Essential (primary) hypertension: Secondary | ICD-10-CM

## 2021-04-11 ENCOUNTER — Telehealth: Payer: Self-pay | Admitting: Neurology

## 2021-04-11 ENCOUNTER — Encounter: Payer: Self-pay | Admitting: Neurology

## 2021-04-11 NOTE — Telephone Encounter (Signed)
A letter can be completed for the patient. Will submit today and contact the daughter as soon as it is complete.

## 2021-04-11 NOTE — Telephone Encounter (Signed)
Pt's daughter [name] called stating her mother received a letter to do [employer]. [name] is asking if the doctor could write up a letter stating her mothers condition and her inability to do this permanently. [name] states she needs to this letter ASAP. Madaline Savage Duty is set for 04/26/2021. [name] is requesting a call back.

## 2021-04-12 ENCOUNTER — Other Ambulatory Visit: Payer: Self-pay | Admitting: Hematology & Oncology

## 2021-04-12 DIAGNOSIS — Z1231 Encounter for screening mammogram for malignant neoplasm of breast: Secondary | ICD-10-CM

## 2021-04-13 ENCOUNTER — Telehealth: Payer: Self-pay | Admitting: Hematology & Oncology

## 2021-05-05 ENCOUNTER — Other Ambulatory Visit: Payer: Self-pay | Admitting: Neurology

## 2021-05-05 ENCOUNTER — Other Ambulatory Visit: Payer: Self-pay | Admitting: Cardiology

## 2021-05-05 DIAGNOSIS — E7800 Pure hypercholesterolemia, unspecified: Secondary | ICD-10-CM

## 2021-05-05 DIAGNOSIS — I10 Essential (primary) hypertension: Secondary | ICD-10-CM

## 2021-05-06 ENCOUNTER — Other Ambulatory Visit: Payer: Self-pay | Admitting: Cardiology

## 2021-05-06 DIAGNOSIS — I10 Essential (primary) hypertension: Secondary | ICD-10-CM

## 2021-05-06 MED ORDER — AMLODIPINE BESYLATE 10 MG PO TABS: 10.0000 mg | ORAL_TABLET | Freq: Every day | ORAL | 0 refills | Status: AC

## 2021-05-19 ENCOUNTER — Encounter: Payer: Self-pay | Admitting: Hematology & Oncology

## 2021-05-19 ENCOUNTER — Other Ambulatory Visit: Payer: Self-pay

## 2021-05-19 ENCOUNTER — Ambulatory Visit
Admission: RE | Admit: 2021-05-19 | Discharge: 2021-05-19 | Disposition: A | Discharge status: Home / Self Care | Payer: Medicare Other | Source: Ambulatory Visit | Attending: Hematology & Oncology | Admitting: Hematology & Oncology

## 2021-05-19 DIAGNOSIS — Z1231 Encounter for screening mammogram for malignant neoplasm of breast: Secondary | ICD-10-CM

## 2021-06-02 ENCOUNTER — Encounter: Payer: Self-pay | Admitting: Adult Health

## 2021-06-02 ENCOUNTER — Other Ambulatory Visit: Payer: Self-pay

## 2021-06-02 ENCOUNTER — Ambulatory Visit: Payer: Medicare Other | Admitting: Adult Health

## 2021-06-02 VITALS — BP 157/84 | HR 72 | Ht 63.0 in | Wt 164.0 lb

## 2021-06-02 DIAGNOSIS — F0390 Unspecified dementia without behavioral disturbance: Secondary | ICD-10-CM | POA: Diagnosis not present

## 2021-06-02 MED ORDER — MEMANTINE HCL 5 MG PO TABS: ORAL_TABLET | ORAL | 5 refills | Status: DC

## 2021-06-02 MED ORDER — DONEPEZIL HCL 5 MG PO TABS: 5.0000 mg | ORAL_TABLET | Freq: Every day | ORAL | 5 refills | Status: AC

## 2021-06-02 NOTE — Progress Notes (Signed)
PATIENT: [name] [place] DOB: Jun 13, 1945  REASON FOR VISIT: follow up HISTORY FROM: patient PRIMARY NEUROLOGIST: Dr. Brett Fairy  HISTORY OF PRESENT ILLNESS: Today 06/02/21:  [name] is a 76 year old female with a history of memory disturbance.  She returns today for follow-up.  She continues to live at home with her daughter.  She is able to complete all ADLs independently.  She does go to a day program twice a week.  Her daughter manages her medications, appointments and finances.  Daughter states that she can be agitated at times but is manageable.  They did try to increase Aricept to 10 mg but it caused diarrhea.  She has never tried [place] patient returns today for an evaluation  11/17/20: [name] is a 76 year old female with a history of memory disturbance.  She returns today for follow-up.  She currently lives at home alone.  She is able to complete all ADLs independently.  She still prepares her own meals.  She does [facility] a week.  She has found this very beneficial.  She states that social interaction has been helpful for her memory.  She denies any changes in her mood or behavior.  She is currently on Aricept 5 mg at bedtime and has tolerated this well.  She returns today for an evaluation.   HISTORY (Copied from Dr.Dohmeier's note)  [name] is a 76 y.o. year old African American female patient seen here for RV  07/22/2020.  presents still with slurred speech, but overall improved. Dysphonia. Walking with a 4 prong cane- Todays MMSE is  similar to her neurocognitive assessment at 22/ 30- neuro-cognitve impairment.  MOCA at neuropsychologist was 9/30 !   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 Some brain atrophy was noted. 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  There is a dementia now, and it is not MILD. Workers comp is working with her and she may need now changes to her environment , assistance and social contacts.  Her physical recovery was greatly aided by PT and OT.          REVIEW OF SYSTEMS: Out of a complete 14 system review of symptoms, the patient complains only of the following symptoms, and all other reviewed systems are negative.  ALLERGIES: Allergies  Allergen Reactions   Lisinopril Other (See Comments) and Cough    Throat itching    HOME MEDICATIONS: Outpatient Medications Prior to Visit  Medication Sig Dispense Refill   acetaminophen (TYLENOL) 325 MG tablet Take 650 mg by mouth every 6 (six) hours as needed.     amLODipine (NORVASC) 10 MG tablet Take 1 tablet (10 mg total) by mouth daily. Patient needs appointment for  further refills 30 tablet 0   aspirin EC 81 MG tablet Take 81 mg by mouth daily.     atenolol (TENORMIN) 100 MG tablet TAKE 1 TABLET BY MOUTH EVERY DAY 90 tablet 1   donepezil (ARICEPT) 5 MG tablet Take 1 tablet (5 mg total) by mouth at bedtime. 30 tablet 5   doxylamine, Sleep, (UNISOM) 25 MG tablet Take 25 mg by mouth at bedtime as needed for sleep.      megestrol (MEGACE) 40 MG tablet Take 40 mg by mouth daily.     omeprazole (PRILOSEC) 40 MG  capsule Take 40 mg by mouth daily.     rosuvastatin (CRESTOR) 20 MG tablet TAKE 1/2 TABLET(10 MG) BY MOUTH DAILY. NEED APPT FOR FURTHER REFILLS 30 tablet 0   Cholecalciferol (VITAMIN D3) 5000 units CAPS Take 5,000 Units by mouth daily.     metFORMIN (GLUCOPHAGE) 500 MG tablet Take 500 mg by mouth daily.   1   No facility-administered medications prior to visit.    PAST MEDICAL HISTORY: Past Medical History:  Diagnosis Date   Anemia    Arthralgia    chronic, 2nd degree to stem cell transplantation   Breast cancer ([place])    Breast cancer, stage 1 ([place]) 09/09/2015   Chronic pain of left knee    Degenerative joint disease involving multiple joints 03/29/2011    Diabetes mellitus without complication ([place])    Patient reports she hasnt had Diabetes in years   DJD (degenerative joint disease)    E. coli gastroenteritis    GERD (gastroesophageal reflux disease)    Herpes simplex esophagitis 2006   History of blood transfusion    with first pregnancy   History of multiple myeloma    chemo XRT, Dr. 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   [facility], chemo tx at [place]-2006, radiation in 2008   BREAST LUMPECTOMY Right 11/2015   radiation and chemo   BREAST LUMPECTOMY WITH RADIOACTIVE SEED AND SENTINEL LYMPH NODE BIOPSY Right 09/22/2015   Procedure: BREAST LUMPECTOMY WITH RADIOACTIVE SEED AND SENTINEL LYMPH NODE BIOPSY;  Surgeon: Excell Seltzer, MD;  Location: [facility];  Service: General;  Laterality: Right;   BUBBLE STUDY  10/07/2019   Procedure: BUBBLE STUDY;  Surgeon: Adrian Prows, MD;  Location: [facility];  Service: Cardiovascular;;   COLONOSCOPY     IR REMOVAL TUN ACCESS W/ PORT W/O FL MOD SED  05/23/2018   JOINT REPLACEMENT Left    KNEE ARTHROSCOPY  05/2008   left   TEE WITHOUT  CARDIOVERSION N/A 10/07/2019   Procedure: TRANSESOPHAGEAL ECHOCARDIOGRAM (TEE);  Surgeon: Adrian Prows, MD;  [facility] ENDOSCOPY;  Service: Cardiovascular;  Laterality: N/A;   TOTAL KNEE ARTHROPLASTY  10/11/2010   left    FAMILY HISTORY: Family History  Problem Relation Age of Onset   Heart attack Mother    Diabetes Mother    Lung cancer Father        smoker   Liver cancer Father    Diabetes Sister    Throat cancer Brother    Esophageal cancer Brother    Colon polyps Daughter    Colon cancer Neg Hx  Rectal cancer Neg Hx    Stomach cancer Neg Hx    Breast cancer Neg Hx    Memory loss Neg Hx    Dementia Neg Hx     SOCIAL HISTORY: Social History   Socioeconomic History   Marital status: Divorced    Spouse name: Not on file   Number of children: 3   Years of education: Not on file   Highest education level: Not on file  Occupational History   Occupation: disability    Employer: RETIRED  Tobacco Use   Smoking status: Never   Smokeless tobacco: Never   Tobacco comments:    never used tobacco  Vaping Use   Vaping Use: Never used  Substance and Sexual Activity   Alcohol use: No    Alcohol/week: 0.0 standard drinks   Drug use: No   Sexual activity: Not Currently  Other Topics Concern   Not on file  Social History Narrative         Social Determinants of Health   Financial Resource Strain: Not on file  Food Insecurity: Not on file  Transportation Needs: Not on file  Physical Activity: Not on file  Stress: Not on file  Social Connections: Not on file  Intimate Partner Violence: Not on file      PHYSICAL EXAM  Vitals:   06/02/21 1330  BP: (!) 157/84  Pulse: 72  Weight: 164 lb (74.4 kg)  Height: '5\' 3"'  (1.6 m)   Body mass index is 29.05 kg/m.  MMSE - Mini Mental State Exam 06/02/2021 11/17/2020 07/22/2020  Orientation to time '1 3 4  ' Orientation to Place '4 5 5  ' Registration '3 3 3  ' Attention/ Calculation 0 1 3  Recall 2 1 0  Language- name 2 objects  '2 2 2  ' Language- repeat 0 1 0  Language- follow 3 step command '2 3 3  ' Language- read & follow direction 0 1 1  Write a sentence 0 1 1  Copy design 0 0 0  Total score '14 21 22     ' Generalized: Well developed, in no acute distress   Neurological examination  Mentation: Alert oriented to person. Follows all commands speech and language fluent Cranial nerve II-XII: Pupils were equal round reactive to light. Extraocular movements were full, visual field were full on confrontational test. Facial sensation and strength were normal. Uvula tongue midline. Head turning and shoulder shrug  were normal and symmetric. Motor: The motor testing reveals 5 over 5 strength of all 4 extremities. Good symmetric motor tone is noted throughout.  Sensory: Sensory testing is intact to soft touch on all 4 extremities. No evidence of extinction is noted.  Coordination: Cerebellar testing reveals good finger-nose-finger and heel-to-shin bilaterally.  Gait and station: Gait is normal. Reflexes: Deep tendon reflexes are symmetric and normal bilaterally.   DIAGNOSTIC DATA (LABS, IMAGING, TESTING) - I reviewed patient records, labs, notes, testing and imaging myself where available.  Lab Results  Component Value Date   WBC 4.4 07/09/2019   HGB 11.5 (L) 07/09/2019   HCT 36.7 07/09/2019   MCV 97.1 07/09/2019   PLT 347 07/09/2019      Component Value Date/Time   NA 145 (H) 10/03/2019 1423   NA 141 11/28/2016 0908   NA 142 10/22/2015 0950   K 4.8 10/03/2019 1423   K 4.0 11/28/2016 0908   K 4.2 10/22/2015 0950   CL 111 (H) 10/03/2019 1423   CL 106 11/28/2016 0908   CO2 17 (L)  10/03/2019 1423   CO2 26 11/28/2016 0908   CO2 24 10/22/2015 0950   GLUCOSE 89 10/03/2019 1423   GLUCOSE 129 (H) 07/09/2019 0948   GLUCOSE 130 (H) 11/28/2016 0908   BUN 27 10/03/2019 1423   BUN 12 11/28/2016 0908   BUN 14.0 10/22/2015 0950   CREATININE 1.88 (H) 10/03/2019 1423   CREATININE 1.74 (H) 07/09/2019 0948   CREATININE  1.6 (H) 11/28/2016 0908   CREATININE 1.6 (H) 10/22/2015 0950   CALCIUM 10.5 (H) 10/03/2019 1423   CALCIUM 9.6 11/28/2016 0908   CALCIUM 9.4 10/22/2015 0950   PROT 8.0 07/09/2019 0948   PROT 7.5 11/28/2016 0908   PROT 7.1 11/28/2016 0908   PROT 7.9 10/22/2015 0950   ALBUMIN 4.8 07/09/2019 0948   ALBUMIN 3.6 11/28/2016 0908   ALBUMIN 3.5 10/22/2015 0950   AST 14 (L) 07/09/2019 0948   AST 35 (H) 10/22/2015 0950   ALT 11 07/09/2019 0948   ALT 40 11/28/2016 0908   ALT 41 10/22/2015 0950   ALKPHOS 64 07/09/2019 0948   ALKPHOS 89 (H) 11/28/2016 0908   ALKPHOS 79 10/22/2015 0950   BILITOT 0.3 07/09/2019 0948   BILITOT 0.30 10/22/2015 0950   GFRNONAA 26 (L) 10/03/2019 1423   GFRNONAA 29 (L) 07/09/2019 0948   GFRAA 31 (L) 10/03/2019 1423   GFRAA 34 (L) 07/09/2019 0948   Lab Results  Component Value Date   CHOL 177 11/06/2019   HDL 34 (L) 11/06/2019   LDLCALC 122 (H) 11/06/2019   TRIG 117 11/06/2019   Lab Results  Component Value Date   HGBA1C 6.3 (H) 12/14/2015   Lab Results  Component Value Date   VITAMINB12 2,200 (H) 12/01/2015   No results found for: TSH    ASSESSMENT AND PLAN 76 y.o. year old female  has a past medical history of Anemia, Arthralgia, Breast cancer ([place]), Breast cancer, stage 1 ([place]) (09/09/2015), Chronic pain of left knee, Degenerative joint disease involving multiple joints (03/29/2011), Diabetes mellitus without complication ([place]), DJD (degenerative joint disease), E. coli gastroenteritis, GERD (gastroesophageal reflux disease), Herpes simplex esophagitis (2006), History of blood transfusion, History of multiple myeloma, History of radiation therapy (03/16/16-05/02/16), HSV-1 (herpes simplex virus 1) infection (12/01/2015), HSV-2 (herpes simplex virus 2) infection (12/05/2015), HTN (hypertension) (2005), Hyperlipidemia, Myeloma ([place]) (03/28/2011), OA (osteoarthritis), Paget's bone disease, Personal history of adenomatous colonic polyps (11/02/2011), PONV  (postoperative nausea and vomiting), Radiation (09/28/2006 - 10/11/2006), Renal insufficiency, Scalp laceration (06/05/2019), Stroke ([place]), and Vitamin D deficiency. here with:  1.  Memory disturbance  -MMSE 14/30 previously 21/30 -Continue Aricept 5 mg at bedtime -Start Namenda 5 mg daily for 1 week then increase to 5 mg twice a day thereafter -Follow-up in 6 months or sooner if needed     Ward Givens, MSN, NP-C 06/02/2021, 1:35 PM [facility] [address] [place], [place] [address] [phone]

## 2021-06-02 NOTE — Patient Instructions (Signed)
Your Plan:  Continue aricept 5 mg at bedtime Start Namenda Take 1 tablet at bedtime for 1 week, then increase to 1 tablet twice a day thereafter  Thank you for coming to see [place] at [facility]. I hope we have been able to provide you high quality care today.  You may receive a patient satisfaction survey over the next few weeks. We would appreciate your feedback and comments so that we may continue to improve ourselves and the health of our patients.

## 2021-06-04 ENCOUNTER — Other Ambulatory Visit: Payer: Self-pay | Admitting: Cardiology

## 2021-06-04 DIAGNOSIS — I10 Essential (primary) hypertension: Secondary | ICD-10-CM

## 2021-06-06 DIAGNOSIS — Z0289 Encounter for other administrative examinations: Secondary | ICD-10-CM

## 2021-06-07 ENCOUNTER — Telehealth: Payer: Self-pay

## 2021-06-07 NOTE — Telephone Encounter (Signed)
Received family member care taker form for pt's daughter [name]). Discussed with MM, NP and she agreed to writing for f/u appts every 2 months and 3 hours for transportation back and forth from the office. Coverage for flare ups not approved.   Forms placed on NP's desk for review and signature.

## 2021-06-10 NOTE — Telephone Encounter (Signed)
Form signed.  To Medical records.

## 2021-06-15 ENCOUNTER — Other Ambulatory Visit: Payer: Self-pay | Admitting: Cardiology

## 2021-06-15 DIAGNOSIS — I10 Essential (primary) hypertension: Secondary | ICD-10-CM

## 2021-06-30 ENCOUNTER — Other Ambulatory Visit: Payer: Self-pay | Admitting: Adult Health

## 2021-07-02 ENCOUNTER — Other Ambulatory Visit: Payer: Self-pay | Admitting: Cardiology

## 2021-07-02 DIAGNOSIS — E7800 Pure hypercholesterolemia, unspecified: Secondary | ICD-10-CM

## 2021-07-08 ENCOUNTER — Ambulatory Visit: Payer: Medicare Other | Admitting: Hematology & Oncology

## 2021-07-08 ENCOUNTER — Other Ambulatory Visit: Payer: Medicare Other

## 2021-07-22 ENCOUNTER — Ambulatory Visit: Payer: Medicare Other | Admitting: Hematology & Oncology

## 2021-07-22 ENCOUNTER — Inpatient Hospital Stay: Payer: Medicare Other

## 2021-07-27 ENCOUNTER — Encounter: Payer: Self-pay | Admitting: Adult Health

## 2021-07-27 DIAGNOSIS — F0390 Unspecified dementia without behavioral disturbance: Secondary | ICD-10-CM

## 2021-08-01 NOTE — Telephone Encounter (Signed)
Order placed for hospital bed

## 2021-08-02 NOTE — Telephone Encounter (Addendum)
I called daughter of pt.  I wanted to confirm DME for electric bed. She said would be [employer] in [place].  I relayed I called [employer] and spoke to [place].  He stated that order can be faxed to them [phone] [address] in [place].  Will need Demographics and prescription and F2F note. I faxed to [employer] in [place] with fax confirmation.   ?

## 2021-08-03 NOTE — Telephone Encounter (Signed)
I called and LMVM for daughter, [name], stating looking at [facility] form for daughter re: to care of pt (her mother).   Section # per MM/NP only appt per Neurology (as listed on FMLA already.  Section 5 her request (was cut off could not read).  She can call me or email back to let us know what it said then MM/NP would make decision.   ?

## 2021-08-03 NOTE — Telephone Encounter (Signed)
Pt;'s daughter, Gardner Candle; on line 5 she need to mark "yes". Has to state "for personal family member care as needed indefinitely." ?Understand you can not speak for other doctors offices. ?

## 2021-08-09 ENCOUNTER — Other Ambulatory Visit: Payer: Self-pay | Admitting: *Deleted

## 2021-08-09 DIAGNOSIS — F0390 Unspecified dementia without behavioral disturbance: Secondary | ICD-10-CM

## 2021-08-09 DIAGNOSIS — R413 Other amnesia: Secondary | ICD-10-CM

## 2021-08-09 NOTE — Progress Notes (Signed)
DME needs order for semi electric bed.  ?

## 2021-08-11 NOTE — Telephone Encounter (Signed)
Done fax to [facility] and copy to Daughter.  ?

## 2021-08-11 NOTE — Telephone Encounter (Signed)
Completed Sect 5 changed to YES, and 1 one day per month for illness.  Signed and given to medical records.  ?

## 2021-08-11 NOTE — Telephone Encounter (Addendum)
Spoke to daughter since have not heard back.  She states that on FMLA section 5 should be yes, estimating time out for care when pt is ill.  She was not sure what to put there.  1 day / mon?  Only as needed, or what you thought?   FAX to [facility] [phone] or [phone], then copy to her, [name]- daughter.  ?

## 2021-08-31 NOTE — Telephone Encounter (Signed)
Yes, we can send this script. I 'll have RN check how long ago the last face to face visit would have to be and what the necessary wording has to say.  ?Larey Seat, MD  ?

## 2021-09-01 ENCOUNTER — Other Ambulatory Visit: Payer: Self-pay | Admitting: *Deleted

## 2021-09-01 DIAGNOSIS — C50911 Malignant neoplasm of unspecified site of right female breast: Secondary | ICD-10-CM

## 2021-09-01 DIAGNOSIS — C9001 Multiple myeloma in remission: Secondary | ICD-10-CM

## 2021-09-01 DIAGNOSIS — Z86010 Personal history of colonic polyps: Secondary | ICD-10-CM

## 2021-09-02 ENCOUNTER — Inpatient Hospital Stay (HOSPITAL_BASED_OUTPATIENT_CLINIC_OR_DEPARTMENT_OTHER): Payer: Medicare Other | Admitting: Hematology & Oncology

## 2021-09-02 ENCOUNTER — Other Ambulatory Visit: Payer: Self-pay

## 2021-09-02 ENCOUNTER — Inpatient Hospital Stay: Payer: Medicare Other | Attending: Hematology & Oncology

## 2021-09-02 ENCOUNTER — Encounter: Payer: Self-pay | Admitting: Hematology & Oncology

## 2021-09-02 VITALS — BP 158/86 | HR 85 | Temp 98.1°F | Resp 16 | Wt 157.0 lb

## 2021-09-02 DIAGNOSIS — Z7982 Long term (current) use of aspirin: Secondary | ICD-10-CM | POA: Insufficient documentation

## 2021-09-02 DIAGNOSIS — C9001 Multiple myeloma in remission: Secondary | ICD-10-CM | POA: Diagnosis present

## 2021-09-02 DIAGNOSIS — Z9484 Stem cells transplant status: Secondary | ICD-10-CM | POA: Insufficient documentation

## 2021-09-02 DIAGNOSIS — C50911 Malignant neoplasm of unspecified site of right female breast: Secondary | ICD-10-CM | POA: Diagnosis not present

## 2021-09-02 DIAGNOSIS — Z853 Personal history of malignant neoplasm of breast: Secondary | ICD-10-CM | POA: Diagnosis not present

## 2021-09-02 DIAGNOSIS — Z860100 Personal history of colon polyps, unspecified: Secondary | ICD-10-CM

## 2021-09-02 DIAGNOSIS — Z171 Estrogen receptor negative status [ER-]: Secondary | ICD-10-CM

## 2021-09-02 DIAGNOSIS — Z79899 Other long term (current) drug therapy: Secondary | ICD-10-CM | POA: Diagnosis not present

## 2021-09-02 LAB — CMP (CANCER CENTER ONLY)
ALT: 10 U/L (ref 0–44)
AST: 10 U/L — ABNORMAL LOW (ref 15–41)
Albumin: 4.5 g/dL (ref 3.5–5.0)
Alkaline Phosphatase: 73 U/L (ref 38–126)
Anion gap: 10 (ref 5–15)
BUN: 15 mg/dL (ref 8–23)
CO2: 24 mmol/L (ref 22–32)
Calcium: 10.2 mg/dL (ref 8.9–10.3)
Chloride: 107 mmol/L (ref 98–111)
Creatinine: 1.55 mg/dL — ABNORMAL HIGH (ref 0.44–1.00)
GFR, Estimated: 35 mL/min — ABNORMAL LOW (ref >60)
Glucose, Bld: 98 mg/dL (ref 70–99)
Potassium: 4.1 mmol/L (ref 3.5–5.1)
Sodium: 141 mmol/L (ref 135–145)
Total Bilirubin: 0.6 mg/dL (ref 0.3–1.2)
Total Protein: 7.7 g/dL (ref 6.5–8.1)

## 2021-09-02 LAB — CBC WITH DIFFERENTIAL (CANCER CENTER ONLY)
Abs Immature Granulocytes: 0.03 10*3/uL (ref 0.00–0.07)
Basophils Absolute: 0.1 10*3/uL (ref 0.0–0.1)
Basophils Relative: 1 %
Eosinophils Absolute: 0 10*3/uL (ref 0.0–0.5)
Eosinophils Relative: 1 %
HCT: 39.3 % (ref 36.0–46.0)
Hemoglobin: 12.9 g/dL (ref 12.0–15.0)
Immature Granulocytes: 1 %
Lymphocytes Relative: 19 %
Lymphs Abs: 1.1 10*3/uL (ref 0.7–4.0)
MCH: 31.2 pg (ref 26.0–34.0)
MCHC: 32.8 g/dL (ref 30.0–36.0)
MCV: 95.2 fL (ref 80.0–100.0)
Monocytes Absolute: 0.4 10*3/uL (ref 0.1–1.0)
Monocytes Relative: 7 %
Neutro Abs: 4.3 10*3/uL (ref 1.7–7.7)
Neutrophils Relative %: 71 %
Platelet Count: 290 10*3/uL (ref 150–400)
RBC: 4.13 MIL/uL (ref 3.87–5.11)
RDW: 12.9 % (ref 11.5–15.5)
WBC Count: 5.9 10*3/uL (ref 4.0–10.5)
nRBC: 0 % (ref 0.0–0.2)

## 2021-09-03 LAB — IGG, IGA, IGM
IgA: 357 mg/dL (ref 64–422)
IgG (Immunoglobin G), Serum: 1073 mg/dL (ref 586–1602)
IgM (Immunoglobulin M), Srm: 129 mg/dL (ref 26–217)

## 2021-09-05 ENCOUNTER — Telehealth: Payer: Self-pay | Admitting: *Deleted

## 2021-09-05 LAB — PROTEIN ELECTROPHORESIS, SERUM
A/G Ratio: 0.9 (ref 0.7–1.7)
Albumin ELP: 3.7 g/dL (ref 2.9–4.4)
Alpha-1-Globulin: 0.3 g/dL (ref 0.0–0.4)
Alpha-2-Globulin: 1.2 g/dL — ABNORMAL HIGH (ref 0.4–1.0)
Beta Globulin: 1.3 g/dL (ref 0.7–1.3)
Gamma Globulin: 1.2 g/dL (ref 0.4–1.8)
Globulin, Total: 4 g/dL — ABNORMAL HIGH (ref 2.2–3.9)
Total Protein ELP: 7.7 g/dL (ref 6.0–8.5)

## 2021-09-05 LAB — KAPPA/LAMBDA LIGHT CHAINS
Kappa free light chain: 39.8 mg/L — ABNORMAL HIGH (ref 3.3–19.4)
Kappa, lambda light chain ratio: 1.7 — ABNORMAL HIGH (ref 0.26–1.65)
Lambda free light chains: 23.4 mg/L (ref 5.7–26.3)

## 2021-09-05 NOTE — Telephone Encounter (Signed)
Received order from [facility] re: Hospital bed (semi electric) needs signature.  To inbox for review and sign.  ?

## 2021-09-06 ENCOUNTER — Encounter: Payer: Self-pay | Admitting: Hematology & Oncology

## 2021-09-06 NOTE — Progress Notes (Signed)
?Hematology and Oncology Follow Up Visit ? ?[name] [name] ?[id number] ?Oct 06, 1945 76 y.o. ?09/06/2021 ? ? 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 ?Current Therapy:   ?Observation ?    ?Interim History:  [name] is back for a long awaited follow-up.  We last saw her back in February 2021.  Unfortunately, [name] had an accident at work.  A wheelchair fell on top of her.  This was from some kind of lift.  The lift did not work properly.  It caused some damage to her brain.  [name] has not been able to come back since.  Her daughter comes in with her.  [name] will have to move in with her daughter because [name] is becoming less capable, cognitive standpoint. ? ?As far as her breast cancer and her myeloma goes, these really have not been a problem for her. ? ?[name] has had no problems with COVID.  [name] has had no issues with infections.  [name] has had no change in bowel or bladder habits. ? ?Last mammogram was done back on 05/19/2021.  This all look fine. ? ?Her myeloma studies that were lasted on her were unremarkable.  Today, there is no monoclonal spike in her blood.  Her IgG level was 1073 mg/dL.  Her kappa light chain was 4 mg/dL. ? ?[name] seems to be eating okay.  [name] does not have any problems with pain.  [name] is having no change in bowel or bladder habits.  There is been no problems with nausea or vomiting. ? ?Overall, I would say performance status is probably ECOG 2. ? ?Medications:  ?Current Outpatient Medications:  ?  acetaminophen (TYLENOL) 325 MG tablet, Take 650 mg by mouth every 6 (six) hours as needed., Disp: , Rfl:  ?  amLODipine (NORVASC) 10 MG tablet, Take 1 tablet (10 mg total) by mouth daily. Patient needs appointment for  further refills, Disp: 30 tablet, Rfl: 0 ?  aspirin EC 81 MG tablet, Take 81 mg by mouth daily., Disp: , Rfl:  ?  atenolol (TENORMIN) 100 MG tablet, TAKE 1 TABLET BY MOUTH  EVERY DAY, Disp: 90 tablet, Rfl: 1 ?  donepezil (ARICEPT) 5 MG tablet, Take 1 tablet (5 mg total) by mouth at bedtime., Disp: 30 tablet, Rfl: 5 ?  doxylamine, Sleep, (UNISOM) 25 MG tablet, Take 25 mg by mouth at bedtime as needed for sleep. , Disp: , Rfl:  ?  megestrol (MEGACE) 40 MG tablet, Take 40 mg by mouth daily., Disp: , Rfl:  ?  memantine (NAMENDA) 5 MG tablet, TAKE 1 TAB AT BEDTIME FOR 1 WEEK THEN INCREASE TO 1 TAB TWICE DAILY THEREAFTER, Disp: 180 tablet, Rfl: 2 ?  omeprazole (PRILOSEC) 40 MG capsule, Take 40 mg by mouth daily., Disp: , Rfl:  ?  rosuvastatin (CRESTOR) 20 MG tablet, TAKE 1/2 TABLET(10 MG) BY MOUTH DAILY. NEED APPT FOR FURTHER REFILLS, Disp: 30 tablet, Rfl: 0 ? ?Allergies:  ?Allergies  ?Allergen Reactions  ? Ace Inhibitors Other (See Comments)  ? Lisinopril Other (See Comments) and Cough  ?  Throat itching  ? ? ?Past Medical History, Surgical history, Social history, and Family History were reviewed and updated. ? ?Review of Systems: ?Review of Systems  ?Constitutional:  Positive for fatigue.  ?HENT:  Negative.    ?Eyes: Negative.   ?Respiratory: Negative.    ?Cardiovascular: Negative.   ?Gastrointestinal: Negative.   ?Endocrine: Negative.   ?  Genitourinary: Negative.    ?Musculoskeletal:  Positive for arthralgias and neck stiffness.  ?Skin: Negative.   ?Neurological:  Positive for dizziness and headaches.  ?Hematological: Negative.   ?Psychiatric/Behavioral: Negative.    ? ?Physical Exam: ? weight is 157 lb (71.2 kg). Her oral temperature is 98.1 ?F (36.7 ?C). Her blood pressure is 158/86 (abnormal) and her pulse is 85. Her respiration is 16 and oxygen saturation is 100%.  ? ?Wt Readings from Last 3 Encounters:  ?09/02/21 157 lb (71.2 kg)  ?06/02/21 164 lb (74.4 kg)  ?11/17/20 151 lb (68.5 kg)  ? ? ?Physical Exam ?Vitals reviewed.  ?Constitutional:   ?   Comments: Breast exam shows left breast with no masses, edema or erythema.  There is no left axillary adenopathy.  Right breast shows  well-healed lumpectomy.  There are some radiation changes.  There is no right axillary adenopathy.  ?HENT:  ?   Head: Normocephalic and atraumatic.  ?Eyes:  ?   Pupils: Pupils are equal, round, and reactive to light.  ?Cardiovascular:  ?   Rate and Rhythm: Normal rate and regular rhythm.  ?   Heart sounds: Normal heart sounds.  ?Pulmonary:  ?   Effort: Pulmonary effort is normal.  ?   Breath sounds: Normal breath sounds.  ?Abdominal:  ?   General: Bowel sounds are normal.  ?   Palpations: Abdomen is soft.  ?Musculoskeletal:     ?   General: No tenderness or deformity. Normal range of motion.  ?   Cervical back: Normal range of motion.  ?Lymphadenopathy:  ?   Cervical: No cervical adenopathy.  ?Skin: ?   General: Skin is warm and dry.  ?   Findings: No erythema or rash.  ?Neurological:  ?   Mental Status: [name] is alert and oriented to person, place, and time.  ?Psychiatric:     ?   Behavior: Behavior normal.     ?   Thought Content: Thought content normal.     ?   Judgment: Judgment normal.  ? ? ? ?Lab Results  ?Component Value Date  ? WBC 5.9 09/02/2021  ? HGB 12.9 09/02/2021  ? HCT 39.3 09/02/2021  ? MCV 95.2 09/02/2021  ? PLT 290 09/02/2021  ? ?  Chemistry   ?   ?Component Value Date/Time  ? NA 141 09/02/2021 0835  ? NA 145 (H) 10/03/2019 1423  ? NA 141 11/28/2016 0908  ? NA 142 10/22/2015 0950  ? K 4.1 09/02/2021 0835  ? K 4.0 11/28/2016 0908  ? K 4.2 10/22/2015 0950  ? CL 107 09/02/2021 0835  ? CL 106 11/28/2016 0908  ? CO2 24 09/02/2021 0835  ? CO2 26 11/28/2016 0908  ? CO2 24 10/22/2015 0950  ? BUN 15 09/02/2021 0835  ? BUN 27 10/03/2019 1423  ? BUN 12 11/28/2016 0908  ? BUN 14.0 10/22/2015 0950  ? CREATININE 1.55 (H) 09/02/2021 0835  ? CREATININE 1.6 (H) 11/28/2016 0908  ? CREATININE 1.6 (H) 10/22/2015 0950  ?    ?Component Value Date/Time  ? CALCIUM 10.2 09/02/2021 0835  ? CALCIUM 9.6 11/28/2016 0908  ? CALCIUM 9.4 10/22/2015 0950  ? ALKPHOS 73 09/02/2021 0835  ? ALKPHOS 89 (H) 11/28/2016 0908  ? ALKPHOS 79  10/22/2015 0950  ? AST 10 (L) 09/02/2021 0835  ? AST 35 (H) 10/22/2015 0950  ? ALT 10 09/02/2021 0835  ? ALT 40 11/28/2016 0908  ? ALT 41 10/22/2015 0950  ? BILITOT 0.6 09/02/2021 0835  ?  BILITOT 0.30 10/22/2015 0950  ?  ? ? ?Impression and Plan: ?[name] is a very nice 76 year old Afro-American female.  [name] initially was seen by me for her myeloma.  [name] is cured of this in my opinion.  [name] had a transplant 17 years ago.  So far, there is no evidence of recurrence. ? ?Breast cancer is also looking pretty good.  I do not see any evidence of recurrence of her breast cancer. ? ?I just feel bad that [name] has had these neurological issues from this accident.  Hopefully, moving in with her daughter will help her out. ? ?We will plan to get her back to see [place] in about 6 months. ? ?I think [name] like to have a CT scan done just to make sure nothing is going on with respect to the breast cancer.  I do not see a problem with this.  We will get this set up next week. ? ? ?[name], MD ?4/18/20239:59 AM  ?

## 2021-09-09 ENCOUNTER — Ambulatory Visit (HOSPITAL_COMMUNITY)
Admission: RE | Admit: 2021-09-09 | Discharge: 2021-09-09 | Disposition: A | Discharge status: Home / Self Care | Payer: Medicare Other | Source: Ambulatory Visit | Attending: Hematology & Oncology | Admitting: Hematology & Oncology

## 2021-09-09 ENCOUNTER — Other Ambulatory Visit: Payer: Self-pay | Admitting: Hematology & Oncology

## 2021-09-09 DIAGNOSIS — C50911 Malignant neoplasm of unspecified site of right female breast: Secondary | ICD-10-CM | POA: Diagnosis present

## 2021-09-09 DIAGNOSIS — N2889 Other specified disorders of kidney and ureter: Secondary | ICD-10-CM

## 2021-09-09 DIAGNOSIS — Z171 Estrogen receptor negative status [ER-]: Secondary | ICD-10-CM | POA: Diagnosis present

## 2021-09-09 MED ORDER — IOHEXOL 300 MG/ML  SOLN
80.0000 mL | Freq: Once | INTRAMUSCULAR | Status: AC | PRN
  Administered 2021-09-09: 80 mL via INTRAVENOUS

## 2021-09-12 ENCOUNTER — Telehealth: Payer: Self-pay

## 2021-09-12 NOTE — Telephone Encounter (Signed)
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  It might be a kidney cancer - we need to do an MRI to see if it cancer.  I will set this up!!   ? ?Laurey Arrow ?

## 2021-09-12 NOTE — Telephone Encounter (Signed)
Called and informed patient's daughter (ok per DPR) of results, daughter verbalized understanding and denies any questions or concerns at this time. Gave central scheduling's number to call and follow up on appointment. ? ?

## 2021-09-15 ENCOUNTER — Telehealth: Payer: Self-pay | Admitting: *Deleted

## 2021-09-15 NOTE — Telephone Encounter (Signed)
I called pt's daughter. No answer, left a message asking pt's daughter to call me back. ? ?

## 2021-09-15 NOTE — Telephone Encounter (Signed)
Pt daughter called about FMLA paper work  question  # 5 on page 3. Please call [phone]  ?

## 2021-09-19 ENCOUNTER — Ambulatory Visit (HOSPITAL_COMMUNITY)
Admission: RE | Admit: 2021-09-19 | Discharge: 2021-09-19 | Disposition: A | Discharge status: Home / Self Care | Payer: Medicare Other | Source: Ambulatory Visit | Attending: Hematology & Oncology | Admitting: Hematology & Oncology

## 2021-09-19 ENCOUNTER — Other Ambulatory Visit: Payer: Self-pay | Admitting: Hematology & Oncology

## 2021-09-19 ENCOUNTER — Telehealth: Payer: Self-pay

## 2021-09-19 ENCOUNTER — Encounter: Payer: Self-pay | Admitting: Hematology & Oncology

## 2021-09-19 DIAGNOSIS — N2889 Other specified disorders of kidney and ureter: Secondary | ICD-10-CM

## 2021-09-19 MED ORDER — GADOBUTROL 1 MMOL/ML IV SOLN
7.0000 mL | Freq: Once | INTRAVENOUS | Status: AC | PRN
  Administered 2021-09-19: 7 mL via INTRAVENOUS

## 2021-09-19 NOTE — Telephone Encounter (Signed)
Called and informed pts daughter of results and informed her Dr.Ennever will speak with Dr.Les Alinda Money at [facility] and see when they can get her in. Informed her we will update her once he talks with him. She verbalized understanding and denies any other questions or concerns at this time.  ?

## 2021-09-19 NOTE — Telephone Encounter (Signed)
-----   Message from [name], MD sent at 09/19/2021 12:06 PM EDT ----- ?Please call and let her know that the MRI does show that there is a tumor off the left kidney.  We have to refer her to urology.  Please call Dr. Dutch Gray so I can talk to him. ? ?[name] ?

## 2021-09-20 ENCOUNTER — Other Ambulatory Visit: Payer: Self-pay | Admitting: *Deleted

## 2021-09-20 ENCOUNTER — Telehealth: Payer: Self-pay | Admitting: *Deleted

## 2021-09-20 MED ORDER — TRAMADOL HCL 50 MG PO TABS: 50.0000 mg | ORAL_TABLET | Freq: Three times a day (TID) | ORAL | 0 refills | Status: DC | PRN

## 2021-09-20 NOTE — Telephone Encounter (Signed)
Call received from patient's daughter [name] stating that pt is having increased pain to left flank and would like to know if pt can have something stronger than Tylenol for pain.  Dr. Marin Olp notified. Order placed for pt to get Tramadol 50 mg every 8 hrs as needed for pain.  [name] notified of new prescription and is appreciative of assistance. Tramadol prescription sent to the [facility] on [address] in [place] per Angela's request. ? ? ?

## 2021-09-20 NOTE — Telephone Encounter (Addendum)
I have faxed [employer] to call me concerning the FMLA for daughter of pt.  Received confirmation that they received my fax to call me regarding FMLA p/w. ?

## 2021-09-21 ENCOUNTER — Other Ambulatory Visit: Payer: Self-pay | Admitting: Hematology & Oncology

## 2021-09-21 ENCOUNTER — Telehealth: Payer: Self-pay | Admitting: *Deleted

## 2021-09-21 DIAGNOSIS — R300 Dysuria: Secondary | ICD-10-CM

## 2021-09-21 NOTE — Telephone Encounter (Signed)
Message received from patient's daughter [name] stating that she received a call from Dr. Lynne Logan office and that appt is scheduled for 10/25/21 with Dr. Alinda Money which she states is not soon enough and that she would like a call back from Dr. Marin Olp to discuss her mother's diagnosis more with her.  Call placed to Dr. Lynne Logan office to see if appt can be moved sooner per request of Dr. Marin Olp. Call placed back to [name] to notify her that Dr. Lynne Logan office has been contacted and that Dr. Marin Olp will call her back this evening.  [name] is appreciative of call back and has no further questions at this time.  ?

## 2021-09-21 NOTE — Telephone Encounter (Signed)
Faxed referral to Dr. Raynelle Bring @ [facility] @ [phone] ?

## 2021-09-21 NOTE — Telephone Encounter (Signed)
I called and spoke to [facility] to f/u up on FMLA for daughter of pt.  [phone].  She was not able to pull up any information on pt or daughter of pt FMLA.  I relayed faxed FMLA for to her at the 07-22-2021 [phone] but she did not have this.  I relayed I faxed to her 09-20-2021 1705 form plus note to have them call me about the from what is needed.  [phone]. I did not have ID , will take 5 days from when I faxed over 09-20-2021 to address.  We confirmed fax # and [phone] was correct.   ?

## 2021-09-22 ENCOUNTER — Emergency Department (HOSPITAL_COMMUNITY): Payer: Medicare Other

## 2021-09-22 ENCOUNTER — Other Ambulatory Visit: Payer: Self-pay

## 2021-09-22 ENCOUNTER — Inpatient Hospital Stay (HOSPITAL_COMMUNITY)
Admission: EM | Admit: 2021-09-22 | Discharge: 2021-09-29 | DRG: 064 | Disposition: A | Discharge status: Rehabilitation Facility | Payer: Medicare Other | Attending: Family Medicine | Admitting: Family Medicine

## 2021-09-22 ENCOUNTER — Encounter (HOSPITAL_COMMUNITY): Payer: Self-pay | Admitting: Emergency Medicine

## 2021-09-22 DIAGNOSIS — I10 Essential (primary) hypertension: Secondary | ICD-10-CM | POA: Diagnosis present

## 2021-09-22 DIAGNOSIS — R410 Disorientation, unspecified: Secondary | ICD-10-CM | POA: Diagnosis not present

## 2021-09-22 DIAGNOSIS — I63512 Cerebral infarction due to unspecified occlusion or stenosis of left middle cerebral artery: Secondary | ICD-10-CM | POA: Diagnosis present

## 2021-09-22 DIAGNOSIS — G3184 Mild cognitive impairment, so stated: Secondary | ICD-10-CM | POA: Diagnosis present

## 2021-09-22 DIAGNOSIS — Z808 Family history of malignant neoplasm of other organs or systems: Secondary | ICD-10-CM

## 2021-09-22 DIAGNOSIS — Z85820 Personal history of malignant melanoma of skin: Secondary | ICD-10-CM

## 2021-09-22 DIAGNOSIS — E538 Deficiency of other specified B group vitamins: Secondary | ICD-10-CM

## 2021-09-22 DIAGNOSIS — F32A Depression, unspecified: Secondary | ICD-10-CM | POA: Diagnosis present

## 2021-09-22 DIAGNOSIS — K2100 Gastro-esophageal reflux disease with esophagitis, without bleeding: Secondary | ICD-10-CM | POA: Diagnosis present

## 2021-09-22 DIAGNOSIS — E1122 Type 2 diabetes mellitus with diabetic chronic kidney disease: Secondary | ICD-10-CM | POA: Diagnosis present

## 2021-09-22 DIAGNOSIS — B9620 Unspecified Escherichia coli [E. coli] as the cause of diseases classified elsewhere: Secondary | ICD-10-CM | POA: Diagnosis present

## 2021-09-22 DIAGNOSIS — C9000 Multiple myeloma not having achieved remission: Secondary | ICD-10-CM | POA: Diagnosis present

## 2021-09-22 DIAGNOSIS — Z888 Allergy status to other drugs, medicaments and biological substances status: Secondary | ICD-10-CM

## 2021-09-22 DIAGNOSIS — Z801 Family history of malignant neoplasm of trachea, bronchus and lung: Secondary | ICD-10-CM

## 2021-09-22 DIAGNOSIS — Z7982 Long term (current) use of aspirin: Secondary | ICD-10-CM

## 2021-09-22 DIAGNOSIS — I63532 Cerebral infarction due to unspecified occlusion or stenosis of left posterior cerebral artery: Secondary | ICD-10-CM | POA: Diagnosis not present

## 2021-09-22 DIAGNOSIS — G9341 Metabolic encephalopathy: Secondary | ICD-10-CM | POA: Diagnosis present

## 2021-09-22 DIAGNOSIS — Z923 Personal history of irradiation: Secondary | ICD-10-CM

## 2021-09-22 DIAGNOSIS — N183 Chronic kidney disease, stage 3 unspecified: Secondary | ICD-10-CM | POA: Diagnosis present

## 2021-09-22 DIAGNOSIS — E785 Hyperlipidemia, unspecified: Secondary | ICD-10-CM | POA: Diagnosis present

## 2021-09-22 DIAGNOSIS — R262 Difficulty in walking, not elsewhere classified: Secondary | ICD-10-CM | POA: Diagnosis present

## 2021-09-22 DIAGNOSIS — E872 Acidosis, unspecified: Secondary | ICD-10-CM

## 2021-09-22 DIAGNOSIS — Z8249 Family history of ischemic heart disease and other diseases of the circulatory system: Secondary | ICD-10-CM

## 2021-09-22 DIAGNOSIS — Z9221 Personal history of antineoplastic chemotherapy: Secondary | ICD-10-CM

## 2021-09-22 DIAGNOSIS — F0393 Unspecified dementia, unspecified severity, with mood disturbance: Secondary | ICD-10-CM | POA: Diagnosis present

## 2021-09-22 DIAGNOSIS — I672 Cerebral atherosclerosis: Secondary | ICD-10-CM | POA: Diagnosis present

## 2021-09-22 DIAGNOSIS — H518 Other specified disorders of binocular movement: Secondary | ICD-10-CM | POA: Diagnosis present

## 2021-09-22 DIAGNOSIS — G928 Other toxic encephalopathy: Secondary | ICD-10-CM | POA: Diagnosis present

## 2021-09-22 DIAGNOSIS — N3000 Acute cystitis without hematuria: Secondary | ICD-10-CM | POA: Diagnosis not present

## 2021-09-22 DIAGNOSIS — Z20822 Contact with and (suspected) exposure to covid-19: Secondary | ICD-10-CM | POA: Diagnosis present

## 2021-09-22 DIAGNOSIS — I639 Cerebral infarction, unspecified: Secondary | ICD-10-CM

## 2021-09-22 DIAGNOSIS — Z87820 Personal history of traumatic brain injury: Secondary | ICD-10-CM

## 2021-09-22 DIAGNOSIS — N1832 Chronic kidney disease, stage 3b: Secondary | ICD-10-CM | POA: Diagnosis present

## 2021-09-22 DIAGNOSIS — R4701 Aphasia: Secondary | ICD-10-CM | POA: Diagnosis present

## 2021-09-22 DIAGNOSIS — Z853 Personal history of malignant neoplasm of breast: Secondary | ICD-10-CM

## 2021-09-22 DIAGNOSIS — I69320 Aphasia following cerebral infarction: Secondary | ICD-10-CM

## 2021-09-22 DIAGNOSIS — G9349 Other encephalopathy: Secondary | ICD-10-CM | POA: Diagnosis present

## 2021-09-22 DIAGNOSIS — Z79899 Other long term (current) drug therapy: Secondary | ICD-10-CM

## 2021-09-22 DIAGNOSIS — N2889 Other specified disorders of kidney and ureter: Secondary | ICD-10-CM | POA: Diagnosis present

## 2021-09-22 DIAGNOSIS — Z833 Family history of diabetes mellitus: Secondary | ICD-10-CM

## 2021-09-22 DIAGNOSIS — N390 Urinary tract infection, site not specified: Secondary | ICD-10-CM | POA: Diagnosis present

## 2021-09-22 DIAGNOSIS — R414 Neurologic neglect syndrome: Secondary | ICD-10-CM | POA: Diagnosis present

## 2021-09-22 DIAGNOSIS — Z8673 Personal history of transient ischemic attack (TIA), and cerebral infarction without residual deficits: Secondary | ICD-10-CM

## 2021-09-22 DIAGNOSIS — E559 Vitamin D deficiency, unspecified: Secondary | ICD-10-CM | POA: Diagnosis present

## 2021-09-22 DIAGNOSIS — E876 Hypokalemia: Secondary | ICD-10-CM

## 2021-09-22 DIAGNOSIS — R29705 NIHSS score 5: Secondary | ICD-10-CM | POA: Diagnosis present

## 2021-09-22 DIAGNOSIS — R4781 Slurred speech: Secondary | ICD-10-CM | POA: Diagnosis present

## 2021-09-22 DIAGNOSIS — Z800 Family history of malignant neoplasm of digestive organs: Secondary | ICD-10-CM

## 2021-09-22 DIAGNOSIS — I129 Hypertensive chronic kidney disease with stage 1 through stage 4 chronic kidney disease, or unspecified chronic kidney disease: Secondary | ICD-10-CM | POA: Diagnosis present

## 2021-09-22 DIAGNOSIS — Z96659 Presence of unspecified artificial knee joint: Secondary | ICD-10-CM | POA: Diagnosis present

## 2021-09-22 LAB — COMPREHENSIVE METABOLIC PANEL
ALT: 11 U/L (ref 0–44)
AST: 10 U/L — ABNORMAL LOW (ref 15–41)
Albumin: 4 g/dL (ref 3.5–5.0)
Alkaline Phosphatase: 71 U/L (ref 38–126)
Anion gap: 7 (ref 5–15)
BUN: 14 mg/dL (ref 8–23)
CO2: 25 mmol/L (ref 22–32)
Calcium: 9.8 mg/dL (ref 8.9–10.3)
Chloride: 111 mmol/L (ref 98–111)
Creatinine, Ser: 1.57 mg/dL — ABNORMAL HIGH (ref 0.44–1.00)
GFR, Estimated: 35 mL/min — ABNORMAL LOW (ref >60)
Glucose, Bld: 106 mg/dL — ABNORMAL HIGH (ref 70–99)
Potassium: 4.4 mmol/L (ref 3.5–5.1)
Sodium: 143 mmol/L (ref 135–145)
Total Bilirubin: 0.6 mg/dL (ref 0.3–1.2)
Total Protein: 8 g/dL (ref 6.5–8.1)

## 2021-09-22 LAB — URINALYSIS, ROUTINE W REFLEX MICROSCOPIC
Bilirubin Urine: NEGATIVE
Glucose, UA: NEGATIVE mg/dL
Hgb urine dipstick: NEGATIVE
Ketones, ur: NEGATIVE mg/dL
Nitrite: POSITIVE — AB
Protein, ur: 100 mg/dL — AB
Specific Gravity, Urine: 1.03 (ref 1.005–1.030)
pH: 6 (ref 5.0–8.0)

## 2021-09-22 LAB — CBC
HCT: 40.8 % (ref 36.0–46.0)
Hemoglobin: 12.9 g/dL (ref 12.0–15.0)
MCH: 31.2 pg (ref 26.0–34.0)
MCHC: 31.6 g/dL (ref 30.0–36.0)
MCV: 98.6 fL (ref 80.0–100.0)
Platelets: 280 10*3/uL (ref 150–400)
RBC: 4.14 MIL/uL (ref 3.87–5.11)
RDW: 13.4 % (ref 11.5–15.5)
WBC: 7 10*3/uL (ref 4.0–10.5)
nRBC: 0 % (ref 0.0–0.2)

## 2021-09-22 LAB — I-STAT CHEM 8, ED
BUN: 12 mg/dL (ref 8–23)
Calcium, Ion: 1.28 mmol/L (ref 1.15–1.40)
Chloride: 109 mmol/L (ref 98–111)
Creatinine, Ser: 1.5 mg/dL — ABNORMAL HIGH (ref 0.44–1.00)
Glucose, Bld: 97 mg/dL (ref 70–99)
HCT: 40 % (ref 36.0–46.0)
Hemoglobin: 13.6 g/dL (ref 12.0–15.0)
Potassium: 4.3 mmol/L (ref 3.5–5.1)
Sodium: 144 mmol/L (ref 135–145)
TCO2: 26 mmol/L (ref 22–32)

## 2021-09-22 LAB — DIFFERENTIAL
Abs Immature Granulocytes: 0.03 10*3/uL (ref 0.00–0.07)
Basophils Absolute: 0 10*3/uL (ref 0.0–0.1)
Basophils Relative: 1 %
Eosinophils Absolute: 0.1 10*3/uL (ref 0.0–0.5)
Eosinophils Relative: 1 %
Immature Granulocytes: 0 %
Lymphocytes Relative: 18 %
Lymphs Abs: 1.3 10*3/uL (ref 0.7–4.0)
Monocytes Absolute: 0.6 10*3/uL (ref 0.1–1.0)
Monocytes Relative: 9 %
Neutro Abs: 4.9 10*3/uL (ref 1.7–7.7)
Neutrophils Relative %: 71 %

## 2021-09-22 LAB — RAPID URINE DRUG SCREEN, HOSP PERFORMED
Amphetamines: NOT DETECTED
Barbiturates: NOT DETECTED
Benzodiazepines: NOT DETECTED
Cocaine: NOT DETECTED
Opiates: NOT DETECTED
Tetrahydrocannabinol: NOT DETECTED

## 2021-09-22 LAB — ETHANOL: Alcohol, Ethyl (B): <10 mg/dL (ref <10)

## 2021-09-22 LAB — RESP PANEL BY RT-PCR (FLU A&B, COVID) ARPGX2
Influenza A by PCR: NEGATIVE
Influenza B by PCR: NEGATIVE
SARS Coronavirus 2 by RT PCR: NEGATIVE

## 2021-09-22 LAB — PROTIME-INR
INR: 1.1 (ref 0.8–1.2)
Prothrombin Time: 13.8 seconds (ref 11.4–15.2)

## 2021-09-22 LAB — CBG MONITORING, ED: Glucose-Capillary: 90 mg/dL (ref 70–99)

## 2021-09-22 LAB — AMMONIA: Ammonia: <10 umol/L (ref 9–35)

## 2021-09-22 LAB — APTT: aPTT: 37 seconds — ABNORMAL HIGH (ref 24–36)

## 2021-09-22 MED ORDER — DONEPEZIL HCL 10 MG PO TABS
5.0000 mg | ORAL_TABLET | Freq: Every day | ORAL | Status: DC
  Administered 2021-09-23 – 2021-09-28 (×5): 5 mg via ORAL
  Filled 2021-09-22 (×6): qty 1

## 2021-09-22 MED ORDER — AMLODIPINE BESYLATE 10 MG PO TABS
10.0000 mg | ORAL_TABLET | Freq: Every day | ORAL | Status: DC
  Administered 2021-09-23 – 2021-09-29 (×7): 10 mg via ORAL
  Filled 2021-09-22 (×7): qty 1

## 2021-09-22 MED ORDER — TRAMADOL HCL 50 MG PO TABS: 25.0000 mg | ORAL_TABLET | Freq: Four times a day (QID) | ORAL | Status: DC | PRN

## 2021-09-22 MED ORDER — IOHEXOL 350 MG/ML SOLN
100.0000 mL | Freq: Once | INTRAVENOUS | Status: AC | PRN
  Administered 2021-09-22: 100 mL via INTRAVENOUS

## 2021-09-22 MED ORDER — ATENOLOL 100 MG PO TABS
100.0000 mg | ORAL_TABLET | Freq: Once | ORAL | Status: DC
Start: 2021-09-22 — End: 2021-09-22
  Filled 2021-09-22: qty 1

## 2021-09-22 MED ORDER — ONDANSETRON HCL 4 MG PO TABS: 4.0000 mg | ORAL_TABLET | Freq: Four times a day (QID) | ORAL | Status: DC | PRN

## 2021-09-22 MED ORDER — SODIUM CHLORIDE 0.45 % IV SOLN: INTRAVENOUS | Status: AC

## 2021-09-22 MED ORDER — PANTOPRAZOLE SODIUM 40 MG PO TBEC
40.0000 mg | DELAYED_RELEASE_TABLET | Freq: Every day | ORAL | Status: DC
  Administered 2021-09-23 – 2021-09-29 (×6): 40 mg via ORAL
  Filled 2021-09-22 (×7): qty 1

## 2021-09-22 MED ORDER — SODIUM CHLORIDE 0.9 % IV SOLN
1.0000 g | Freq: Once | INTRAVENOUS | Status: AC
  Administered 2021-09-22: 1 g via INTRAVENOUS
  Filled 2021-09-22: qty 10

## 2021-09-22 MED ORDER — ROSUVASTATIN CALCIUM 5 MG PO TABS
10.0000 mg | ORAL_TABLET | Freq: Every day | ORAL | Status: DC
  Administered 2021-09-23 – 2021-09-26 (×4): 10 mg via ORAL
  Filled 2021-09-22: qty 1
  Filled 2021-09-22 (×2): qty 2
  Filled 2021-09-22: qty 1

## 2021-09-22 MED ORDER — MEMANTINE HCL 10 MG PO TABS
5.0000 mg | ORAL_TABLET | Freq: Every day | ORAL | Status: DC
  Administered 2021-09-23 – 2021-09-29 (×7): 5 mg via ORAL
  Filled 2021-09-22 (×7): qty 1

## 2021-09-22 MED ORDER — SODIUM CHLORIDE 0.9 % IV BOLUS
1000.0000 mL | Freq: Once | INTRAVENOUS | Status: AC
  Administered 2021-09-22: 1000 mL via INTRAVENOUS

## 2021-09-22 MED ORDER — ATENOLOL 25 MG PO TABS
100.0000 mg | ORAL_TABLET | Freq: Every day | ORAL | Status: DC
Start: 2021-09-22 — End: 2021-09-29
  Administered 2021-09-23 – 2021-09-29 (×7): 100 mg via ORAL
  Filled 2021-09-22: qty 4
  Filled 2021-09-22 (×2): qty 1
  Filled 2021-09-22 (×4): qty 4

## 2021-09-22 MED ORDER — SODIUM CHLORIDE 0.9 % IV SOLN
1.0000 g | INTRAVENOUS | Status: DC
  Administered 2021-09-23: 1 g via INTRAVENOUS
  Filled 2021-09-22 (×2): qty 10

## 2021-09-22 MED ORDER — DOXYLAMINE SUCCINATE (SLEEP) 25 MG PO TABS
25.0000 mg | ORAL_TABLET | Freq: Every evening | ORAL | Status: DC | PRN
  Administered 2021-09-23: 25 mg via ORAL
  Filled 2021-09-22 (×2): qty 1

## 2021-09-22 MED ORDER — MEGESTROL ACETATE 40 MG PO TABS
40.0000 mg | ORAL_TABLET | Freq: Every day | ORAL | Status: DC
  Administered 2021-09-23 – 2021-09-29 (×7): 40 mg via ORAL
  Filled 2021-09-22 (×7): qty 1

## 2021-09-22 MED ORDER — ENOXAPARIN SODIUM 40 MG/0.4ML IJ SOSY
40.0000 mg | PREFILLED_SYRINGE | INTRAMUSCULAR | Status: DC
  Administered 2021-09-22 – 2021-09-23 (×2): 40 mg via SUBCUTANEOUS
  Filled 2021-09-22 (×2): qty 0.4

## 2021-09-22 MED ORDER — ONDANSETRON HCL 4 MG/2ML IJ SOLN: 4.0000 mg | Freq: Four times a day (QID) | INTRAMUSCULAR | Status: DC | PRN

## 2021-09-22 MED ORDER — ASPIRIN EC 81 MG PO TBEC
81.0000 mg | DELAYED_RELEASE_TABLET | Freq: Every day | ORAL | Status: DC
  Administered 2021-09-23 – 2021-09-29 (×6): 81 mg via ORAL
  Filled 2021-09-22 (×7): qty 1

## 2021-09-22 MED ORDER — ACETAMINOPHEN 650 MG RE SUPP: 650.0000 mg | Freq: Four times a day (QID) | RECTAL | Status: DC | PRN

## 2021-09-22 MED ORDER — ACETAMINOPHEN 325 MG PO TABS
650.0000 mg | ORAL_TABLET | Freq: Four times a day (QID) | ORAL | Status: DC | PRN
  Administered 2021-09-24: 650 mg via ORAL
  Filled 2021-09-22: qty 2

## 2021-09-22 MED ORDER — HYDRALAZINE HCL 20 MG/ML IJ SOLN
10.0000 mg | Freq: Once | INTRAMUSCULAR | Status: AC
  Administered 2021-09-22: 10 mg via INTRAVENOUS
  Filled 2021-09-22: qty 1

## 2021-09-22 NOTE — ED Notes (Signed)
Pt vitals are overdue because pt is in CT ?

## 2021-09-22 NOTE — Progress Notes (Signed)
Code Stroke activated at [phone] ?Dr. Rory Percy paged at [place]- patient being transported to [place].  ?Dr. Rory Percy on camera on cart at [phone]- updated on PMH- significant for cancer- most recently renal cancer, HTN, old strokes. LSN at [phone]- needs help with managing daily care, cannot be left home alone for extended periods of time.  ?

## 2021-09-22 NOTE — Telephone Encounter (Signed)
Signed and faxed back to [place] [phone] fax confirmation received.  ?

## 2021-09-22 NOTE — Progress Notes (Signed)
? ?  Patient Details ?Name: [name] ?MRN: [id number] ?DOB: December 11, 1945 ? ? ?Cancelled treatment:       Reason Eval/Treat Not Completed:  (Order for cognitive evaluation received, will see as soon as schedule allows.  Thanks.) ? ?Kathleen Lime, MS CCC SLP ?Acute Rehab Services ?Office [phone] ?Pager [phone] ? ?[name] ?09/22/2021, 3:20 PM ? ? ?

## 2021-09-22 NOTE — Consult Note (Addendum)
[facility] Telemedicine Consult ? ? ?Requesting Provider: Jerilynn Mages. Trifan MD ?Consult Participants: Dr. Jerelyn Charles, Telespecialist RN Verdis Frederickson   bedside RN Ria Comment ?Location of the provider: [place] ?Location of the patient: [facility] bed [facility] ? ?This consult was provided via telemedicine with 2-way video and audio communication. The patient/family was informed that care would be provided in this way and agreed to receive care in this manner.  ? ? ?Chief Complaint: Confusion, speech difficulty, difficulty walking ? ?HPI: 76 year old with past medical history of breast cancer, multiple myeloma, recent diagnosis of renal cell cancer, diabetes, hypertension, hyperlipidemia, osteoarthritis, head injury at work with a heavy piece of machinery falling on her head requiring multiple sutures and ensuing cognitive decline since 2021, dementia, presented to the emergency room with a last known well time of 6:30 PM last night with complaints as below. ?The daughter says that she fed her dinner, and the patient slept but upon waking up this morning appeared more confused.  She was not able to name her daughter or recognize her daughter.  She was having a hard time walking with questionably dragging the left leg.  This was not normal for her and she was driven by the family to the ER.  A code stroke was activated due to the above symptoms. ?Daughter reports that the patient recently moved in with her so that the daughter could take care of her.  Patient has been requiring more and more help on daily tasks and her doctor did not feel that it was safe for her to be living by herself, so she had a movement. ?At baseline, she uses a cane to walk.  She is independent with feeding and toileting but requires help with accounting and managing her affairs for the past 2 years. ? ?Daughter reports that the patient has been having very strong foul-smelling urine for the past few days.  They reached out to the outpatient doctors who  had recommended a UA which was supposed to be done today. ? ? ? ?Past Medical History:  ?Diagnosis Date  ? Anemia   ? Arthralgia   ? 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 Breast cancer ([place])   ? Breast cancer, stage 1 ([place]) 09/09/2015  ? Chronic pain of left knee   ? Degenerative joint disease involving multiple joints 03/29/2011  ? Diabetes mellitus without complication ([place])   ? Patient reports she hasnt had Diabetes in years  ? DJD (degenerative joint disease)   ? E. coli gastroenteritis   ? GERD (gastroesophageal reflux disease)   ? Herpes simplex esophagitis 2006  ? History of blood transfusion   ? with first pregnancy  ? History of multiple myeloma   ? chemo XRT, Dr. Marin Olp  ? History of radiation therapy 03/16/16-05/02/16  ? right breast 50.4 Gy in 28 fractions, boost to 60.4 Gy in 5 fractions  ? 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 HTN (hypertension) 2005  ? Hyperlipidemia   ? Myeloma ([place]) 03/28/2011  ? OA (osteoarthritis)   ? Paget's bone disease   ? Personal history of adenomatous colonic polyps 11/02/2011  ? 2 diminutive serrated adenomas 10/2011 Repeat colonoscopy 2018 (change from original recall based upon current guidelines 12/07/2014)    ? PONV (postoperative nausea and vomiting)   ? Radiation 09/28/2006 - 10/11/2006  ? skull treated to 1980 cGy by Dr. Elba Barman  ? Renal insufficiency   ? H/O resolved  ?  Scalp laceration 06/05/2019  ? Stroke [facility])   ? Vitamin D deficiency   ? ? ? ?Current Facility-Administered Medications:  ?  atenolol (TENORMIN) tablet 100 mg, 100 mg, Oral, Once, Trifan, Carola Rhine, MD ? ?Current Outpatient Medications:  ?  acetaminophen (TYLENOL) 325 MG tablet, Take 650 mg by mouth every 6 (six) hours as needed., Disp: , Rfl:  ?  amLODipine (NORVASC) 10 MG tablet, Take 1 tablet (10 mg total) by mouth daily. Patient needs appointment for  further refills, Disp: 30 tablet, Rfl: 0 ?  aspirin EC 81 MG tablet, Take 81  mg by mouth daily., Disp: , Rfl:  ?  atenolol (TENORMIN) 100 MG tablet, TAKE 1 TABLET BY MOUTH EVERY DAY, Disp: 90 tablet, Rfl: 1 ?  donepezil (ARICEPT) 5 MG tablet, Take 1 tablet (5 mg total) by mouth at bedtime., Disp: 30 tablet, Rfl: 5 ?  doxylamine, Sleep, (UNISOM) 25 MG tablet, Take 25 mg by mouth at bedtime as needed for sleep. , Disp: , Rfl:  ?  megestrol (MEGACE) 40 MG tablet, Take 40 mg by mouth daily., Disp: , Rfl:  ?  memantine (NAMENDA) 5 MG tablet, TAKE 1 TAB AT BEDTIME FOR 1 WEEK THEN INCREASE TO 1 TAB TWICE DAILY THEREAFTER, Disp: 180 tablet, Rfl: 2 ?  omeprazole (PRILOSEC) 40 MG capsule, Take 40 mg by mouth daily., Disp: , Rfl:  ?  rosuvastatin (CRESTOR) 20 MG tablet, TAKE 1/2 TABLET(10 MG) BY MOUTH DAILY. NEED APPT FOR FURTHER REFILLS, Disp: 30 tablet, Rfl: 0 ?  traMADol (ULTRAM) 50 MG tablet, Take 1 tablet (50 mg total) by mouth every 8 (eight) hours as needed., Disp: 30 tablet, Rfl: 0 ? ? ? ?LKW: 1830 hrs. on 09/21/2021 ?tpa given?: No, outside the window ?IR Thrombectomy? No, poor modified Rankin as well as distal vessel with arteriopathy ?Modified Rankin Scale: 3-Moderate disability-requires help but walks WITHOUT assistance realistically somewhere between 3 and 4 ?Time of teleneurologist evaluation: 0842 hrs. ? ?Exam: ?Vitals:  ? 09/22/21 0834 09/22/21 0930  ?BP: (!) 203/86 (!) 145/101  ?Pulse: (!) 56 64  ?Resp: 17 13  ?Temp: 98 ?F (36.7 ?C)   ?SpO2: 100% 99%  ?General: Awake, appears confused ?Neurological exam ?She is awake, alert, appears somewhat confused when told that I would be seeing her through the camera. ?Unable to name simple objects on the stroke chart ?Unable to tell her name, date, or orientation questions ?Unable to follow simple commands ?Cranial nerves: Pupils are equal round react light, extraocular movements are intact, difficult to assess visual fields given her confusion, face appears symmetric. ?Motor examination with no drift in any of the extremities ?Sensation intact to  touch ?Coordination difficult to perform given mentation but no gross dysmetria ?NIHSS ?1A: Level of Consciousness - 0 ?1B: Ask Month and Age - 2 ?1C: 'Blink Eyes' & 'Squeeze Hands' - 2 ?2: Test Horizontal Extraocular Movements - 0 ?3: Test Visual Fields - 0 ?4: Test Facial Palsy - 0 ?5A: Test Left Arm Motor Drift - 0 ?5B: Test Right Arm Motor Drift - 0 ?6A: Test Left Leg Motor Drift - 0 ?6B: Test Right Leg Motor Drift - 0 ?7: Test Limb Ataxia - 0 ?8: Test Sensation - 0 ?9: Test Language/Aphasia- 1 ?10: Test Dysarthria - 0 ?11: Test Extinction/Inattention - 0 ?NIHSS score: 5 ? ? ?Imaging Reviewed: CT head no acute changes.  CT angio head and neck with occlusion versus severe stenosis of the proximal left P2 PCA with poor distal opacification.  Rapid  with 22 cc penumbra in the left PCA territory without evidence of core infarct.  Severe right P2 PCA stenosis.  Moderate bilateral M2 MCA branch stenosis and right A2 ACA stenosis.  Bilateral carotid bifurcation atherosclerosis without greater than 50% stenosis. ? ?Labs reviewed in epic and pertinent values follow: ?CBC ?   ?Component Value Date/Time  ? WBC 7.0 09/22/2021 0900  ? RBC 4.14 09/22/2021 0900  ? HGB 13.6 09/22/2021 0910  ? HGB 12.9 09/02/2021 0835  ? HGB 12.3 11/28/2016 0908  ? HGB 12.0 10/07/2007 0834  ? HCT 40.0 09/22/2021 0910  ? HCT 37.3 11/28/2016 0908  ? HCT 36.0 10/07/2007 0834  ? PLT 280 09/22/2021 0900  ? PLT 290 09/02/2021 0835  ? PLT 254 11/28/2016 0908  ? PLT 268 10/07/2007 0834  ? MCV 98.6 09/22/2021 0900  ? MCV 99 11/28/2016 0908  ? MCV 92.7 10/07/2007 0834  ? MCH 31.2 09/22/2021 0900  ? MCHC 31.6 09/22/2021 0900  ? RDW 13.4 09/22/2021 0900  ? RDW 13.6 11/28/2016 0908  ? RDW 13.5 10/07/2007 0834  ? LYMPHSABS 1.3 09/22/2021 0900  ? LYMPHSABS 1.6 11/28/2016 0908  ? LYMPHSABS 1.3 10/07/2007 0834  ? MONOABS 0.6 09/22/2021 0900  ? MONOABS 0.6 10/07/2007 0834  ? EOSABS 0.1 09/22/2021 0900  ? EOSABS 0.1 11/28/2016 0908  ? BASOSABS 0.0 09/22/2021 0900   ? BASOSABS 0.0 11/28/2016 0908  ? BASOSABS 0.0 10/07/2007 0834  ? ?CMP  ?   ?Component Value Date/Time  ? NA 144 09/22/2021 0910  ? NA 145 (H) 10/03/2019 1423  ? NA 141 11/28/2016 0908  ? NA 142 06/0

## 2021-09-22 NOTE — ED Provider Notes (Signed)
?[facility] DEPT ?Provider Note ? ? ?CSN: [id number] ?Arrival date & time: 09/22/21  0827 ? ?An emergency department physician performed an initial assessment on this suspected stroke patient at [phone]. ? ?History ? ?Chief Complaint  ?Patient presents with  ? Aphasia  ? Code Stroke  ? ? ?[name] [place] is a 76 y.o. female w/hx of HTN, HLD, TIA, recurring UTI, renal cell mass, presenting to the ED with confusion, garbled speech.  [place] 6:30 pm per her daughter's report.  Daughter states that the patient lives with her, that she was last seen normal yesterday, daughter went to wake her up today and this patient seems suddenly and acutely confused.  Her daughter reports the patient had garbled speech, seemed to be having difficulty walking, and was way of her baseline.  Normally she is lucid and clear when she speaks, though she does have some ambulatory difficulties.  Daughter reports patient does have a history of TIA, she is not on anticoagulation.  The patient also with a history of recurring UTIs and she has had cloudy and malodorous urine in the past few days. ? ?HPI ? ?  ? ?Home Medications ?Prior to Admission medications   ?Medication Sig Start Date End Date Taking? Authorizing Provider  ?acetaminophen (TYLENOL) 500 MG tablet Take 500 mg by mouth every 6 (six) hours as needed for mild pain.   Yes [provider]  ?amLODipine (NORVASC) 10 MG tablet Take 1 tablet (10 mg total) by mouth daily. Patient needs appointment for  further refills 05/06/21  Yes Adrian Prows, MD  ?aspirin EC 81 MG tablet Take 81 mg by mouth daily.   Yes [provider]  ?atenolol (TENORMIN) 100 MG tablet TAKE 1 TABLET BY MOUTH EVERY DAY ?Patient taking differently: Take 100 mg by mouth daily. 09/20/20  Yes Adrian Prows, MD  ?donepezil (ARICEPT) 5 MG tablet Take 1 tablet (5 mg total) by mouth at bedtime. 06/02/21  Yes Ward Givens, NP  ?doxylamine, Sleep, (UNISOM) 25 MG tablet Take 25 mg by mouth at  bedtime as needed for sleep.    Yes [provider]  ?loperamide (IMODIUM) 2 MG capsule Take 2 mg by mouth daily as needed for diarrhea or loose stools.   Yes [provider]  ?megestrol (MEGACE) 40 MG tablet Take 40 mg by mouth daily. 09/24/20  Yes [provider]  ?memantine (NAMENDA) 5 MG tablet TAKE 1 TAB AT BEDTIME FOR 1 WEEK THEN INCREASE TO 1 TAB TWICE DAILY THEREAFTER ?Patient taking differently: Take 5 mg by mouth daily. 06/30/21  Yes Ward Givens, NP  ?omeprazole (PRILOSEC) 40 MG capsule Take 40 mg by mouth daily.   Yes [provider]  ?rosuvastatin (CRESTOR) 20 MG tablet TAKE 1/2 TABLET(10 MG) BY MOUTH DAILY. NEED APPT FOR FURTHER REFILLS ?Patient taking differently: Take 10 mg by mouth daily. 05/06/21  Yes Adrian Prows, MD  ?traMADol (ULTRAM) 50 MG tablet Take 1 tablet (50 mg total) by mouth every 8 (eight) hours as needed. ?Patient taking differently: Take 50 mg by mouth every 8 (eight) hours as needed for moderate pain. 09/20/21  Yes [name], MD  ?   ? ?Allergies    ?Ace inhibitors and Lisinopril   ? ?Review of Systems   ?Review of Systems ? ?Physical Exam ?Updated Vital Signs ?BP (!) 163/79 (BP Location: Right Arm)   Pulse 63   Temp 97.6 ?F (36.4 ?C) (Oral)   Resp 14   LMP 05/22/2000   SpO2  100%  ?Physical Exam ?Constitutional:   ?   General: She is not in acute distress. ?HENT:  ?   Head: Normocephalic and atraumatic.  ?Eyes:  ?   Conjunctiva/sclera: Conjunctivae normal.  ?   Pupils: Pupils are equal, round, and reactive to light.  ?Cardiovascular:  ?   Rate and Rhythm: Normal rate and regular rhythm.  ?Pulmonary:  ?   Effort: Pulmonary effort is normal. No respiratory distress.  ?Abdominal:  ?   General: There is no distension.  ?   Tenderness: There is no abdominal tenderness.  ?Skin: ?   General: Skin is warm and dry.  ?Neurological:  ?   Mental Status: She is alert.  ?   Comments: Garbled speech, patient not following commands, unable to assess  orientation due to confusion, she is moving all extremities spontaneously but appears weaker on the right upper and right lower extremity ?No evident facial droop ?  ? ? ?ED Results / Procedures / Treatments   ?Labs ?(all labs ordered are listed, but only abnormal results are displayed) ?Labs Reviewed  ?APTT - Abnormal; Notable for the following components:  ?    Result Value  ? aPTT 37 (*)   ? All other components within normal limits  ?COMPREHENSIVE METABOLIC PANEL - Abnormal; Notable for the following components:  ? Glucose, Bld 106 (*)   ? Creatinine, Ser 1.57 (*)   ? AST 10 (*)   ? GFR, Estimated 35 (*)   ? All other components within normal limits  ?URINALYSIS, ROUTINE W REFLEX MICROSCOPIC - Abnormal; Notable for the following components:  ? Protein, ur 100 (*)   ? Nitrite POSITIVE (*)   ? Leukocytes,Ua MODERATE (*)   ? Bacteria, UA MANY (*)   ? All other components within normal limits  ?BASIC METABOLIC PANEL - Abnormal; Notable for the following components:  ? CO2 19 (*)   ? Creatinine, Ser 1.26 (*)   ? Calcium 8.8 (*)   ? GFR, Estimated 45 (*)   ? All other components within normal limits  ?I-STAT CHEM 8, ED - Abnormal; Notable for the following components:  ? Creatinine, Ser 1.50 (*)   ? All other components within normal limits  ?RESP PANEL BY RT-PCR (FLU A&B, COVID) [id number]  ?URINE CULTURE  ?ETHANOL  ?PROTIME-INR  ?CBC  ?DIFFERENTIAL  ?RAPID URINE DRUG SCREEN, HOSP PERFORMED  ?AMMONIA  ?CBC  ?VITAMIN B12  ?CBG MONITORING, ED  ? ? ?EKG ?EKG Interpretation ? ?Date/Time:  Thursday Sep 22 2021 08:33:14 EDT ?Ventricular Rate:  57 ?PR Interval:  140 ?QRS Duration: 96 ?QT Interval:  439 ?QTC Calculation: 428 ?R Axis:   17 ?Text Interpretation: Sinus rhythm Probable left ventricular hypertrophy Confirmed by Octaviano Glow [phone]) on 09/22/2021 8:40:39 AM ? ?Radiology ?MR BRAIN WO CONTRAST ? ?Result Date: 09/22/2021 ?CLINICAL DATA:  Stroke, follow up EXAM: MRI HEAD WITHOUT CONTRAST TECHNIQUE: Multiplanar, multiecho  pulse sequences of the brain and surrounding structures were obtained without intravenous contrast. COMPARISON:  February 2021 FINDINGS: Significant motion artifact is present. Brain: There is no acute infarction or intracranial hemorrhage. There is no intracranial mass, mass effect, or edema. There is no hydrocephalus or extra-axial fluid collection. Patchy and confluent T2 hyperintensity in the supratentorial white matter is nonspecific but may reflect chronic microvascular ischemic changes. Prominence of the ventricles and sulci reflects parenchymal volume loss. Vascular: Major vessel flow voids at the skull base are preserved. Skull and upper cervical spine: Normal marrow signal is preserved. Sinuses/Orbits: Paranasal sinuses  are aerated. Orbits are unremarkable. Other: Sella is unremarkable.  Mastoid air cells are clear. IMPRESSION: Motion degraded.  No acute infarction. Electronically Signed   By: Macy Mis M.D.   On: 09/22/2021 10:51  ? ?CT HEAD CODE STROKE WO CONTRAST ? ?Result Date: 09/22/2021 ?CLINICAL DATA:  Code stroke. Neuro deficit, acute, stroke suspected garbled speech, possible right hemineglect EXAM: CT HEAD WITHOUT CONTRAST TECHNIQUE: Contiguous axial images were obtained from the base of the skull through the vertex without intravenous contrast. RADIATION DOSE REDUCTION: This exam was performed according to the departmental dose-optimization program which includes automated exposure control, adjustment of the mA and/or kV according to patient size and/or use of iterative reconstruction technique. COMPARISON:  None Available. FINDINGS: Brain: No evidence of acute large vascular territory infarction, hemorrhage, hydrocephalus, extra-axial collection or mass lesion/mass effect. Moderate to advanced patchy white matter hypoattenuation, nonspecific but compatible with chronic microvascular ischemic disease. Vascular: No hyperdense vessel identified. Calcific intracranial atherosclerosis. Skull: No  acute fracture. Sinuses/Orbits: Clear sinuses. Other: No mastoid effusions. ASPECTS [facility] Stroke Program Early CT Score) Total score (0-10 with 10 being normal): 10. IMPRESSION: 1. No evidence of acute large va

## 2021-09-22 NOTE — Progress Notes (Signed)
Dr. Rory Percy made aware of CTA results at [phone] see report, He was notified by Radiology as well.  Not a candidate for intervention due to occlusion too distal and mRS.  ?

## 2021-09-22 NOTE — ED Notes (Signed)
Unable to obtain IV. 2 attempts by RN Domingo Dimes. Ultrasound IV requested  ?

## 2021-09-22 NOTE — ED Notes (Signed)
Code stroke called by Langston Masker, MD  ?

## 2021-09-22 NOTE — ED Triage Notes (Signed)
Pt arrives with daughter w/ c/o confusion, aphasia, and difficulty walking. Hx TIA. LKN 6:30 PM.  ?

## 2021-09-22 NOTE — ED Notes (Signed)
Pt vitals are overdue because pt is in MRI ?

## 2021-09-22 NOTE — Plan of Care (Signed)
?  Problem: Clinical Measurements: ?Goal: Ability to maintain clinical measurements within normal limits will improve ?Outcome: Progressing ?Goal: Will remain free from infection ?Outcome: Progressing ?Goal: Diagnostic test results will improve ?Outcome: Progressing ?  ?

## 2021-09-22 NOTE — ED Notes (Signed)
Per Dr. Langston Masker, we are holding Atenolol at this time as pt did not pass her swallow screen.  ?

## 2021-09-22 NOTE — H&P (Addendum)
?History and Physical  ? ? ?Patient: [name] [facility] OQH:[id number] DOB: [birth year] ?DOA: 09/22/2021 ?DOS: the patient was seen and examined on 09/22/2021 ?PCP: [name], MD  ?Patient coming from: Home ? ?Chief Complaint:  ?Chief Complaint  ?Patient presents with  ? Aphasia  ? Code Stroke  ? ?HPI: [name] [place] is a 76 y.o. female with medical history significant of anemia, osteoarthritis, breast cancer, multiple myeloma, history of radiation therapy, , type II DM, E. 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  She was last seen at her baseline yesterday evening when her daughter came back from work spoke to her, fed, bath her and later helped her go to sleep uneventfully.  The patient is unable to provide further history at this time as she is having trouble answering simple questions. ? ?ED course: Initial vital signs were temperature 98 ?F, pulse 60, respirations 17, BP 203/86 mmHg O2 sat 100% on room air.  The patient received hydralazine 10 mg IVP, NS 1000 mL bolus and ceftriaxone 1 g IVPB. ? ?Lab work: Coronavirus influenza PCR negative.  UDS negative.  Urinalysis showed positive nitrites, moderate leukocyte esterase with 21-50 WBC and many bacteria on microscopic examination.  CBC with a white count 7.0, Mono 12.9 g/dL platelets 280.  PT 13.8, INR 1.1, PTT 37.  Normal ammonia and alcohol level.  CMP showed a glucose of 106 and creatinine of 1.57 mg/dL, and the rest of the CMP is unremarkable. ? ?Imaging: CT head code stroke, CTA head and neck and MRI of brain did not show any acute intracranial infarct or vessel occlusion.  There was occlusion versus severe stenosis of the  proximal left P2 PCA with poor distal opacification, severe right P2 PCA stenosis and moderate stenosis of bilateral M2 MCA branches and right A2 ACA.  MRI of the brain was motion degraded but no acute infarction was seen.  Please see images and full radiology report for further details. ? ?Review of Systems: As mentioned in the history of present illness. All other systems reviewed and are negative. ?Past Medical History:  ?Diagnosis Date  ? Anemia   ? 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 Arthralgia   ? 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 Breast cancer ([place])   ? Breast cancer, stage 1 ([place]) 09/09/2015  ? Chronic pain of left knee   ? Degenerative joint disease involving multiple joints 03/29/2011  ? Diabetes mellitus without complication ([place])   ? Patient reports she hasnt had Diabetes in years  ? DJD (degenerative joint disease)   ? E. coli gastroenteritis   ? GERD (gastroesophageal reflux disease)   ? Herpes simplex esophagitis 2006  ? History of blood transfusion   ? with first pregnancy  ? History of multiple myeloma   ? chemo XRT, Dr. Marin Olp  ? History of radiation therapy 03/16/16-05/02/16  ? right breast 50.4 Gy in 28 fractions, boost to 60.4 Gy in 5 fractions  ? 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 HTN (hypertension) 2005  ? Hyperlipidemia   ? Myeloma ([place]) 03/28/2011  ? OA (osteoarthritis)   ?  Paget's bone disease   ? Personal history of adenomatous colonic polyps 11/02/2011  ? 2 diminutive serrated adenomas 10/2011 Repeat colonoscopy 2018 (change from original recall based upon current guidelines 12/07/2014)    ? PONV (postoperative nausea and vomiting)   ? Radiation 09/28/2006 - 10/11/2006  ? skull treated to 1980 cGy by Dr. Elba Barman  ? Renal insufficiency   ? H/O resolved  ? Scalp laceration 06/05/2019  ? Stroke [facility])   ? TBI (traumatic brain injury) ([place]) 06/23/2019  ? Vitamin D deficiency   ? ?Past  Surgical History:  ?Procedure Laterality Date  ? 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 [facility], chemo tx at [facility] long-2006, radiation in 2008  ? BREAST LUMPECTOMY Right 11/2015  ? radiation and chemo  ? BREAST LUMPECTOMY WITH RADIOACTIVE SEED AND SENTINEL LYMPH NODE BIOPSY Right 09/22/2015  ? Procedure: BREAST LUMPECTOMY WITH RADIOACTIVE SEED AND SENTINEL LYMPH NODE BIOPSY;  Surgeon: Excell Seltzer, MD;  Location: [place];  Service: General;  Laterality: Right;  ? BUBBLE STUDY  10/07/2019  ? Procedure: BUBBLE STUDY;  Surgeon: Adrian Prows, MD;  Location: [place];  Service: Cardiovascular;;  ? COLONOSCOPY    ? IR REMOVAL TUN ACCESS W/ PORT W/O FL MOD SED  05/23/2018  ? JOINT REPLACEMENT Left   ? KNEE ARTHROSCOPY  05/2008  ? left  ? TEE WITHOUT CARDIOVERSION N/A 10/07/2019  ? Procedure: TRANSESOPHAGEAL ECHOCARDIOGRAM (TEE);  Surgeon: Adrian Prows, MD;  Location: [place];  Service: Cardiovascular;  Laterality: N/A;  ? TOTAL KNEE ARTHROPLASTY  10/11/2010  ? left  ? ?Social History:  reports that she has never smoked. She has never used smokeless tobacco. She reports that she does not drink alcohol and does not use drugs. ? ?Allergies  ?Allergen Reactions  ? Ace Inhibitors Other (See Comments)  ?  UNK reaction  ? Lisinopril Other (See Comments) and Cough  ?  Throat itching  ? ? ?Family History  ?Problem Relation Age of Onset  ? Heart attack Mother   ? Diabetes Mother   ? Lung cancer Father   ?     smoker  ? Liver cancer Father   ? Diabetes Sister   ? Throat cancer Brother   ? Esophageal cancer Brother   ? Colon polyps Daughter   ? Colon cancer Neg Hx   ? Rectal cancer Neg Hx   ? Stomach cancer Neg Hx   ? Breast cancer Neg Hx   ? Memory loss Neg Hx   ? Dementia Neg Hx   ? ? ?Prior to Admission medications   ?Medication Sig Start Date End Date Taking? Authorizing Provider  ?acetaminophen (TYLENOL) 500 MG tablet Take 500 mg by mouth every 6 (six) hours as needed for mild pain.   Yes [provider]   ?amLODipine (NORVASC) 10 MG tablet Take 1 tablet (10 mg total) by mouth daily. Patient needs appointment for  further refills 05/06/21  Yes Adrian Prows, MD  ?aspirin EC 81 MG tablet Take 81 mg by mouth daily.   Yes [provider]  ?atenolol (TENORMIN) 100 MG tablet TAKE 1 TABLET BY MOUTH EVERY DAY ?Patient taking differently: Take 100 mg by mouth daily. 09/20/20  Yes Adrian Prows, MD  ?donepezil (ARICEPT) 5 MG tablet Take 1 tablet (5 mg total) by mouth at bedtime. 06/02/21  Yes Ward Givens, NP  ?doxylamine, Sleep, (UNISOM) 25 MG tablet Take 25 mg by mouth at bedtime as needed for sleep.    Yes [provider]  ?  loperamide (IMODIUM) 2 MG capsule Take 2 mg by mouth daily as needed for diarrhea or loose stools.   Yes [provider]  ?megestrol (MEGACE) 40 MG tablet Take 40 mg by mouth daily. 09/24/20  Yes [provider]  ?memantine (NAMENDA) 5 MG tablet TAKE 1 TAB AT BEDTIME FOR 1 WEEK THEN INCREASE TO 1 TAB TWICE DAILY THEREAFTER ?Patient taking differently: Take 5 mg by mouth daily. 06/30/21  Yes Ward Givens, NP  ?omeprazole (PRILOSEC) 40 MG capsule Take 40 mg by mouth daily.   Yes [provider]  ?rosuvastatin (CRESTOR) 20 MG tablet TAKE 1/2 TABLET(10 MG) BY MOUTH DAILY. NEED APPT FOR FURTHER REFILLS ?Patient taking differently: Take 10 mg by mouth daily. 05/06/21  Yes Adrian Prows, MD  ?traMADol (ULTRAM) 50 MG tablet Take 1 tablet (50 mg total) by mouth every 8 (eight) hours as needed. ?Patient taking differently: Take 50 mg by mouth every 8 (eight) hours as needed for moderate pain. 09/20/21  Yes [name], MD  ? ? ?Physical Exam: ?Vitals:  ? 09/22/21 0945 09/22/21 1000 09/22/21 1100 09/22/21 1115  ?BP: (!) 177/90 (!) 179/136 (!) 146/71 (!) 140/108  ?Pulse: (!) 54 61 62 65  ?Resp: _0 ?Temp:      ?TempSrc:      ?SpO2: 99% 99% 99% 99%  ? ?Physical Exam ?Constitutional:   ?   General: She is awake.  ?HENT:  ?   Head: Normocephalic.  ?   Mouth/Throat:  ?    Mouth: Mucous membranes are moist.  ?Eyes:  ?   General: No scleral icterus. ?   Pupils: Pupils are equal, round, and reactive to light.  ?Neck:  ?   Vascular: No JVD.  ?Cardiovascular:  ?   Rate and Rhythm: Normal

## 2021-09-23 ENCOUNTER — Inpatient Hospital Stay (HOSPITAL_COMMUNITY)
Admit: 2021-09-23 | Discharge: 2021-09-23 | Disposition: A | Discharge status: Home / Self Care | Payer: Medicare Other | Attending: Internal Medicine | Admitting: Internal Medicine

## 2021-09-23 DIAGNOSIS — G9341 Metabolic encephalopathy: Secondary | ICD-10-CM | POA: Diagnosis not present

## 2021-09-23 DIAGNOSIS — R414 Neurologic neglect syndrome: Secondary | ICD-10-CM | POA: Diagnosis present

## 2021-09-23 DIAGNOSIS — E1122 Type 2 diabetes mellitus with diabetic chronic kidney disease: Secondary | ICD-10-CM | POA: Diagnosis present

## 2021-09-23 DIAGNOSIS — Z923 Personal history of irradiation: Secondary | ICD-10-CM

## 2021-09-23 DIAGNOSIS — H518 Other specified disorders of binocular movement: Secondary | ICD-10-CM | POA: Diagnosis present

## 2021-09-23 DIAGNOSIS — S098XXA Other specified injuries of head, initial encounter: Secondary | ICD-10-CM | POA: Diagnosis not present

## 2021-09-23 DIAGNOSIS — F0393 Unspecified dementia, unspecified severity, with mood disturbance: Secondary | ICD-10-CM | POA: Diagnosis present

## 2021-09-23 DIAGNOSIS — Z9484 Stem cells transplant status: Secondary | ICD-10-CM

## 2021-09-23 DIAGNOSIS — R410 Disorientation, unspecified: Secondary | ICD-10-CM | POA: Diagnosis present

## 2021-09-23 DIAGNOSIS — G9340 Encephalopathy, unspecified: Secondary | ICD-10-CM | POA: Diagnosis not present

## 2021-09-23 DIAGNOSIS — R4781 Slurred speech: Secondary | ICD-10-CM | POA: Diagnosis present

## 2021-09-23 DIAGNOSIS — R29705 NIHSS score 5: Secondary | ICD-10-CM | POA: Diagnosis present

## 2021-09-23 DIAGNOSIS — Z85820 Personal history of malignant melanoma of skin: Secondary | ICD-10-CM | POA: Diagnosis not present

## 2021-09-23 DIAGNOSIS — B9620 Unspecified Escherichia coli [E. coli] as the cause of diseases classified elsewhere: Secondary | ICD-10-CM | POA: Diagnosis present

## 2021-09-23 DIAGNOSIS — C9001 Multiple myeloma in remission: Secondary | ICD-10-CM

## 2021-09-23 DIAGNOSIS — I69320 Aphasia following cerebral infarction: Secondary | ICD-10-CM | POA: Diagnosis not present

## 2021-09-23 DIAGNOSIS — I672 Cerebral atherosclerosis: Secondary | ICD-10-CM | POA: Diagnosis present

## 2021-09-23 DIAGNOSIS — E876 Hypokalemia: Secondary | ICD-10-CM | POA: Diagnosis not present

## 2021-09-23 DIAGNOSIS — I63532 Cerebral infarction due to unspecified occlusion or stenosis of left posterior cerebral artery: Secondary | ICD-10-CM | POA: Diagnosis present

## 2021-09-23 DIAGNOSIS — G928 Other toxic encephalopathy: Secondary | ICD-10-CM | POA: Diagnosis present

## 2021-09-23 DIAGNOSIS — R4701 Aphasia: Secondary | ICD-10-CM | POA: Diagnosis not present

## 2021-09-23 DIAGNOSIS — I6389 Other cerebral infarction: Secondary | ICD-10-CM | POA: Diagnosis not present

## 2021-09-23 DIAGNOSIS — E872 Acidosis, unspecified: Secondary | ICD-10-CM | POA: Diagnosis present

## 2021-09-23 DIAGNOSIS — R4182 Altered mental status, unspecified: Secondary | ICD-10-CM

## 2021-09-23 DIAGNOSIS — Z87820 Personal history of traumatic brain injury: Secondary | ICD-10-CM | POA: Diagnosis not present

## 2021-09-23 DIAGNOSIS — N390 Urinary tract infection, site not specified: Secondary | ICD-10-CM

## 2021-09-23 DIAGNOSIS — I63512 Cerebral infarction due to unspecified occlusion or stenosis of left middle cerebral artery: Secondary | ICD-10-CM | POA: Diagnosis present

## 2021-09-23 DIAGNOSIS — Z20822 Contact with and (suspected) exposure to covid-19: Secondary | ICD-10-CM | POA: Diagnosis present

## 2021-09-23 DIAGNOSIS — I639 Cerebral infarction, unspecified: Secondary | ICD-10-CM | POA: Diagnosis not present

## 2021-09-23 DIAGNOSIS — Z853 Personal history of malignant neoplasm of breast: Secondary | ICD-10-CM

## 2021-09-23 DIAGNOSIS — N1832 Chronic kidney disease, stage 3b: Secondary | ICD-10-CM | POA: Diagnosis present

## 2021-09-23 DIAGNOSIS — E538 Deficiency of other specified B group vitamins: Secondary | ICD-10-CM | POA: Diagnosis present

## 2021-09-23 DIAGNOSIS — I129 Hypertensive chronic kidney disease with stage 1 through stage 4 chronic kidney disease, or unspecified chronic kidney disease: Secondary | ICD-10-CM | POA: Diagnosis present

## 2021-09-23 DIAGNOSIS — E785 Hyperlipidemia, unspecified: Secondary | ICD-10-CM | POA: Diagnosis present

## 2021-09-23 DIAGNOSIS — G9349 Other encephalopathy: Secondary | ICD-10-CM | POA: Diagnosis present

## 2021-09-23 DIAGNOSIS — G3184 Mild cognitive impairment, so stated: Secondary | ICD-10-CM | POA: Diagnosis not present

## 2021-09-23 LAB — CBC
HCT: 38.1 % (ref 36.0–46.0)
Hemoglobin: 12.4 g/dL (ref 12.0–15.0)
MCH: 31.4 pg (ref 26.0–34.0)
MCHC: 32.5 g/dL (ref 30.0–36.0)
MCV: 96.5 fL (ref 80.0–100.0)
Platelets: 252 10*3/uL (ref 150–400)
RBC: 3.95 MIL/uL (ref 3.87–5.11)
RDW: 13.3 % (ref 11.5–15.5)
WBC: 6.9 10*3/uL (ref 4.0–10.5)
nRBC: 0 % (ref 0.0–0.2)

## 2021-09-23 LAB — VITAMIN B12: Vitamin B-12: 143 pg/mL — ABNORMAL LOW (ref 180–914)

## 2021-09-23 LAB — BASIC METABOLIC PANEL
Anion gap: 8 (ref 5–15)
BUN: 12 mg/dL (ref 8–23)
CO2: 19 mmol/L — ABNORMAL LOW (ref 22–32)
Calcium: 8.8 mg/dL — ABNORMAL LOW (ref 8.9–10.3)
Chloride: 109 mmol/L (ref 98–111)
Creatinine, Ser: 1.26 mg/dL — ABNORMAL HIGH (ref 0.44–1.00)
GFR, Estimated: 45 mL/min — ABNORMAL LOW (ref >60)
Glucose, Bld: 71 mg/dL (ref 70–99)
Potassium: 4.6 mmol/L (ref 3.5–5.1)
Sodium: 136 mmol/L (ref 135–145)

## 2021-09-23 MED ORDER — HYDRALAZINE HCL 20 MG/ML IJ SOLN: 10.0000 mg | Freq: Four times a day (QID) | INTRAMUSCULAR | Status: DC | PRN

## 2021-09-23 MED ORDER — SODIUM CHLORIDE 0.9 % IV SOLN: INTRAVENOUS | Status: DC

## 2021-09-23 MED ORDER — HYDRALAZINE HCL 10 MG PO TABS
10.0000 mg | ORAL_TABLET | Freq: Two times a day (BID) | ORAL | Status: DC
  Administered 2021-09-23 – 2021-09-24 (×3): 10 mg via ORAL
  Filled 2021-09-23 (×3): qty 1

## 2021-09-23 MED ORDER — CYANOCOBALAMIN 1000 MCG/ML IJ SOLN
1000.0000 ug | Freq: Every day | INTRAMUSCULAR | Status: AC
  Administered 2021-09-23 – 2021-09-27 (×5): 1000 ug via INTRAMUSCULAR
  Filled 2021-09-23 (×5): qty 1

## 2021-09-23 NOTE — TOC Initial Note (Signed)
Transition of Care (TOC) - Initial/Assessment Note  ? ? ?Patient Details  ?Name: Tzirel Leonor [place] ?MRN: [id number] ?Date of Birth: 1945/08/26 ? ?Transition of Care [facility]) CM/SW Contact:    ?Leeroy Cha, RN ?Phone Number: ?09/23/2021, 10:18 AM ? ?Clinical Narrative:                 ? ?Transition of Care (TOC) Screening Note ? ? ?Patient Details  ?Name: Shellby Schlink [place] ?Date of Birth: 07-21-1945 ? ? ?Transition of Care [facility]) CM/SW Contact:    ?Leeroy Cha, RN ?Phone Number: ?09/23/2021, 10:18 AM ? ? ? ?Transition of Care Department [facility]) has reviewed patient and no TOC needs have been identified at this time. We will continue to monitor patient advancement through interdisciplinary progression rounds. If new patient transition needs arise, please place a TOC consult. ? ? ? ?Expected Discharge Plan: Home/Self Care ?Barriers to Discharge: No Barriers Identified ? ? ?Patient Goals and CMS Choice ?Patient states their goals for this hospitalization and ongoing recovery are:: to go back home ?CMS Medicare.gov Compare Post Acute Care list provided to:: Patient ?  ? ?Expected Discharge Plan and Services ?Expected Discharge Plan: Home/Self Care ?  ?Discharge Planning Services: CM Consult ?  ?Living arrangements for the past 2 months: Apartment ?                ?  ?  ?  ?  ?  ?  ?  ?  ?  ?  ? ?Prior Living Arrangements/Services ?Living arrangements for the past 2 months: Apartment ?Lives with:: Self ?Patient language and need for interpreter reviewed:: Yes ?Do you feel safe going back to the place where you live?: Yes      ?  ?  ?  ?Criminal Activity/Legal Involvement Pertinent to Current Situation/Hospitalization: No - Comment as needed ? ?Activities of Daily Living ?Home Assistive Devices/Equipment: Gilford Rile (specify type) ?ADL Screening (condition at time of admission) ?Patient's cognitive ability adequate to safely complete daily activities?: Yes ?Is the patient deaf or have difficulty hearing?: No ?Does the patient  have difficulty seeing, even when wearing glasses/contacts?: No ?Does the patient have difficulty concentrating, remembering, or making decisions?: Yes ?Patient able to express need for assistance with ADLs?: No ?Does the patient have difficulty dressing or bathing?: No ?Independently performs ADLs?: Yes (appropriate for developmental age) ?Does the patient have difficulty walking or climbing stairs?: Yes ?Weakness of Legs: Both ?Weakness of Arms/Hands: None ? ?Permission Sought/Granted ?  ?  ?   ?   ?   ?   ? ?Emotional Assessment ?Appearance:: Appears stated age ?  ?  ?Orientation: : Oriented to Self, Oriented to Place, Oriented to  Time, Oriented to Situation ?Alcohol / Substance Use: Not Applicable ?Psych Involvement: No (comment) ? ?Admission diagnosis:  Confusion [R41.0] ?Acute cystitis without hematuria [N30.00] ?Acute metabolic encephalopathy [C94.70] ?Patient Active Problem List  ? Diagnosis Date Noted  ? Acute metabolic encephalopathy 96/28/3662  ? Acute UTI (urinary tract infection) 09/22/2021  ? Left kidney mass 09/22/2021  ? Hypertension 09/22/2021  ? Hyperlipidemia 09/22/2021  ? Occipital stroke ([place]) 12/19/2019  ? Aphasia with TBI (traumatic brain injury), closed 07/28/2019  ? Visual field contraction, left 07/28/2019  ? Aphasia as late effect of stroke 07/28/2019  ? History of multiple strokes 07/28/2019  ? Visual field constriction, left 06/23/2019  ? Intractable acute post-traumatic headache 06/23/2019  ? 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 TBI (traumatic brain injury) ([place]) 06/23/2019  ?  Shuffling gait 06/23/2019  ? 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 Scalp laceration 06/05/2019  ? Lytic bone lesions on xray 03/14/2016  ? Anemia of chronic disease 12/14/2015  ? Chronic kidney disease, stage 3 ([place]) 12/01/2015  ? Anorexia 12/01/2015  ? Cough 12/01/2015  ? Breast cancer, stage 1 ([place])  09/09/2015  ? 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 Depression 04/11/2012  ? Personal history of adenomatous colonic polyps 11/02/2011  ? Degenerative joint disease involving multiple joints 03/29/2011  ? Myeloma ([place]) 03/28/2011  ? ?PCP:  [name], MD ?Pharmacy:   ?[id number] [place] [id number] ?[id number]?Phone: [phone]: [phone]? ? ? ? ?Social Determinants of Health (SDOH) Interventions ?  ? ?Readmission Risk Interventions ?   ? View : No data to display.  ?  ?  ?  ? ? ? ?

## 2021-09-23 NOTE — Evaluation (Signed)
Clinical/Bedside Swallow Evaluation ?Patient Details  ?Name: [name] ?MRN: [id number] ?Date of Birth: December 14, 1945 ? ?Today's Date: 09/23/2021 ?Time: SLP Start Time (ACUTE ONLY): 1410 SLP Stop Time (ACUTE ONLY): 1435 ?SLP Time Calculation (min) (ACUTE ONLY): 25 min ? ?Past Medical History:  ?Past Medical History:  ?Diagnosis Date  ? Anemia   ? 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 Arthralgia   ? 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 Breast cancer ([place])   ? Breast cancer, stage 1 ([place]) 09/09/2015  ? Chronic pain of left knee   ? Degenerative joint disease involving multiple joints 03/29/2011  ? Diabetes mellitus without complication ([place])   ? Patient reports she hasnt had Diabetes in years  ? DJD (degenerative joint disease)   ? E. coli gastroenteritis   ? GERD (gastroesophageal reflux disease)   ? Herpes simplex esophagitis 2006  ? History of blood transfusion   ? with first pregnancy  ? History of multiple myeloma   ? chemo XRT, Dr. Marin Olp  ? History of radiation therapy 03/16/16-05/02/16  ? right breast 50.4 Gy in 28 fractions, boost to 60.4 Gy in 5 fractions  ? 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 HTN (hypertension) 2005  ? Hyperlipidemia   ? Myeloma ([place]) 03/28/2011  ? OA (osteoarthritis)   ? Paget's bone disease   ? Personal history of adenomatous colonic polyps 11/02/2011  ? 2 diminutive serrated adenomas 10/2011 Repeat colonoscopy 2018 (change from original recall based upon current guidelines 12/07/2014)    ? PONV (postoperative nausea and vomiting)   ? Radiation 09/28/2006 - 10/11/2006  ? skull treated to 1980 cGy by Dr. Elba Barman  ? Renal insufficiency   ? H/O resolved  ? Scalp laceration 06/05/2019  ? Stroke [facility])   ? TBI (traumatic brain injury) ([place]) 06/23/2019  ? Vitamin D deficiency   ? ?Past Surgical History:  ?Past Surgical History:  ?Procedure Laterality Date  ? 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 [facility],  chemo tx at [facility]-2006, radiation in 2008  ? BREAST LUMPECTOMY Right 11/2015  ? radiation and chemo  ? BREAST LUMPECTOMY WITH RADIOACTIVE SEED AND SENTINEL LYMPH NODE BIOPSY Right 09/22/2015  ? Procedure: BREAST LUMPECTOMY WITH RADIOACTIVE SEED AND SENTINEL LYMPH NODE BIOPSY;  Surgeon: Excell Seltzer, MD;  Location: [place];  Service: General;  Laterality: Right;  ? BUBBLE STUDY  10/07/2019  ? Procedure: BUBBLE STUDY;  Surgeon: Adrian Prows, MD;  Location: [place];  Service: Cardiovascular;;  ? COLONOSCOPY    ? IR REMOVAL TUN ACCESS W/ PORT W/O FL MOD SED  05/23/2018  ? JOINT REPLACEMENT Left   ? KNEE ARTHROSCOPY  05/2008  ? left  ? TEE WITHOUT CARDIOVERSION N/A 10/07/2019  ? Procedure: TRANSESOPHAGEAL ECHOCARDIOGRAM (TEE);  Surgeon: Adrian Prows, MD;  Location: [place];  Service: Cardiovascular;  Laterality: N/A;  ? TOTAL KNEE ARTHROPLASTY  10/11/2010  ? left  ? ?HPI:  ?PT is a 76 y.o. female with medical history significant of anemia, osteoarthritis, breast cancer, multiple myeloma, history of radiation therapy, , type II DM, E. 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  CT head and MRI brain negative for acute changes.  Daughter admits pt had language deficits after  her TBI.  Pt did not pass 3 ounce Yale swallow screen, thus SLP swallow eval ordered.    [name], daughter, reports prior to admit, pt was holding food in her mouth and her appetite has been poor.  ?  ?Assessment / Plan / Recommendation  ?Clinical Impression ? Patient presents with cognitive based dysphagia (suspect acute on chronic from dementia and TBI).   Pt did not follow directions but did participate via mimicing and hand-over-hand assist.  Pt helped self feed graham crackers, peaches, soda and juice via straw.  Swallow with liquids  from straw - clinically judged to be timely.  Vertical mastication pattern *instead of rotary* without consistent adequate oral clearance before taking more. She benefited from total cues/assist to take liquids to aid oral transiting. No indication of aspiration with intake provided.  Recommend dys3/thin with strict precautions. Educated daughter, [name], to changes with swallowing associated with progressive cognitive decline as well as compensation strategies using teach back.  Advised to monitor pt's function and advise MD if does not improve to baseline level.  Note pt with left gaze preference, which daughter states is new befoe admit.  Pt is not following one step directions, given absence of neuro event per MRI/CT, will defer SLE.  If pt's cognitive linguistic skills do not return to close to baseline, [facility] SLP can be ordered for eval/tx in pt's familiar environment.  Thanks for this consult. ?SLP Visit Diagnosis: Dysphagia, oral phase (R13.11) ?   ?Aspiration Risk ? Mild aspiration risk  ?  ?Diet Recommendation Dysphagia 3 (Mech soft);Thin liquid  ? ?Liquid Administration via: Straw ?Medication Administration: Other (Comment) (as tolerated, ? crush with icecream, etc) ?Supervision: Staff to assist with self feeding ?Compensations: Slow rate;Small sips/bites;Other (Comment) (assure adequate oral clearance) ?Postural Changes: Seated upright at 90 degrees;Remain upright for at least 30 minutes after po intake  ?  ?Other  Recommendations Oral Care Recommendations: Staff/trained caregiver to provide oral care   ? ?Recommendations for follow up therapy are one component of a multi-disciplinary discharge planning process, led by the attending physician.  Recommendations may be updated based on patient status, additional functional criteria and insurance authorization. ? ?Follow up Recommendations Home health SLP (for evaluation if cognitive/linguistic deficits do not resolve)  ? ? ?  ?Assistance Recommended at  Discharge Frequent or constant Supervision/Assistance  ?Functional Status Assessment Patient has had a recent decline in their functional status and/or demonstrates limited ability to make significant improvements in function in a reasonable and predictable amount of time  ?Frequency and Duration    ? N/A ?  ?   ? ?Prognosis   N/a ? ?  ? ?Swallow Study   ?General Date of Onset: 09/23/21 ?HPI: PT is a 76 y.o. female with medical history significant of anemia, osteoarthritis, breast cancer, multiple myeloma, history of radiation therapy, , type II DM, E. 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  CT head and MRI brain negative for acute changes.  Daughter admits pt had language deficits after her TBI.  Pt did not pass 3 ounce Yale swallow screen, thus SLP swallow eval ordered.    [name], daughter, reports prior to admit, pt was holding food in her mouth and her appetite has been poor. ?Type of Study: Bedside Swallow Evaluation ?Diet Prior to this Study: NPO ?Temperature Spikes Noted: No ?  Respiratory Status: Room air ?History of Recent Intubation: No ?Behavior/Cognition: Alert;Doesn't follow directions ?Oral Cavity Assessment: Other (comment) (pt did not follow directions, but was able to view oral cavity momentarily - appearing clear and moist) ?Oral Care Completed by SLP: Yes (pt assisted to brush anterior dentition) ?Oral Cavity - Dentition: Adequate natural dentition ?Self-Feeding Abilities: Needs assist ?Patient Positioning: Upright in chair ?Baseline Vocal Quality: Normal ?Volitional Cough: Cognitively unable to elicit ?Volitional Swallow: Unable to elicit  ?  ?Oral/Motor/Sensory Function Overall Oral Motor/Sensory Function: Other (comment) (? minimal left facial asymmetry - but appears baseline per review of pt picture)    ?Ice Chips Ice chips: Not tested   ?Thin Liquid Thin Liquid: Within functional limits ?Presentation: Straw  ?  ?Nectar Thick Nectar Thick Liquid: Not tested   ?Honey Thick Honey Thick Liquid: Not tested   ?Puree

## 2021-09-23 NOTE — Progress Notes (Signed)
?PROGRESS NOTE ? ? ? ?[name]  GSU:[id number] DOB: 07-Feb-1946 DOA: 09/22/2021 ?PCP: [name], MD  ? ?Brief Narrative: ?76 year old with past medical history significant for anemia, osteoarthritis, breast cancer, multiple myeloma, diabetes type 2, herpes esophagitis, history of CVA, TBI with resultant aphasia recurrent UTI who presents with worsening aphasia and slurred speech, she presented outside the window for tPA.  A code stroke was called.  Neurology was consulted, MRI subsequently negative for stroke. ? ? ?Assessment & Plan: ?  ?Principal Problem: ?  Acute metabolic encephalopathy ?Active Problems: ?  Depression ?  Chronic kidney disease, stage 3 (HCC) ?  Amnestic MCI (mild cognitive impairment with memory loss) ?  Aphasia with TBI (traumatic brain injury), closed ?  History of multiple strokes ?  Acute UTI (urinary tract infection) ?  Left kidney mass ?  Hypertension ?  Hyperlipidemia ? ?1-Acute Metabolic Encephalopathy;  ?Patient presented with worsening aphasia, slurred speech. MRI negative for stroke. Evaluated by neurology, symptoms likely related to UTI.  ?B 12 low, started on supplement. That will help with gait disturbance and memory issues.  ?Plan to check EEG for completeness.  ?Still aphasic, not back to baseline.  ?PT/OT evaluation.  ?Control BP ? ?2-HTN;  ?Continue with atenolol and Norvasc.  We will start low-dose hydralazine for better blood pressure control. ? ?3-Dementia: Replete B12.  Continue with Namenda ? ?4-UTI: Continue with ceftriaxone ?Continue with IV fluids ?Urine culture growing E. Coli ? ?5-CKD stage IIIb: Monitor renal function. ?Prior cr range 1,5  ?Cr down to 1.2 better than baseline today.  ? ?6-aphasia secondary to  TBI, amnestic MCI, depression ?Continue with donezepil.  Replete B12 ? ?History of multiple stroke: Continue with aspirin ? ?Left kidney mass: She was referred to urology ? ?B 12 deficiency. Start B 12 supplement.  ? ?Estimated body mass index is 27.81  kg/m? as calculated from the following: ?  Height as of 06/02/21: '5\' 3"'  (1.6 m). ?  Weight as of 09/02/21: 71.2 kg. ? ? ?DVT prophylaxis: Lovenox ?Code Status: Full Code ?Family Communication: Daughter at bedside ?Disposition Plan:  ?Status is: Observation ?The patient will require care spanning > 2 midnights and should be moved to inpatient because: management UTI, B 12 deficiency BP management ? ? ? ?Consultants:  ?[place] [facility]  ? ?Procedures:  ?EEG ? ?Antimicrobials:  ?Ceftriaxone.  ? ?Subjective: ?She is alert, still aphasic, she was able to tell me she has 3 children 2 daughter one son.  ?Denies headaches.  ? ?Objective: ?Vitals:  ? 09/22/21 1404 09/22/21 2227 09/23/21 0057 09/23/21 0600  ?BP: (!) 156/63 (!) 180/79 (!) 175/74 (!) 163/79  ?Pulse: 64 64 63 63  ?Resp: '16 15 14 14  ' ?Temp: 98.6 ?F (37 ?C) 98 ?F (36.7 ?C) 98.3 ?F (36.8 ?C) 97.6 ?F (36.4 ?C)  ?TempSrc: Oral Oral Oral Oral  ?SpO2: 100% 99% 99% 100%  ? ? ?Intake/Output Summary (Last 24 hours) at 09/23/2021 0911 ?Last data filed at 09/23/2021 0500 ?Gross per 24 hour  ?Intake 1893.51 ml  ?Output --  ?Net 1893.51 ml  ? ?There were no vitals filed for this visit. ? ?Examination: ? ?General exam: Appears calm and comfortable  ?Respiratory system: Clear to auscultation. Respiratory effort normal. ?Cardiovascular system: S1 & S2 heard, RRR. No JVD, murmurs, rubs, gallops or clicks. No pedal edema. ?Gastrointestinal system: Abdomen is nondistended, soft and nontender. No organomegaly or masses felt. Normal bowel sounds heard. ?Central nervous system: Alert and oriented. Aphasic, follows command ?Extremities: Symmetric 5  x 5 power. ? ? ?Data Reviewed: I have personally reviewed following labs and imaging studies ? ?CBC: ?Recent Labs  ?Lab 09/22/21 ?0900 09/22/21 ?0086 09/23/21 ?0456  ?WBC 7.0  --  6.9  ?NEUTROABS 4.9  --   --   ?HGB 12.9 13.6 12.4  ?HCT 40.8 40.0 38.1  ?MCV 98.6  --  96.5  ?PLT 280  --  252  ? ?Basic Metabolic Panel: ?Recent Labs  ?Lab  09/22/21 ?0900 09/22/21 ?7619 09/23/21 ?0456  ?NA 143 144 136  ?K 4.4 4.3 4.6  ?CL 111 109 109  ?CO2 25  --  19*  ?GLUCOSE 106* 97 71  ?BUN '14 12 12  ' ?CREATININE 1.57* 1.50* 1.26*  ?CALCIUM 9.8  --  8.8*  ? ?GFR: ?CrCl cannot be calculated (Unknown ideal weight.). ?Liver Function Tests: ?Recent Labs  ?Lab 09/22/21 ?0900  ?AST 10*  ?ALT 11  ?ALKPHOS 71  ?BILITOT 0.6  ?PROT 8.0  ?ALBUMIN 4.0  ? ?No results for input(s): LIPASE, AMYLASE in the last 168 hours. ?Recent Labs  ?Lab 09/22/21 ?0837  ?AMMONIA <10  ? ?Coagulation Profile: ?Recent Labs  ?Lab 09/22/21 ?0900  ?INR 1.1  ? ?Cardiac Enzymes: ?No results for input(s): CKTOTAL, CKMB, CKMBINDEX, TROPONINI in the last 168 hours. ?BNP (last 3 results) ?No results for input(s): PROBNP in the last 8760 hours. ?HbA1C: ?No results for input(s): HGBA1C in the last 72 hours. ?CBG: ?Recent Labs  ?Lab 09/22/21 ?0836  ?GLUCAP 90  ? ?Lipid Profile: ?No results for input(s): CHOL, HDL, LDLCALC, TRIG, CHOLHDL, LDLDIRECT in the last 72 hours. ?Thyroid Function Tests: ?No results for input(s): TSH, T4TOTAL, FREET4, T3FREE, THYROIDAB in the last 72 hours. ?Anemia Panel: ?Recent Labs  ?  09/23/21 ?0708  ?VITAMINB12 143*  ? ?Sepsis Labs: ?No results for input(s): PROCALCITON, LATICACIDVEN in the last 168 hours. ? ?Recent Results (from the past 240 hour(s))  ?Resp Panel by RT-PCR (Flu A&B, Covid) Nasopharyngeal Swab     Status: None  ? Collection Time: 09/22/21  8:37 AM  ? Specimen: Nasopharyngeal Swab; Nasopharyngeal(NP) swabs in vial transport medium  ?Result Value Ref Range Status  ? SARS Coronavirus 2 by RT PCR NEGATIVE NEGATIVE Final  ?  Comment: (NOTE) ?SARS-CoV-2 target nucleic acids are NOT DETECTED. ? ?The SARS-CoV-2 RNA is generally detectable in upper respiratory ?specimens during the acute phase of infection. The lowest ?concentration of SARS-CoV-2 viral copies this assay can detect is ?138 copies/mL. A negative result does not preclude SARS-Cov-2 ?infection and should not be  used as the sole basis for treatment or ?other patient management decisions. A negative result may occur with  ?improper specimen collection/handling, submission of specimen other ?than nasopharyngeal swab, presence of viral mutation(s) within the ?areas targeted by this assay, and inadequate number of viral ?copies(<138 copies/mL). A negative result must be combined with ?clinical observations, patient history, and epidemiological ?information. The expected result is Negative. ? ?Fact Sheet for Patients:  ?EntrepreneurPulse.com.au ? ?Fact Sheet for Healthcare Providers:  ?IncredibleEmployment.be ? ?This test is no t yet approved or cleared by the [place] FDA and  ?has been authorized for detection and/or diagnosis of SARS-CoV-2 by ?FDA under an Emergency Use Authorization (EUA). This EUA will remain  ?in effect (meaning this test can be used) for the duration of the ?COVID-19 declaration under Section 564(b)(1) of the Act, 21 ?U.S.C.section 360bbb-3(b)(1), unless the authorization is terminated  ?or revoked sooner.  ? ? ?  ? Influenza A by PCR NEGATIVE NEGATIVE Final  ? Influenza  B by PCR NEGATIVE NEGATIVE Final  ?  Comment: (NOTE) ?The Xpert Xpress SARS-CoV-2/FLU/RSV plus assay is intended as an aid ?in the diagnosis of influenza from Nasopharyngeal swab specimens and ?should not be used as a sole basis for treatment. Nasal washings and ?aspirates are unacceptable for Xpert Xpress SARS-CoV-2/FLU/RSV ?testing. ? ?Fact Sheet for Patients: ?EntrepreneurPulse.com.au ? ?Fact Sheet for Healthcare Providers: ?IncredibleEmployment.be ? ?This test is not yet approved or cleared by the [place] FDA and ?has been authorized for detection and/or diagnosis of SARS-CoV-2 by ?FDA under an Emergency Use Authorization (EUA). This EUA will remain ?in effect (meaning this test can be used) for the duration of the ?COVID-19 declaration under Section  564(b)(1) of the Act, 21 U.S.C. ?section 360bbb-3(b)(1), unless the authorization is terminated or ?revoked. ? ?Performed at [facility], [address] ?[place], [place] [address] ?  ?  ? ? ? ? ? ?Ra

## 2021-09-23 NOTE — Progress Notes (Signed)
EEG complete - results pending 

## 2021-09-23 NOTE — Procedures (Signed)
Patient Name: [name] [place]  ?MRN: [id number]  ?Epilepsy Attending: Lora Havens  ?Referring Physician/Provider: Elmarie Shiley, MD ?Date: 09/23/2021 ?Duration: 26.38 mins ? ?Patient history: 76yo F with ams. EEG to evaluate for seizure ? ?Level of alertness: Awake ? ?AEDs during EEG study: None ? ?Technical aspects: This EEG study was done with scalp electrodes positioned according to the 10-20 International system of electrode placement. Electrical activity was acquired at a sampling rate of '500Hz'$  and reviewed with a high frequency filter of '70Hz'$  and a low frequency filter of '1Hz'$ . EEG data were recorded continuously and digitally stored.  ? ?Description: No clear posterior dominant rhythm was not seen. EEG showed continuous generalized and lateralized left hemisphere 3 to 6 Hz theta-delta slowing. Hyperventilation and photic stimulation were not performed.    ? ?ABNORMALITY ?- Continuous slow, generalized and lateralized left hemisphere ? ?IMPRESSION: ?This study is suggestive of cortical dysfunction arising from left hemisphere likely secondary to underlying structural abnormality. Additionally there is ?moderate diffuse encephalopathy, nonspecific etiology. No seizures or epileptiform discharges were seen throughout the recording. ? ?Lora Havens  ? ?

## 2021-09-23 NOTE — Consult Note (Signed)
Referral MD ? ?Reason for Referral: Altered mental status ? ?Chief Complaint  ?Patient presents with  ? Aphasia  ? Code Stroke  ?: Patient really cannot give any history. ? ?HPI: [name] is well-known to me.  She is a wonderful 76 year old Afro-American female.  I have been seeing her for over 15 years.  She initially had a history of myeloma.  She was treated with chemotherapy.  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  She had a stem cell transplant back in 2006.  Since then, she really has had no evidence of disease. ? ?She was diagnosed with stage I breast cancer of the right breast.  This was back in 2017.  She underwent lumpectomy and radiation therapy.  The radiation therapy was completed in December 2017.  So far, has been no evidence of recurrent disease. ? ?She suffered a traumatic brain injury a couple years ago.  She has been living with her daughter. ? ?I just saw [name] recently.  We did do a CT of the body on her.  I want to make sure that nothing was going on with respect to her breast cancer.  The CT of the body did not show any evidence of recurrent breast cancer.  However, there was a mass on the left kidney.  Patient then underwent an MRI of the left kidney.  This confirmed a 2.7 cm mass. ? ?She is to see urology for the possibility of a resection. ? ?She was admitted yesterday.  Her daughter her set up to see [place] today in the office because it looks like she may have had a UTI. ? ?She developed some change in mental status.  She was admitted.  She has had MRI of the brain.  This was relatively unremarkable.  There is no evidence of metastatic disease.  There is no obvious bleed or infarct. ? ?Her electrolytes when she came in looked okay.  Her sodium was 136.  Her calcium was 8.8.  Her white cell count 6.9.  Hemoglobin 12.4.  Platelet count 252,000. ? ?Of note, when we last saw her in April, her myeloma studies all looked normal.  There is no monoclonal spike in her blood. ? ?This morning,  she still is somewhat confused.  She really did not know who I was. ? ?She is in no obvious pain.  There is no obvious seizure. ? ?She had a telemedicine consult with Neurology.  He felt that the change in mental state may be secondary to her potential urinary tract infection. ? ?She has had no fever.  There has been no bleeding.  She has had no diarrhea.  She has had no cough. ? ?She has not been eating all that well. ? ?Overall, I would say performance status is probably ECOG 2. ? ? ? ?Past Medical History:  ?Diagnosis Date  ? Anemia   ? 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 Arthralgia   ? 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 Breast cancer ([place])   ? Breast cancer, stage 1 ([place]) 09/09/2015  ? Chronic pain of left knee   ? Degenerative joint disease involving multiple joints 03/29/2011  ? Diabetes mellitus without complication ([place])   ? Patient reports she hasnt had Diabetes in years  ? DJD (degenerative joint disease)   ? E. coli gastroenteritis   ? GERD (gastroesophageal reflux disease)   ? Herpes simplex esophagitis 2006  ? History of blood transfusion   ?  with first pregnancy  ? History of multiple myeloma   ? chemo XRT, Dr. Marin Olp  ? History of radiation therapy 03/16/16-05/02/16  ? right breast 50.4 Gy in 28 fractions, boost to 60.4 Gy in 5 fractions  ? 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 HTN (hypertension) 2005  ? Hyperlipidemia   ? Myeloma ([place]) 03/28/2011  ? OA (osteoarthritis)   ? Paget's bone disease   ? Personal history of adenomatous colonic polyps 11/02/2011  ? 2 diminutive serrated adenomas 10/2011 Repeat colonoscopy 2018 (change from original recall based upon current guidelines 12/07/2014)    ? PONV (postoperative nausea and vomiting)   ? Radiation 09/28/2006 - 10/11/2006  ? skull treated to 1980 cGy by Dr. Elba Barman  ? Renal insufficiency   ? H/O resolved  ? Scalp laceration 06/05/2019  ? Stroke [facility])    ? TBI (traumatic brain injury) ([place]) 06/23/2019  ? Vitamin D deficiency   ?: ? ? ?Past Surgical History:  ?Procedure Laterality Date  ? 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 [facility], chemo tx at [facility] long-2006, radiation in 2008  ? BREAST LUMPECTOMY Right 11/2015  ? radiation and chemo  ? BREAST LUMPECTOMY WITH RADIOACTIVE SEED AND SENTINEL LYMPH NODE BIOPSY Right 09/22/2015  ? Procedure: BREAST LUMPECTOMY WITH RADIOACTIVE SEED AND SENTINEL LYMPH NODE BIOPSY;  Surgeon: Excell Seltzer, MD;  Location: [facility];  Service: General;  Laterality: Right;  ? BUBBLE STUDY  10/07/2019  ? Procedure: BUBBLE STUDY;  Surgeon: Adrian Prows, MD;  Location: [facility];  Service: Cardiovascular;;  ? COLONOSCOPY    ? IR REMOVAL TUN ACCESS W/ PORT W/O FL MOD SED  05/23/2018  ? JOINT REPLACEMENT Left   ? KNEE ARTHROSCOPY  05/2008  ? left  ? TEE WITHOUT CARDIOVERSION N/A 10/07/2019  ? Procedure: TRANSESOPHAGEAL ECHOCARDIOGRAM (TEE);  Surgeon: Adrian Prows, MD;  Location: [facility];  Service: Cardiovascular;  Laterality: N/A;  ? TOTAL KNEE ARTHROPLASTY  10/11/2010  ? left  ?: ? ? ?Current Facility-Administered Medications:  ?  0.9 %  sodium chloride infusion, , Intravenous, Continuous, Regalado, Belk[place] A, MD ?  acetaminophen (TYLENOL) tablet 650 mg, 650 mg, Oral, Q6H PRN **OR** acetaminophen (TYLENOL) suppository 650 mg, 650 mg, Rectal, Q6H PRN, Reubin Milan, MD ?  amLODipine (NORVASC) tablet 10 mg, 10 mg, Oral, Daily, Reubin Milan, MD ?  aspirin EC tablet 81 mg, 81 mg, Oral, Daily, Reubin Milan, MD ?  atenolol (TENORMIN) tablet 100 mg, 100 mg, Oral, Daily, Reubin Milan, MD ?  cefTRIAXone (ROCEPHIN) 1 g in sodium chloride 0.9 % 100 mL IVPB, 1 g, Intravenous, Q24H, Reubin Milan, MD ?  donepezil (ARICEPT) tablet 5 mg, 5 mg, Oral, QHS, Reubin Milan, MD ?  doxylamine (Sleep) Drema Balzarine) tablet 25 mg, 25 mg, Oral, QHS PRN, Reubin Milan, MD ?  enoxaparin (LOVENOX) injection 40 mg, 40 mg,  Subcutaneous, Q24H, Reubin Milan, MD, 40 mg at 09/22/21 1759 ?  megestrol (MEGACE) tablet 40 mg, 40 mg, Oral, Daily, Reubin Milan, MD ?  memantine [facility]) tablet 5 mg, 5 mg, Oral, Daily, Reubin Milan, MD ?  ondansetron [facility]) tablet 4 mg, 4 mg, Oral, Q6H PRN **OR** ondansetron (ZOFRAN) injection 4 mg, 4 mg, Intravenous, Q6H PRN, Reubin Milan, MD ?  pantoprazole (PROTONIX) EC tablet 40 mg, 40 mg, Oral, Daily, Reubin Milan, MD ?  rosuvastatin (CRESTOR) tablet 10 mg, 10  mg, Oral, Daily, Reubin Milan, MD ?  traMADol Veatrice Bourbon) tablet 25 mg, 25 mg, Oral, Q6H PRN, Reubin Milan, MD: ? ? amLODipine  10 mg Oral Daily  ? aspirin EC  81 mg Oral Daily  ? atenolol  100 mg Oral Daily  ? donepezil  5 mg Oral QHS  ? enoxaparin (LOVENOX) injection  40 mg Subcutaneous Q24H  ? megestrol  40 mg Oral Daily  ? memantine  5 mg Oral Daily  ? pantoprazole  40 mg Oral Daily  ? rosuvastatin  10 mg Oral Daily  ?: ? ? ?Allergies  ?Allergen Reactions  ? Ace Inhibitors Other (See Comments)  ?  UNK reaction  ? Lisinopril Other (See Comments) and Cough  ?  Throat itching  ?: ? ? ?Family History  ?Problem Relation Age of Onset  ? Heart attack Mother   ? Diabetes Mother   ? Lung cancer Father   ?     smoker  ? Liver cancer Father   ? Diabetes Sister   ? Throat cancer Brother   ? Esophageal cancer Brother   ? Colon polyps Daughter   ? Colon cancer Neg Hx   ? Rectal cancer Neg Hx   ? Stomach cancer Neg Hx   ? Breast cancer Neg Hx   ? Memory loss Neg Hx   ? Dementia Neg Hx   ?: ? ? ?Social History  ? ?Socioeconomic History  ? Marital status: Divorced  ?  Spouse name: Not on file  ? Number of children: 3  ? Years of education: Not on file  ? Highest education level: Not on file  ?Occupational History  ? Occupation: disability  ?  Employer: RETIRED  ?Tobacco Use  ? Smoking status: Never  ? Smokeless tobacco: Never  ? Tobacco comments:  ?  never used tobacco  ?Vaping Use  ? Vaping Use: Never used   ?Substance and Sexual Activity  ? Alcohol use: No  ?  Alcohol/week: 0.0 standard drinks  ? Drug use: No  ? Sexual activity: Not Currently  ?Other Topics Concern  ? Not on file  ?Social History Narrative  ?   ?

## 2021-09-24 ENCOUNTER — Inpatient Hospital Stay (HOSPITAL_COMMUNITY): Payer: Medicare Other

## 2021-09-24 DIAGNOSIS — N390 Urinary tract infection, site not specified: Secondary | ICD-10-CM | POA: Diagnosis not present

## 2021-09-24 DIAGNOSIS — G9341 Metabolic encephalopathy: Secondary | ICD-10-CM | POA: Diagnosis not present

## 2021-09-24 LAB — CBC
HCT: 41.2 % (ref 36.0–46.0)
Hemoglobin: 13.6 g/dL (ref 12.0–15.0)
MCH: 32.1 pg (ref 26.0–34.0)
MCHC: 33 g/dL (ref 30.0–36.0)
MCV: 97.2 fL (ref 80.0–100.0)
Platelets: 280 10*3/uL (ref 150–400)
RBC: 4.24 MIL/uL (ref 3.87–5.11)
RDW: 13.4 % (ref 11.5–15.5)
WBC: 7.6 10*3/uL (ref 4.0–10.5)
nRBC: 0 % (ref 0.0–0.2)

## 2021-09-24 LAB — URINE CULTURE: Culture: >=100000 — AB

## 2021-09-24 LAB — BASIC METABOLIC PANEL
Anion gap: 11 (ref 5–15)
BUN: 13 mg/dL (ref 8–23)
CO2: 19 mmol/L — ABNORMAL LOW (ref 22–32)
Calcium: 9.4 mg/dL (ref 8.9–10.3)
Chloride: 110 mmol/L (ref 98–111)
Creatinine, Ser: 1.33 mg/dL — ABNORMAL HIGH (ref 0.44–1.00)
GFR, Estimated: 42 mL/min — ABNORMAL LOW (ref >60)
Glucose, Bld: 94 mg/dL (ref 70–99)
Potassium: 4.2 mmol/L (ref 3.5–5.1)
Sodium: 140 mmol/L (ref 135–145)

## 2021-09-24 MED ORDER — VANCOMYCIN HCL 1500 MG/300ML IV SOLN
1500.0000 mg | Freq: Once | INTRAVENOUS | Status: AC
  Administered 2021-09-24: 1500 mg via INTRAVENOUS
  Filled 2021-09-24: qty 300

## 2021-09-24 MED ORDER — SODIUM CHLORIDE 0.9 % IV SOLN
2.0000 g | Freq: Two times a day (BID) | INTRAVENOUS | Status: DC
  Administered 2021-09-24 – 2021-09-25 (×3): 2 g via INTRAVENOUS
  Filled 2021-09-24 (×5): qty 20

## 2021-09-24 MED ORDER — LEVETIRACETAM 500 MG PO TABS
500.0000 mg | ORAL_TABLET | Freq: Two times a day (BID) | ORAL | Status: DC
  Administered 2021-09-24 – 2021-09-25 (×2): 500 mg via ORAL
  Filled 2021-09-24 (×2): qty 1

## 2021-09-24 MED ORDER — SODIUM CHLORIDE 0.9 % IV SOLN
2.0000 g | INTRAVENOUS | Status: DC
  Administered 2021-09-24: 2 g via INTRAVENOUS
  Filled 2021-09-24 (×2): qty 20

## 2021-09-24 MED ORDER — LORAZEPAM 2 MG/ML IJ SOLN
1.0000 mg | Freq: Once | INTRAMUSCULAR | Status: AC
  Administered 2021-09-24: 1 mg via INTRAVENOUS
  Filled 2021-09-24: qty 1

## 2021-09-24 MED ORDER — VANCOMYCIN HCL IN DEXTROSE 1-5 GM/200ML-% IV SOLN
1000.0000 mg | INTRAVENOUS | Status: DC
  Administered 2021-09-25: 1000 mg via INTRAVENOUS
  Filled 2021-09-24: qty 200

## 2021-09-24 MED ORDER — LACTATED RINGERS IV BOLUS
500.0000 mL | Freq: Once | INTRAVENOUS | Status: AC
  Administered 2021-09-24: 500 mL via INTRAVENOUS

## 2021-09-24 MED ORDER — AMPICILLIN SODIUM 2 G IJ SOLR
2.0000 g | Freq: Four times a day (QID) | INTRAMUSCULAR | Status: DC
  Administered 2021-09-24 – 2021-09-25 (×4): 2 g via INTRAVENOUS
  Filled 2021-09-24 (×8): qty 2000

## 2021-09-24 MED ORDER — DEXTROSE 5 % IV SOLN
10.0000 mg/kg | Freq: Two times a day (BID) | INTRAVENOUS | Status: DC
  Administered 2021-09-24 – 2021-09-25 (×2): 710 mg via INTRAVENOUS
  Filled 2021-09-24 (×5): qty 14.2

## 2021-09-24 MED ORDER — SODIUM CHLORIDE 0.9 % IV SOLN
2000.0000 mg | Freq: Once | INTRAVENOUS | Status: AC
  Administered 2021-09-24: 2000 mg via INTRAVENOUS
  Filled 2021-09-24: qty 20

## 2021-09-24 NOTE — Progress Notes (Addendum)
?PROGRESS NOTE ? ? ? ?[name] [place]  JKK:[id number] DOB: 06-02-1945 DOA: 09/22/2021 ?PCP: [name], MD  ? ?Brief Narrative: ?76 year old with past medical history significant for anemia, osteoarthritis, breast cancer, multiple myeloma, diabetes type 2, herpes esophagitis, history of CVA, TBI with resultant aphasia recurrent UTI who presents with worsening aphasia and slurred speech, she presented outside the window for tPA.  A code stroke was called.  Neurology was consulted, MRI subsequently negative for stroke. ? ?Patient started on treatment for UTI. She develops worsening MS, aphasia and Leftward gaze preference. Neurology Consulted again. Patient will be transfer to [facility] for continuous EEG. Repeat MRI and formal neurology consultation at [facility].  ? ? ?Assessment & Plan: ?  ?Principal Problem: ?  Acute metabolic encephalopathy ?Active Problems: ?  Depression ?  Chronic kidney disease, stage 3 (HCC) ?  Amnestic MCI (mild cognitive impairment with memory loss) ?  Aphasia with TBI (traumatic brain injury), closed ?  History of multiple strokes ?  Acute UTI (urinary tract infection) ?  Left kidney mass ?  Hypertension ?  Hyperlipidemia ? ?1-Acute Metabolic Encephalopathy;  ?Patient presented with worsening aphasia, slurred speech. MRI negative for stroke. Evaluated by neurology, symptoms could be related to UTI.  ?B 12 low, started on supplement. That will help with gait disturbance and memory issues.  ?PT/OT evaluation.  ?Control BP ?EEG: Continuous slow, generalized and lateralized left hemisphere ?-Patient with worsening aphasia, and now leftward gaze preference since yesterday morning around 11 am, notice by Daughter.  ?Patient does have left side preference. She was not able to answer questions to me, only repeat ok.  ?-Tele-neuro consulted. Discussed case with Dr Rory Percy. See his note.  ?-Plan for Stat MRI.  ?-Needs transfer to [facility] for neurology evaluation and Continuous EEG.  ?-She was started ON keppra  and will received a dose of Ativan.  ?-Deferential : stroke vs seizure  ?Addendum; MRI showed edema left occipital parietal lobe, infarct vs cerebritis. Discussed with neurology plan to start IV antibiotics and IV antiviral to cover for meningitis, encephalitis.  ?I have contacted IR, plan for LP tomorrow.  ?Daughter updated.  ?Continue with IV  Keppra IV BID>  ?Hold lovenox for LP tomorrow.  ? ?2-HTN;  ?Continue with atenolol and Norvasc.   ?Started  low-dose hydralazine.  ? ?3-Dementia: Replete B12.  Continue with Namenda ? ?4-UTI: Continue with ceftriaxone, increase dose to 2 gram.  ?Continue with IV fluids.  ?Urine culture growing E. Coli sensitive to ceftriaxone.  ?Patient urine more cloudy today, plan to increase dose of ceftriaxone to 2 gram, IV bolus, check renal US.  ? ?5-CKD stage IIIb: Monitor renal function. ?Prior cr range 1,5  ?Cr down to 1. 3  ? ?6-aphasia secondary to  TBI, amnestic MCI, depression ?Continue with donezepil.  Replete B12 ? ?History of multiple stroke: Continue with aspirin ? ?Left kidney mass: She was referred to urology ? ?B 12 deficiency. Started  B 12 supplement.  ? ?Estimated body mass index is 27.81 kg/m? as calculated from the following: ?  Height as of 06/02/21: '5\' 3"'  (1.6 m). ?  Weight as of 09/02/21: 71.2 kg. ? ? ?DVT prophylaxis: Lovenox ?Code Status: Full Code ?Family Communication: Daughter at bedside ?Disposition Plan:  ?Status is: Observation ?The patient will require care spanning > 2 midnights and should be moved to inpatient because: management UTI, B 12 deficiency BP management ? ? ? ?Consultants:  ?[place] neurology  ? ?Procedures:  ?EEG ? ?Antimicrobials:  ?Ceftriaxone.  ? ?  Subjective: ?Patient with worsening MS, worsening aphasia, only repeat " ok". ?Left sideward gaze to left preference.  ? ?Objective: ?Vitals:  ? 09/23/21 0600 09/23/21 1227 09/23/21 1948 09/24/21 0505  ?BP: (!) 163/79 (!) 151/83 (!) 150/70 (!) 152/94  ?Pulse: 63 66 68 65  ?Resp: '14 16 17 17  ' ?Temp:  97.6 ?F (36.4 ?C) 98.6 ?F (37 ?C) 98.8 ?F (37.1 ?C) 98.6 ?F (37 ?C)  ?TempSrc: Oral Oral Oral   ?SpO2: 100% 99% 100% 99%  ? ? ?Intake/Output Summary (Last 24 hours) at 09/24/2021 1139 ?Last data filed at 09/24/2021 1100 ?Gross per 24 hour  ?Intake 1857.44 ml  ?Output 1500 ml  ?Net 357.44 ml  ? ? ?There were no vitals filed for this visit. ? ?Examination: ? ?General exam: NAD ?Respiratory system: CTA ?Cardiovascular system: S 1, S 2 RRR ?Gastrointestinal system: BS present, soft, nt ?Central nervous system: alert, aphasic, moves upper extremities, left sideward preference.  ?Extremities: no edema ? ? ?Data Reviewed: I have personally reviewed following labs and imaging studies ? ?CBC: ?Recent Labs  ?Lab 09/22/21 ?0900 09/22/21 ?5643 09/23/21 ?0456  ?WBC 7.0  --  6.9  ?NEUTROABS 4.9  --   --   ?HGB 12.9 13.6 12.4  ?HCT 40.8 40.0 38.1  ?MCV 98.6  --  96.5  ?PLT 280  --  252  ? ? ?Basic Metabolic Panel: ?Recent Labs  ?Lab 09/22/21 ?0900 09/22/21 ?3295 09/23/21 ?0456 09/24/21 ?0500  ?NA 143 144 136 140  ?K 4.4 4.3 4.6 4.2  ?CL 111 109 109 110  ?CO2 25  --  19* 19*  ?GLUCOSE 106* 97 71 94  ?BUN '14 12 12 13  ' ?CREATININE 1.57* 1.50* 1.26* 1.33*  ?CALCIUM 9.8  --  8.8* 9.4  ? ? ?GFR: ?CrCl cannot be calculated (Unknown ideal weight.). ?Liver Function Tests: ?Recent Labs  ?Lab 09/22/21 ?0900  ?AST 10*  ?ALT 11  ?ALKPHOS 71  ?BILITOT 0.6  ?PROT 8.0  ?ALBUMIN 4.0  ? ? ?No results for input(s): LIPASE, AMYLASE in the last 168 hours. ?Recent Labs  ?Lab 09/22/21 ?0837  ?AMMONIA <10  ? ? ?Coagulation Profile: ?Recent Labs  ?Lab 09/22/21 ?0900  ?INR 1.1  ? ? ?Cardiac Enzymes: ?No results for input(s): CKTOTAL, CKMB, CKMBINDEX, TROPONINI in the last 168 hours. ?BNP (last 3 results) ?No results for input(s): PROBNP in the last 8760 hours. ?HbA1C: ?No results for input(s): HGBA1C in the last 72 hours. ?CBG: ?Recent Labs  ?Lab 09/22/21 ?0836  ?GLUCAP 90  ? ? ?Lipid Profile: ?No results for input(s): CHOL, HDL, LDLCALC, TRIG, CHOLHDL,  LDLDIRECT in the last 72 hours. ?Thyroid Function Tests: ?No results for input(s): TSH, T4TOTAL, FREET4, T3FREE, THYROIDAB in the last 72 hours. ?Anemia Panel: ?Recent Labs  ?  09/23/21 ?0708  ?[id number] 143*  ? ? ?Sepsis Labs: ?No results for input(s): PROCALCITON, LATICACIDVEN in the last 168 hours. ? ?Recent Results (from the past 240 hour(s))  ?Resp Panel by RT-PCR (Flu A&B, Covid) Nasopharyngeal Swab     Status: None  ? Collection Time: 09/22/21  8:37 AM  ? Specimen: Nasopharyngeal Swab; Nasopharyngeal(NP) swabs in vial transport medium  ?Result Value Ref Range Status  ? SARS Coronavirus 2 by RT PCR NEGATIVE NEGATIVE Final  ?  Comment: (NOTE) ?SARS-CoV-2 target nucleic acids are NOT DETECTED. ? ?The SARS-CoV-2 RNA is generally detectable in upper respiratory ?specimens during the acute phase of infection. The lowest ?concentration of SARS-CoV-2 viral copies this assay can detect is ?138 copies/mL. A negative result  does not preclude SARS-Cov-2 ?infection and should not be used as the sole basis for treatment or ?other patient management decisions. A negative result may occur with  ?improper specimen collection/handling, submission of specimen other ?than nasopharyngeal swab, presence of viral mutation(s) within the ?areas targeted by this assay, and inadequate number of viral ?copies(<138 copies/mL). A negative result must be combined with ?clinical observations, patient history, and epidemiological ?information. The expected result is Negative. ? ?Fact Sheet for Patients:  ?EntrepreneurPulse.com.au ? ?Fact Sheet for Healthcare Providers:  ?IncredibleEmployment.be ? ?This test is no t yet approved or cleared by the [place] FDA and  ?has been authorized for detection and/or diagnosis of SARS-CoV-2 by ?FDA under an Emergency Use Authorization (EUA). This EUA will remain  ?in effect (meaning this test can be used) for the duration of the ?COVID-19 declaration under Section  564(b)(1) of the Act, 21 ?U.S.C.section 360bbb-3(b)(1), unless the authorization is terminated  ?or revoked sooner.  ? ? ?  ? Influenza A by PCR NEGATIVE NEGATIVE Final  ? Influenza B by PCR NEGATIVE NEGATI

## 2021-09-24 NOTE — Progress Notes (Signed)
Pharmacy Antibiotic Note ? ?[name] is a 76 y.o. female with breast cancer and hx CVA, aphasia and TBI presented to the ED  on 09/22/2021 with c/o AMS, difficulty walking and speaking. Brain MRI on 09/22/21 showed no acute infarction. However, she continues to be aphasic and has leftward gaze. Repeat brain MRI on 09/24/21 showed findings with concern for ischemic infarction or cerebritis like herpes encephalitis. Pharmacy has been consulted to dose vancomycin, acyclovir, ampicillin, and ceftriaxone for suspected meningitis/encephalitis. ? ?- scr 1.33 (crcl~35) ? ?Plan: ?- vancomycin 1500 mg IV x1, then 1000 mg q24h for goal trough 15-20 ?- change ceftriaxone to 2gm q12h ?- ampicillin 2gm q6h for crcl 30-49 ?- acyclovir 10 mg/kg q12h for crcl 25-50 ?- monitor renal function closely ?______________________________________ ? ?Temp (24hrs), Avg:98.3 ?F (36.8 ?C), Min:97.4 ?F (36.3 ?C), Max:98.8 ?F (37.1 ?C) ? ?Recent Labs  ?Lab 09/22/21 ?0900 09/22/21 ?0938 09/23/21 ?0456 09/24/21 ?0500  ?WBC 7.0  --  6.9 7.6  ?CREATININE 1.57* 1.50* 1.26* 1.33*  ?  ?CrCl cannot be calculated (Unknown ideal weight.).   ? ?Allergies  ?Allergen Reactions  ? Ace Inhibitors Other (See Comments)  ?  UNK reaction  ? Lisinopril Other (See Comments) and Cough  ?  Throat itching  ? ? ? ?Thank you for allowing pharmacy to be a part of this patient?s care. ? ?Lynelle Doctor ?09/24/2021 3:35 PM ? ?

## 2021-09-24 NOTE — Progress Notes (Addendum)
During assessment pt.urine output appears to be milky looking in contrast to her previous output which is amber clear. Daughter is also concern about new onset of neglect on the right side. MD notified while at bedside, new orders placed. Neuro consulted. Rapid response called at bedside. And stat MRI ordered. Patient is closely monitored ?

## 2021-09-24 NOTE — Progress Notes (Addendum)
[facility] Telemedicine Consult Follow Up Note ? ? ?Requesting Provider: Dr Tyrell Antonio ?Consult Participants: Dr. Kevan Rosebush RN, Anderson Malta RN 4N, Dr. Tyrell Antonio ?Location of the provider: [place] ?Location of the patient: [name] [facility] hospital bed [phone] ? ?This consult was provided via telemedicine with 2-way video and audio communication. The patient/family was informed that care would be provided in this way and agreed to receive care in this manner.  ? ? ?Chief Complaint: Altered mental status, aphasia, leftward gaze preference ? ?Interval history ?Patient was seen by me 2 days ago as an acute code stroke due to confusion, speech difficulty and difficulty walking-see the consult note from 09/22/2021. ?I got a call from the hospitalist that patient continues to be aphasic and has leftward gaze preference. ?I requested that the cart be taken to the patient's room so that I can examine her-just based on the exam findings offered to move by the hospitalist, seizure versus stroke are in the differentials and she will need repeat MRI imaging as well as possibly continuous EEG.  She will need transfer to [facility] returns our comprehensive neurological center. ?I was able to see her on camera with rapid response RN and bedside RN along with Dr. Abe People in the room. ?Her symptoms that brought her to the hospital on 09/22/2021 have not resolved since in the last known well remains from prior to presentation to the hospital which was 1830 hrs. on 09/21/2021. ?Detailed exam below ? ? ?Past Medical History:  ?Diagnosis Date  ? Anemia   ? 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 Arthralgia   ? 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 Breast cancer ([place])   ? Breast cancer, stage 1 ([place]) 09/09/2015  ? Chronic pain of left knee   ? Degenerative joint disease involving multiple joints 03/29/2011  ? Diabetes mellitus without complication ([place])   ? Patient reports she hasnt had  Diabetes in years  ? DJD (degenerative joint disease)   ? E. coli gastroenteritis   ? GERD (gastroesophageal reflux disease)   ? Herpes simplex esophagitis 2006  ? History of blood transfusion   ? with first pregnancy  ? History of multiple myeloma   ? chemo XRT, Dr. Marin Olp  ? History of radiation therapy 03/16/16-05/02/16  ? right breast 50.4 Gy in 28 fractions, boost to 60.4 Gy in 5 fractions  ? 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 HTN (hypertension) 2005  ? Hyperlipidemia   ? Myeloma ([place]) 03/28/2011  ? OA (osteoarthritis)   ? Paget's bone disease   ? Personal history of adenomatous colonic polyps 11/02/2011  ? 2 diminutive serrated adenomas 10/2011 Repeat colonoscopy 2018 (change from original recall based upon current guidelines 12/07/2014)    ? PONV (postoperative nausea and vomiting)   ? Radiation 09/28/2006 - 10/11/2006  ? skull treated to 1980 cGy by Dr. Elba Barman  ? Renal insufficiency   ? H/O resolved  ? Scalp laceration 06/05/2019  ? Stroke [facility])   ? TBI (traumatic brain injury) ([place]) 06/23/2019  ? Vitamin D deficiency   ? ? ? ?Current Facility-Administered Medications:  ?  0.9 %  sodium chloride infusion, , Intravenous, Continuous, Regalado, [place] A, MD, Last Rate: 100 mL/hr at 09/24/21 1148, Rate Change at 09/24/21 1148 ?  acetaminophen (TYLENOL) tablet 650 mg, 650 mg, Oral, Q6H PRN, 650 mg at 09/24/21 0932 **OR** acetaminophen (TYLENOL) suppository 650 mg, 650  mg, Rectal, Q6H PRN, Reubin Milan, MD ?  amLODipine [facility]) tablet 10 mg, 10 mg, Oral, Daily, Reubin Milan, MD, 10 mg at 09/24/21 0932 ?  aspirin EC tablet 81 mg, 81 mg, Oral, Daily, Reubin Milan, MD, 81 mg at 09/24/21 0932 ?  atenolol (TENORMIN) tablet 100 mg, 100 mg, Oral, Daily, Reubin Milan, MD, 100 mg at 09/24/21 0932 ?  cefTRIAXone (ROCEPHIN) 2 g in sodium chloride 0.9 % 100 mL IVPB, 2 g, Intravenous, Q24H, Regalado, Belkys A, MD ?  cyanocobalamin  ((VITAMIN B-12)) injection 1,000 mcg, 1,000 mcg, Intramuscular, Daily, Regalado, Belkys A, MD, 1,000 mcg at 09/24/21 0936 ?  donepezil (ARICEPT) tablet 5 mg, 5 mg, Oral, QHS, Reubin Milan, MD, 5 mg at 09/23/21 2044 ?  doxylamine (Sleep) (UNISOM) tablet 25 mg, 25 mg, Oral, QHS PRN, Reubin Milan, MD, 25 mg at 09/23/21 2046 ?  enoxaparin (LOVENOX) injection 40 mg, 40 mg, Subcutaneous, Q24H, Reubin Milan, MD, 40 mg at 09/23/21 1759 ?  hydrALAZINE (APRESOLINE) injection 10 mg, 10 mg, Intravenous, Q6H PRN, Regalado, Belkys A, MD ?  hydrALAZINE (APRESOLINE) tablet 10 mg, 10 mg, Oral, BID, Regalado, Belkys A, MD, 10 mg at 09/24/21 0932 ?  lactated ringers bolus 500 mL, 500 mL, Intravenous, Once, Regalado, Belkys A, MD ?  levETIRAcetam (KEPPRA) 2,000 mg in sodium chloride 0.9 % 250 mL IVPB, 2,000 mg, Intravenous, Once, Amie Portland, MD ?  LORazepam (ATIVAN) injection 1 mg, 1 mg, Intravenous, Once, Amie Portland, MD ?  megestrol (MEGACE) tablet 40 mg, 40 mg, Oral, Daily, Reubin Milan, MD, 40 mg at 09/24/21 8341 ?  memantine [facility]) tablet 5 mg, 5 mg, Oral, Daily, Reubin Milan, MD, 5 mg at 09/24/21 9622 ?  ondansetron (ZOFRAN) tablet 4 mg, 4 mg, Oral, Q6H PRN **OR** ondansetron (ZOFRAN) injection 4 mg, 4 mg, Intravenous, Q6H PRN, Reubin Milan, MD ?  pantoprazole (PROTONIX) EC tablet 40 mg, 40 mg, Oral, Daily, Reubin Milan, MD, 40 mg at 09/24/21 0932 ?  rosuvastatin (CRESTOR) tablet 10 mg, 10 mg, Oral, Daily, Reubin Milan, MD, 10 mg at 09/24/21 0932 ?  traMADol Veat[facility]bon) tablet 25 mg, 25 mg, Oral, Q6H PRN, Reubin Milan, MD ? ?Time of evaluation on camera: 12:10 PM ? ?Exam: ?Vitals:  ? 09/24/21 0505 09/24/21 1100  ?BP: (!) 152/94 (!) 147/66  ?Pulse: 65 (!) 58  ?Resp: 17 17  ?Temp: 98.6 ?F (37 ?C) (!) 97.4 ?F (36.3 ?C)  ?SpO2: 99% 99%  ? ? ?General: Awake alert sitting in the chair in no distress ?Neurological exam ?She is awake, alert, says yes to all questions.   Unable to follow commands.  Unable to repeat.  Unable to name objects.  Unable to describe the pictures on the stroke cards on the bedside. ?Cranial nerve examination, pupils are equal round reactive, there is a strong leftward gaze preference which she is able to break and looks somewhat to the midline and to the right but continues to prefer the left, difficult to assess visual fields, face is symmetric. ?Motor examination with no drift in bilateral upper extremities.  Both lower extremities are symmetrically weak and difficult to examine due to the positioning on the chair but no focality noted on camera. ?Difficult to assess coordination. ?1a Level of Conscious.: 0 ?1b LOC Questions: 2 ?1c LOC Commands: 2 ?2 Best Gaze: 1 ?3 Visual: 0 ?4 Facial Palsy: 0 ?5a Motor Arm - left: 0 ?5b Motor Arm - Right:  0 ?6a Motor Leg - Left: 1 ?6b Motor Leg - Right: 1 ?7 Limb Ataxia: 0 ?8 Sensory: 0 ?9 Best Language: 2 ?10 Dysarthria: 0 ?11 Extinct. and Inatten.: 2 ?TOTAL: 11 ? ? ? ?Imaging Reviewed: CT and MRI done at admission negative for acute process. ? ?Labs reviewed in epic and pertinent values follow: ?CBC ?   ?Component Value Date/Time  ? WBC 7.6 09/24/2021 0500  ? RBC 4.24 09/24/2021 0500  ? HGB 13.6 09/24/2021 0500  ? HGB 12.9 09/02/2021 0835  ? HGB 12.3 11/28/2016 0908  ? HGB 12.0 10/07/2007 0834  ? HCT 41.2 09/24/2021 0500  ? HCT 37.3 11/28/2016 0908  ? HCT 36.0 10/07/2007 0834  ? PLT 280 09/24/2021 0500  ? PLT 290 09/02/2021 0835  ? PLT 254 11/28/2016 0908  ? PLT 268 10/07/2007 0834  ? MCV 97.2 09/24/2021 0500  ? MCV 99 11/28/2016 0908  ? MCV 92.7 10/07/2007 0834  ? MCH 32.1 09/24/2021 0500  ? MCHC 33.0 09/24/2021 0500  ? RDW 13.4 09/24/2021 0500  ? RDW 13.6 11/28/2016 0908  ? RDW 13.5 10/07/2007 0834  ? LYMPHSABS 1.3 09/22/2021 0900  ? LYMPHSABS 1.6 11/28/2016 0908  ? LYMPHSABS 1.3 10/07/2007 0834  ? MONOABS 0.6 09/22/2021 0900  ? MONOABS 0.6 10/07/2007 0834  ? EOSABS 0.1 09/22/2021 0900  ? EOSABS 0.1 11/28/2016 0908   ? BASOSABS 0.0 09/22/2021 0900  ? BASOSABS 0.0 11/28/2016 0908  ? BASOSABS 0.0 10/07/2007 0834  ? ?CMP  ?   ?Component Value Date/Time  ? NA 140 09/24/2021 0500  ? NA 145 (H) 10/03/2019 1423  ? NA 141 07/10/2

## 2021-09-24 NOTE — Progress Notes (Signed)
Looks like [name] has a E. coli urinary tract infection.  She is on antibiotics which should cover this. ? ?She still has the confusion.  She had an EEG yesterday.  Neurology is following her closely. ? ?I do not see any evidence that we are dealing with her history of myeloma, history of breast cancer or this new problem with the renal mass.  There is no evidence of metastasis. ? ?I do not know she needs a spinal tap to see if there is any type of meningeal issues.  The MRI that she had done did not show any obvious meningeal enhancement or thickening. ? ?Her BUN is 13 creatinine 1.33.  Her sodium is 140.  Calcium 9.4. ? ?Her vitamin B12 is on the low side at 143.  I cannot imagine that this would be causing a change in mental state.  She has been started on vitamin B-12. ? ?Her vital signs show temperature 98.6.  Pulse 65.  Blood pressure 152/94.  Her lungs are clear bilaterally.  Cardiac exam regular rate and rhythm.  Abdomen is soft.  She has no guarding or rebound tenderness.  Neurological exam shows no focal deficits.  Again she has some disorientation. ? ?Hopefully, [name] will come out of this mental state change.  Hopefully, with treatment of the E. coli, this will improve her mental status. ? ?Again I do not see any malignancy that is involved. ? ?She needs to have Urology see her for the renal mass but there is nothing that they we will do for her right now while she is infected. ? ?We will continue to follow along. ? ?[name],. [name] ? ?Psalm 56:3 ?

## 2021-09-24 NOTE — Progress Notes (Signed)
Pt. Transferred to [facility] as per Neurology recommendation. Family at bedside. Report called to primary nurse in receiving unit. Report given to [facility]. Patient being transported on the way to [facility]. All patient personal belongings are with the family. ?

## 2021-09-24 NOTE — Evaluation (Signed)
Clinical/Bedside Swallow Evaluation ?Patient Details  ?Name: [name] [place] ?MRN: [id number] ?Date of Birth: December 08, 1945 ? ?Today's Date: 09/24/2021 ?Time: SLP Start Time (ACUTE ONLY): 7253 SLP Stop Time (ACUTE ONLY): 6644 ?SLP Time Calculation (min) (ACUTE ONLY): 20 min ? ?Past Medical History:  ?Past Medical History:  ?Diagnosis Date  ? Anemia   ? 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 Arthralgia   ? 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 Breast cancer ([place])   ? Breast cancer, stage 1 ([place]) 09/09/2015  ? Chronic pain of left knee   ? Degenerative joint disease involving multiple joints 03/29/2011  ? Diabetes mellitus without complication ([place])   ? Patient reports she hasnt had Diabetes in years  ? DJD (degenerative joint disease)   ? E. coli gastroenteritis   ? GERD (gastroesophageal reflux disease)   ? Herpes simplex esophagitis 2006  ? History of blood transfusion   ? with first pregnancy  ? History of multiple myeloma   ? chemo XRT, Dr. Marin Olp  ? History of radiation therapy 03/16/16-05/02/16  ? right breast 50.4 Gy in 28 fractions, boost to 60.4 Gy in 5 fractions  ? 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 HTN (hypertension) 2005  ? Hyperlipidemia   ? Myeloma ([place]) 03/28/2011  ? OA (osteoarthritis)   ? Paget's bone disease   ? Personal history of adenomatous colonic polyps 11/02/2011  ? 2 diminutive serrated adenomas 10/2011 Repeat colonoscopy 2018 (change from original recall based upon current guidelines 12/07/2014)    ? PONV (postoperative nausea and vomiting)   ? Radiation 09/28/2006 - 10/11/2006  ? skull treated to 1980 cGy by Dr. Elba Barman  ? Renal insufficiency   ? H/O resolved  ? Scalp laceration 06/05/2019  ? Stroke [facility])   ? TBI (traumatic brain injury) ([place]) 06/23/2019  ? Vitamin D deficiency   ? ?Past Surgical History:  ?Past Surgical History:  ?Procedure Laterality Date  ? 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 [facility],  chemo tx at [facility] long-2006, radiation in 2008  ? BREAST LUMPECTOMY Right 11/2015  ? radiation and chemo  ? BREAST LUMPECTOMY WITH RADIOACTIVE SEED AND SENTINEL LYMPH NODE BIOPSY Right 09/22/2015  ? Procedure: BREAST LUMPECTOMY WITH RADIOACTIVE SEED AND SENTINEL LYMPH NODE BIOPSY;  Surgeon: Excell Seltzer, MD;  Location: [place];  Service: General;  Laterality: Right;  ? BUBBLE STUDY  10/07/2019  ? Procedure: BUBBLE STUDY;  Surgeon: Adrian Prows, MD;  Location: [place];  Service: Cardiovascular;;  ? COLONOSCOPY    ? IR REMOVAL TUN ACCESS W/ PORT W/O FL MOD SED  05/23/2018  ? JOINT REPLACEMENT Left   ? KNEE ARTHROSCOPY  05/2008  ? left  ? TEE WITHOUT CARDIOVERSION N/A 10/07/2019  ? Procedure: TRANSESOPHAGEAL ECHOCARDIOGRAM (TEE);  Surgeon: Adrian Prows, MD;  Location: [place];  Service: Cardiovascular;  Laterality: N/A;  ? TOTAL KNEE ARTHROPLASTY  10/11/2010  ? left  ? ?HPI:  ?PT is a 76 y.o. female with medical history significant of anemia, osteoarthritis, breast cancer, multiple myeloma, history of radiation therapy, , type II DM, E. 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  CT head and MRI brain negative for acute changes.  Daughter admits pt had language deficits after  her TBI.  Pt did not pass 3 ounce Yale swallow screen, thus SLP swallow eval ordered.    [name], daughter, reports prior to admit, pt was holding food in her mouth and her appetite has been poor. Patient evaluated for swallow by SLP on 09/23/21 and Dys 3 (mechanical soft) solids and thin liquids recommended with no f/u recommended. On 09/24/21, rapid response called and stat MRI ordered secondary to appearance of new onset stroke symptoms. Repeat brain MRI on 09/24/21 showed findings with concern for ischemic infarction or cerebritis like herpes  encephalitis. (Brain MRI on 09/22/21 showed no acute infarction). Patietn made NPO awaiting SLP re-evaluation of swallow.  ?  ?Assessment / Plan / Recommendation  ?Clinical Impression ? Patient continues to present with a suspected cognitive-based dysphagia. During today's evaluation, patient able to hold double-handled cup but required tactile cues to bring lip of cup to mouth to take a sip. She consumed only a  3-4 sips of water via Provale cup (10 cc) before handing to SLP and seeming to indicate she did not want it anymore. Her speech was dysarthric, weak, low in intensity and SLP judged speech to be about 10-15% intelligible. Patient was very fidgety with her hands which daughter reported has been her baseline of late. Daughter also reported that she had patient move in with her about 2 weeks ago as she was having concerns about her cognition and that patient had diagnosis of "early dementia". SLP did not observe any overt s/s of aspiration or penetration during this limited assessment. Recommendation is for return to Dys 3 (mechanical soft) solids diet, thin liquids. SLP will follow patient for toleration. ?SLP Visit Diagnosis: Dysphagia, unspecified (R13.10) ?   ?Aspiration Risk ? Mild aspiration risk  ?  ?Diet Recommendation Dysphagia 3 (Mech soft);Thin liquid  ? ?Medication Administration: Crushed with puree ?Supervision: Staff to assist with self feeding;Full supervision/cueing for compensatory strategies ?Compensations: Slow rate;Small sips/bites ?Postural Changes: Seated upright at 90 degrees  ?  ?Other  Recommendations Oral Care Recommendations: Staff/trained caregiver to provide oral care;Oral care BID   ? ?Recommendations for follow up therapy are one component of a multi-disciplinary discharge planning process, led by the attending physician.  Recommendations may be updated based on patient status, additional functional criteria and insurance authorization. ? ?Follow up Recommendations Home health SLP   ? ? ?  ?Assistance Recommended at Discharge Frequent or constant Supervision/Assistance  ?Functional Status Assessment Patient has had a recent decline in their functional status and demonstrates the ability to make significant improvements in function in a reasonable and predictable amount of time.  ?Frequency and Duration min 2x/week  ?1 week ?  ?   ? ?Prognosis Prognosis for Safe Diet Advancement: Good ?Barriers to Reach Goals: Cognitive deficits  ? ?  ? ?Swallow Study   ?General Date of Onset: 09/24/21 ?HPI: PT is a 76 y.o. female with medical history significant of anemia, osteoarthritis, breast cancer, multiple myeloma, history of radiation therapy, , type II DM, E. 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  CT head and MRI brain negative for acute changes.  Daughter admits pt had language deficits after her TBI.  Pt did not pass 3 ounce Yale swallow screen, thus SLP swallow eval ordered.    [name], daughter, reports prior to admit, pt was holding food in her  mouth and her appetite has been poor. Patient evaluated for swallow by SLP on 09/23/21 and Dys 3 (mechanical soft) solids and thin liquids recommended with no f/u recommended. On 09/24/21, rapid response called and stat MRI ordered secondary to appearance of new onset stroke symptoms. Repeat brain MRI on 09/24/21 showed findings with concern for ischemic infarction or cerebritis like herpes encephalitis. (Brain MRI on 09/22/21 showed no acute infarction). Patietn made NPO awaiting SLP re-evaluation of swallow. ?Type of Study: Bedside Swallow Evaluation ?Previous Swallow Assessment: 09/23/21 ?Diet Prior to this Study: NPO ?Temperature Spikes Noted: No ?Respiratory Status: Room air ?History of Recent Intubation: No ?Behavior/Cognition: Alert;Confused;Distractible;Requires  cueing ?Oral Cavity Assessment: Other (comment) (patient did not follow commands for oral motor exam) ?Oral Care Completed by SLP: No ?Oral Cavity - Dentition: Adequate natural dentition ?Self-Feeding Abilit

## 2021-09-24 NOTE — Progress Notes (Signed)
LTM EEG hooked up and running - no initial skin breakdown - push button tested - neuro notified. [facility] monitoring.  

## 2021-09-25 ENCOUNTER — Inpatient Hospital Stay (HOSPITAL_COMMUNITY): Payer: Medicare Other

## 2021-09-25 DIAGNOSIS — R4701 Aphasia: Secondary | ICD-10-CM

## 2021-09-25 DIAGNOSIS — I6389 Other cerebral infarction: Secondary | ICD-10-CM

## 2021-09-25 DIAGNOSIS — E538 Deficiency of other specified B group vitamins: Secondary | ICD-10-CM

## 2021-09-25 DIAGNOSIS — G9341 Metabolic encephalopathy: Secondary | ICD-10-CM | POA: Diagnosis not present

## 2021-09-25 DIAGNOSIS — G9340 Encephalopathy, unspecified: Secondary | ICD-10-CM

## 2021-09-25 DIAGNOSIS — G3184 Mild cognitive impairment, so stated: Secondary | ICD-10-CM

## 2021-09-25 DIAGNOSIS — S098XXA Other specified injuries of head, initial encounter: Secondary | ICD-10-CM

## 2021-09-25 DIAGNOSIS — N390 Urinary tract infection, site not specified: Secondary | ICD-10-CM | POA: Diagnosis not present

## 2021-09-25 LAB — HEMOGLOBIN A1C
Hgb A1c MFr Bld: 5.4 % (ref 4.8–5.6)
Mean Plasma Glucose: 108.28 mg/dL

## 2021-09-25 LAB — ECHOCARDIOGRAM COMPLETE
Area-P 1/2: 2.91 cm2
S' Lateral: 2.9 cm
Weight: 2504.43 oz

## 2021-09-25 LAB — CSF CELL COUNT WITH DIFFERENTIAL
Eosinophils, CSF: 1 % (ref 0–1)
Lymphs, CSF: 44 % (ref 40–80)
Monocyte-Macrophage-Spinal Fluid: 2 % — ABNORMAL LOW (ref 15–45)
RBC Count, CSF: 18000 /mm3 — ABNORMAL HIGH
Segmented Neutrophils-CSF: 53 % — ABNORMAL HIGH (ref 0–6)
Tube #: 1
WBC, CSF: 17 /mm3 (ref 0–5)

## 2021-09-25 MED ORDER — LIDOCAINE HCL (PF) 1 % IJ SOLN
5.0000 mL | Freq: Once | INTRAMUSCULAR | Status: AC
  Administered 2021-09-25: 5 mL via INTRADERMAL

## 2021-09-25 MED ORDER — HALOPERIDOL LACTATE 5 MG/ML IJ SOLN
2.0000 mg | Freq: Once | INTRAMUSCULAR | Status: AC
  Administered 2021-09-25: 2 mg via INTRAVENOUS
  Filled 2021-09-25: qty 1

## 2021-09-25 MED ORDER — STROKE: EARLY STAGES OF RECOVERY BOOK
Freq: Once | Status: AC
  Filled 2021-09-25: qty 1

## 2021-09-25 MED ORDER — DEXTROSE 5 % IV SOLN
10.0000 mg/kg | Freq: Two times a day (BID) | INTRAVENOUS | Status: DC
  Administered 2021-09-25 – 2021-09-26 (×2): 710 mg via INTRAVENOUS
  Filled 2021-09-25 (×3): qty 14.2

## 2021-09-25 NOTE — Assessment & Plan Note (Deleted)
See above

## 2021-09-25 NOTE — Assessment & Plan Note (Addendum)
Vitamin B12 of 143. Continue vitamin B12 supplementation. ?

## 2021-09-25 NOTE — Progress Notes (Signed)
?  Echocardiogram ?2D Echocardiogram has been performed. ? ?[name] ?09/25/2021, 3:58 PM ?

## 2021-09-25 NOTE — Assessment & Plan Note (Signed)
No active disease, oncology have evaluated the patient ?

## 2021-09-25 NOTE — Assessment & Plan Note (Addendum)
Urine culture growing E. Coli.  Completed 5 days cephalosporin.  Afebrile, HR and WBC normal. ?

## 2021-09-25 NOTE — Procedures (Signed)
PROCEDURE SUMMARY: ? ?Technically successful fluoroscopy guided lumbar puncture. ?Yielded only 0.5 of pink tinged clear fluid.  Fluid did not easily flow beyond hub of needle ?Pt tolerated procedure well. ?No immediate complications. ? ?Specimen was sent for labs. ?Team informed of limitation. ? ?EBL < 5 mL ? ?Reynaldo Rossman PA-C ?09/25/2021 ?12:10 PM ? ? ? ?

## 2021-09-25 NOTE — Assessment & Plan Note (Addendum)
At baseline uses cane, independent with feeding/toileting, interactive and recognizes family members and engages in conversation. On presentation was confused, disoriented. 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 LP under fluoroscopy was traumatic, and low volume, but did not have a pleocytosis to suggest infection, so antibiotics were stopped. HSV PCR negative so acyclovir stopped. LTM EEG without evidence of seizures. Urine sample suggestive of UTI which could have been the cause of encephalopathy. UTI was treated successfully with antibiotics. Patient with improvement towards baseline. ?

## 2021-09-25 NOTE — Assessment & Plan Note (Addendum)
Slightly uncontrolled. Continue amlodipine, atenolol. ?

## 2021-09-25 NOTE — Hospital Course (Addendum)
[name] is a 76 y.o. F with brain injury with resulting cognitive impairment/dementia, diabetes, breast cancer, multiple myeloma, renal mass who presented with confusion. ? ? ?5/4: Admitted with aphasia, confusion, MRI brain normal ?5/5: EEG without definite seizure, remains confused ?5/6: Still no improvement, MRI brain repeated --> temporal lobe enhancement, Acyclovir and antibiotics started, transferred to [facility] for LP and cEEG ?5/7: LP traumatic tap ?5/8: Re-review of MRI suggests stroke, antibiotics stopped, HSV negative, acyclovir stopped, CT ordered ?

## 2021-09-25 NOTE — Progress Notes (Signed)
?  Progress Note ? ? ?PatientTroyce [name] [name] UXN:[id number] DOB: [birth year] DOA: 09/22/2021     2 ?DOS: the patient was seen and examined on 09/25/2021 at 9:55 AM ?  ? ? ? ?Brief [name] course: ?[name] is a 76 y.o. F with brain injury with resulting cognitive impairment/dementia, diabetes, breast cancer, multiple myeloma, renal mass who presented with confusion. ? ? ?5/4: Admitted with aphasia, confusion, MRI brain normal ?5/5: EEG without definite seizure, remains confused ?5/6: Still no improvement, MRI brain repeated --> temporal lobe enhancement, Acyclovir and antibiotics started, transferred to [name] for LP and cEEG ? ? ? ? ?Assessment and Plan: ?* Acute metabolic encephalopathy ?At baseline uses cane, independent with feeding/toileting, interactive and recognizes family members and engages in conversation. ? ?On presentation was confused, disoriented. ? ?MRI brain initially normal and there was a strong clinical suspicion for metabolic encephalopathy due to UTI.  Started on antibiotics, fluids. ? ?Over 48 hours, no improvement.  Continued to have aphasia and left-ward gaze preference.  Repeat MRI brain showed temporal and occipital lobe enhancement consistent with ischemia or infection.  Started on antibiotics, acyclovir and transferred to our [name] for comprehensive neurological evaluation ? ?- Contineu Rocephin, vancomycin, ampicillin ?- Continue acyclovir ?- LP ordered ?- Continue cEEG ? ?B12 deficiency ?- Continue vitamin B12 ? ?Hyperlipidemia ?- Continue Crestor ? ?Hypertension ?Blood pressure slightly elevated ?- Continue amlodipine, atenolol ? ?Left kidney mass ?- Outpatient urology follow-up ? ?Acute UTI (urinary tract infection) ?Urine culture growing E. coli ?- Already on antibiotics for possible meningitis ? ?History of multiple strokes ?- Continue Crestor, aspirin ? ?Aphasia with TBI (traumatic brain injury), closed ?  ? ?Amnestic MCI (mild cognitive impairment with memory loss) ?- Continue  donepezil, Megace, memantine ? ?Depression ?  ? ?Myeloma ([place]) ?No active disease, oncology have evaluated the patient ? ? ? ? ? ? ? ? ? ?Subjective: Patient is nonverbal.  Daughter at the bedside reports agitation, restlessness fairly continuous for the last 24 hours.  She had no fever, respiratory distress, vomiting. ? ? ? ? ?Physical Exam: ?Vitals:  ? 09/24/21 2026 09/25/21 0003 09/25/21 0550 09/25/21 2542  ?BP: (!) 152/69 130/65 (!) 151/74 130/88  ?Pulse: 61 (!) 57 61 76  ?Resp: _0 ?Temp: 98.4 ?F (36.9 ?C) 98.2 ?F (36.8 ?C) 98.2 ?F (36.8 ?C) 98.4 ?F (36.9 ?C)  ?TempSrc: Oral Oral Oral Oral  ?SpO2: 100% 100% 100% 100%  ?Weight:      ? ?Elderly adult female, eyes closed, not responsive to my stimuli ?RRR, no murmurs, no peripheral edema ?Respiratory rate normal, lungs clear without rales or wheezes ?Abdomen soft, grimace to palpation ?Attention diminished, does not respond to stimuli, makes no spontaneous verbalizations, resist cares, does not follow commands ? ?Data Reviewed: ?Nursing notes reviewed, vital signs reviewed ?Continuous EEG report reviewed, basic metabolic panel reviewed ?Renal ultrasound reviewed, results are indeterminate ?Complete blood count normal ?Vitamin B12 level normal low ? ?Family Communication: Daughter at the bedside ? ? ? ?Disposition: ?Status is: Inpatient ? ? ? ? ? ? ? ? ?Author: ?Edwin Dada, MD ?09/25/2021 10:07 AM ? ?For on call review [email or web address].  ? ? ?

## 2021-09-25 NOTE — Progress Notes (Signed)
LTM EEG discontinued - no skin breakdown at unhook.   

## 2021-09-25 NOTE — Evaluation (Signed)
Speech Language Pathology Evaluation ?Patient Details ?Name: [name] [place] ?MRN: [id number] ?DOB: 06-Oct-1945 ?Today's Date: 09/25/2021 ?Time: 6606-3016 ?SLP Time Calculation (min) (ACUTE ONLY): 15 min ? ?Problem List:  ?Patient Active Problem List  ? Diagnosis Date Noted  ? B12 deficiency 09/25/2021  ? Acute metabolic encephalopathy 05/30/3233  ? Acute UTI (urinary tract infection) 09/22/2021  ? Left kidney mass 09/22/2021  ? Hypertension 09/22/2021  ? Hyperlipidemia 09/22/2021  ? Occipital stroke ([place]) 12/19/2019  ? Aphasia with TBI (traumatic brain injury), closed 07/28/2019  ? Visual field contraction, left 07/28/2019  ? Aphasia as late effect of stroke 07/28/2019  ? History of multiple strokes 07/28/2019  ? Visual field constriction, left 06/23/2019  ? Intractable acute post-traumatic headache 06/23/2019  ? 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 TBI (traumatic brain injury) ([place]) 06/23/2019  ? Shuffling gait 06/23/2019  ? 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 Scalp laceration 06/05/2019  ? Lytic bone lesions on xray 03/14/2016  ? Anemia of chronic disease 12/14/2015  ? Stage 3b chronic kidney disease (CKD) ([place]) 12/01/2015  ? Anorexia 12/01/2015  ? Cough 12/01/2015  ? Breast cancer, stage 1 ([place]) 09/09/2015  ? 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 Depression 04/11/2012  ? Personal history of adenomatous colonic polyps 11/02/2011  ? Degenerative joint disease involving multiple joints 03/29/2011  ? Myeloma ([place]) 03/28/2011  ? ?Past Medical History:  ?Past Medical History:  ?Diagnosis Date  ? Anemia   ? 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 Arthralgia   ? 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 Breast cancer ([place])   ? Breast cancer, stage 1 ([place]) 09/09/2015  ? Chronic pain of left knee   ? Degenerative joint disease involving multiple joints  03/29/2011  ? Diabetes mellitus without complication ([place])   ? Patient reports she hasnt had Diabetes in years  ? DJD (degenerative joint disease)   ? E. coli gastroenteritis   ? GERD (gastroesophageal reflux disease)   ? Herpes simplex esophagitis 2006  ? History of blood transfusion   ? with first pregnancy  ? History of multiple myeloma   ? chemo XRT, Dr. Marin Olp  ? History of radiation therapy 03/16/16-05/02/16  ? right breast 50.4 Gy in 28 fractions, boost to 60.4 Gy in 5 fractions  ? 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 HTN (hypertension) 2005  ? Hyperlipidemia   ? Myeloma ([place]) 03/28/2011  ? OA (osteoarthritis)   ? Paget's bone disease   ? Personal history of adenomatous colonic polyps 11/02/2011  ? 2 diminutive serrated adenomas 10/2011 Repeat colonoscopy 2018 (change from original recall based upon current guidelines 12/07/2014)    ? PONV (postoperative nausea and vomiting)   ? Radiation 09/28/2006 - 10/11/2006  ? skull treated to 1980 cGy by Dr. Elba Barman  ? Renal insufficiency   ? H/O resolved  ? Scalp laceration 06/05/2019  ? Stroke [facility])   ? TBI (traumatic brain injury) ([place]) 06/23/2019  ? Vitamin D deficiency   ? ?Past Surgical History:  ?Past Surgical History:  ?Procedure Laterality Date  ? 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 [facility], [facility] tx at [facility] long-2006, radiation in 2008  ? BREAST LUMPECTOMY Right 11/2015  ? radiation and chemo  ? BREAST LUMPECTOMY WITH RADIOACTIVE SEED AND SENTINEL LYMPH NODE BIOPSY  Right 09/22/2015  ? Procedure: BREAST LUMPECTOMY WITH RADIOACTIVE SEED AND SENTINEL LYMPH NODE BIOPSY;  Surgeon: Excell Seltzer, MD;  Location: [place];  Service: General;  Laterality: Right;  ? BUBBLE STUDY  10/07/2019  ? Procedure: BUBBLE STUDY;  Surgeon: Adrian Prows, MD;  Location: [place];  Service: Cardiovascular;;  ? COLONOSCOPY    ? IR REMOVAL TUN ACCESS W/ PORT W/O FL MOD SED  05/23/2018  ? JOINT REPLACEMENT Left   ?  KNEE ARTHROSCOPY  05/2008  ? left  ? TEE WITHOUT CARDIOVERSION N/A 10/07/2019  ? Procedure: TRANSESOPHAGEAL ECHOCARDIOGRAM (TEE);  Surgeon: Adrian Prows, MD;  Location: [place];  Service: Cardiovascular;  Laterality: N/A;  ? TOTAL KNEE ARTHROPLASTY  10/11/2010  ? left  ? ?HPI:  ?PT is a 76 y.o. female with medical history significant of anemia, osteoarthritis, breast cancer, multiple myeloma, history of radiation therapy, , type II DM, E. 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  CT head and MRI brain negative for acute changes.  Daughter admits pt had language deficits after her TBI.  Pt did not pass 3 ounce Yale swallow screen, thus SLP swallow eval ordered.    [name], daughter, reports prior to admit, pt was holding food in her mouth and her appetite has been poor. Patient evaluated for swallow by SLP on 09/23/21 and Dys 3 (mechanical soft) solids and thin liquids recommended with no f/u recommended. On 09/24/21, rapid response called and stat MRI ordered secondary to appearance of new onset stroke symptoms. Repeat brain MRI on 09/24/21 showed findings with concern for ischemic infarction or cerebritis like herpes encephalitis. (Brain MRI on 09/22/21 showed no acute infarction but brain MRI on 5/7 showed "Swelling, increased T2 signal and restricted diffusion in the left occipital lobe and posterior temporal lobe, lateral more than medial. This does not appear to represent a typical posterior cerebral artery distribution. Although this could represent ischemic  infarction, consider cerebritis including herpes encephalitis".  ? ?Assessment / Plan / Recommendation ?Clinical Impression ? Patient presents with a severe cognitive-linguistic impairment and moderate flaccid dysarthria. Patient was slightly more interactive during today's  evaluation as compared to previous date, however she continues to perseverate on actions and verbalizations, fidgets and requires mod-maxA cues to initiate basic tasks such as holding a cup and bringing straw to lips to drink. She appears with strong gaze preference to left side and inattention/neglect of right side visual field. When she speaks, speech is dysarthric and language content is that of confusion and perseveration, such as after SLP asking her how she feels, patient repeating, "she doesn't feel...." but unable to respond to basic yes/no or follow basic commands without hand over hand assist/cues. Patient not demonstrating that she recognizes family members in room and does not make eye contact with any speaker. SLP is recommending acute level SLP treatment for basic level cognitive-linguistic and speech function. ?   ?SLP Assessment ? SLP Recommendation/Assessment: Patient needs continued [place] [facility] [facility] ?SLP Visit Diagnosis: Cognitive communication deficit (R41.841);Dysarthria and anarthria (R47.1)  ?  ?Recommendations for follow up therapy are one component of a multi-disciplinary discharge planning process, led by the attending physician.  Recommendations may be updated based on patient status, additional functional criteria and insurance authorization. ?   ?Follow Up Recommendations ? Other (comment) (pending PT and OT  recs ([place] vs SNF))  ?  ?Assistance Recommended at Discharge ? Frequent or constant Supervision/Assistance  ?Functional Status Assessment Patient has had a recent decline in their functional status and demonstrates the ability to make significant improvements in function in a reasonable and predictable amount of time.  ?Frequency and Duration min 2x/week  ?2 weeks ?  ?   ?SLP Evaluation ?Cognition ? Overall Cognitive Status: Impaired/Different from baseline ?Arousal/Alertness: Awake/alert ?Orientation Level: Disoriented X4 ?Attention: Focused ?Focused Attention:  Impaired ?Focused Attention Impairment: Functional basic;Verbal basic ?Behaviors: Restless;Perseveration ?Safety/Judgment: Impaired  ?  ?   ?Comprehension ? Auditory Comprehension ?Overall Auditory Comprehensi

## 2021-09-25 NOTE — Progress Notes (Addendum)
Neurology Progress Note ? ? ?S:// ?Patient is laying in bed connected to LTM asleep in NAD. Patients daughter is at the bedside. Daughter states that she patient is normally alert and oriented, walks with a walker and is able to take care of herself for the most part. Daughter had noticed some changes in memory and ability so has recently moved patient into her house in the last 2 weeks. She states that on Thursday morning in the morning she noticed that her mom was confused, aphasia and had trouble walking so she brought her the the hospital for evaluation. Yesterday, daughter noticed her mom having a leftward gaze and right side neglect. MRI brain 5/6 revealed Swelling, increased T2 signal and restricted diffusion in the left occipital lobe and posterior temporal lobe, lateral more than medial. She was loaded with Keppra '2000mg'$  and started on '500mg'$  BID. EEG on 5/5 Continuous slow, generalized and lateralized left hemisphere, No seizures or epileptiform discharges were seen. She was transferred to [facility] for LTM and furhter workup ? ? ?O:// ?Current vital signs: ?BP 130/88 (BP Location: Right Arm)   Pulse 76   Temp 98.4 ?F (36.9 ?C) (Oral)   Resp 17   Wt 71 kg   LMP 05/22/2000   SpO2 100%   BMI 27.73 kg/m?  ?Vital signs in last 24 hours: ?Temp:  [98.2 ?F (36.8 ?C)-98.5 ?F (36.9 ?C)] 98.4 ?F (36.9 ?C) (05/07 6789) ?Pulse Rate:  [57-76] 76 (05/07 0923) ?Resp:  [17-20] 17 (05/07 3810) ?BP: (130-155)/(65-88) 130/88 (05/07 1751) ?SpO2:  [100 %] 100 % (05/07 0923) ?Weight:  [71 kg] 71 kg (05/06 1500) ? ?GENERAL: asleep in bed in NAD ?HEENT: - Normocephalic and atraumatic, dry mm ?LUNGS - Clear to auscultation bilaterally with no wheezes ?CV - S1S2 RRR, no m/r/g, equal pulses bilaterally. ?ABDOMEN - Soft, nontender, nondistended with normoactive BS ?Ext: warm, well perfused, intact peripheral pulses, no edema ? ?NEURO:  ?Mental Status: She awakens easily to voice, she does not follow commands. She says "Yes" to all  questions asked. Unable to repeat or name objects. She moves everything spontaneously  ?Language: speech is clear from the limited speech.   ?Cranial Nerves: PERRL 2 mm/brisk. EOMI, visual fields full, no facial asymmetry, facial sensation intact, hearing intact, tongue/uvula/soft palate midline, normal sternocleidomastoid and trapezius muscle strength. No evidence of tongue atrophy or fibrillations ?Motor: 4/4 in all 4 extremities. All antigravity and move spontaneously  ?Tone: is normal and bulk is normal ?Sensation- responds to noxious stimuli ?Coordination: Unable to assess ?Gait- deferred ? ? ?Medications ? ?Current Facility-Administered Medications:  ?  0.9 %  sodium chloride infusion, , Intravenous, Continuous, Regalado, Belkys A, MD, Last Rate: 100 mL/hr at 09/24/21 1148, Rate Change at 09/24/21 1148 ?  acetaminophen (TYLENOL) tablet 650 mg, 650 mg, Oral, Q6H PRN, 650 mg at 09/24/21 0932 **OR** acetaminophen (TYLENOL) suppository 650 mg, 650 mg, Rectal, Q6H PRN, Regalado, Belkys A, MD ?  acyclovir (ZOVIRAX) 710 mg in dextrose 5 % 150 mL IVPB, 10 mg/kg, Intravenous, Q12H, Pham, Anh P, RPH, Last Rate: 164.2 mL/hr at 09/25/21 0923, 710 mg at 09/25/21 0923 ?  amLODipine (NORVASC) tablet 10 mg, 10 mg, Oral, Daily, Regalado, Belkys A, MD, 10 mg at 09/25/21 0927 ?  ampicillin (OMNIPEN) 2 g in sodium chloride 0.9 % 100 mL IVPB, 2 g, Intravenous, Q6H, Pham, Anh P, RPH, Last Rate: 300 mL/hr at 09/25/21 0814, 2 g at 09/25/21 0258 ?  aspirin EC tablet 81 mg, 81 mg, Oral, Daily, Regalado,  Belkys A, MD, 81 mg at 09/24/21 0932 ?  atenolol (TENORMIN) tablet 100 mg, 100 mg, Oral, Daily, Regalado, Belkys A, MD, 100 mg at 09/25/21 0928 ?  cefTRIAXone (ROCEPHIN) 2 g in sodium chloride 0.9 % 100 mL IVPB, 2 g, Intravenous, Q12H, Pham, Anh P, RPH, Last Rate: 200 mL/hr at 09/25/21 1036, 2 g at 09/25/21 1036 ?  cyanocobalamin ((VITAMIN B-12)) injection 1,000 mcg, 1,000 mcg, Intramuscular, Daily, Regalado, Belkys A, MD, 1,000 mcg at  09/25/21 0930 ?  donepezil (ARICEPT) tablet 5 mg, 5 mg, Oral, QHS, Regalado, Belkys A, MD, 5 mg at 09/24/21 2046 ?  doxylamine (Sleep) (UNISOM) tablet 25 mg, 25 mg, Oral, QHS PRN, Regalado, Belkys A, MD, 25 mg at 09/23/21 2046 ?  levETIRAcetam (KEPPRA) tablet 500 mg, 500 mg, Oral, BID, Regalado, Belkys A, MD, 500 mg at 09/25/21 0928 ?  megestrol (MEGACE) tablet 40 mg, 40 mg, Oral, Daily, Regalado, Belkys A, MD, 40 mg at 09/25/21 0927 ?  memantine (NAMENDA) tablet 5 mg, 5 mg, Oral, Daily, Regalado, Belkys A, MD, 5 mg at 09/25/21 0927 ?  ondansetron (ZOFRAN) tablet 4 mg, 4 mg, Oral, Q6H PRN **OR** ondansetron (ZOFRAN) injection 4 mg, 4 mg, Intravenous, Q6H PRN, Regalado, Belkys A, MD ?  pantoprazole (PROTONIX) EC tablet 40 mg, 40 mg, Oral, Daily, Regalado, Belkys A, MD, 40 mg at 09/24/21 0932 ?  rosuvastatin (CRESTOR) tablet 10 mg, 10 mg, Oral, Daily, Regalado, Belkys A, MD, 10 mg at 09/25/21 1029 ?  vancomycin (VANCOCIN) IVPB 1000 mg/200 mL premix, 1,000 mg, Intravenous, Q24H, Pham, Anh P, RPH ? ?LABS:  ?WBC 7.6 ?NA 140 ?Glucose 94 ?BUN 13 ?Cr 1.33 ?Vit B 12 143 ? ?Imaging ?I have reviewed images in epic and the results pertinent to this consultation are: ? ?Code Stroke CT-scan of the brain 5/4: ?1. No evidence of acute large vascular territory infarct or acute hemorrhage. ASPECTS is 10. ?2. Moderate to advanced chronic microvascular ischemic disease. A small acute infarct could be easily obscured and MRI could provide more sensitive evaluation for acute infarct if clinically warranted. ? ?Code stroke CTA H/N w Perf 5/4: ?1. Occlusion versus severe stenosis of the proximal left P2 PCA with poor distal opacification. ?2. FAPID detects an approximately 22 mL of penumbra in the left PCA territory without evidence of core infarct. ?3. Severe right P2 PCA stenosis. ?4. Moderate stenosis of bilateral M2 MCA branches and right A2 ACA. ?5. Bilateral carotid bifurcation atherosclerosis without greater than 50%  stenosis. ? ?MRI examination of the brain 5/4: ?No acute infarction ? ?MRI examination of the brain 5/6: ?Swelling, increased T2 signal and restricted diffusion in the left occipital lobe and posterior temporal lobe, lateral more than medial. This does not appear to represent a typical posterior cerebral artery distribution. Although this could represent ischemic ?infarction, consider cerebritis including herpes encephalitis ? ?rEEG 5/5: ?This study is suggestive of cortical dysfunction arising from left hemisphere likely secondary to underlying structural abnormality. Additionally there is moderate diffuse encephalopathy, nonspecific etiology. No seizures or epileptiform discharges were seen throughout the recording ? ?EEG 5/6-5/7: ?This study is suggestive of cortical dysfunction arising from left hemisphere likely secondary to underlying structural abnormality. Additionally there is moderate diffuse encephalopathy, nonspecific etiology. No seizures or epileptiform discharges were seen throughout the recording. ? ?Assessment:  ?76 year old woman with multiple malignancies and multiple cerebrovascular risk factors as well as history of TBI presented for evaluation of confusion and difficulty with speech and gait difficulty 2 days ago.  Was seen  as a Code stroke.  CT and MRI brain negative. Neurology reconsulted for new onset of left gaze and right side neglect ? ? 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 ?Recommendations: ?- LP under Fluoro today.  ?- Contine abx for meningitis coverage ?- CSF labs to be sent when obtained  cell count, glucose, protein, HSV PCR, Gram stain, VZV PCR.   ?- Will D/c LTM  ?- Delirium precautions  ?-Stroke workup to include: ?- HgbA1c, fasting lipid panel ?- Frequent neuro checks ?- Echocardiogram ?- Prophylactic therapy-Antiplatelet med: Aspirin - dose '81mg'$   ?- Risk factor modification ?- Telemetry monitoring ?- PT consult, OT consult, Speech consult ?- Stroke team to  follow  ?- Neurology will continue to follow  ? ?Beulah Gandy DNP, ACNPC-AG  ? ?I have seen the patient and reviewed the above note.  There was a question of cerebritis raised on imaging. ? ?The imaging fits with the distributio

## 2021-09-25 NOTE — Assessment & Plan Note (Addendum)
Increase to Crestor 20 mg daily. ?

## 2021-09-25 NOTE — Assessment & Plan Note (Addendum)
Baseline creatinine of about 1.4-1.5. Creatinine stable relative to baseline ?

## 2021-09-25 NOTE — Procedures (Addendum)
Patient Name: [name] [place]  ?MRN: [id number]  ?Epilepsy Attending: Lora Havens  ?Referring Physician/Provider: Greta Doom, MD ?Duration: 09/24/2021 2153 to 09/25/2021 1018 ?  ?Patient history: 76yo F with ams. EEG to evaluate for seizure ?  ?Level of alertness: Awake, asleep ?  ?AEDs during EEG study: LEV  ?  ?Technical aspects: This EEG study was done with scalp electrodes positioned according to the 10-20 International system of electrode placement. Electrical activity was acquired at a sampling rate of '500Hz'$  and reviewed with a high frequency filter of '70Hz'$  and a low frequency filter of '1Hz'$ . EEG data were recorded continuously and digitally stored.  ?  ?Description: No clear posterior dominant rhythm was not seen. Sleep was characterized by vertex waves, sleep spindles (12-'14hz'$ ), maximal frontocentral region.  EEG showed continuous generalized and lateralized left hemisphere 3 to 6 Hz theta-delta slowing. Hyperventilation and photic stimulation were not performed.    ?  ?ABNORMALITY ?- Continuous slow, generalized and lateralized left hemisphere ?  ?IMPRESSION: ?This study is suggestive of cortical dysfunction arising from left hemisphere likely secondary to underlying structural abnormality. Additionally there is moderate diffuse encephalopathy, nonspecific etiology. No seizures or epileptiform discharges were seen throughout the recording. ?  ?Lora Havens  ?  ?

## 2021-09-25 NOTE — Progress Notes (Signed)
Speech Language Pathology Treatment: Dysphagia  ?Patient Details ?Name: [name] ?MRN: [id number] ?DOB: [birth year] ?Today's Date: 09/25/2021 ?Time: 6384-5364 ?SLP Time Calculation (min) (ACUTE ONLY): 10 min ? ?Assessment / Plan / Recommendation ?Clinical Impression ? Patient seen with her brother in room as well as her daughter. She was alert and receptive towards drinking some juice. She did not have the excessively strong grasp as she was exhibiting previous date and she was able to hold a styrofoam cup after SLP assisted with setup in her hand. She did require tactile and visual cues to bring straw to mouth. She exhibited a suspected mildly delayed swallow initiation but no overt s/s of aspiration or penetration and no anterior spillage or oral residuals during or after swallows. Daughter reports she ate some breakfast this morning and seemed to do well. SLP is recommending to continue with Dys 3 solids, thin liquids diet and will follow for toleration. ?  ?HPI HPI: PT is a 76 y.o. female with medical history significant of anemia, osteoarthritis, breast cancer, multiple myeloma, history of radiation therapy, , type II DM, E. 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  CT head and MRI brain negative for acute changes.  Daughter admits pt had language deficits after her TBI.  Pt did not pass 3 ounce Yale swallow screen, thus SLP swallow eval ordered.    [name], daughter, reports prior to admit, pt was holding food in her mouth and her appetite has been poor. Patient evaluated for swallow by SLP on 09/23/21 and Dys 3 (mechanical soft) solids and thin liquids recommended with no f/u recommended. On 09/24/21, rapid response called and stat MRI ordered secondary to appearance of new onset stroke symptoms. Repeat brain MRI on 09/24/21  showed findings with concern for ischemic infarction or cerebritis like herpes encephalitis. (Brain MRI on 09/22/21 showed no acute infarction but brain MRI on 5/7 showed "Swelling, increased T2 signal and restricted diffusion in the left occipital lobe and posterior temporal lobe, lateral more than medial. This does not appear to represent a typical posterior cerebral artery distribution. Although this could represent ischemic  infarction, consider cerebritis including herpes encephalitis". ?  ?   ?SLP Plan ? Continue with current plan of care ? ?Patient needs continued Speech Lanaguage Pathology Services ?  ?Recommendations for follow up therapy are one component of a multi-disciplinary discharge planning process, led by the attending physician.  Recommendations may be updated based on patient status, additional functional criteria and insurance authorization. ?  ? ?Recommendations  ?Diet recommendations: Dysphagia 3 (mechanical soft);Thin liquid ?Liquids provided via: Cup;Straw ?Medication Administration: Crushed with puree ?Supervision: Patient able to self feed;Full supervision/cueing for compensatory strategies;Staff to assist with self feeding ?Compensations: Minimize environmental distractions;Small sips/bites;Slow rate ?Postural Changes and/or Swallow Maneuvers: Seated upright 90 degrees  ?   ?    ?   ? ? ? ? Oral Care Recommendations: Staff/trained caregiver to provide oral care;Oral care BID ?Follow Up Recommendations: Other (comment) ?Assistance recommended at discharge: Frequent or constant Supervision/Assistance ?SLP Visit Diagnosis: Dysphagia, oral phase (R13.11) ?Plan: Continue with current plan of care ? ? ? ? ?  ?  ? ?Sonia Baller, MA, CCC-SLP ?Speech Therapy ? ?

## 2021-09-25 NOTE — Assessment & Plan Note (Addendum)
Outpatient urology follow-up referral already placed. ?

## 2021-09-25 NOTE — Plan of Care (Signed)
Pt is alert, pt speech is garbled, will say a few words clearly. Pt's daughter present at bedside, has been helping in caring for patient. EEG monitor in place, no seizure like activity noted.   ? ? ? ?Problem: Education: ?Goal: Knowledge of General Education information will improve ?Description: Including pain rating scale, medication(s)/side effects and non-pharmacologic comfort measures ?Outcome: Progressing ?  ?Problem: Health Behavior/Discharge Planning: ?Goal: Ability to manage health-related needs will improve ?Outcome: Progressing ?  ?Problem: Clinical Measurements: ?Goal: Ability to maintain clinical measurements within normal limits will improve ?Outcome: Progressing ?Goal: Will remain free from infection ?Outcome: Progressing ?Goal: Diagnostic test results will improve ?Outcome: Progressing ?Goal: Respiratory complications will improve ?Outcome: Progressing ?Goal: Cardiovascular complication will be avoided ?Outcome: Progressing ?  ?Problem: Activity: ?Goal: Risk for activity intolerance will decrease ?Outcome: Progressing ?  ?Problem: Nutrition: ?Goal: Adequate nutrition will be maintained ?Outcome: Progressing ?  ?Problem: Coping: ?Goal: Level of anxiety will decrease ?Outcome: Progressing ?  ?Problem: Elimination: ?Goal: Will not experience complications related to bowel motility ?Outcome: Progressing ?Goal: Will not experience complications related to urinary retention ?Outcome: Progressing ?  ?Problem: Pain Managment: ?Goal: General experience of comfort will improve ?Outcome: Progressing ?  ?Problem: Safety: ?Goal: Ability to remain free from injury will improve ?Outcome: Progressing ?  ?Problem: Skin Integrity: ?Goal: Risk for impaired skin integrity will decrease ?Outcome: Progressing ?  ?

## 2021-09-25 NOTE — Assessment & Plan Note (Addendum)
Continue donepezil, Megace, memantine ?

## 2021-09-26 ENCOUNTER — Inpatient Hospital Stay (HOSPITAL_COMMUNITY): Payer: Medicare Other

## 2021-09-26 DIAGNOSIS — I639 Cerebral infarction, unspecified: Secondary | ICD-10-CM | POA: Diagnosis not present

## 2021-09-26 DIAGNOSIS — N390 Urinary tract infection, site not specified: Secondary | ICD-10-CM | POA: Diagnosis not present

## 2021-09-26 DIAGNOSIS — G3184 Mild cognitive impairment, so stated: Secondary | ICD-10-CM | POA: Diagnosis not present

## 2021-09-26 DIAGNOSIS — E876 Hypokalemia: Secondary | ICD-10-CM

## 2021-09-26 DIAGNOSIS — S098XXA Other specified injuries of head, initial encounter: Secondary | ICD-10-CM | POA: Diagnosis not present

## 2021-09-26 DIAGNOSIS — G9341 Metabolic encephalopathy: Secondary | ICD-10-CM | POA: Diagnosis not present

## 2021-09-26 LAB — CBC
HCT: 33.3 % — ABNORMAL LOW (ref 36.0–46.0)
Hemoglobin: 11.3 g/dL — ABNORMAL LOW (ref 12.0–15.0)
MCH: 31.3 pg (ref 26.0–34.0)
MCHC: 33.9 g/dL (ref 30.0–36.0)
MCV: 92.2 fL (ref 80.0–100.0)
Platelets: 242 10*3/uL (ref 150–400)
RBC: 3.61 MIL/uL — ABNORMAL LOW (ref 3.87–5.11)
RDW: 12.8 % (ref 11.5–15.5)
WBC: 5.9 10*3/uL (ref 4.0–10.5)
nRBC: 0 % (ref 0.0–0.2)

## 2021-09-26 LAB — BASIC METABOLIC PANEL
Anion gap: 7 (ref 5–15)
BUN: 8 mg/dL (ref 8–23)
CO2: 14 mmol/L — ABNORMAL LOW (ref 22–32)
Calcium: 7.2 mg/dL — ABNORMAL LOW (ref 8.9–10.3)
Chloride: 119 mmol/L — ABNORMAL HIGH (ref 98–111)
Creatinine, Ser: 1.18 mg/dL — ABNORMAL HIGH (ref 0.44–1.00)
GFR, Estimated: 49 mL/min — ABNORMAL LOW (ref >60)
Glucose, Bld: 84 mg/dL (ref 70–99)
Potassium: 2.9 mmol/L — ABNORMAL LOW (ref 3.5–5.1)
Sodium: 140 mmol/L (ref 135–145)

## 2021-09-26 LAB — HSV 1/2 PCR, CSF
HSV-1 DNA: NEGATIVE
HSV-2 DNA: NEGATIVE

## 2021-09-26 LAB — PATHOLOGIST SMEAR REVIEW: Path Review: NEGATIVE

## 2021-09-26 LAB — LIPID PANEL
Cholesterol: 112 mg/dL (ref 0–200)
HDL: 21 mg/dL — ABNORMAL LOW (ref >40)
LDL Cholesterol: 71 mg/dL (ref 0–99)
Total CHOL/HDL Ratio: 5.3 RATIO
Triglycerides: 98 mg/dL (ref <150)
VLDL: 20 mg/dL (ref 0–40)

## 2021-09-26 MED ORDER — IOHEXOL 300 MG/ML  SOLN
75.0000 mL | Freq: Once | INTRAMUSCULAR | Status: AC | PRN
  Administered 2021-09-26: 75 mL via INTRAVENOUS

## 2021-09-26 MED ORDER — CALCIUM GLUCONATE-NACL 1-0.675 GM/50ML-% IV SOLN
1.0000 g | Freq: Once | INTRAVENOUS | Status: AC
  Administered 2021-09-26: 1000 mg via INTRAVENOUS
  Filled 2021-09-26 (×2): qty 50

## 2021-09-26 MED ORDER — CEPHALEXIN 500 MG PO CAPS
500.0000 mg | ORAL_CAPSULE | Freq: Three times a day (TID) | ORAL | Status: AC
  Administered 2021-09-26 (×3): 500 mg via ORAL
  Filled 2021-09-26 (×3): qty 1

## 2021-09-26 MED ORDER — POTASSIUM CHLORIDE 20 MEQ PO PACK
60.0000 meq | PACK | Freq: Once | ORAL | Status: AC
  Administered 2021-09-26: 60 meq via ORAL
  Filled 2021-09-26: qty 3

## 2021-09-26 MED ORDER — ROSUVASTATIN CALCIUM 20 MG PO TABS
20.0000 mg | ORAL_TABLET | Freq: Every day | ORAL | Status: DC
Start: 2021-09-27 — End: 2021-09-29
  Administered 2021-09-27 – 2021-09-29 (×3): 20 mg via ORAL
  Filled 2021-09-26 (×3): qty 1

## 2021-09-26 NOTE — Assessment & Plan Note (Addendum)
Repleted and resolved 

## 2021-09-26 NOTE — Care Management Important Message (Signed)
Important Message ? ?Patient Details  ?Name: [name] [place] ?MRN: [id number] ?Date of Birth: December 10, 1945 ? ? ?Medicare Important Message Given:  Yes ? ? ? ? ?Wesam Gearhart ?09/26/2021, 3:01 PM ?

## 2021-09-26 NOTE — Progress Notes (Signed)
Inpatient Rehab Admissions Coordinator Note:  ? ?Per therapy patient was screened for CIR candidacy by Louise Rawson Danford Bad, CCC-SLP. Note family is requesting inpatient rehab in [place]. TOC is working with family on this. AC will not place  a rehab consult. ? ? ?Gayland Curry, MS, CCC-SLP ?Admissions Coordinator ?[phone] ?09/26/21 ?1:21 PM ? ?

## 2021-09-26 NOTE — Progress Notes (Signed)
?Progress Note ? ? ?[name][id number] DOB: Dec 14, 1945 DOA: 09/22/2021     3 ?DOS: the patient was seen and examined on 09/26/2021 at 10:45AM ?  ? ? ? ?Brief hospital course: ?[name] is a 76 y.o. F with brain injury with resulting cognitive impairment/dementia, diabetes, breast cancer, multiple myeloma, renal mass who presented with confusion. ? ? ?5/4: Admitted with aphasia, confusion, MRI brain normal ?5/5: EEG without definite seizure, remains confused ?5/6: Still no improvement, MRI brain repeated --> temporal lobe enhancement, Acyclovir and antibiotics started, transferred to [facility] for LP and cEEG ?5/7: LP traumatic tap ?5/8: Re-review of MRI suggests stroke, antibiotics stopped, HSV negative, acyclovir stopped, CT ordered ? ? ? ? ?Assessment and Plan: ?* Acute metabolic encephalopathy ?At baseline uses cane, independent with feeding/toileting, interactive and recognizes family members and engages in conversation. ? ?On presentation was confused, disoriented. ? ?MRI brain initially read as normal and there was a strong clinical suspicion for metabolic encephalopathy due to UTI.  Started on antibiotics, fluids. ? ?Over 48 hours, no improvement.  Continued to have decreased mentation, aphasia and left-ward gaze preference.  Repeat MRI brain showed temporal and occipital lobe enhancement consistent with ischemia or infection (as above).  Started on antibiotics, acyclovir and transferred to our hospital for comprehensive neurological evaluation ? ?LTM EEG normal.  LP obtained and of poor quality, but HSV ruled out.  Patient improving today, and clinical suspicion for bacterial meningitis low. ?- Consult Neurology ?- CT head ordered for today ?- Stop antibiotics ?- Stop acyclovir  ? ?Hypokalemia ?- Supplemental magnesium ?- Check magnesium ? ? ?Hypocalcemia ?- Supplement Ca ? ?Acute ischemic stroke ([place]) ?Initial MRI was read as no stroke and has severe motion artifact.  Repeat MRI on 5/6 showed  T2 enhancement and restricted diffusion in LEFT occipital and lateral temporal lobe, again motion artifact (this is in the setting of CTA showing LEFT PCA occlusion or severe stenosis, penumbra in the left PCA distribution as well as severe right P2 PCA stenosis). ? ?On re-review by Neurology, there does appear to be some restricted diffusion in the initial MRI, and the second MRI has some appearance of stroke, but also with cerebritis due to paraneoplastic syndrome or infection in the differential. ? ?LP under fluoroscopy was traumatic, and low volume, but did not have a pleocytosis to suggest infection, so antibiotics have been stopped, and HSV PCR negative so acyclovir stopped. ? ?Today, patient somewhat better. ?-Non-invasive angiography showed above PCA stenoses but also bilateral M2 MCA stenoses and right A2 ACA stenosis, <50% carotid stenosis ?-Echocardiogram showed no cardiogenic source of embolism ?-Lipids ordered, continue atorvastatin ?-Aspirin ordered ?  ? ?B12 deficiency ?- Continue vitamin B12 ? ?Hyperlipidemia ?- Continue Crestor ? ?Hypertension ?Blood pressure normal ?- Continue amlodipine, atenolol ? ?Left kidney mass ?- Outpatient urology follow-up depending on goals of care ? ?Acute UTI (urinary tract infection) ?Urine culture growing E. Coli.  Completed 5 days cephalosporin.  Afebrile, HR and WBC normal. ? ?History of multiple strokes ?- Continue Crestor, aspirin ? ?Aphasia with TBI (traumatic brain injury), closed ?  ? ?Amnestic MCI (mild cognitive impairment with memory loss) ?- Continue donepezil, Megace, memantine ? ?Stage 3b chronic kidney disease (CKD) ([place]) ?Creatinine stable relative to baseline ? ?Depression ?  ? ?Myeloma ([place]) ?No active disease, oncology have evaluated the patient ? ? ? ? ? ? ? ? ? ?Subjective: Patient only able to speak gibberish.  Nursing report no pain, respiratory distress, seizures.  She  is much more alert today, following some commands. ? ? ? ? ?Physical  Exam: ?Vitals:  ? 09/26/21 0347 09/26/21 0844 09/26/21 1141 09/26/21 1539  ?BP: 130/80 132/75 130/68 (!) 143/68  ?Pulse: 70 70 66 80  ?Resp: _0 ?Temp: 99.2 ?F (37.3 ?C) 98.4 ?F (36.9 ?C) 98.7 ?F (37.1 ?C) 97.9 ?F (36.6 ?C)  ?TempSrc: Oral Oral Oral Oral  ?SpO2: 100% 100% 98% 100%  ?Weight:      ? ?Elderly adult female, sitting up in recliner, makes eye contact briefly, mumbling unintelligibly ?RRR, no murmurs, no peripheral edema, no JVD ?Respiratory effort normal, lungs clear without rales or wheezes ?I sent some mild weakness in the right upper extremity and the left lower extremity, her speech is unintelligible, but she does follow some commands. ? ?Data Reviewed: ?Discussed with neurology, nursing notes reviewed, vital signs reviewed ?Carotid ultrasound, less than 50% occlusion ?LP with 18,000 RBCs, 17 WBCs ?HSV PCR negative ?Complete blood count with mild anemia ?Echocardiogram with no cardiogenic source of embolism, normal EF ?LDL 71 ?A1c 5.4 ?Bicarb slightly low ?Potassium 2.9 ?Hypocalcemia noted ?Creatinine 1.2, slightly down from previous ? ? ? ?Family Communication: Daughter at the bedside ? ? ? ?Disposition: ?Status is: Inpatient ? ? ? ? ? ? ? ? ?Author: ?Edwin Dada, MD ?09/26/2021 5:02 PM ? ?For on call review [email or web address].  ? ? ?

## 2021-09-26 NOTE — Evaluation (Addendum)
Occupational Therapy Evaluation ?Patient Details ?Name: [name] ?MRN: [id number] ?DOB: 05/10/1946 ?Today's Date: 09/26/2021 ? ? ?History of Present Illness [name] is a 76 y.o. female who presents from ED s/p waking up with confusion, generalized weakness, unsteady gait with difficulty walking, aphasia with slurred speech. PMH: anemia, osteoarthritis, breast cancer, multiple myeloma, history of radiation therapy, , type II DM, E. 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 ?Clinical Impression ?  ?Per daughter report, pt independent with ADLs at baseline, uses cane/RW for mobility. Pt has been living with daughter for 2 weeks, and does pt's IADLs. Pt currently following commands inconsistently, R inattention, and unintelligible speech however is able to state "bath" prior to incontinent episode. Pt currently mod-max A for ADLs, mod A for sit to stand transfer x2 with RW. Max verbal/visual/tactile cuing throughout session. Pt presenting with impairments listed below, will follow acutely. Recommend AIR at d/c.   ?   ? ?Recommendations for follow up therapy are one component of a multi-disciplinary discharge planning process, led by the attending physician.  Recommendations may be updated based on patient status, additional functional criteria and insurance authorization.  ? ?Follow Up Recommendations ? Acute inpatient rehab (3hours/day)  ?  ?Assistance Recommended at Discharge Frequent or constant Supervision/Assistance  ?Patient can return home with the following Two people to help with walking and/or transfers;A lot of help with bathing/dressing/bathroom;Assistance with cooking/housework;Direct supervision/assist for medications management;Direct supervision/assist for financial management;Help with stairs or ramp for entrance;Assist for transportation;Assistance with feeding ? ?   ?Functional Status Assessment ? Patient has had a recent decline in their functional status and demonstrates the ability to make significant improvements in function in a reasonable and predictable amount of time.  ?Equipment Recommendations ? None recommended by OT;Other (comment) (defer to next venue of care)  ?  ?Recommendations for Other Services Rehab consult ? ? ?  ?Precautions / Restrictions Precautions ?Precautions: Fall ?Precaution Comments: R inattention, incontinence, garbled speech ?Restrictions ?Weight Bearing Restrictions: No  ? ?  ? ?Mobility Bed Mobility ?  ?  ?  ?  ?  ?  ?  ?General bed mobility comments: up in chair upon arrival/returned to chair ?  ? ?Transfers ?Overall transfer level: Needs assistance ?Equipment used: Rolling walker (2 wheels) ?Transfers: Sit to/from Stand ?Sit to Stand: Mod assist ?  ?  ?  ?  ?  ?General transfer comment: max visual/verbal/tactile cues, completed x2 ?  ? ?  ?Balance Overall balance assessment: Needs assistance ?Sitting-balance support: Feet supported, No upper extremity supported ?Sitting balance-Leahy Scale: Good ?  ?  ?Standing balance support: Single extremity supported, During functional activity ?Standing balance-Leahy Scale: Poor ?Standing balance comment: pt dependent on external assist ?  ?  ?  ?  ?  ?  ?  ?  ?  ?  ?  ?   ? ?ADL either performed or assessed with clinical judgement  ? ?ADL Overall ADL's : Needs assistance/impaired ?Eating/Feeding: Moderate assistance;Sitting ?  ?Grooming: Maximal assistance;Sitting;Wash/dry hands;Wash/dry face ?Grooming Details (indicate cue type and reason): hand over hand assistance to guide ?Upper Body Bathing: Maximal assistance;Sitting ?  ?Lower Body Bathing: Maximal assistance;Sitting/lateral leans ?  ?Upper Body Dressing : Moderate assistance;Sitting ?Upper Body Dressing Details (indicate cue type and reason): pt able to pull up sleeve over LUE ?Lower Body Dressing: Maximal assistance;Sitting/lateral leans ?Lower  Body Dressing Details (  indicate cue type and reason): to don socks ?Toilet Transfer: Moderate assistance;BSC/3in1;Rolling walker (2 wheels);Maximal assistance;Cueing for sequencing;Cueing for safety ?  ?Toileting- Clothing Manipulation and Hygiene: Maximal assistance;Sitting/lateral lean ?Toileting - Clothing Manipulation Details (indicate cue type and reason): for pericare ?  ?  ?Functional mobility during ADLs: Moderate assistance;Rolling walker (2 wheels) ?   ? ? ? ?Vision   ?Vision Assessment?: Vision impaired- to be further tested in functional context ?Additional Comments: pt with L gaze preference, will further assess  ?   ?Perception   ?  ?Praxis   ?  ? ?Pertinent Vitals/Pain Pain Assessment ?Pain Assessment: Faces ?Pain Score: 0-No pain  ? ? ? ?Hand Dominance Right ?  ?Extremity/Trunk Assessment Upper Extremity Assessment ?Upper Extremity Assessment: RUE deficits/detail ?RUE Deficits / Details: decreased ROM/strength, will further assess ?RUE Coordination: decreased fine motor;decreased gross motor ?  ?Lower Extremity Assessment ?Lower Extremity Assessment: Defer to PT evaluation ?  ?Cervical / Trunk Assessment ?Cervical / Trunk Assessment: Kyphotic ?  ?Communication Communication ?Communication: Expressive difficulties;Receptive difficulties ?  ?Cognition Arousal/Alertness: Awake/alert ?Behavior During Therapy: Flat affect ?Overall Cognitive Status: Impaired/Different from baseline ?Area of Impairment: Attention, Memory, Following commands, Awareness, Problem solving, Safety/judgement ?  ?  ?  ?  ?  ?  ?  ?  ?  ?Current Attention Level: Focused ?Memory: Decreased short-term memory ?Following Commands: Follows one step commands with increased time, Follows one step commands inconsistently ?Safety/Judgement: Decreased awareness of safety, Decreased awareness of deficits ?Awareness: Intellectual ?Problem Solving: Slow processing, Difficulty sequencing, Requires verbal cues, Requires tactile cues ?General  Comments: inconsistently follows commands, unintelligible speech, pt able to verbalize "bath" prior to incontinent episode ?  ?  ?General Comments  daughter in room, assisting throughout session as needed, provides pt PLOF /home set up info ? ?  ?Exercises   ?  ?Shoulder Instructions    ? ? ?Home Living Family/patient expects to be discharged to:: Inpatient rehab ?Living Arrangements: Children ?Available Help at Discharge: Family;Available PRN/intermittently (daughter works during day) ?Type of Home: Apartment ?  ?  ?  ?  ?  ?  ?  ?  ?  ?  ?  ?  ?  ?Additional Comments: Pt recently moved in with dtr 2 weeks go due to cognitive regression posing safety concerns when left alone ? Lives With: Daughter ? ?  ?Prior Functioning/Environment Prior Level of Function : Needs assist ?  ?  ?  ?  ?  ?  ?Mobility Comments: pt used cane primarily, sometimes RW, dtr started recently assisting pt in/out of shower ?ADLs Comments: Dtr is POA and manages medication, finances, grocery shopping, and driving, pt does not drive ?  ? ?  ?  ?OT Problem List:   ?  ?   ?OT Treatment/Interventions: Self-care/ADL training;Therapeutic exercise;Neuromuscular education;Therapeutic activities;Patient/family education;Balance training;Cognitive remediation/compensation;DME and/or AE instruction;Visual/perceptual remediation/compensation  ?  ?OT Goals(Current goals can be found in the care plan section) Acute Rehab OT Goals ?Patient Stated Goal: none stated ?OT Goal Formulation: With patient/family ?Time For Goal Achievement: 10/10/21 ?Potential to Achieve Goals: Fair ?ADL Goals ?Pt Will Perform Grooming: with min assist;sitting;standing ?Pt Will Transfer to Toilet: with min assist;stand pivot transfer;bedside commode ?Additional ADL Goal #1: Pt will follow single step command accurately 75% of the time in prep for ADLs  ?OT Frequency: Min 3X/week ?  ? ?Co-evaluation   ?  ?  ?  ?  ? ?  ?AM-PAC OT "6 Clicks" Daily Activity     ?Outcome Measure Help  from another person eating meals?: A Lot ?Help from another person taking care of personal grooming?: A Lot ?Help from another person toileting, which includes using toliet, bedpan, or urinal?: Total ?Help from

## 2021-09-26 NOTE — Progress Notes (Addendum)
STROKE TEAM PROGRESS NOTE   INTERVAL HISTORY Patient is seen in her room with her daughter at the bedside.  On Thursday, she was found to have confusion, aphasia and difficulty walking, and on Saturday, she had left gaze deviation and right sided neglect.  MRI on 5/6 revealed swelling and restricted diffusion in the left occipital and posterior temporal lobes.  LP was performed 5/7 due to concern for cerebritis, but cell count simply demonstrated a traumatic tap. HSV PCR pending. She has been evaluated for seizures with LTM. Patient started states that patient is doing better today and was more interactive.  She remains aphasic with right hemianopsia.  No weakness seizure activity. Vitals:   09/26/21 0005 09/26/21 0347 09/26/21 0844 09/26/21 1141  BP: 128/74 130/80 132/75 130/68  Pulse: 88 70 70 66  Resp: 18 18 16 18   Temp: 99 F (37.2 C) 99.2 F (37.3 C) 98.4 F (36.9 C) 98.7 F (37.1 C)  TempSrc: Oral Oral Oral Oral  SpO2: 100% 100% 100% 98%  Weight:       CBC:  Recent Labs  Lab 09/22/21 0900 09/22/21 0910 09/24/21 0500 09/26/21 0306  WBC 7.0   < > 7.6 5.9  NEUTROABS 4.9  --   --   --   HGB 12.9   < > 13.6 11.3*  HCT 40.8   < > 41.2 33.3*  MCV 98.6   < > 97.2 92.2  PLT 280   < > 280 242   < > = values in this interval not displayed.   Basic Metabolic Panel:  Recent Labs  Lab 09/24/21 0500 09/26/21 0306  NA 140 140  K 4.2 2.9*  CL 110 119*  CO2 19* 14*  GLUCOSE 94 84  BUN 13 8  CREATININE 1.33* 1.18*  CALCIUM 9.4 7.2*   Lipid Panel:  Recent Labs  Lab 09/26/21 0306  CHOL 112  TRIG 98  HDL 21*  CHOLHDL 5.3  VLDL 20  LDLCALC 71   HgbA1c:  Recent Labs  Lab 09/25/21 1648  HGBA1C 5.4   Urine Drug Screen:  Recent Labs  Lab 09/22/21 0837  LABOPIA NONE DETECTED  COCAINSCRNUR NONE DETECTED  LABBENZ NONE DETECTED  AMPHETMU NONE DETECTED  THCU NONE DETECTED  LABBARB NONE DETECTED    Alcohol Level  Recent Labs  Lab 09/22/21 0900  ETH <10     IMAGING past 24 hours ECHOCARDIOGRAM COMPLETE  Result Date: 09/25/2021    ECHOCARDIOGRAM REPORT   Patient Name:   SHAYNIA ALEN Date of Exam: 09/25/2021 Medical Rec #:  [id number]        Height:       63.0 in Accession #:    [id number]       Weight:       156.5 lb Date of Birth:  1945/10/31         BSA:          1.742 m Patient Age:    75 years         BP:           145/76 mmHg Patient Gender: F                HR:           78 bpm. Exam Location:  Inpatient Procedure: 2D Echo Indications:    stroke  History:        Patient has prior history of Echocardiogram examinations, most  recent 10/07/2019. History of strokes. chronic kidney disease.;                 Risk Factors:Hypertension and Dyslipidemia.  Sonographer:    Delcie Roch RDCS Referring Phys: [phone] DENISE A WOLFE  Sonographer Comments: Image acquisition challenging due to uncooperative patient and Image acquisition challenging due to respiratory motion. IMPRESSIONS  1. Left ventricular ejection fraction, by estimation, is 65 to 70%. The left ventricle has normal function. The left ventricle has no regional wall motion abnormalities. There is mild asymmetric left ventricular hypertrophy of the infero-lateral segment. Left ventricular diastolic parameters are consistent with Grade I diastolic dysfunction (impaired relaxation).  2. Right ventricular systolic function is normal. The right ventricular size is normal. Tricuspid regurgitation signal is inadequate for assessing PA pressure.  3. The mitral valve is grossly normal. No evidence of mitral valve regurgitation. No evidence of mitral stenosis.  4. The aortic valve is grossly normal. There is mild calcification of the aortic valve. Aortic valve regurgitation is not visualized. No aortic stenosis is present.  5. The inferior vena cava is normal in size with greater than 50% respiratory variability, suggesting right atrial pressure of 3 mmHg. Conclusion(s)/Recommendation(s): No  intracardiac source of embolism detected on this transthoracic study. Consider a transesophageal echocardiogram to exclude cardiac source of embolism if clinically indicated. FINDINGS  Left Ventricle: Left ventricular ejection fraction, by estimation, is 65 to 70%. The left ventricle has normal function. The left ventricle has no regional wall motion abnormalities. The left ventricular internal cavity size was normal in size. There is  mild asymmetric left ventricular hypertrophy of the infero-lateral segment. Left ventricular diastolic parameters are consistent with Grade I diastolic dysfunction (impaired relaxation). Right Ventricle: The right ventricular size is normal. No increase in right ventricular wall thickness. Right ventricular systolic function is normal. Tricuspid regurgitation signal is inadequate for assessing PA pressure. Left Atrium: Left atrial size was normal in size. Right Atrium: Right atrial size was normal in size. Pericardium: There is no evidence of pericardial effusion. Presence of epicardial fat layer. Mitral Valve: The mitral valve is grossly normal. Mild to moderate mitral annular calcification. No evidence of mitral valve regurgitation. No evidence of mitral valve stenosis. Tricuspid Valve: The tricuspid valve is normal in structure. Tricuspid valve regurgitation is not demonstrated. No evidence of tricuspid stenosis. Aortic Valve: The aortic valve is grossly normal. There is mild calcification of the aortic valve. Aortic valve regurgitation is not visualized. No aortic stenosis is present. Pulmonic Valve: The pulmonic valve was normal in structure. Pulmonic valve regurgitation is not visualized. No evidence of pulmonic stenosis. Aorta: The aortic root is normal in size and structure. Venous: The inferior vena cava is normal in size with greater than 50% respiratory variability, suggesting right atrial pressure of 3 mmHg. IAS/Shunts: No atrial level shunt detected by color flow Doppler.   LEFT VENTRICLE PLAX 2D LVIDd:         4.30 cm   Diastology LVIDs:         2.90 cm   LV e' medial:    5.44 cm/s LV PW:         1.10 cm   LV E/e' medial:  14.3 LV IVS:        0.90 cm   LV e' lateral:   7.51 cm/s LVOT diam:     1.80 cm   LV E/e' lateral: 10.4 LV SV:         52 LV SV Index:   30  LVOT Area:     2.54 cm  RIGHT VENTRICLE             IVC RV S prime:     12.30 cm/s  IVC diam: 1.90 cm TAPSE (M-mode): 1.8 cm LEFT ATRIUM             Index        RIGHT ATRIUM          Index LA diam:        3.10 cm 1.78 cm/m   RA Area:     8.67 cm LA Vol (A2C):   27.8 ml 15.96 ml/m  RA Volume:   14.40 ml 8.26 ml/m LA Vol (A4C):   27.1 ml 15.55 ml/m LA Biplane Vol: 28.4 ml 16.30 ml/m  AORTIC VALVE LVOT Vmax:   105.00 cm/s LVOT Vmean:  69.700 cm/s LVOT VTI:    0.205 m  AORTA Ao Root diam: 2.60 cm Ao Asc diam:  2.70 cm MITRAL VALVE MV Area (PHT): 2.91 cm     SHUNTS MV Decel Time: 261 msec     Systemic VTI:  0.20 m MV E velocity: 77.80 cm/s   Systemic Diam: 1.80 cm MV A velocity: 117.00 cm/s MV E/A ratio:  0.66 Weston Brass MD Electronically signed by Weston Brass MD Signature Date/Time: 09/25/2021/4:16:24 PM    Final    DG FL GUIDED LUMBAR PUNCTURE  Result Date: 09/25/2021 CLINICAL DATA:  Encephalopathy EXAM: DIAGNOSTIC LUMBAR PUNCTURE UNDER FLUOROSCOPIC GUIDANCE COMPARISON:  None Available. FLUOROSCOPY: Radiation Exposure Index (as provided by the fluoroscopic device): 67.3 mGy PROCEDURE: Informed consent was obtained from the daughter prior to the procedure, including potential complications of headache, allergy, and pain. With the patient prone, the lower back was prepped with Betadine. 1% Lidocaine was used for local anesthesia. Lumbar puncture was attempted at multiple levels and fluid was accessed from the L2-L3 level using a 20 gauge needle with return of 0.5cc clear pink tinged CSF. Fluid resistant to flowing beyond level of needle hub. Opening pressure deferred. The patient tolerated the procedure well and  there were no apparent complications. IMPRESSION: Technically successful lumbar puncture with minimal fluid acquisition as described above Electronically Signed   By: Gerome Sam III M.D.   On: 09/25/2021 19:45    PHYSICAL EXAM General:  Alert, well-developed, well-nourished patient in no acute distress Respiratory:  Regular, unlabored respirations on room air  NEURO:  Mental Status: AA&Ox1, able to speak some words and phrases but expressive and receptive aphasia evident.  Speech is gibberish at times.  Will intermittently follow commands.  Blinks to threat on left but not on right.  Will move all extremities spontaneously against gravity equally..  ASSESSMENT/PLAN Ms. Neidy [name] is a 76 y.o. female with history of anemia, osteoarthritis, breast CA, multiple myeloma, T2DM, GERD, HSV esophagitis, stroke, TBI and multiple UTIs presenting with confusion, aphasia and difficulty walking which occurred on Thursday.  On Saturday, she had left gaze deviation and right sided neglect.  MRI on 5/6 revealed swelling and restricted diffusion in the left occipital and posterior temporal lobes, likely MCA infarct in setting of severe PCA stenosis.  LP was performed 5/7 due to concern for cerebritis, but cell count simply demonstrated a traumatic tap. HSV PCR pending. She has been evaluated for seizures with LTM.  Stroke:  left MCA infarct in occipital and temporal lobes likely secondary to intracranial atherosclerosis.  Code Stroke CT head No acute abnormality. Small vessel disease. ASPECTS 10.    CTA  head & neck occlusion or severe stenosis of left P2 PCA, severe right P2 PCA stenosis, moderate bilateral M2 MCA stenosis, right A2 ACA stenosis CT perfusion 22 mL penumbra in left PCA territory MRI  5/4 motion degraded, no acute infarction MRI 5/6 motion degraded. Swelling and restricted diffusion in left occipital and posterior temporal lobe CT head with contrast pending 2D Echo EF 65-70%, mild LVH,  grade 1 diastolic dysfunction, no atrial level shunt LDL 71 HgbA1c 5.4 VTE prophylaxis - SCDs    Diet   DIET DYS 3 Room service appropriate? Yes; Fluid consistency: Thin   aspirin 81 mg daily prior to admission, now on aspirin 81 mg daily.  And Plavix 75 mg daily for 3 months and then Plavix alone Therapy recommendations:  CIR  Disposition:  pending  Hypertension Home meds:  amlodipine 10 mg daily, atenolol 100 mg daily Stable Permissive hypertension (OK if < 220/120) but gradually normalize in 5-7 days Long-term BP goal normotensive  Hyperlipidemia Home meds:  rosuvastatin 10 mg daily, increased to 20 mg daily LDL 71, goal < 70 Continue statin at discharge  Concern for cerebritis Swelling seen on MRI suspicious for cerebritis LP performed 5/7, cell count shows traumatic tap HSV PCR pending  Other Stroke Risk Factors Advanced Age >/= 3   Other Active Problems Baseline dementia Continue home memantine and donepezil  Hospital day # 3  Cortney E Ernestina Columbia , MSN, AGACNP-BC [facility] See Amion for schedule and pager information 09/26/2021 12:31 PM  I have personally obtained history,examined this patient, reviewed notes, independently viewed imaging studies, participated in medical decision making and plan of care.ROS completed by me personally and pertinent positives fully documented  I have made any additions or clarifications directly to the above note. Agree with note above.  Patient presented with confusion and altered mental status initially thought to be encephalitis with limited MRI x2 suggest likely left posterior MCA region infarct with study suboptimal and early motion degraded.  Plan repeat CT scan of the head with and without contrast.  Aspirin and Plavix for 3 months followed by Plavix alone symptomatic intracranial stenosis.  Continue ongoing therapies.  Long discussion at bedside with patient daughter and answered questions.  Discussed with Dr.  Maryfrances Bunnell.  Greater than 50% time during this 35-minute visit is spent in counseling coordination of care about her stroke and aphasia and answering questions.  Delia Heady, MD Medical Director [facility] Pager: [phone] 09/26/2021 2:12 PM   To contact Stroke Continuity provider, please refer to [email or web address]. After hours, contact General Neurology

## 2021-09-26 NOTE — Evaluation (Signed)
Physical Therapy Evaluation ?Patient Details ?Name: [name] ?MRN: [id number] ?DOB: October 27, 1945 ?Today's Date: 09/26/2021 ? ?History of Present Illness ? [name] is a 76 y.o. female who presents from ED s/p waking up with confusion, generalized weakness, unsteady gait with difficulty walking, aphasia with slurred speech. PMH: anemia, osteoarthritis, breast cancer, multiple myeloma, history of radiation therapy, , type II DM, E. 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  ?Clinical Impression ? Pt admitted with above. Pt with noted R neglect however does use R UE and LE functionally despite not turning head to the R and with noted L gaze preference. Pt also with unintelligible speech and inability to follow commands.  Pt with STM and cognitive deficits PTA from previous TBI however per daughter was able to carry on conversation and was able to care for herself with exception of IADLs. Pt was ambulating primarily with SPC now requiring modAx1 and max tactile cues to transfer to chair. Pt with episode of stool incontinence limiting ability to ambulate today. Dtr to talk to family today about developing long term plan to provide 24/7 assist for patient. Pt to strongly benefit from AIR upon d/c to address above deficits and achieve safe level of function to return home with family. Acute PT to cont to follow. ?   ? ?Recommendations for follow up therapy are one component of a multi-disciplinary discharge planning process, led by the attending physician.  Recommendations may be updated based on patient status, additional functional criteria and insurance authorization. ? ?Follow Up Recommendations Acute inpatient rehab (3hours/day) ? ?  ?Assistance Recommended at Discharge Frequent or constant Supervision/Assistance  ?Patient can return home with the following ? A lot of help with walking  and/or transfers;A lot of help with bathing/dressing/bathroom;Direct supervision/assist for medications management;Direct supervision/assist for financial management;Assist for transportation;Help with stairs or ramp for entrance;Assistance with cooking/housework ? ?  ?Equipment Recommendations Rolling walker (2 wheels)  ?Recommendations for Other Services ? Rehab consult  ?  ?Functional Status Assessment Patient has had a recent decline in their functional status and demonstrates the ability to make significant improvements in function in a reasonable and predictable amount of time.  ? ?  ?Precautions / Restrictions Precautions ?Precautions: Fall ?Precaution Comments: R inattention, incontinence, garbled speech ?Restrictions ?Weight Bearing Restrictions: No  ? ?  ? ?Mobility ? Bed Mobility ?Overal bed mobility: Needs Assistance ?Bed Mobility: Sidelying to Sit, Rolling ?Rolling: Mod assist ?Sidelying to sit: Mod assist ?  ?  ?  ?General bed mobility comments: max directional verbal and tactile cues provided to complete transfer, modA for trunk elevation ?  ? ?Transfers ?Overall transfer level: Needs assistance ?Equipment used:  (1 person face to face due to requiring tactile cues for anterior weight shift) ?Transfers: Sit to/from Stand, Bed to chair/wheelchair/BSC ?Sit to Stand: Mod assist ?  ?Step pivot transfers: Mod assist ?  ?  ?  ?General transfer comment: max directional verbal and tactile cues to complete. pt with good initiation into forward weight shift to prep for standing, assist required to weight shift instanding to promote stepping to chair ?  ? ?Ambulation/Gait ?  ?  ?  ?  ?  ?  ?  ?General Gait Details: pt with incontinence of stool limiting ambulatoin ? ?Stairs ?  ?  ?  ?  ?  ? ?Wheelchair Mobility ?  ? ?Modified Rankin (Stroke  Patients Only) ?Modified Rankin (Stroke Patients Only) ?Pre-Morbid Rankin Score: Moderate disability ?Modified Rankin: Moderately severe disability ? ?  ? ?Balance Overall  balance assessment: Needs assistance ?Sitting-balance support: Feet supported, No upper extremity supported ?Sitting balance-Leahy Scale: Good ?  ?  ?Standing balance support: Single extremity supported, During functional activity ?Standing balance-Leahy Scale: Poor ?Standing balance comment: pt dependent on external assist ?  ?  ?  ?  ?  ?  ?  ?  ?  ?  ?  ?   ? ? ? ?Pertinent Vitals/Pain Pain Assessment ?Pain Assessment: Faces ?Faces Pain Scale: No hurt  ? ? ?Home Living Family/patient expects to be discharged to:: Inpatient rehab ?Living Arrangements: Children ?Available Help at Discharge: Family;Available PRN/intermittently (dtr works during the day) ?  ?  ?  ?  ?  ?  ?  ?Additional Comments: Pt recently moved in with dtr 2 weeks go due to cognitive regression posing safety concerns when left alone  ?  ?Prior Function Prior Level of Function : Needs assist ?  ?  ?  ?  ?  ?  ?Mobility Comments: pt used cane primarily, sometimes RW, dtr started recently assisting pt in/out of shower ?ADLs Comments: Dtr is POA and manages medication, finances, grocery shopping, and driving, pt does not drive ?  ? ? ?Hand Dominance  ?   ? ?  ?Extremity/Trunk Assessment  ? Upper Extremity Assessment ?Upper Extremity Assessment: RUE deficits/detail ?RUE Deficits / Details: generalized weakness compared to L ?  ? ?Lower Extremity Assessment ?Lower Extremity Assessment: RLE deficits/detail ?RLE Deficits / Details: pt able to WB and step to chair without knee buckling however noted to be weaker than L, pt unable to complete R LE LAQ in full ROM ?  ? ?Cervical / Trunk Assessment ?Cervical / Trunk Assessment: Kyphotic  ?Communication  ? Communication: Expressive difficulties;Receptive difficulties  ?Cognition Arousal/Alertness: Awake/alert ?Behavior During Therapy: Flat affect ?Overall Cognitive Status: Impaired/Different from baseline ?Area of Impairment: Attention, Memory, Following commands, Awareness, Problem solving, Safety/judgement ?   ?  ?  ?  ?  ?  ?  ?  ?  ?Current Attention Level: Focused ?Memory: Decreased short-term memory ?Following Commands: Follows one step commands with increased time, Follows one step commands inconsistently ?Safety/Judgement: Decreased awareness of safety, Decreased awareness of deficits (R inattention) ?Awareness: Intellectual (pt incontinent of stool and urine) ?Problem Solving: Slow processing, Difficulty sequencing, Requires verbal cues, Requires tactile cues ?General Comments: pt with unintelgible speech making it difficulty to determine pt's comprehension. Pt stated "yes" to all questions despite being wrong. Ie. Is your name [name]? pt stated yes and again yes s/p asking if pt's first name was [name]. pt with perseveration. Pt also with difficulty following commands without tactile cues, suspect some receptive aphasia as well ?  ?  ? ?  ?General Comments General comments (skin integrity, edema, etc.): L UE edema, per dtr pt with infiltrated IV in L UE from infiltrated IV (per dtr). pt assisted to [facility] due to urinary and stool incontinence, pt dependent for hygiene ? ?  ?Exercises    ? ?Assessment/Plan  ?  ?PT Assessment Patient needs continued PT services  ?PT Problem List Decreased strength;Decreased activity tolerance;Decreased balance;Decreased mobility;Decreased coordination;Decreased knowledge of use of DME;Decreased safety awareness;Decreased cognition ? ?   ?  ?PT Treatment Interventions DME instruction;Gait training;Stair training;Therapeutic exercise;Therapeutic activities;Functional mobility training;Neuromuscular re-education;Balance training   ? ?PT Goals (Current goals can be found in the Care Plan section)  ?Acute Rehab PT  Goals ?Patient Stated Goal: didn't state ?PT Goal Formulation: With patient/family ?Time For Goal Achievement: 10/10/21 ?Potential to Achieve Goals: Good ? ?  ?Frequency Min 4X/week ?  ? ? ?Co-evaluation   ?  ?  ?  ?  ? ? ?  ?AM-PAC PT "6 Clicks" Mobility  ?Outcome Measure Help  needed turning from your back to your side while in a flat bed without using bedrails?: A Little ?Help needed moving from lying on your back to sitting on the side of a flat bed without using bedrails?: A L

## 2021-09-26 NOTE — TOC Initial Note (Signed)
Transition of Care (TOC) - Initial/Assessment Note  ? ? ?Patient Details  ?Name: [name] ?MRN: [id number] ?Date of Birth: [birth year] ? ?Transition of Care (TOC) CM/SW Contact:    ?Pollie Friar, RN ?Phone Number: ?09/26/2021, 11:00 AM ? ?Clinical Narrative:                 ?Patient is from home with her daughter. Daughter works during the daytime. Daughter is interested in inpatient rehab in the [facility] area. Cm has sent referral to [facility] IR with daughters permission.  ?Daughter interested in caregiving resources after a rehab stay. CM has provided her information to assist in finding a caregiver.  ?TOC following.  ? ?Expected Discharge Plan: [place] ?Barriers to Discharge: Continued Medical Work up ? ? ?Patient Goals and CMS Choice ?Patient states their goals for this hospitalization and ongoing recovery are:: to go back home ?CMS Medicare.gov Compare Post Acute Care list provided to:: Patient Represenative (must comment) ?Choice offered to / list presented to : Adult Children ? ?Expected Discharge Plan and Services ?Expected Discharge Plan: [place] ?  ?Discharge Planning Services: CM Consult ?Post Acute Care Choice: IP Rehab ?Living arrangements for the past 2 months: [place] ?                ?  ?  ?  ?  ?  ?  ?  ?  ?  ?  ? ?Prior Living Arrangements/Services ?Living arrangements for the past 2 months: [place] ?Lives with:: Adult Children ?Patient language and need for interpreter reviewed:: Yes ?Do you feel safe going back to the place where you live?: Yes      ?Need for Family Participation in Patient Care: Yes (Comment) ?  ?  ?Criminal Activity/Legal Involvement Pertinent to Current Situation/Hospitalization: No - Comment as needed ? ?Activities of Daily Living ?Home Assistive Devices/Equipment: [place] Rile (specify type) ?ADL Screening (condition at time of admission) ?Patient's cognitive ability adequate to safely complete daily activities?: Yes ?Is the  patient deaf or have difficulty hearing?: No ?Does the patient have difficulty seeing, even when wearing glasses/contacts?: No ?Does the patient have difficulty concentrating, remembering, or making decisions?: Yes ?Patient able to express need for assistance with ADLs?: No ?Does the patient have difficulty dressing or bathing?: No ?Independently performs ADLs?: Yes (appropriate for developmental age) ?Does the patient have difficulty walking or climbing stairs?: Yes ?Weakness of Legs: Both ?Weakness of Arms/Hands: None ? ?Permission Sought/Granted ?  ?  ?   ?   ?   ?   ? ?Emotional Assessment ?Appearance:: Appears stated age ?  ?  ?Orientation: : Oriented to Self ?Alcohol / Substance Use: Not Applicable ?Psych Involvement: No (comment) ? ?Admission diagnosis:  Confusion [R41.0] ?Acute cystitis without hematuria [N30.00] ?Acute metabolic encephalopathy [W23.76] ?Patient Active Problem List  ? Diagnosis Date Noted  ? Acute ischemic stroke ([place]) 09/26/2021  ? B12 deficiency 09/25/2021  ? Acute metabolic encephalopathy 28/31/5176  ? Acute UTI (urinary tract infection) 09/22/2021  ? Left kidney mass 09/22/2021  ? Hypertension 09/22/2021  ? Hyperlipidemia 09/22/2021  ? Occipital stroke ([place]) 12/19/2019  ? Aphasia with TBI (traumatic brain injury), closed 07/28/2019  ? Visual field contraction, left 07/28/2019  ? Aphasia as late effect of stroke 07/28/2019  ? History of multiple strokes 07/28/2019  ? Visual field constriction, left 06/23/2019  ? Intractable acute post-traumatic headache 06/23/2019  ? 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 TBI (traumatic  brain injury) ([place]) 06/23/2019  ? Shuffling gait 06/23/2019  ? 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 Scalp laceration 06/05/2019  ? Lytic bone lesions on xray 03/14/2016  ? Anemia of chronic disease 12/14/2015  ? Stage 3b chronic kidney disease (CKD) ([place])  12/01/2015  ? Anorexia 12/01/2015  ? Cough 12/01/2015  ? Breast cancer, stage 1 ([place]) 09/09/2015  ? Encounter for CDL (commercial driving license) exam 94/11/6806  ? Depression 04/11/2012  ? Personal history of adenomatous colonic polyps 11/02/2011  ? Degenerative joint disease involving multiple joints 03/29/2011  ? Myeloma ([place]) 03/28/2011  ? ?PCP:  [name], MD ?Pharmacy:   ?[id number] [place] [id number] ?[place]?Phone: [phone]: [phone]? ? ? ? ?Social Determinants of Health (SDOH) Interventions ?  ? ?Readmission Risk Interventions ?   ? View : No data to display.  ?  ?  ?  ? ? ? ?

## 2021-09-26 NOTE — Assessment & Plan Note (Addendum)
Initial MRI (5/4) was read as no acute stroke with severe motion artifact.  Repeat MRI (5/6) showed T2 enhancement and restricted diffusion in LEFT occipital and lateral temporal lobe, again motion artifact (this is in the setting of CTA head/neck showing LEFT PCA occlusion or severe stenosis, penumbra in the left PCA distribution as well as severe right P2 PCA stenosis). LDL of 71. Hemoglobin A1C of 5.4%. Transthoracic Echocardiogram significant for no evidence of thrombus orA atrial level shunt. Neurology recommendations for dual antiplatelet therapy with Aspirin 81 mg and Plavix 75 mg for three months, followed by Plavix monotherapy. PT/OT recommendations for inpatient rehab. Speech therapy recommendations for continued language/cognitition therapy. Patient discharged on Aspirin 81 mg (for three months), Plavix 75 mg daily and increased dose of Crestor 20 mg daily. Recommendation for Neurology follow-up in 4 weeks.  ?

## 2021-09-27 DIAGNOSIS — I639 Cerebral infarction, unspecified: Secondary | ICD-10-CM | POA: Diagnosis not present

## 2021-09-27 DIAGNOSIS — E872 Acidosis, unspecified: Secondary | ICD-10-CM

## 2021-09-27 DIAGNOSIS — G9341 Metabolic encephalopathy: Secondary | ICD-10-CM | POA: Diagnosis not present

## 2021-09-27 DIAGNOSIS — G3184 Mild cognitive impairment, so stated: Secondary | ICD-10-CM | POA: Diagnosis not present

## 2021-09-27 DIAGNOSIS — S098XXA Other specified injuries of head, initial encounter: Secondary | ICD-10-CM | POA: Diagnosis not present

## 2021-09-27 DIAGNOSIS — N390 Urinary tract infection, site not specified: Secondary | ICD-10-CM | POA: Diagnosis not present

## 2021-09-27 LAB — BLOOD GAS, VENOUS
Acid-base deficit: 5.5 mmol/L — ABNORMAL HIGH (ref 0.0–2.0)
Bicarbonate: 17.7 mmol/L — ABNORMAL LOW (ref 20.0–28.0)
Drawn by: 52800
O2 Saturation: 76.8 %
Patient temperature: 36.7
pCO2, Ven: 28 mmHg — ABNORMAL LOW (ref 44–60)
pH, Ven: 7.41 (ref 7.25–7.43)
pO2, Ven: 50 mmHg — ABNORMAL HIGH (ref 32–45)

## 2021-09-27 LAB — BASIC METABOLIC PANEL
Anion gap: 9 (ref 5–15)
BUN: 8 mg/dL (ref 8–23)
CO2: 15 mmol/L — ABNORMAL LOW (ref 22–32)
Calcium: 9.1 mg/dL (ref 8.9–10.3)
Chloride: 116 mmol/L — ABNORMAL HIGH (ref 98–111)
Creatinine, Ser: 1.4 mg/dL — ABNORMAL HIGH (ref 0.44–1.00)
GFR, Estimated: 40 mL/min — ABNORMAL LOW (ref >60)
Glucose, Bld: 80 mg/dL (ref 70–99)
Potassium: 3.5 mmol/L (ref 3.5–5.1)
Sodium: 140 mmol/L (ref 135–145)

## 2021-09-27 LAB — MAGNESIUM: Magnesium: 1.9 mg/dL (ref 1.7–2.4)

## 2021-09-27 MED ORDER — CLOPIDOGREL BISULFATE 75 MG PO TABS
75.0000 mg | ORAL_TABLET | Freq: Every day | ORAL | Status: DC
  Administered 2021-09-28 – 2021-09-29 (×2): 75 mg via ORAL
  Filled 2021-09-27 (×2): qty 1

## 2021-09-27 MED ORDER — ENOXAPARIN SODIUM 40 MG/0.4ML IJ SOSY
40.0000 mg | PREFILLED_SYRINGE | INTRAMUSCULAR | Status: DC
  Administered 2021-09-27 – 2021-09-29 (×3): 40 mg via SUBCUTANEOUS
  Filled 2021-09-27 (×3): qty 0.4

## 2021-09-27 NOTE — Progress Notes (Signed)
STROKE TEAM PROGRESS NOTE  ? ?INTERVAL HISTORY ?Patient is seen in her room with her brother at the bedside. Patient  is doing better today and was more interactive.  She remains aphasic with right hemianopsia.  No weakness .  Neurological exam is unchanged.  Vital signs are stable.  CT scan of the head with contrast done yesterday shows subacute left posterior division MCA infarct.  There is no  enhancement or underlying lesion noted. ?Vitals:  ? 09/27/21 0040 09/27/21 0452 09/27/21 0900 09/27/21 1144  ?BP: (!) 159/71 (!) 152/68 (!) 154/78 (!) 146/66  ?Pulse: 63 64 68 68  ?Resp: '16 16 18 20  ' ?Temp: 99.1 ?F (37.3 ?C) 98.1 ?F (36.7 ?C) 98.8 ?F (37.1 ?C) 97.7 ?F (36.5 ?C)  ?TempSrc: Oral  Axillary Oral  ?SpO2: 98% 98%  100%  ?Weight:      ? ?CBC:  ?Recent Labs  ?Lab 09/22/21 ?0900 09/22/21 ?5537 09/24/21 ?0500 09/26/21 ?0306  ?WBC 7.0   < > 7.6 5.9  ?NEUTROABS 4.9  --   --   --   ?HGB 12.9   < > 13.6 11.3*  ?HCT 40.8   < > 41.2 33.3*  ?MCV 98.6   < > 97.2 92.2  ?PLT 280   < > 280 242  ? < > = values in this interval not displayed.  ? ?Basic Metabolic Panel:  ?Recent Labs  ?Lab 09/26/21 ?0306 09/27/21 ?4827  ?NA 140 140  ?K 2.9* 3.5  ?CL 119* 116*  ?CO2 14* 15*  ?GLUCOSE 84 80  ?BUN 8 8  ?CREATININE 1.18* 1.40*  ?CALCIUM 7.2* 9.1  ?MG  --  1.9  ? ?Lipid Panel:  ?Recent Labs  ?Lab 09/26/21 ?0306  ?CHOL 112  ?TRIG 98  ?HDL 21*  ?CHOLHDL 5.3  ?VLDL 20  ?[place] 71  ? ?HgbA1c:  ?Recent Labs  ?Lab 09/25/21 ?1648  ?HGBA1C 5.4  ? ?Urine Drug Screen:  ?Recent Labs  ?Lab 09/22/21 ?0837  ?LABOPIA NONE DETECTED  ?COCAINSCRNUR NONE DETECTED  ?LABBENZ NONE DETECTED  ?AMPHETMU NONE DETECTED  ?THCU NONE DETECTED  ?LABBARB NONE DETECTED  ?  ?Alcohol Level  ?Recent Labs  ?Lab 09/22/21 ?0900  ?ETH <10  ? ? ?IMAGING past 24 hours ?CT HEAD W & WO CONTRAST (5MM) ? ?Result Date: 09/26/2021 ?CLINICAL DATA:  Stroke follow-up. EXAM: CT HEAD WITHOUT AND WITH CONTRAST TECHNIQUE: Contiguous axial images were obtained from the base of the skull  through the vertex without and with intravenous contrast. RADIATION DOSE REDUCTION: This exam was performed according to the departmental dose-optimization program which includes automated exposure control, adjustment of the mA and/or kV according to patient size and/or use of iterative reconstruction technique. CONTRAST:  3m OMNIPAQUE IOHEXOL 300 MG/ML  SOLN COMPARISON:  Head MRI 09/24/2021 FINDINGS: Brain: Edema in the left occipital and posterior left temporal lobes has increased but does not significantly extend beyond the restricted diffusion shown on the prior MRI. There is associated sulcal effacement and mild mass effect on the left lateral ventricle without midline shift or other significant mass effect. No acute intracranial hemorrhage, suspicious enhancement, mass, or extra-axial fluid collection is identified. Hypodensities elsewhere in the cerebral white matter bilaterally are nonspecific but compatible with extensive chronic small vessel ischemic disease. There are chronic lacunar infarcts in the basal ganglia regions bilaterally and in the right thalamus. There is mild cerebral atrophy. Vascular: Calcified atherosclerosis at the skull base. The major dural venous sinuses are grossly patent. Skull: No fracture. Numerous chronic lucent skull  lesions compatible with the patient's history of multiple myeloma. Sinuses/Orbits: Visualized paranasal sinuses and mastoid air cells are clear. Unremarkable orbits. Other: None. IMPRESSION: 1. Progressive cytotoxic edema in the left occipital and posterior left temporal lobes which may reflect an evolving infarct. No extension beyond the restricted diffusion on the recent MRI. No hemorrhage or suspicious enhancement. 2. Extensive chronic small vessel ischemic disease. Electronically Signed   By: Logan Bores M.D.   On: 09/26/2021 15:44   ? ?PHYSICAL EXAM ?General:  Alert, well-developed, well-nourished patient in no acute distress ?Respiratory:  Regular,  unlabored respirations on room air ? ?NEURO:  ?Mental Status: AA&Ox1, able to speak some words and phrases but expressive and receptive aphasia evident.  Speech is gibberish at times.  Will intermittently follow commands.  Blinks to threat on left but not on right.  Will move all extremities spontaneously against gravity equally.. ? ?ASSESSMENT/PLAN ?[name] is a 76 y.o. female with history of anemia, osteoarthritis, breast CA, multiple myeloma, T2DM, GERD, HSV esophagitis, stroke, TBI and multiple UTIs presenting with confusion, aphasia and difficulty walking which occurred on Thursday.  On Saturday, she had left gaze deviation and right sided neglect.  MRI on 5/6 revealed swelling and restricted diffusion in the left occipital and posterior temporal lobes, likely MCA infarct in setting of severe PCA stenosis.  LP was performed 5/7 due to concern for cerebritis, but cell count simply demonstrated a traumatic tap. HSV PCR pending. She has been evaluated for seizures with LTM. ? ?Stroke:  left MCA infarct in occipital and temporal lobes likely secondary to intracranial atherosclerosis.  Code Stroke CT head No acute abnormality. Small vessel disease. ASPECTS 10.    ?CTA head & neck occlusion or severe stenosis of left P2 PCA, severe right P2 PCA stenosis, moderate bilateral M2 MCA stenosis, right A2 ACA stenosis ?CT perfusion 22 mL penumbra in left PCA territory ?MRI  5/4 motion degraded, no acute infarction ?MRI 5/6 motion degraded. Swelling and restricted diffusion in left occipital and posterior temporal lobe ?CT head with contrast pending ?2D Echo EF 65-70%, mild LVH, grade 1 diastolic dysfunction, no atrial level shunt ?LDL 71 ?HgbA1c 5.4 ?VTE prophylaxis - SCDs ?   ?Diet  ? DIET DYS 3 Room service appropriate? Yes; Fluid consistency: Thin  ? ?aspirin 81 mg daily prior to admission, now on aspirin 81 mg daily.  And Plavix 75 mg daily for 3 months and then Plavix alone ?Therapy recommendations:  CIR   ?Disposition:  pending ? ?Hypertension ?Home meds:  amlodipine 10 mg daily, atenolol 100 mg daily ?Stable ?Permissive hypertension (OK if < 220/120) but gradually normalize in 5-7 days ?Long-term BP goal normotensive ? ?Hyperlipidemia ?Home meds:  rosuvastatin 10 mg daily, increased to 20 mg daily ?LDL 71, goal < 70 ?Continue statin at discharge ? ?Concern for cerebritis ?Swelling seen on MRI suspicious for cerebritis ?LP performed 5/7, cell count shows traumatic tap ?HSV PCR pending ? ?Other Stroke Risk Factors ?Advanced Age >/= 59  ? ?Other Active Problems ?Baseline dementia ?Continue home memantine and donepezil ? ?Hospital day # 4 ? ? Patient presented with confusion and altered mental status initially thought to be encephalitis but CT scan shows left posterior MCA region infarct etiology likely intracranial atherosclerosis.  Recommend aspirin and Plavix for 3 months followed by Plavix alone symptomatic intracranial stenosis.  Continue ongoing therapies.  Long discussion at bedside with patient and her brother and sister-in-law r and answered questions.  Discussed with Dr. Loleta Books.  Greater  than 50% time during this 35-minute visit is spent in counseling coordination of care about her stroke and aphasia and answering questions.  Follow-up as an outpatient stroke clinic in 2 months.  Stroke team will sign off.  Kindly call for questions. ? ?Antony Contras, MD ?Medical Director ?[facility] ?Pager: [phone] ?09/27/2021 12:41 PM ? ? ?To contact Stroke Continuity provider, please refer to [email or web address] ?After hours, contact General Neurology  ?

## 2021-09-27 NOTE — TOC Progression Note (Signed)
Transition of Care (TOC) - Progression Note  ? ? ?Patient Details  ?Name: Betti Goodenow [place] ?MRN: [id number] ?Date of Birth: Jul 28, 1945 ? ?Transition of Care (TOC) CM/SW Contact  ?Pollie Friar, RN ?Phone Number: ?09/27/2021, 3:49 PM ? ?Clinical Narrative:    ?[facility] IR has approved an admission and started insurance auth. Daughter updated.  ?TOC following. ? ? ?Expected Discharge Plan: [place] ?Barriers to Discharge: Continued Medical Work up ? ?Expected Discharge Plan and Services ?Expected Discharge Plan: [place] ?  ?Discharge Planning Services: CM Consult ?Post Acute Care Choice: IP Rehab ?Living arrangements for the past 2 months: [place] ?                ?  ?  ?  ?  ?  ?  ?  ?  ?  ?  ? ? ?Social Determinants of Health (SDOH) Interventions ?  ? ?Readmission Risk Interventions ?   ? View : No data to display.  ?  ?  ?  ? ? ?

## 2021-09-27 NOTE — Progress Notes (Signed)
Physical Therapy Treatment ?Patient Details ?Name: [name] [place] ?MRN: [id number] ?DOB: 1945/07/06 ?Today's Date: 09/27/2021 ? ? ?History of Present Illness [name] [place] is a 76 y.o. female who presents from ED s/p waking up with confusion, generalized weakness, unsteady gait with difficulty walking, aphasia with slurred speech. PMH: anemia, osteoarthritis, breast cancer, multiple myeloma, history of radiation therapy, , type II DM, E. 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 ?  ?PT Comments  ? ? Pt with more intelligible speech this date however majority remains muffled and garbled. Pt able to tolerate gait training this date but benefited more from tactile cues face to face vs use of walker. Pt remains to have severe R neglect despite using R UE and LE equally to the L. Continue to recommend AIR upon d/c for maximal functional recovery. Acute PT to cont to follow. ?  ?Recommendations for follow up therapy are one component of a multi-disciplinary discharge planning process, led by the attending physician.  Recommendations may be updated based on patient status, additional functional criteria and insurance authorization. ? ?Follow Up Recommendations ? Acute inpatient rehab (3hours/day) ?  ?  ?Assistance Recommended at Discharge Frequent or constant Supervision/Assistance  ?Patient can return home with the following A lot of help with walking and/or transfers;A lot of help with bathing/dressing/bathroom;Direct supervision/assist for medications management;Direct supervision/assist for financial management;Assist for transportation;Help with stairs or ramp for entrance;Assistance with cooking/housework ?  ?Equipment Recommendations ? Rolling walker (2 wheels)  ?  ?Recommendations for Other Services Rehab consult ? ? ?  ?Precautions / Restrictions Precautions ?Precautions: Fall ?Precaution  Comments: R inattention, incontinence, garbled speech ?Restrictions ?Weight Bearing Restrictions: No  ?  ? ?Mobility ? Bed Mobility ?Overal bed mobility: Needs Assistance ?Bed Mobility: Supine to Sit ?  ?  ?Supine to sit: Min assist ?  ?  ?General bed mobility comments: pt initiated movement to L side despite bed rail being up. once PT provided tactile cues to go R pt then minA to bring self EOB using bilat UEs equally ?  ? ?Transfers ?Overall transfer level: Needs assistance ?Equipment used: Rolling walker (2 wheels) ?Transfers: Sit to/from Stand ?Sit to Stand: Mod assist ?  ?Step pivot transfers: Max assist ?  ?  ?  ?General transfer comment: pt min/modA to power up with tactile cues, pt maxA when completing step transfer to the [facility] when going to the R vs the L. Pt modA when going to the L ?  ? ?Ambulation/Gait ?Ambulation/Gait assistance: Mod assist, Max assist, +2 safety/equipment ?Gait Distance (Feet): 50 Feet (x2) ?Assistive device: Rolling walker (2 wheels), 1 person hand held assist ?Gait Pattern/deviations: Step-to pattern, Step-through pattern, Decreased stride length, Trunk flexed ?Gait velocity: decreased ?  ?  ?General Gait Details: attempted to first ambulate with RW however pt requiring maxA to manage RW and maintain forward progression. PT transitioned to providing pt with bilat HHA anteriorly (face to face) to allow for PT to control/promote anterior movement and weight shifting L/R to help with sequencing steps. Pt more fluid when given face to face assist vs RW ? ? ?Stairs ?  ?  ?  ?  ?  ? ? ?Wheelchair Mobility ?  ? ?Modified Rankin (Stroke Patients Only) ?Modified Rankin (Stroke Patients Only) ?Pre-Morbid Rankin Score: Moderate disability ?Modified Rankin: Moderately severe disability ? ? ?  ?Balance Overall balance assessment: Needs assistance ?Sitting-balance  support: Feet supported, No upper extremity supported ?Sitting balance-Leahy Scale: Good ?  ?  ?Standing balance support: Single  extremity supported, During functional activity ?Standing balance-Leahy Scale: Poor ?Standing balance comment: pt dependent on external assist ?  ?  ?  ?  ?  ?  ?  ?  ?  ?  ?  ?  ? ?  ?Cognition Arousal/Alertness: Awake/alert ?Behavior During Therapy: Flat affect ?Overall Cognitive Status: Impaired/Different from baseline ?Area of Impairment: Attention, Memory, Following commands, Awareness, Problem solving, Safety/judgement ?  ?  ?  ?  ?  ?  ?  ?  ?  ?Current Attention Level: Focused ?Memory: Decreased short-term memory ?Following Commands: Follows one step commands with increased time, Follows one step commands inconsistently ?Safety/Judgement: Decreased awareness of safety, Decreased awareness of deficits ?Awareness: Intellectual ?Problem Solving: Slow processing, Difficulty sequencing, Requires verbal cues, Requires tactile cues ?General Comments: able to recognize a third of the words pt attempted to state, pt continues to be unable to tend to the R, even when head manually turned to the R, eyes stay in L gaze ?  ?  ? ?  ?Exercises   ? ?  ?General Comments General comments (skin integrity, edema, etc.): VSS, assist to commode however pt didn't urinate or have BM ?  ?  ? ?Pertinent Vitals/Pain Pain Assessment ?Pain Assessment: Faces ?Faces Pain Scale: No hurt  ? ? ?Home Living   ?  ?  ?  ?  ?  ?  ?  ?  ?  ?   ?  ?Prior Function    ?  ?  ?   ? ?PT Goals (current goals can now be found in the care plan section) Acute Rehab PT Goals ?PT Goal Formulation: With patient/family ?Time For Goal Achievement: 10/10/21 ?Potential to Achieve Goals: Good ?Progress towards PT goals: Progressing toward goals ? ?  ?Frequency ? ? ? Min 4X/week ? ? ? ?  ?PT Plan Current plan remains appropriate  ? ? ?Co-evaluation   ?  ?  ?  ?  ? ?  ?AM-PAC PT "6 Clicks" Mobility   ?Outcome Measure ? Help needed turning from your back to your side while in a flat bed without using bedrails?: A Little ?Help needed moving from lying on your back to  sitting on the side of a flat bed without using bedrails?: A Little ?Help needed moving to and from a bed to a chair (including a wheelchair)?: A Lot ?Help needed standing up from a chair using your arms (e.g., wheelchair or bedside chair)?: A Lot ?Help needed to walk in hospital room?: A Lot ?Help needed climbing 3-5 steps with a railing? : A Lot ?6 Click Score: 14 ? ?  ?End of Session Equipment Utilized During Treatment: Gait belt ?Activity Tolerance: Patient tolerated treatment well ?Patient left: in chair;with call bell/phone within reach;with family/visitor present ?Nurse Communication: Mobility status ?PT Visit Diagnosis: Unsteadiness on feet (R26.81);Muscle weakness (generalized) (M62.81);Difficulty in walking, not elsewhere classified (R26.2) ?  ? ? ?Time: 8850-2774 ?PT Time Calculation (min) (ACUTE ONLY): 24 min ? ?Charges:  $Gait Training: 23-37 mins          ?          ? ?Kittie Plater, PT, DPT ?Acute Rehabilitation Services ?Secure chat preferred ?Office #: [phone] ? ? ? ?Maximillian Habibi M Wilkin Lippy ?09/27/2021, 4:01 PM ? ?

## 2021-09-27 NOTE — Progress Notes (Signed)
?Progress Note ? ? ?[facility]Braylen [name][id number] DOB: 1946-02-09 DOA: 09/22/2021     4 ?DOS: the patient was seen and examined on 09/27/2021 at 4:14PM ?  ? ? ? ?Brief [name] course: ?[name] is a 76 y.o. F with brain injury with resulting cognitive impairment/dementia, diabetes, breast cancer, multiple myeloma, renal mass who presented with confusion. ? ? ?5/4: Admitted with aphasia, confusion, MRI brain normal ?5/5: EEG without definite seizure, remains confused ?5/6: Still no improvement, MRI brain repeated --> temporal lobe enhancement, Acyclovir and antibiotics started, transferred to [facility] for LP and cEEG ?5/7: LP traumatic tap ?5/8: Re-review of MRI suggests stroke, antibiotics stopped, HSV negative, acyclovir stopped, CT ordered ? ? ? ? ?Assessment and Plan: ?* Acute ischemic stroke ([place]) ?Initial MRI was read as no stroke and has severe motion artifact.  Repeat MRI on 5/6 showed T2 enhancement and restricted diffusion in LEFT occipital and lateral temporal lobe, again motion artifact (this is in the setting of CTA showing LEFT PCA occlusion or severe stenosis, penumbra in the left PCA distribution as well as severe right P2 PCA stenosis). ? ?On re-review by Neurology, there does appear to be some restricted diffusion in the initial MRI, and the second MRI has some appearance of stroke, but also with cerebritis due to paraneoplastic syndrome or infection in the differential. ? ?LP under fluoroscopy was traumatic, and low volume, but did not have a pleocytosis to suggest infection, so antibiotics have been stopped, and HSV PCR negative so acyclovir stopped. ? ?Slowly improving in the last few days ?- Non-invasive angiography showed above PCA stenoses but also bilateral M2 MCA stenoses and right A2 ACA stenosis, <50% carotid stenosis ?- Echocardiogram showed no cardiogenic source of embolism ?- Lipids ordered, continue atorvastatin ?- Continue aspirin ?- Add Plavix for 3 months and then Plavix alone  indefinitely ?  ? ?Acute metabolic encephalopathy ?At baseline uses cane, independent with feeding/toileting, interactive and recognizes family members and engages in conversation. ? ?On presentation was confused, disoriented. ? ?MRI brain initially read as normal and there was a strong clinical suspicion for metabolic encephalopathy due to UTI.  Started on antibiotics, fluids. ? ?Over 48 hours, no improvement.  Continued to have decreased mentation, aphasia and left-ward gaze preference.  Repeat MRI brain showed temporal and occipital lobe enhancement consistent with ischemia or infection (as above).  Started on antibiotics, acyclovir and transferred to our [name] for comprehensive neurological evaluation ? ?LTM EEG normal.  LP obtained and of poor quality, but HSV ruled out.  Patient improving today, and clinical suspicion for bacterial meningitis low.  Antibiotics and acyclovir stopped.  Neurology ordered repeat CT head yesterday, and feel that there is a high degree of confidence that she did not have infectious or paraneoplastic cerebritis and that this was stroke. ?  ? ?Metabolic acidosis ?Non-gap.  No diarrhea.  VBG without acidosis.   ? ? ?Hypokalemia ?Repleted and resolved ? ? ?Hypocalcemia ?Repleted and resolved ? ?B12 deficiency ?- Continue vitamin B12 ? ?Hyperlipidemia ?- Continue Crestor ? ?Hypertension ?Blood pressure normal ?- Continue amlodipine, atenolol ? ?Left kidney mass ?- Outpatient urology follow-up referral already in the works ? ?Acute UTI (urinary tract infection) ?Urine culture growing E. Coli.  Completed 5 days cephalosporin.  Afebrile, HR and WBC normal. ? ?History of multiple strokes ?See above ? ?Aphasia with TBI (traumatic brain injury), closed ?  ? ?Amnestic MCI (mild cognitive impairment with memory loss) ?- Continue donepezil, Megace, memantine ? ?Stage 3b chronic kidney  disease (CKD) ([place]) ?Creatinine stable relative to baseline ? ?Depression ?   ? ?Myeloma ([place]) ?No active  disease, oncology have evaluated the patient ? ? ? ? ? ? ? ? ? ?Subjective: Patient nonverbal.  Nursing report no concerns. NO fever, norespiratory distress, no seizures. Mentation seems to be improving. ? ? ? ? ?Physical Exam: ?Vitals:  ? 09/27/21 0900 09/27/21 1144 09/27/21 1541 09/27/21 1543  ?BP: (!) 154/78 (!) 146/66 (!) 171/70 (!) 162/71  ?Pulse: 68 68 77 77  ?Resp: _0 ?Temp: 98.8 ?F (37.1 ?C) 97.7 ?F (36.5 ?C) 97.9 ?F (36.6 ?C) 98 ?F (36.7 ?C)  ?TempSrc: Axillary Oral Oral Oral  ?SpO2:  100% 100% 98%  ?Weight:      ? ?Elderly adult female, lying in bed, makes eye contact, attentive to my conversation but verbalizations are unintelligible ?RRR, no murmurs, no peripheral edema ?Respiratory rate normal, lungs clear without rales or wheezes ?She has some mild right-sided weakness, speech is unintelligible, face symmetric ? ?Data Reviewed: ?Discussed with neurology, nursing notes reviewed, vital signs reviewed ?Basic metabolic panel, LDL reviewed ? ?Family Communication: Daughter at the bedside ? ? ? ?Disposition: ?Status is: Inpatient ?Remains inpatient appropriate because: Patient has had what appears to be a stroke. ? ?She is significantly impaired and will need significant rehabilitation return to her prior level of function, she is medically stable for discharge to rehab when bed is available ? ? ? ? ? ? ? ?Author: ?Edwin Dada, MD ?09/27/2021 4:38 PM ? ?For on call review [email or web address].  ? ? ?

## 2021-09-27 NOTE — Assessment & Plan Note (Addendum)
Non-anion gap.  No diarrhea.  VBG without acidosis. Likely related to kidney disease. Discharged with sodium bicarbonate. Recheck BMP. ?

## 2021-09-27 NOTE — Plan of Care (Signed)
?  Problem: Education: ?Goal: Knowledge of patient specific risk factors will improve (INDIVIDUALIZE FOR PATIENT) ?Outcome: Progressing ?  ?Problem: Self-Care: ?Goal: Ability to participate in self-care as condition permits will improve ?Outcome: Progressing ?  ?Problem: Nutrition: ?Goal: Risk of aspiration will decrease ?Outcome: Progressing ?  ?Problem: Ischemic Stroke/TIA Tissue Perfusion: ?Goal: Complications of ischemic stroke/TIA will be minimized ?Outcome: Progressing ?  ?

## 2021-09-27 NOTE — Progress Notes (Signed)
Speech Language Pathology Treatment: Cognitive-Linquistic  ?Patient Details ?Name: [name] [place] ?MRN: [id number] ?DOB: [birth year] ?Today's Date: 09/27/2021 ?Time: 1005-1025 ?SLP Time Calculation (min) (ACUTE ONLY): 20 min ? ?Assessment / Plan / Recommendation ?Clinical Impression ? Patient seen by SLP for skilled treatment session focused on cognitive-linguistic goals. Patient was awake and alert, sitting up in recliner when SLP arrived. She continues with strong left gaze preference. Patient was able to respond to basic level questions related to very immediate wants/needs, such as SLP asking her if she wanted help removing residue from medical tape, asking her if she wanted something to drink, etc. She could not respond to any yes/no or open ended questions that were even mildly abstract and she required mod-max verbal and tactile cues for following basic commands such as "let go" (of cup). She did not demonstrate ability to visual focus on objects, did not track SLP's finger. When SLP asked her what color her fingernail polish was, after multiple verbal cues to redirect attention, she did say, "brown, light". (fingernails were painted pink and unable to determine if patient indeed was talking about her skin tone which could be considered light brown. Patient's speech was dysarthric but in addition, her language content was significantly impaired with suspected expressive aphasia as well as confusion from suspected dementia. Overall, patient is exhibiting significant improvements in alertness, is starting to interact more with environment and is not as fidgety/seems more calm than she has been, she continues to require significant cues to communicate basic needs and perform basic level tasks. SLP will continue to follow patient while in acute care for speech-language, cognition and swallow function. ? ?  ?HPI HPI: PT is a 76 y.o. female with medical history significant of anemia, osteoarthritis, breast cancer,  multiple myeloma, history of radiation therapy, , type II DM, E. 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  CT head and MRI brain negative for acute changes.  Daughter admits pt had language deficits after her TBI.  Pt did not pass 3 ounce Yale swallow screen, thus SLP swallow eval ordered.    [name], daughter, reports prior to admit, pt was holding food in her mouth and her appetite has been poor. Patient evaluated for swallow by SLP on 09/23/21 and Dys 3 (mechanical soft) solids and thin liquids recommended with no f/u recommended. On 09/24/21, rapid response called and stat MRI ordered secondary to appearance of new onset stroke symptoms. Repeat brain MRI on 09/24/21 showed findings with concern for ischemic infarction or cerebritis like herpes encephalitis. (Brain MRI on 09/22/21 showed no acute infarction but brain MRI on 5/7 showed "Swelling, increased T2 signal and restricted diffusion in the left occipital lobe and posterior temporal lobe, lateral more than medial. This does not appear to represent a typical posterior cerebral artery distribution. Although this could represent ischemic  infarction, consider cerebritis including herpes encephalitis". ?  ?   ?SLP Plan ? Continue with current plan of care ? ?  ?  ?Recommendations for follow up therapy are one component of a multi-disciplinary discharge planning process, led by the attending physician.  Recommendations may be updated based on patient status, additional functional criteria and insurance authorization. ?  ? ?Recommendations  ?   ?   ?    ?   ? ? ? ? Assistance recommended at discharge:  Frequent or constant Supervision/Assistance ?SLP Visit Diagnosis: Cognitive communication deficit (R41.841) ?Plan: Continue with current plan of care ? ? ? ? ?  ?  ? ?Sonia Baller,  MA, CCC-SLP ?Speech Therapy ? ?

## 2021-09-28 DIAGNOSIS — I639 Cerebral infarction, unspecified: Secondary | ICD-10-CM | POA: Diagnosis not present

## 2021-09-28 DIAGNOSIS — S098XXA Other specified injuries of head, initial encounter: Secondary | ICD-10-CM | POA: Diagnosis not present

## 2021-09-28 DIAGNOSIS — N390 Urinary tract infection, site not specified: Secondary | ICD-10-CM | POA: Diagnosis not present

## 2021-09-28 DIAGNOSIS — R4701 Aphasia: Secondary | ICD-10-CM | POA: Diagnosis not present

## 2021-09-28 LAB — COMPREHENSIVE METABOLIC PANEL
ALT: 15 U/L (ref 0–44)
AST: 20 U/L (ref 15–41)
Albumin: 3.2 g/dL — ABNORMAL LOW (ref 3.5–5.0)
Alkaline Phosphatase: 57 U/L (ref 38–126)
Anion gap: 7 (ref 5–15)
BUN: 9 mg/dL (ref 8–23)
CO2: 17 mmol/L — ABNORMAL LOW (ref 22–32)
Calcium: 8.9 mg/dL (ref 8.9–10.3)
Chloride: 115 mmol/L — ABNORMAL HIGH (ref 98–111)
Creatinine, Ser: 1.54 mg/dL — ABNORMAL HIGH (ref 0.44–1.00)
GFR, Estimated: 35 mL/min — ABNORMAL LOW (ref >60)
Glucose, Bld: 97 mg/dL (ref 70–99)
Potassium: 3.4 mmol/L — ABNORMAL LOW (ref 3.5–5.1)
Sodium: 139 mmol/L (ref 135–145)
Total Bilirubin: 0.6 mg/dL (ref 0.3–1.2)
Total Protein: 6.8 g/dL (ref 6.5–8.1)

## 2021-09-28 LAB — CBC
HCT: 36.9 % (ref 36.0–46.0)
Hemoglobin: 12.4 g/dL (ref 12.0–15.0)
MCH: 31.5 pg (ref 26.0–34.0)
MCHC: 33.6 g/dL (ref 30.0–36.0)
MCV: 93.7 fL (ref 80.0–100.0)
Platelets: 247 10*3/uL (ref 150–400)
RBC: 3.94 MIL/uL (ref 3.87–5.11)
RDW: 12.9 % (ref 11.5–15.5)
WBC: 7.4 10*3/uL (ref 4.0–10.5)
nRBC: 0 % (ref 0.0–0.2)

## 2021-09-28 MED ORDER — VITAMIN B-12 1000 MCG PO TABS
1000.0000 ug | ORAL_TABLET | Freq: Every day | ORAL | Status: AC
Start: 2021-09-28 — End: ?

## 2021-09-28 MED ORDER — ASPIRIN 81 MG PO TBEC: 81.0000 mg | DELAYED_RELEASE_TABLET | Freq: Every day | ORAL | Status: AC

## 2021-09-28 MED ORDER — ROSUVASTATIN CALCIUM 20 MG PO TABS
20.0000 mg | ORAL_TABLET | Freq: Every day | ORAL | Status: AC
Start: 2021-09-29 — End: ?

## 2021-09-28 MED ORDER — CLOPIDOGREL BISULFATE 75 MG PO TABS: 75.0000 mg | ORAL_TABLET | Freq: Every day | ORAL | Status: AC

## 2021-09-28 MED ORDER — POTASSIUM CHLORIDE CRYS ER 20 MEQ PO TBCR
40.0000 meq | EXTENDED_RELEASE_TABLET | Freq: Once | ORAL | Status: AC
  Administered 2021-09-28: 40 meq via ORAL
  Filled 2021-09-28: qty 2

## 2021-09-28 MED ORDER — PANTOPRAZOLE SODIUM 40 MG PO TBEC: 40.0000 mg | DELAYED_RELEASE_TABLET | Freq: Every day | ORAL | Status: AC

## 2021-09-28 NOTE — Progress Notes (Signed)
Occupational Therapy Treatment ?Patient Details ?Name: [name] ?MRN: [id number] ?DOB: 1945-10-13 ?Today's Date: 09/28/2021 ? ? ?History of present illness [name] is a 76 y.o. female who presents from ED s/p waking up with confusion, generalized weakness, unsteady gait with difficulty walking, aphasia with slurred speech. MRI showing edema in L occipital parietal lobe, stroke workup ongoing. PMH: anemia, osteoarthritis, breast cancer, multiple myeloma, history of radiation therapy, , type II DM, E. 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  ?OT comments ? Pt progressing towards goals, continues to display R inattention/L gaze. Pt following ~30% of commands during session with increased time and cuing. Pt completes seated grooming task at sink using BUE equally with mod A, benefits from hand over hand assistance/tactile cuing for task initiation.Pt completes sit to stand transfer x1, with mod A +2 for safety, manual facilitation at low back needed for stand. Pt provided with glasses with L side of each frame occluded to promote midline gaze/orientation, pt starting to shift gaze to midline. Pt presenting with impairments listed below, will follow acutely. Recommend AIR at d/c, noted pt preferring [facility] in [place].  ? ?Recommendations for follow up therapy are one component of a multi-disciplinary discharge planning process, led by the attending physician.  Recommendations may be updated based on patient status, additional functional criteria and insurance authorization. ?   ?Follow Up Recommendations ? Acute inpatient rehab (3hours/day) (family interested in rehab in [place] vs [facility])  ?  ?Assistance Recommended at Discharge Frequent or constant Supervision/Assistance  ?Patient can return home with the following ? Two people to help with walking and/or transfers;A lot of help  with bathing/dressing/bathroom;Assistance with cooking/housework;Direct supervision/assist for medications management;Direct supervision/assist for financial management;Help with stairs or ramp for entrance;Assist for transportation;Assistance with feeding ?  ?Equipment Recommendations ? Other (comment) (to be further assessed in future sessions)  ?  ?Recommendations for Other Services Rehab consult ? ?  ?Precautions / Restrictions Precautions ?Precautions: Fall ?Precaution Comments: R inattention, incontinence, garbled speech ?Restrictions ?Weight Bearing Restrictions: No  ? ? ?  ? ?Mobility Bed Mobility ?  ?  ?  ?  ?  ?  ?  ?General bed mobility comments: OOB in chair upon arrival ?  ? ?Transfers ?Overall transfer level: Needs assistance ?Equipment used: 2 person hand held assist ?Transfers: Sit to/from Stand ?Sit to Stand: Mod assist, +2 safety/equipment ?  ?  ?  ?  ?  ?General transfer comment: manual facilitation on low back for standing from chair level ?  ?  ?Balance Overall balance assessment: Needs assistance ?Sitting-balance support: Feet supported, No upper extremity supported ?Sitting balance-Leahy Scale: Good ?Sitting balance - Comments: can sit up in chair without back support ?  ?Standing balance support: Single extremity supported, During functional activity ?Standing balance-Leahy Scale: Poor ?Standing balance comment: pt dependent on external assist ?  ?  ?  ?  ?  ?  ?  ?  ?  ?  ?  ?   ? ?ADL either performed or assessed with clinical judgement  ? ?ADL Overall ADL's : Needs assistance/impaired ?Eating/Feeding: Total assistance;Sitting ?Eating/Feeding Details (indicate cue type and reason): unable to bring hand all the way to mouth to self feed ?Grooming: Wash/dry hands;Moderate assistance ?Grooming Details (indicate cue type and reason): visual and hand over hand assistance to guide pt to wash and apply lotion to BUE ?  ?  ?  ?  ?  ?  ?  ?  ?  ?  ?  ?  ?  ?  ?  Functional mobility during ADLs:  Moderate assistance;+2 for safety/equipment ?  ?  ? ?Extremity/Trunk Assessment Upper Extremity Assessment ?Upper Extremity Assessment: RUE deficits/detail;Generalized weakness;LUE deficits/detail ?RUE Deficits / Details: ~100* shoulder AROM with increased tactile cuing ?RUE Coordination: decreased fine motor;decreased gross motor ?LUE Deficits / Details: ~100* shoulder AROM, can grasp release modA with manual facilitation to close into a fist, requires increased time to release ?LUE Coordination: decreased gross motor;decreased fine motor ?  ?Lower Extremity Assessment ?Lower Extremity Assessment: Defer to PT evaluation ?  ?  ?  ? ?Vision   ?Vision Assessment?: Vision impaired- to be further tested in functional context ?Additional Comments: L gaze, administered glasses with L side of visual field occluded, with increased time pt starting to shift gaze toward midline ?  ?Perception Perception ?Perception: Impaired (R inattention) ?  ?Praxis Praxis ?Praxis: Impaired ?Praxis Impairment Details: Motor planning;Initiation ?Praxis-Other Comments: increased time and verbal/tactile cuing needed to come to edge of chair, hand over hand assistance for self feeding and grooming task ?  ? ?Cognition Arousal/Alertness: Awake/alert ?Behavior During Therapy: Flat affect ?Overall Cognitive Status: Impaired/Different from baseline ?Area of Impairment: Attention, Following commands, Awareness, Problem solving, Safety/judgement ?  ?  ?  ?  ?  ?  ?  ?  ?  ?Current Attention Level: Focused ?  ?Following Commands: Follows one step commands with increased time, Follows one step commands inconsistently ?Safety/Judgement: Decreased awareness of safety, Decreased awareness of deficits ?Awareness: Intellectual ?Problem Solving: Slow processing, Difficulty sequencing, Requires verbal cues, Requires tactile cues ?  ?  ?  ?   ?Exercises   ? ?  ?Shoulder Instructions   ? ? ?  ?General Comments VSS on RA, family present in room intermittently  during session, provided education on use of glasses to remain on unless pt takes them off  ? ? ?Pertinent Vitals/ Pain       Pain Assessment ?Pain Assessment: Faces ?Pain Score: 0-No pain ?Faces Pain Scale: No hurt ? ?Home Living   ?  ?  ?  ?  ?  ?  ?  ?  ?  ?  ?  ?  ?  ?  ?  ?  ?  ?  ? ?  ?Prior Functioning/Environment    ?  ?  ?  ?   ? ?Frequency ? Min 3X/week  ? ? ? ? ?  ?Progress Toward Goals ? ?OT Goals(current goals can now be found in the care plan section) ? Progress towards OT goals: Progressing toward goals ? ?Acute Rehab OT Goals ?Patient Stated Goal: none stated ?OT Goal Formulation: Patient unable to participate in goal setting ?Time For Goal Achievement: 10/10/21 ?Potential to Achieve Goals: Fair ?ADL Goals ?Pt Will Perform Grooming: with min assist;sitting;standing ?Pt Will Transfer to Toilet: with min assist;stand pivot transfer;bedside commode ?Additional ADL Goal #1: Pt will follow single step command accurately 75% of the time in prep for ADLs  ?Plan Discharge plan remains appropriate;Frequency remains appropriate   ? ?Co-evaluation ? ? ?   ?  ?  ?  ?  ? ?  ?AM-PAC OT "6 Clicks" Daily Activity     ?Outcome Measure ? ? Help from another person eating meals?: Total ?Help from another person taking care of personal grooming?: A Lot ?Help from another person toileting, which includes using toliet, bedpan, or urinal?: Total ?Help from another person bathing (including washing, rinsing, drying)?: A Lot ?Help from another person to put on and taking off regular upper body clothing?: A  Lot ?Help from another person to put on and taking off regular lower body clothing?: Total ?6 Click Score: 9 ? ?  ?End of Session   ? ?OT Visit Diagnosis: Unsteadiness on feet (R26.81);Other abnormalities of gait and mobility (R26.89);Muscle weakness (generalized) (M62.81);Other symptoms and signs involving cognitive function;Cognitive communication deficit (R41.841) ?Symptoms and signs involving cognitive functions: Other  cerebrovascular disease ?  ?Activity Tolerance Patient tolerated treatment well ?  ?Patient Left in chair;with call bell/phone within reach;with chair alarm set;with family/visitor present ?  ?[facility] [employer]

## 2021-09-28 NOTE — Progress Notes (Signed)
? ?PROGRESS NOTE ? ? ? ?[name] [name]  TJQ:[id number] DOB: 1945-09-08 DOA: 09/22/2021 ?PCP: [name], MD ? ? ?Brief Narrative: ?[name] is a 76 y.o. F with brain injury with resulting cognitive impairment/dementia, diabetes, breast cancer, multiple myeloma, renal mass who presented with confusion. ? ? ?5/4: Admitted with aphasia, confusion, MRI brain normal ?5/5: EEG without definite seizure, remains confused ?5/6: Still no improvement, MRI brain repeated --> temporal lobe enhancement, Acyclovir and antibiotics started, transferred to [facility] for LP and cEEG ?5/7: LP traumatic tap ?5/8: Re-review of MRI suggests stroke, antibiotics stopped, HSV negative, acyclovir stopped, CT ordered ? ? ?Assessment and Plan: ?* Acute ischemic stroke ([place]) ?Initial MRI was read as no stroke and has severe motion artifact.  Repeat MRI on 5/6 showed T2 enhancement and restricted diffusion in LEFT occipital and lateral temporal lobe, again motion artifact (this is in the setting of CTA showing LEFT PCA occlusion or severe stenosis, penumbra in the left PCA distribution as well as severe right P2 PCA stenosis). ? ?On re-review by Neurology, there does appear to be some restricted diffusion in the initial MRI, and the second MRI has some appearance of stroke, but also with cerebritis due to paraneoplastic syndrome or infection in the differential. ? ?LP under fluoroscopy was traumatic, and low volume, but did not have a pleocytosis to suggest infection, so antibiotics have been stopped, and HSV PCR negative so acyclovir stopped. ? ?Slowly improving in the last few days ?- Non-invasive angiography showed above PCA stenoses but also bilateral M2 MCA stenoses and right A2 ACA stenosis, <50% carotid stenosis ?- Echocardiogram showed no cardiogenic source of embolism ?- Lipids ordered, continue atorvastatin ?- Continue aspirin x3 months ?- Plavix alone indefinitely ?  ? ?Acute metabolic encephalopathy ?At baseline uses cane, independent  with feeding/toileting, interactive and recognizes family members and engages in conversation. ? ?On presentation was confused, disoriented. ? ?MRI brain initially read as normal and there was a strong clinical suspicion for metabolic encephalopathy due to UTI.  Started on antibiotics, fluids. ? ?Over 48 hours, no improvement.  Continued to have decreased mentation, aphasia and left-ward gaze preference.  Repeat MRI brain showed temporal and occipital lobe enhancement consistent with ischemia or infection (as above).  Started on antibiotics, acyclovir and transferred to our hospital for comprehensive neurological evaluation ? ?LTM EEG normal.  LP obtained and of poor quality, but HSV ruled out.  Patient improving today, and clinical suspicion for bacterial meningitis low.  Antibiotics and acyclovir stopped.  Neurology ordered repeat CT head yesterday, and feel that there is a high degree of confidence that she did not have infectious or paraneoplastic cerebritis and that this was stroke. ?  ? ?Metabolic acidosis ?Non-gap.  No diarrhea.  VBG without acidosis.   ? ? ?Hypokalemia ?Repleted and resolved ? ? ?Hypocalcemia ?Repleted and resolved ? ?B12 deficiency ?- Continue vitamin B12 ? ?Hyperlipidemia ?- Continue Crestor ? ?Hypertension ?Slightly uncontrolled. ?- Continue amlodipine, atenolol ? ?Left kidney mass ?- Outpatient urology follow-up referral already placed. ? ?Acute UTI (urinary tract infection) ?Urine culture growing E. Coli.  Completed 5 days cephalosporin.  Afebrile, HR and WBC normal. ? ?Aphasia with TBI (traumatic brain injury), closed ?  ? ?Amnestic MCI (mild cognitive impairment with memory loss) ?- Continue donepezil, Megace, memantine ? ?Stage 3b chronic kidney disease (CKD) ([place]) ?Baseline creatinine of about 1.4-1.5. Creatinine stable relative to baseline ? ?Depression ?   ? ?Myeloma ([place]) ?No active disease, oncology have evaluated the patient ? ? ? ?  DVT prophylaxis: Lovenox ?Code Status:   Code  Status: Full Code ?Family Communication: Daughter at bedside ?Disposition Plan: Discharge to inpatient rehabilitation in AM ? ? ?Consultants:  ?Neurology ? ?Procedures:  ?Transthoracic Echocardiogram (5/7) ? ?Antimicrobials: ?Vancomycin ?Ceftriaxone ?Acyclovir ?Ampicillin ?Keflex  ? ? ?Subjective: ?No issues noted.  ? ?Objective: ?BP (!) 157/83 (BP Location: Left Arm)   Pulse 71   Temp (!) 97.5 ?F (36.4 ?C) (Oral)   Resp 17   Wt 71 kg   LMP 05/22/2000   SpO2 100%   BMI 27.73 kg/m?  ? ?Examination: ? ?General exam: Appears calm and comfortable ?Respiratory system: Clear to auscultation. Respiratory effort normal. ?Cardiovascular system: S1 & S2 heard, RRR. No murmurs. ?Gastrointestinal system: Abdomen is nondistended, soft and nontender. Normal bowel sounds heard. ?Central nervous system: Alert. Aphasia. 4/5 upper extremity strength ?Musculoskeletal: No edema. No calf tenderness ?Skin: No cyanosis. No rashes ?Psychiatry: Judgement and insight appear normal. Mood & affect appropriate.  ? ? ?Data Reviewed: I have personally reviewed following labs and imaging studies ? ?CBC ?Lab Results  ?Component Value Date  ? WBC 7.4 09/28/2021  ? RBC 3.94 09/28/2021  ? HGB 12.4 09/28/2021  ? HCT 36.9 09/28/2021  ? MCV 93.7 09/28/2021  ? MCH 31.5 09/28/2021  ? PLT 247 09/28/2021  ? MCHC 33.6 09/28/2021  ? RDW 12.9 09/28/2021  ? LYMPHSABS 1.3 09/22/2021  ? MONOABS 0.6 09/22/2021  ? EOSABS 0.1 09/22/2021  ? BASOSABS 0.0 09/22/2021  ? ? ? ?Last metabolic panel ?Lab Results  ?Component Value Date  ? NA 139 09/28/2021  ? K 3.4 (L) 09/28/2021  ? CL 115 (H) 09/28/2021  ? CO2 17 (L) 09/28/2021  ? BUN 9 09/28/2021  ? CREATININE 1.54 (H) 09/28/2021  ? GLUCOSE 97 09/28/2021  ? GFRNONAA 35 (L) 09/28/2021  ? GFRAA 31 (L) 10/03/2019  ? CALCIUM 8.9 09/28/2021  ? PROT 6.8 09/28/2021  ? ALBUMIN 3.2 (L) 09/28/2021  ? LABGLOB 4.0 (H) 09/02/2021  ? AGRATIO 0.9 09/02/2021  ? BILITOT 0.6 09/28/2021  ? ALKPHOS 57 09/28/2021  ? AST 20 09/28/2021  ?  ALT 15 09/28/2021  ? ANIONGAP 7 09/28/2021  ? ? ?GFR: ?Estimated Creatinine Clearance: 30.7 mL/min (A) (by C-G formula based on SCr of 1.54 mg/dL (H)). ? ?Recent Results (from the past 240 hour(s))  ?Resp Panel by RT-PCR (Flu A&B, Covid) Nasopharyngeal Swab     Status: None  ? Collection Time: 09/22/21  8:37 AM  ? Specimen: Nasopharyngeal Swab; Nasopharyngeal(NP) swabs in vial transport medium  ?Result Value Ref Range Status  ? SARS Coronavirus 2 by RT PCR NEGATIVE NEGATIVE Final  ?  Comment: (NOTE) ?SARS-CoV-2 target nucleic acids are NOT DETECTED. ? ?The SARS-CoV-2 RNA is generally detectable in upper respiratory ?specimens during the acute phase of infection. The lowest ?concentration of SARS-CoV-2 viral copies this assay can detect is ?138 copies/mL. A negative result does not preclude SARS-Cov-2 ?infection and should not be used as the sole basis for treatment or ?other patient management decisions. A negative result may occur with  ?improper specimen collection/handling, submission of specimen other ?than nasopharyngeal swab, presence of viral mutation(s) within the ?areas targeted by this assay, and inadequate number of viral ?copies(<138 copies/mL). A negative result must be combined with ?clinical observations, patient history, and epidemiological ?information. The expected result is Negative. ? ?Fact Sheet for Patients:  ?EntrepreneurPulse.com.au ? ?Fact Sheet for Healthcare Providers:  ?IncredibleEmployment.be ? ?This test is no t yet approved or cleared by the [place] and  ?  has been authorized for detection and/or diagnosis of SARS-CoV-2 by ?FDA under an Emergency Use Authorization (EUA). This EUA will remain  ?in effect (meaning this test can be used) for the duration of the ?COVID-19 declaration under Section 564(b)(1) of the Act, 21 ?U.S.C.section 360bbb-3(b)(1), unless the authorization is terminated  ?or revoked sooner.  ? ? ?  ? Influenza A by PCR  NEGATIVE NEGATIVE Final  ? Influenza B by PCR NEGATIVE NEGATIVE Final  ?  Comment: (NOTE) ?The Xpert Xpress SARS-CoV-2/FLU/RSV plus assay is intended as an aid ?in the diagnosis of influenza from Nasopharyngeal swab

## 2021-09-28 NOTE — TOC Progression Note (Signed)
Transition of Care (TOC) - Progression Note  ? ? ?Patient Details  ?Name: [name] [place] ?MRN: [id number] ?Date of Birth: 06/24/1945 ? ?Transition of Care (TOC) CM/SW Contact  ?Pollie Friar, RN ?Phone Number: ?09/28/2021, 3:19 PM ? ?Clinical Narrative:    ?[facility] has received authorization for rehab. They are asking for a tomorrow admission. CM has updated the MD and daughter.  ?TOC following. ? ? ?Expected Discharge Plan: [place] ?Barriers to Discharge: Continued Medical Work up ? ?Expected Discharge Plan and Services ?Expected Discharge Plan: [place] ?  ?Discharge Planning Services: CM Consult ?Post Acute Care Choice: IP Rehab ?Living arrangements for the past 2 months: [place] ?                ?  ?  ?  ?  ?  ?  ?  ?  ?  ?  ? ? ?Social Determinants of Health (SDOH) Interventions ?  ? ?Readmission Risk Interventions ?   ? View : No data to display.  ?  ?  ?  ? ? ?

## 2021-09-28 NOTE — Progress Notes (Signed)
Inpatient Rehabilitation Admissions Coordinator  ? ?Family prefers [facility] and they are Corporate treasurer. We will not pursue AIR admit here at [facility]. ? ?Danne Baxter, RN, MSN ?Rehab Admissions Coordinator ?[phone] ?09/28/2021 9:10 AM ? ?

## 2021-09-28 NOTE — Progress Notes (Signed)
Physical Therapy Treatment ?Patient Details ?Name: [name] ?MRN: [id number] ?DOB: 15-Feb-1946 ?Today's Date: 09/28/2021 ? ? ?History of Present Illness [name] is a 76 y.o. female who presents from ED s/p waking up with confusion, generalized weakness, unsteady gait with difficulty walking, aphasia with slurred speech. MRI showing edema in L occipital parietal lobe, stroke workup ongoing. PMH: anemia, osteoarthritis, breast cancer, multiple myeloma, history of radiation therapy, , type II DM, E. 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 ?  ?PT Comments  ? ? Pt continues to have severe R sided neglect despite using L and R UE/LE equally. Pt remains mostly non-verbal but attempting to vocalize. Pt with improved ambulation tolerance today. Pt to continue to benefit from AIR upon d/c, aware pt's daughter interested in [facility]. Acute PT to cont to follow.  ?  ?Recommendations for follow up therapy are one component of a multi-disciplinary discharge planning process, led by the attending physician.  Recommendations may be updated based on patient status, additional functional criteria and insurance authorization. ? ?Follow Up Recommendations ? Acute inpatient rehab (3hours/day) ?  ?  ?Assistance Recommended at Discharge Frequent or constant Supervision/Assistance  ?Patient can return home with the following A lot of help with walking and/or transfers;A lot of help with bathing/dressing/bathroom;Direct supervision/assist for medications management;Direct supervision/assist for financial management;Assist for transportation;Help with stairs or ramp for entrance;Assistance with cooking/housework ?  ?Equipment Recommendations ? Rolling walker (2 wheels)  ?  ?Recommendations for Other Services Rehab consult ? ? ?  ?Precautions / Restrictions Precautions ?Precautions: Fall ?Precaution  Comments: R inattention, incontinence, garbled speech ?Restrictions ?Weight Bearing Restrictions: No  ?  ? ?Mobility ? Bed Mobility ?Overal bed mobility: Needs Assistance ?Bed Mobility: Supine to Sit ?  ?  ?Supine to sit: Mod assist, Max assist ?  ?  ?General bed mobility comments: due to having to transfer to EOB on the R side pt required max tactile and verbal cues to come to R side due to neglect ?  ? ?Transfers ?Overall transfer level: Needs assistance ?Equipment used: 1 person hand held assist ?Transfers: Sit to/from Stand ?Sit to Stand: Mod assist ?  ?Step pivot transfers: Mod assist ?  ?  ?  ?General transfer comment: pt does not initiate movement to verbal cues only and requires tactile cues. pt able to stand with minA once posterior cue to shoulder given bringing pt's trunk forward and pt will then lean forwards and push self up into standing, pt very restistant despite multimodal cues when attempting std pvt towards the R however when transferring to the L pt able to step with tactile cues at the hips for weight shifting ?  ? ?Ambulation/Gait ?Ambulation/Gait assistance: Mod assist, Max assist, +2 safety/equipment ?Gait Distance (Feet): 60 Feet (x1, 40x1) ?Assistive device: 1 person hand held assist ?Gait Pattern/deviations: Step-to pattern, Step-through pattern, Decreased stride length, Trunk flexed ?Gait velocity: decreased ?  ?  ?General Gait Details: PT provided pt with bilat HHA anteriorly (face to face) to allow for PT to control/promote anterior movement and weight shifting L/R to help with sequencing steps. pt with noted decreased step length and height on the R ? ? ?Stairs ?  ?  ?  ?  ?  ? ? ?Wheelchair Mobility ?  ? ?Modified Rankin (Stroke Patients Only) ?Modified Rankin (Stroke Patients Only) ?Pre-Morbid Rankin Score: Moderate disability ?Modified Rankin:  Moderately severe disability ? ? ?  ?Balance Overall balance assessment: Needs assistance ?Sitting-balance support: Feet supported, No upper  extremity supported ?Sitting balance-Leahy Scale: Good ?Sitting balance - Comments: can sit up in chair without back support ?  ?Standing balance support: Single extremity supported, During functional activity ?Standing balance-Leahy Scale: Poor ?Standing balance comment: pt dependent on external assist ?  ?  ?  ?  ?  ?  ?  ?  ?  ?  ?  ?  ? ?  ?Cognition Arousal/Alertness: Awake/alert ?Behavior During Therapy: Flat affect ?Overall Cognitive Status: Impaired/Different from baseline ?Area of Impairment: Attention, Following commands, Awareness, Problem solving, Safety/judgement ?  ?  ?  ?  ?  ?  ?  ?  ?  ?Current Attention Level: Focused ?Memory: Decreased short-term memory ?Following Commands: Follows one step commands with increased time, Follows one step commands inconsistently ?Safety/Judgement: Decreased awareness of safety, Decreased awareness of deficits ?Awareness: Intellectual ?Problem Solving: Slow processing, Difficulty sequencing, Requires verbal cues, Requires tactile cues ?General Comments: pt remains to have no tracking to the R ?  ?  ? ?  ?Exercises   ? ?  ?General Comments General comments (skin integrity, edema, etc.): VSS on RA, assisted pt to [facility], pt dependent for hygiene ?  ?  ? ?Pertinent Vitals/Pain Pain Assessment ?Pain Assessment: Faces ?Faces Pain Scale: No hurt  ? ? ?Home Living   ?  ?  ?  ?  ?  ?  ?  ?  ?  ?   ?  ?Prior Function    ?  ?  ?   ? ?PT Goals (current goals can now be found in the care plan section) Acute Rehab PT Goals ?Patient Stated Goal: didn't state ?PT Goal Formulation: With patient/family ?Time For Goal Achievement: 10/10/21 ?Potential to Achieve Goals: Good ?Progress towards PT goals: Progressing toward goals ? ?  ?Frequency ? ? ? Min 4X/week ? ? ? ?  ?PT Plan Current plan remains appropriate  ? ? ?Co-evaluation   ?  ?  ?  ?  ? ?  ?AM-PAC PT "6 Clicks" Mobility   ?Outcome Measure ? Help needed turning from your back to your side while in a flat bed without using  bedrails?: A Little ?Help needed moving from lying on your back to sitting on the side of a flat bed without using bedrails?: A Little ?Help needed moving to and from a bed to a chair (including a wheelchair)?: A Lot ?Help needed standing up from a chair using your arms (e.g., wheelchair or bedside chair)?: A Lot ?Help needed to walk in hospital room?: A Lot ?Help needed climbing 3-5 steps with a railing? : A Lot ?6 Click Score: 14 ? ?  ?End of Session Equipment Utilized During Treatment: Gait belt ?Activity Tolerance: Patient tolerated treatment well ?Patient left: in chair;with call bell/phone within reach;with family/visitor present ?Nurse Communication: Mobility status ?PT Visit Diagnosis: Unsteadiness on feet (R26.81);Muscle weakness (generalized) (M62.81);Difficulty in walking, not elsewhere classified (R26.2) ?  ? ? ?Time: 7494-4967 ?PT Time Calculation (min) (ACUTE ONLY): 40 min ? ?Charges:  $Gait Training: 23-37 mins ?$Therapeutic Activity: 8-22 mins          ?          ? ?Kittie Plater, PT, DPT ?Acute Rehabilitation Services ?Secure chat preferred ?Office #: [phone] ? ? ? ?Olayinka Gathers M Wynell Halberg ?09/28/2021, 1:56 PM ? ?

## 2021-09-29 DIAGNOSIS — I639 Cerebral infarction, unspecified: Secondary | ICD-10-CM | POA: Diagnosis not present

## 2021-09-29 LAB — CULTURE, BLOOD (ROUTINE X 2)
Culture: NO GROWTH
Culture: NO GROWTH
Special Requests: ADEQUATE

## 2021-09-29 MED ORDER — SODIUM BICARBONATE 650 MG PO TABS: 650.0000 mg | ORAL_TABLET | Freq: Three times a day (TID) | ORAL | Status: AC

## 2021-09-29 NOTE — Progress Notes (Signed)
Report called to [facility] [phone] and spoke with Alfreda. All questions answered. PT to go to room # 144. Awaiting PTAR with reportedly 1200 ETA. ?

## 2021-09-29 NOTE — TOC Transition Note (Signed)
Transition of Care (TOC) - CM/SW Discharge Note ? ? ?Patient Details  ?Name: [name] ?MRN: [id number] ?Date of Birth: 1945/12/10 ? ?Transition of Care (TOC) CM/SW Contact:  ?Pollie Friar, RN ?Phone Number: ?09/29/2021, 9:50 AM ? ? ?Clinical Narrative:    ?Patient is discharging to [facility] today. PTAR set to pick patient up at 12 pm. Bedside RN updated and d/c packet is at the desk.  ? ?Number for report: [phone] ? ? ?Final next level of care: [place] ?Barriers to Discharge: No Barriers Identified ? ? ?Patient Goals and CMS Choice ?Patient states their goals for this hospitalization and ongoing recovery are:: to go back home ?CMS Medicare.gov Compare Post Acute Care list provided to:: Patient Represenative (must comment) ?Choice offered to / list presented to : Adult Children ? ?Discharge Placement ?  ?           ?  ?  ?  ?  ? ?Discharge Plan and Services ?  ?Discharge Planning Services: CM Consult ?Post Acute Care Choice: IP Rehab          ?  ?  ?  ?  ?  ?  ?  ?  ?  ?  ? ?Social Determinants of Health (SDOH) Interventions ?  ? ? ?Readmission Risk Interventions ?   ? View : No data to display.  ?  ?  ?  ? ? ? ? ? ?

## 2021-09-29 NOTE — Discharge Summary (Signed)
?Physician Discharge Summary ?  ?Patient: [name] MRN: [id number] DOB: November 21, 1945  ?Admit date:     09/22/2021  ?Discharge date: 09/29/21  ?Discharge Physician: Cordelia Poche, MD  ? ?PCP: [name], MD  ? ?Recommendations at discharge:  ? ?Follow-up with neurology in 4 weeks ?Follow up with urology for renal mass ?Inpatient rehab on discharge ? ?Discharge Diagnoses: ?Principal Problem: ?  Acute ischemic stroke ([place]) ?Active Problems: ?  Acute metabolic encephalopathy ?  Myeloma ([place]) ?  Depression ?  Stage 3b chronic kidney disease (CKD) ([place]) ?  Amnestic MCI (mild cognitive impairment with memory loss) ?  Aphasia with TBI (traumatic brain injury), closed ?  History of multiple strokes ?  Left kidney mass ?  Hypertension ?  Hyperlipidemia ?  B12 deficiency ?  Hypokalemia ?  Metabolic acidosis ? ?Resolved Problems: ?  Acute UTI (urinary tract infection) ?  Hypocalcemia ? ?Hospital Course: ?[name] is a 76 y.o. F with brain injury with resulting cognitive impairment/dementia, diabetes, breast cancer, multiple myeloma, renal mass who presented with confusion. ? ? ?5/4: Admitted with aphasia, confusion, MRI brain normal ?5/5: EEG without definite seizure, remains confused ?5/6: Still no improvement, MRI brain repeated --> temporal lobe enhancement, Acyclovir and antibiotics started, transferred to [facility] for LP and cEEG ?5/7: LP traumatic tap ?5/8: Re-review of MRI suggests stroke, antibiotics stopped, HSV negative, acyclovir stopped, CT ordered ? ?Assessment and Plan: ?* Acute ischemic stroke ([place]) ?Initial MRI (5/4) was read as no acute stroke with severe motion artifact.  Repeat MRI (5/6) showed T2 enhancement and restricted diffusion in LEFT occipital and lateral temporal lobe, again motion artifact (this is in the setting of CTA head/neck showing LEFT PCA occlusion or severe stenosis, penumbra in the left PCA distribution as well as severe right P2 PCA stenosis). LDL of 71. Hemoglobin A1C of 5.4%.  Transthoracic Echocardiogram significant for no evidence of thrombus orA atrial level shunt. Neurology recommendations for dual antiplatelet therapy with Aspirin 81 mg and Plavix 75 mg for three months, followed by Plavix monotherapy. PT/OT recommendations for inpatient rehab. Speech therapy recommendations for continued language/cognitition therapy. Patient discharged on Aspirin 81 mg (for three months), Plavix 75 mg daily and increased dose of Crestor 20 mg daily. Recommendation for Neurology follow-up in 4 weeks.  ? ?Acute metabolic encephalopathy ?At baseline uses cane, independent with feeding/toileting, interactive and recognizes family members and engages in conversation. On presentation was confused, disoriented. 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 LP under fluoroscopy was traumatic, and low volume, but did not have a pleocytosis to suggest infection, so antibiotics were stopped. HSV PCR negative so acyclovir stopped. LTM EEG without evidence of seizures. Urine sample suggestive of UTI which could have been the cause of encephalopathy. UTI was treated successfully with antibiotics. Patient with improvement towards baseline. ? ?Metabolic acidosis ?Non-anion gap.  No diarrhea.  VBG without acidosis. Likely related to kidney disease. Discharged with sodium bicarbonate. Recheck BMP. ? ?Hypokalemia ?Repleted and resolved ? ? ?B12 deficiency ?Vitamin B12 of 143. Continue vitamin B12 supplementation. ? ?Hyperlipidemia ?Increase to Crestor 20 mg daily. ? ?Hypertension ?Slightly uncontrolled. Continue amlodipine, atenolol. ? ?Left kidney mass ?Outpatient urology follow-up referral already placed. ? ?Aphasia with TBI (traumatic brain injury), closed ?  ? ?Amnestic MCI (mild cognitive impairment with memory loss) ?Continue  donepezil, Megace,  memantine ? ?Stage 3b chronic kidney disease (CKD) ([place]) ?Baseline creatinine of about 1.4-1.5. Creatinine stable relative to baseline ? ?Depression ?   ? ?Myeloma ([place]) ?No active disease, oncology have evaluated the patient ? ?Hypocalcemia-resolved as of 09/29/2021 ?Repleted and resolved. ? ?Acute UTI (urinary tract infection)-resolved as of 09/29/2021 ?Urine culture growing E. Coli.  Completed 5 days cephalosporin.  Afebrile, HR and WBC normal. ? ? ? ? ?  ? ? ?Consultants: Neurology ?Procedures performed: LP, Transthoracic Echocardiogram   ?Disposition: Rehabilitation facility ?Diet recommendation:  ?Dysphagia 3 (Mech soft);Thin liquid  ?  ?Medication Administration: Crushed with puree ?Supervision: Staff to assist with self feeding;Full supervision/cueing for compensatory strategies ?Compensations: Slow rate;Small sips/bites ?Postural Changes: Seated upright at 90 degrees ? ?DISCHARGE MEDICATION: ?Allergies as of 09/29/2021   ? ?   Reactions  ? Ace Inhibitors Other (See Comments)  ? UNK reaction  ? Lisinopril Other (See Comments), Cough  ? Throat itching  ? ?  ? ?  ?Medication List  ?  ? ?STOP taking these medications   ? ?omeprazole 40 MG capsule ?Commonly known as: PRILOSEC ?Replaced by: pantoprazole 40 MG tablet ?  ?traMADol 50 MG tablet ?Commonly known as: ULTRAM ?  ? ?  ? ?TAKE these medications   ? ?acetaminophen 500 MG tablet ?Commonly known as: TYLENOL ?Take 500 mg by mouth every 6 (six) hours as needed for mild pain. ?  ?amLODipine 10 MG tablet ?Commonly known as: NORVASC ?Take 1 tablet (10 mg total) by mouth daily. Patient needs appointment for  further refills ?  ?aspirin 81 MG EC tablet ?Take 1 tablet (81 mg total) by mouth daily. Swallow whole. Take for 90 days only. End date of 12/28/2021 ?What changed: additional instructions ?  ?atenolol 100 MG tablet ?Commonly known as: TENORMIN ?TAKE 1 TABLET BY MOUTH EVERY DAY ?What changed:  ?how much to take ?how to take this ?when to take  this ?additional instructions ?  ?clopidogrel 75 MG tablet ?Commonly known as: PLAVIX ?Take 1 tablet (75 mg total) by mouth daily. ?  ?donepezil 5 MG tablet ?Commonly known as: ARICEPT ?Take 1 tablet (5 mg total) by mouth at bedtime. ?  ?doxylamine (Sleep) 25 MG tablet ?Commonly known as: UNISOM ?Take 25 mg by mouth at bedtime as needed for sleep. ?  ?loperamide 2 MG capsule ?Commonly known as: IMODIUM ?Take 2 mg by mouth daily as needed for diarrhea or loose stools. ?  ?megestrol 40 MG tablet ?Commonly known as: MEGACE ?Take 40 mg by mouth daily. ?  ?memantine 5 MG tablet ?Commonly known as: NAMENDA ?TAKE 1 TAB AT BEDTIME FOR 1 WEEK THEN INCREASE TO 1 TAB TWICE DAILY THEREAFTER ?What changed:  ?how much to take ?how to take this ?when to take this ?additional instructions ?  ?pantoprazole 40 MG tablet ?Commonly known as: PROTONIX ?Take 1 tablet (40 mg total) by mouth daily. ?Replaces: omeprazole 40 MG capsule ?  ?rosuvastatin 20 MG tablet ?Commonly known as: CRESTOR ?Take 1 tablet (20 mg total) by mouth daily. ?What changed:  ?how much to take ?how to take this ?when to take this ?additional instructions ?  ?sodium bicarbonate 650 MG tablet ?Take 1 tablet (650 mg total) by mouth 3 (three) times daily for 5 days. ?  ?vitamin B-12 1000 MCG tablet ?Commonly known as: CYANOCOBALAMIN ?Take 1 tablet (1,000 mcg total) by mouth daily. ?  ? ?  ? ? Follow-up Information   ? ? [name], MD. Schedule an appointment as soon as possible  for a visit in 1 week(s).   ?Specialty: Internal Medicine ?Why: For hospital follow-up ?Contact information: ?[address] ?[place] [address] ?[phone] ? ? ?  ?  ? ? Garvin Fila, MD. Schedule an appointment as soon as possible for a visit in 4 week(s).   ?Specialties: Neurology, Radiology ?Why: For hospital follow-up ?Contact information: ?[address] ?[address] ?[place] [address] ?[phone] ? ? ?  ?  ? ?  ?  ? ?  ? ?Discharge Exam: ?BP (!) 166/67 (BP Location: Right  Arm)   Pulse 62   Temp 98 ?F (36.7 ?C)   Resp (!) 21   Wt 71 kg   LMP 05/22/2000   SpO2 98%   BMI 27.73 kg/m?  ? ?General: Well appearing, no distress ? ?Condition at discharge: stable ? ?The results of significant

## 2021-09-29 NOTE — Care Management Important Message (Signed)
Important Message ? ?Patient Details  ?Name: [name] [place] ?MRN: [id number] ?Date of Birth: 04/24/1946 ? ? ?Medicare Important Message Given:  Yes ? ? ? ? ?Luzmaria Devaux ?09/29/2021, 3:43 PM ?

## 2021-09-29 NOTE — Discharge Instructions (Addendum)
[facility], ? ?You were in the hospital and found to have a stroke. You were also found to have a urinary tract infection (treated with antibiotics) and vitamin B12 deficiency (supplementation recommended). You have been started on a combination of aspirin and Plavix for 3 months but will continue Plavix indefinitely. You will be receiving rehabilitation at an inpatient facility to help with maximum potential for improvement back towards previous baseline. ?

## 2021-10-18 ENCOUNTER — Encounter: Payer: Self-pay | Admitting: Hematology & Oncology

## 2021-11-19 DEATH — deceased

## 2021-11-21 ENCOUNTER — Telehealth: Payer: Self-pay | Admitting: Adult Health

## 2021-11-21 NOTE — Telephone Encounter (Signed)
So sorry to hear that. We will send a condolence card.

## 2021-11-21 NOTE — Telephone Encounter (Signed)
I am so sorry for her loss.  Her mother was a special person with a kind heart, fighting Breast cancer and kidney tumor at the same time . My condolences,  Larey Seat, MD

## 2021-11-21 NOTE — Telephone Encounter (Signed)
Pt's daughter, Gardner Candle notified [facility] pt has passed away.  Offered Ms. Amedeo Plenty our condolences

## 2021-11-23 ENCOUNTER — Inpatient Hospital Stay: Payer: Medicare Other | Admitting: Neurology

## 2021-11-25 ENCOUNTER — Telehealth: Payer: Self-pay

## 2021-11-25 NOTE — Telephone Encounter (Signed)
Per Dr Marin Olp, pt passed away. Flowers ordered for pt's daughter per MD request. dph

## 2021-11-28 ENCOUNTER — Encounter: Payer: Self-pay | Admitting: Hematology & Oncology

## 2021-12-01 ENCOUNTER — Ambulatory Visit: Payer: Medicare Other | Admitting: Adult Health

## 2021-12-19 ENCOUNTER — Encounter: Payer: Self-pay | Admitting: Hematology & Oncology

## 2022-02-15 ENCOUNTER — Encounter: Payer: Self-pay | Admitting: Hematology & Oncology

## 2022-02-24 ENCOUNTER — Encounter: Payer: Self-pay | Admitting: Hematology & Oncology

## 2022-03-03 ENCOUNTER — Inpatient Hospital Stay: Payer: Medicare Other | Admitting: Hematology & Oncology

## 2022-03-03 ENCOUNTER — Inpatient Hospital Stay: Payer: Medicare Other
